# Patient Record
Sex: Male | Born: 1965 | Race: Black or African American | Hispanic: No | Smoking: Never smoker
Health system: Southern US, Community
[De-identification: ages and names within clinical notes are randomized; demographics above are authoritative.]

## PROBLEM LIST (undated history)

## (undated) ENCOUNTER — Emergency Department (HOSPITAL_COMMUNITY): Admission: EM | Payer: Medicaid Other

## (undated) DIAGNOSIS — G8929 Other chronic pain: Secondary | ICD-10-CM

## (undated) DIAGNOSIS — R0600 Dyspnea, unspecified: Secondary | ICD-10-CM

## (undated) DIAGNOSIS — I2510 Atherosclerotic heart disease of native coronary artery without angina pectoris: Secondary | ICD-10-CM

## (undated) DIAGNOSIS — I10 Essential (primary) hypertension: Secondary | ICD-10-CM

## (undated) DIAGNOSIS — F419 Anxiety disorder, unspecified: Secondary | ICD-10-CM

## (undated) DIAGNOSIS — M549 Dorsalgia, unspecified: Secondary | ICD-10-CM

## (undated) DIAGNOSIS — R519 Headache, unspecified: Secondary | ICD-10-CM

## (undated) DIAGNOSIS — E119 Type 2 diabetes mellitus without complications: Secondary | ICD-10-CM

## (undated) DIAGNOSIS — I209 Angina pectoris, unspecified: Secondary | ICD-10-CM

## (undated) DIAGNOSIS — M1990 Unspecified osteoarthritis, unspecified site: Secondary | ICD-10-CM

## (undated) DIAGNOSIS — G709 Myoneural disorder, unspecified: Secondary | ICD-10-CM

## (undated) HISTORY — PX: TONSILLECTOMY: SUR1361

## (undated) HISTORY — PX: SHOULDER SURGERY: SHX246

## (undated) HISTORY — PX: BACK SURGERY: SHX140

## (undated) HISTORY — DX: Essential (primary) hypertension: I10

## (undated) HISTORY — PX: CARDIAC CATHETERIZATION: SHX172

---

## 2003-11-04 ENCOUNTER — Encounter: Admission: RE | Admit: 2003-11-04 | Discharge: 2003-11-04 | Payer: Self-pay | Admitting: Neurosurgery

## 2003-11-25 ENCOUNTER — Encounter: Admission: RE | Admit: 2003-11-25 | Discharge: 2003-11-25 | Payer: Self-pay | Admitting: Neurosurgery

## 2003-12-30 ENCOUNTER — Inpatient Hospital Stay (HOSPITAL_COMMUNITY): Admission: RE | Admit: 2003-12-30 | Discharge: 2003-12-30 | Payer: Self-pay | Admitting: Neurosurgery

## 2004-01-26 ENCOUNTER — Inpatient Hospital Stay (HOSPITAL_COMMUNITY): Admission: RE | Admit: 2004-01-26 | Discharge: 2004-01-29 | Payer: Self-pay | Admitting: Neurosurgery

## 2004-03-07 ENCOUNTER — Encounter: Admission: RE | Admit: 2004-03-07 | Discharge: 2004-03-07 | Payer: Self-pay | Admitting: Neurosurgery

## 2004-04-18 ENCOUNTER — Encounter: Admission: RE | Admit: 2004-04-18 | Discharge: 2004-04-18 | Payer: Self-pay | Admitting: Neurosurgery

## 2004-08-29 ENCOUNTER — Encounter: Admission: RE | Admit: 2004-08-29 | Discharge: 2004-08-29 | Payer: Self-pay | Admitting: Neurosurgery

## 2005-05-07 ENCOUNTER — Emergency Department (HOSPITAL_COMMUNITY): Admission: EM | Admit: 2005-05-07 | Discharge: 2005-05-07 | Payer: Self-pay | Admitting: Family Medicine

## 2018-06-28 ENCOUNTER — Emergency Department (HOSPITAL_COMMUNITY)
Admission: EM | Admit: 2018-06-28 | Discharge: 2018-06-28 | Disposition: A | Discharge status: Home / Self Care | Payer: Self-pay | Attending: Emergency Medicine | Admitting: Emergency Medicine

## 2018-06-28 ENCOUNTER — Encounter (HOSPITAL_COMMUNITY): Payer: Self-pay | Admitting: Emergency Medicine

## 2018-06-28 ENCOUNTER — Other Ambulatory Visit: Payer: Self-pay

## 2018-06-28 ENCOUNTER — Emergency Department (HOSPITAL_COMMUNITY): Payer: Self-pay

## 2018-06-28 DIAGNOSIS — B9789 Other viral agents as the cause of diseases classified elsewhere: Secondary | ICD-10-CM

## 2018-06-28 DIAGNOSIS — J069 Acute upper respiratory infection, unspecified: Secondary | ICD-10-CM | POA: Insufficient documentation

## 2018-06-28 DIAGNOSIS — I2510 Atherosclerotic heart disease of native coronary artery without angina pectoris: Secondary | ICD-10-CM | POA: Insufficient documentation

## 2018-06-28 DIAGNOSIS — E119 Type 2 diabetes mellitus without complications: Secondary | ICD-10-CM | POA: Insufficient documentation

## 2018-06-28 HISTORY — DX: Type 2 diabetes mellitus without complications: E11.9

## 2018-06-28 HISTORY — DX: Atherosclerotic heart disease of native coronary artery without angina pectoris: I25.10

## 2018-06-28 LAB — CBC WITH DIFFERENTIAL/PLATELET
Abs Immature Granulocytes: 0.03 10*3/uL (ref 0.00–0.07)
Basophils Absolute: 0 10*3/uL (ref 0.0–0.1)
Basophils Relative: 0 %
Eosinophils Absolute: 0.1 10*3/uL (ref 0.0–0.5)
Eosinophils Relative: 1 %
HCT: 44.8 % (ref 39.0–52.0)
Hemoglobin: 15.7 g/dL (ref 13.0–17.0)
Immature Granulocytes: 0 %
Lymphocytes Relative: 31 %
Lymphs Abs: 3.3 10*3/uL (ref 0.7–4.0)
MCH: 31.5 pg (ref 26.0–34.0)
MCHC: 35 g/dL (ref 30.0–36.0)
MCV: 90 fL (ref 80.0–100.0)
Monocytes Absolute: 0.7 10*3/uL (ref 0.1–1.0)
Monocytes Relative: 6 %
Neutro Abs: 6.5 10*3/uL (ref 1.7–7.7)
Neutrophils Relative %: 62 %
Platelets: 364 10*3/uL (ref 150–400)
RBC: 4.98 MIL/uL (ref 4.22–5.81)
RDW: 13.2 % (ref 11.5–15.5)
WBC: 10.6 10*3/uL — ABNORMAL HIGH (ref 4.0–10.5)
nRBC: 0 % (ref 0.0–0.2)

## 2018-06-28 LAB — BASIC METABOLIC PANEL
Anion gap: 5 (ref 5–15)
BUN: 7 mg/dL (ref 6–20)
CO2: 26 mmol/L (ref 22–32)
Calcium: 9.4 mg/dL (ref 8.9–10.3)
Chloride: 106 mmol/L (ref 98–111)
Creatinine, Ser: 0.67 mg/dL (ref 0.61–1.24)
GFR calc Af Amer: >60 mL/min (ref >60)
GFR calc non Af Amer: >60 mL/min (ref >60)
Glucose, Bld: 103 mg/dL — ABNORMAL HIGH (ref 70–99)
Potassium: 3.9 mmol/L (ref 3.5–5.1)
Sodium: 137 mmol/L (ref 135–145)

## 2018-06-28 LAB — INFLUENZA PANEL BY PCR (TYPE A & B)
Influenza A By PCR: NEGATIVE
Influenza B By PCR: NEGATIVE

## 2018-06-28 LAB — CBG MONITORING, ED: Glucose-Capillary: 95 mg/dL (ref 70–99)

## 2018-06-28 NOTE — ED Notes (Addendum)
Spoke with [facility] to confirm why flu swab results were delayed. Lab personnel sts "well its the weekend and theres only one person down here." -- lab specimen was tubed to lab at 9:31. Vernona Rieger, PA made aware.

## 2018-06-28 NOTE — ED Provider Notes (Signed)
[facility] EMERGENCY DEPARTMENT Provider Note   CSN: [id number] Arrival date & time: 06/28/18  0909     History   Chief Complaint Chief Complaint  Patient presents with  . Cough    HPI Scott Tucker is a 53 y.o. male.  53 year old male brought in by significant other for cough x1 month, reports intermittent fevers for the past week with temp max 101.  Also reports chills and body aches.  Patient's wife was sick with a cold a month ago otherwise no known sick contacts.  Patient has a history of hypertension, is not compliant with medication, history of diabetes, last blood sugar check was this morning at 130.  Patient states that he feels short of breath after prolonged coughing episode otherwise denies shortness of breath or wheezing, denies nausea, vomiting, changes in bowel or bladder habits.  Patient has been taking OTC cold medication with limited relief.  No other complaints or concerns.  Patient denies tobacco use, does smoke marijuana, does not vape.     Past Medical History:  Diagnosis Date  . Coronary artery disease   . Diabetes mellitus without complication (HCC)     There are no active problems to display for this patient.   Past Surgical History:  Procedure Laterality Date  . BACK SURGERY    . SHOULDER SURGERY          Home Medications    Prior to Admission medications   Not on File    Family History No family history on file.  Social History Social History   Tobacco Use  . Smoking status: Never Smoker  Substance Use Topics  . Alcohol use: Yes    Comment: occasional  . Drug use: Yes    Types: Marijuana     Allergies   Patient has no known allergies.   Review of Systems Review of Systems  Constitutional: Positive for chills and fever.  HENT: Negative for congestion and sore throat.   Respiratory: Positive for cough. Negative for shortness of breath and wheezing.   Cardiovascular: Negative for chest pain.   Gastrointestinal: Negative for abdominal pain.  Musculoskeletal: Positive for arthralgias and myalgias.  Skin: Negative for rash and wound.  Allergic/Immunologic: Positive for immunocompromised state.  Hematological: Negative for adenopathy. Does not bruise/bleed easily.  Psychiatric/Behavioral: Negative for confusion.  All other systems reviewed and are negative.    Physical Exam Updated Vital Signs BP (!) 140/96 (BP Location: Right Arm)   Pulse 76   Temp 98.3 F (36.8 C) (Oral)   Resp 20   SpO2 99%   Physical Exam Vitals signs and nursing note reviewed.  Constitutional:      General: He is not in acute distress.    Appearance: He is well-developed. He is not diaphoretic.  HENT:     Head: Normocephalic and atraumatic.     Right Ear: Tympanic membrane and ear canal normal.     Left Ear: Tympanic membrane and ear canal normal.     Nose: Nose normal.     Mouth/Throat:     Mouth: Mucous membranes are moist.  Eyes:     Conjunctiva/sclera: Conjunctivae normal.  Cardiovascular:     Rate and Rhythm: Normal rate and regular rhythm.     Pulses: Normal pulses.     Heart sounds: Normal heart sounds.  Pulmonary:     Effort: Pulmonary effort is normal.     Breath sounds: Normal breath sounds. No wheezing.  Skin:    General:  Skin is warm and dry.     Findings: No erythema or rash.  Neurological:     Mental Status: He is alert and oriented to person, place, and time.  Psychiatric:        Behavior: Behavior normal.      ED Treatments / Results  Labs (all labs ordered are listed, but only abnormal results are displayed) Labs Reviewed  BASIC METABOLIC PANEL - Abnormal; Notable for the following components:      Result Value   Glucose, Bld 103 (*)    All other components within normal limits  CBC WITH DIFFERENTIAL/PLATELET - Abnormal; Notable for the following components:   WBC 10.6 (*)    All other components within normal limits  INFLUENZA PANEL BY PCR (TYPE A & B)   CBG MONITORING, ED    EKG None  Radiology Dg Chest 2 View  Result Date: 06/28/2018 CLINICAL DATA:  Shortness of breath and cough EXAM: CHEST - 2 VIEW COMPARISON:  December 27, 2003 FINDINGS: Lungs are clear. Heart size and pulmonary vascularity are normal. No adenopathy. There are foci of degenerative change in the thoracic spine. IMPRESSION: No edema or consolidation. Electronically Signed   By: Bretta Bang III M.D.   On: 06/28/2018 11:53    Procedures Procedures (including critical care time)  Medications Ordered in ED Medications - No data to display   Initial Impression / Assessment and Plan / ED Course  I have reviewed the triage vital signs and the nursing notes.  Pertinent labs & imaging results that were available during my care of the patient were reviewed by me and considered in my medical decision making (see chart for details).  Clinical Course as of Jun 29 1519  Sun Jun 28, 2018  5342 53 year old male presents with complaint of cough x1 month with intermittent fevers with max temp of 101.  Also reports chills and body aches developing over the past week.  On exam patient appears to feel unwell, lung sounds are clear.  Chest x-ray is unremarkable, flu swab is negative. Due to complaint of cough x 1 month with intermittent fevers, recommends labs (CBC and BMP). Lab delay on flu swab resulted in prolonged ER visit, patient is agreeable to lab draw but does not want to stay fore results. Patient recently moved to the area, PCP is in [place]. Advised patient to follow up, given local PCP.    [LM]  1520 Contacted patient, identity verified and given his lab results.  Again recommend he follow-up with local PCP.  Patient verbalizes understanding.   [LM]    Clinical Course User Index [LM] Jeannie Fend, PA-C   Final Clinical Impressions(s) / ED Diagnoses   Final diagnoses:  Viral URI with cough    ED Discharge Orders    None       Jeannie Fend,  PA-C 06/28/18 1521    Virgina Norfolk, DO 06/28/18 1523

## 2018-06-28 NOTE — ED Notes (Signed)
Patient transported to X-ray 

## 2018-06-28 NOTE — Discharge Instructions (Addendum)
Home to rest.  Take Coricidin HBP for cough symptoms.  Follow-up with primary care provider, referral given for local primary care if needed.

## 2018-06-28 NOTE — ED Notes (Signed)
Patient verbalizes understanding of discharge instructions. Opportunity for questioning and answers were provided. Armband removed by staff, pt discharged from ED.  

## 2018-06-28 NOTE — ED Triage Notes (Signed)
Pt reports cough that is sometimes productive, reports some post nasal drip as well x2 weeks. Denies any other associated symptoms. Taking some cold/allergy medicine without relief.

## 2018-08-03 ENCOUNTER — Other Ambulatory Visit: Payer: Self-pay

## 2018-08-03 ENCOUNTER — Emergency Department (HOSPITAL_COMMUNITY)
Admission: EM | Admit: 2018-08-03 | Discharge: 2018-08-03 | Disposition: A | Discharge status: Home / Self Care | Payer: Self-pay | Attending: Emergency Medicine | Admitting: Emergency Medicine

## 2018-08-03 ENCOUNTER — Encounter (HOSPITAL_COMMUNITY): Payer: Self-pay | Admitting: Emergency Medicine

## 2018-08-03 ENCOUNTER — Emergency Department (HOSPITAL_COMMUNITY): Payer: Self-pay

## 2018-08-03 DIAGNOSIS — R059 Cough, unspecified: Secondary | ICD-10-CM

## 2018-08-03 DIAGNOSIS — R05 Cough: Secondary | ICD-10-CM | POA: Insufficient documentation

## 2018-08-03 DIAGNOSIS — R079 Chest pain, unspecified: Secondary | ICD-10-CM | POA: Insufficient documentation

## 2018-08-03 DIAGNOSIS — E119 Type 2 diabetes mellitus without complications: Secondary | ICD-10-CM | POA: Insufficient documentation

## 2018-08-03 DIAGNOSIS — R0789 Other chest pain: Secondary | ICD-10-CM

## 2018-08-03 HISTORY — DX: Dorsalgia, unspecified: M54.9

## 2018-08-03 HISTORY — DX: Other chronic pain: G89.29

## 2018-08-03 LAB — CBG MONITORING, ED
Glucose-Capillary: 94 mg/dL (ref 70–99)
Glucose-Capillary: 99 mg/dL (ref 70–99)

## 2018-08-03 MED ORDER — SODIUM CHLORIDE 0.9% FLUSH: 3.0000 mL | Freq: Once | INTRAVENOUS | Status: DC

## 2018-08-03 NOTE — Discharge Instructions (Signed)
Please follow up with your doctor °Return if worsening °

## 2018-08-03 NOTE — ED Provider Notes (Signed)
[facility] EMERGENCY DEPARTMENT Provider Note   CSN: [id number] Arrival date & time: 08/03/18  1447    History   Chief Complaint Chief Complaint  Patient presents with  . Cough  . Chest Pain    HPI Scott Tucker is a 53 y.o. male presents with a cough and chest pain.  Past medical history significant for coronary artery disease, insulin-dependent diabetes, hypertension, hyperlipidemia, chronic back pain.  The patient recently moved here and has been receiving care in [place].  He states that he has had a chronic cough for the past 3 months.  It is mostly dry but productive at times.  He was seen here in the ED last month for the same.  He was told that he likely had a viral illness.  He had normal labs and a chest x-ray and was told to follow-up with his PCP which he has not done.  He states he possibly had a fever couple weeks ago but states it is only 99 degrees.  He has not had any URI symptoms.  He denies constant chest pain or exertional chest pain. The pain does not feel like anginal pain which has had in the past. He denies shortness of breath or leg swelling.  He mostly has pain when he coughs or takes deep breaths which concerned him.  He called his family doctor's office and they told him to come to the ED. He does take Lisinopril/hctz.     HPI  Past Medical History:  Diagnosis Date  . Coronary artery disease   . Diabetes mellitus without complication (HCC)     There are no active problems to display for this patient.   Past Surgical History:  Procedure Laterality Date  . BACK SURGERY    . SHOULDER SURGERY          Home Medications    Prior to Admission medications   Not on File    Family History No family history on file.  Social History Social History   Tobacco Use  . Smoking status: Never Smoker  . Smokeless tobacco: Never Used  Substance Use Topics  . Alcohol use: Yes    Comment: occasional  . Drug use: Yes    Types:  Marijuana     Allergies   Patient has no known allergies.   Review of Systems Review of Systems  Constitutional: Negative for chills and fever.  HENT: Negative for congestion, rhinorrhea and sore throat.   Respiratory: Positive for cough. Negative for shortness of breath.   Cardiovascular: Positive for chest pain (with coughing). Negative for palpitations and leg swelling.  Musculoskeletal: Positive for back pain (chronic) and myalgias.  All other systems reviewed and are negative.    Physical Exam Updated Vital Signs BP (!) 131/91 (BP Location: Right Arm)   Pulse 88   Temp 98.2 F (36.8 C) (Oral)   Resp 16   SpO2 98%   Physical Exam Vitals signs and nursing note reviewed.  Constitutional:      General: He is not in acute distress.    Appearance: He is well-developed. He is not ill-appearing.     Comments: Calm, cooperative.  Well-appearing  HENT:     Head: Normocephalic and atraumatic.     Right Ear: Tympanic membrane normal.     Left Ear: Tympanic membrane normal.     Nose: Nose normal.     Mouth/Throat:     Mouth: Mucous membranes are moist.  Eyes:  General: No scleral icterus.       Right eye: No discharge.        Left eye: No discharge.     Conjunctiva/sclera: Conjunctivae normal.     Pupils: Pupils are equal, round, and reactive to light.  Neck:     Musculoskeletal: Normal range of motion.  Cardiovascular:     Rate and Rhythm: Normal rate and regular rhythm.  Pulmonary:     Effort: Pulmonary effort is normal. No respiratory distress.     Breath sounds: Normal breath sounds.  Abdominal:     General: There is no distension.  Musculoskeletal:     Right lower leg: No edema.     Left lower leg: No edema.  Skin:    General: Skin is warm and dry.  Neurological:     Mental Status: He is alert and oriented to person, place, and time.  Psychiatric:        Behavior: Behavior normal.      ED Treatments / Results  Labs (all labs ordered are listed,  but only abnormal results are displayed) Labs Reviewed  CBG MONITORING, ED  CBG MONITORING, ED    EKG None  Radiology No results found.  Procedures Procedures (including critical care time)  Medications Ordered in ED Medications - No data to display   Initial Impression / Assessment and Plan / ED Course  I have reviewed the triage vital signs and the nursing notes.  Pertinent labs & imaging results that were available during my care of the patient were reviewed by me and considered in my medical decision making (see chart for details).  53 year old male presents with a chronic dry cough, chest pain with coughing and deep breaths.  His vital signs are normal here.  He is overall well-appearing but uncomfortable due to the cough.  EKG is sinus rhythm and has abnormalities but it appears similar to prior EKGs.  Chest x-ray is normal.  CBG here is normal.  I think it is possible that his chronic cough is due to lisinopril.  Advised following up with his doctor about this and possibly changing the medicine.  Final Clinical Impressions(s) / ED Diagnoses   Final diagnoses:  Cough  Chest wall pain    ED Discharge Orders    None       Bethel Born, PA-C 08/03/18 1657    Blane Ohara, MD 08/03/18 [phone]

## 2018-08-03 NOTE — ED Triage Notes (Signed)
Pt c/o cough x 3 months, chest pain developed with cough and lying flat x 2 weeks ago. Denies fever/shortness of breath.

## 2019-02-22 ENCOUNTER — Other Ambulatory Visit: Payer: Self-pay

## 2019-02-22 ENCOUNTER — Observation Stay (HOSPITAL_COMMUNITY)
Admission: EM | Admit: 2019-02-22 | Discharge: 2019-02-23 | Disposition: A | Discharge status: Home / Self Care | Payer: Self-pay | Attending: Interventional Cardiology | Admitting: Interventional Cardiology

## 2019-02-22 ENCOUNTER — Observation Stay (HOSPITAL_COMMUNITY): Payer: Self-pay

## 2019-02-22 ENCOUNTER — Observation Stay (HOSPITAL_BASED_OUTPATIENT_CLINIC_OR_DEPARTMENT_OTHER): Payer: Self-pay

## 2019-02-22 ENCOUNTER — Encounter (HOSPITAL_COMMUNITY): Payer: Self-pay | Admitting: Emergency Medicine

## 2019-02-22 ENCOUNTER — Emergency Department (HOSPITAL_COMMUNITY): Payer: Self-pay

## 2019-02-22 DIAGNOSIS — R079 Chest pain, unspecified: Secondary | ICD-10-CM | POA: Diagnosis present

## 2019-02-22 DIAGNOSIS — R9431 Abnormal electrocardiogram [ECG] [EKG]: Secondary | ICD-10-CM

## 2019-02-22 DIAGNOSIS — E785 Hyperlipidemia, unspecified: Secondary | ICD-10-CM

## 2019-02-22 DIAGNOSIS — E875 Hyperkalemia: Secondary | ICD-10-CM | POA: Insufficient documentation

## 2019-02-22 DIAGNOSIS — E119 Type 2 diabetes mellitus without complications: Secondary | ICD-10-CM | POA: Insufficient documentation

## 2019-02-22 DIAGNOSIS — Z20828 Contact with and (suspected) exposure to other viral communicable diseases: Secondary | ICD-10-CM | POA: Insufficient documentation

## 2019-02-22 DIAGNOSIS — I25110 Atherosclerotic heart disease of native coronary artery with unstable angina pectoris: Secondary | ICD-10-CM

## 2019-02-22 DIAGNOSIS — Z8571 Personal history of Hodgkin lymphoma: Secondary | ICD-10-CM | POA: Insufficient documentation

## 2019-02-22 DIAGNOSIS — R0789 Other chest pain: Principal | ICD-10-CM | POA: Insufficient documentation

## 2019-02-22 DIAGNOSIS — Z794 Long term (current) use of insulin: Secondary | ICD-10-CM | POA: Insufficient documentation

## 2019-02-22 DIAGNOSIS — I2510 Atherosclerotic heart disease of native coronary artery without angina pectoris: Secondary | ICD-10-CM | POA: Insufficient documentation

## 2019-02-22 DIAGNOSIS — I10 Essential (primary) hypertension: Secondary | ICD-10-CM

## 2019-02-22 DIAGNOSIS — R05 Cough: Secondary | ICD-10-CM

## 2019-02-22 DIAGNOSIS — R059 Cough, unspecified: Secondary | ICD-10-CM

## 2019-02-22 DIAGNOSIS — I119 Hypertensive heart disease without heart failure: Secondary | ICD-10-CM | POA: Insufficient documentation

## 2019-02-22 LAB — BASIC METABOLIC PANEL
Anion gap: 10 (ref 5–15)
BUN: 14 mg/dL (ref 6–20)
CO2: 25 mmol/L (ref 22–32)
Calcium: 9.3 mg/dL (ref 8.9–10.3)
Chloride: 105 mmol/L (ref 98–111)
Creatinine, Ser: 0.67 mg/dL (ref 0.61–1.24)
GFR calc Af Amer: >60 mL/min (ref >60)
GFR calc non Af Amer: >60 mL/min (ref >60)
Glucose, Bld: 99 mg/dL (ref 70–99)
Potassium: 5.9 mmol/L — ABNORMAL HIGH (ref 3.5–5.1)
Sodium: 140 mmol/L (ref 135–145)

## 2019-02-22 LAB — SARS CORONAVIRUS 2 BY RT PCR (HOSPITAL ORDER, PERFORMED IN ~~LOC~~ HOSPITAL LAB): SARS Coronavirus 2: NEGATIVE

## 2019-02-22 LAB — CBC
HCT: 44.8 % (ref 39.0–52.0)
Hemoglobin: 15.4 g/dL (ref 13.0–17.0)
MCH: 31.9 pg (ref 26.0–34.0)
MCHC: 34.4 g/dL (ref 30.0–36.0)
MCV: 92.8 fL (ref 80.0–100.0)
Platelets: 359 10*3/uL (ref 150–400)
RBC: 4.83 MIL/uL (ref 4.22–5.81)
RDW: 13.2 % (ref 11.5–15.5)
WBC: 10.1 10*3/uL (ref 4.0–10.5)
nRBC: 0 % (ref 0.0–0.2)

## 2019-02-22 LAB — TROPONIN I (HIGH SENSITIVITY)
Troponin I (High Sensitivity): 3 ng/L (ref <18)
Troponin I (High Sensitivity): 3 ng/L (ref <18)

## 2019-02-22 LAB — ECHOCARDIOGRAM COMPLETE

## 2019-02-22 LAB — CBG MONITORING, ED: Glucose-Capillary: 89 mg/dL (ref 70–99)

## 2019-02-22 LAB — HIV ANTIBODY (ROUTINE TESTING W REFLEX): HIV Screen 4th Generation wRfx: NONREACTIVE

## 2019-02-22 MED ORDER — IOHEXOL 350 MG/ML SOLN
80.0000 mL | Freq: Once | INTRAVENOUS | Status: AC | PRN
  Administered 2019-02-22: 80 mL via INTRAVENOUS

## 2019-02-22 MED ORDER — METOPROLOL TARTRATE 5 MG/5ML IV SOLN
INTRAVENOUS | Status: AC
  Filled 2019-02-22: qty 5

## 2019-02-22 MED ORDER — ATORVASTATIN CALCIUM 40 MG PO TABS
40.0000 mg | ORAL_TABLET | Freq: Every day | ORAL | Status: DC
  Administered 2019-02-22: 40 mg via ORAL
  Filled 2019-02-22: qty 1

## 2019-02-22 MED ORDER — HYDROCHLOROTHIAZIDE 12.5 MG PO CAPS: 12.5000 mg | ORAL_CAPSULE | Freq: Every day | ORAL | Status: DC

## 2019-02-22 MED ORDER — ONDANSETRON HCL 4 MG/2ML IJ SOLN: 4.0000 mg | Freq: Four times a day (QID) | INTRAMUSCULAR | Status: DC | PRN

## 2019-02-22 MED ORDER — ASPIRIN EC 81 MG PO TBEC
81.0000 mg | DELAYED_RELEASE_TABLET | Freq: Every day | ORAL | Status: DC
  Administered 2019-02-23: 81 mg via ORAL
  Filled 2019-02-22: qty 1

## 2019-02-22 MED ORDER — NITROGLYCERIN 0.4 MG SL SUBL: 0.4000 mg | SUBLINGUAL_TABLET | SUBLINGUAL | Status: DC | PRN

## 2019-02-22 MED ORDER — SODIUM CHLORIDE 0.9% FLUSH
3.0000 mL | Freq: Once | INTRAVENOUS | Status: AC
  Administered 2019-02-22: 3 mL via INTRAVENOUS

## 2019-02-22 MED ORDER — ASPIRIN 81 MG PO CHEW
324.0000 mg | CHEWABLE_TABLET | Freq: Once | ORAL | Status: AC
  Administered 2019-02-22: 11:00:00 324 mg via ORAL

## 2019-02-22 MED ORDER — SODIUM CHLORIDE 0.9 % WEIGHT BASED INFUSION: 3.0000 mL/kg/h | INTRAVENOUS | Status: AC

## 2019-02-22 MED ORDER — NON FORMULARY: 5.0000 mg | Freq: Every evening | Status: DC | PRN

## 2019-02-22 MED ORDER — LISINOPRIL 10 MG PO TABS: 10.0000 mg | ORAL_TABLET | Freq: Every day | ORAL | Status: DC

## 2019-02-22 MED ORDER — NITROGLYCERIN 0.4 MG SL SUBL
SUBLINGUAL_TABLET | SUBLINGUAL | Status: AC
  Filled 2019-02-22: qty 2

## 2019-02-22 MED ORDER — MELATONIN 3 MG PO TABS
4.5000 mg | ORAL_TABLET | Freq: Every evening | ORAL | Status: DC | PRN
  Administered 2019-02-22: 4.5 mg via ORAL
  Filled 2019-02-22 (×2): qty 1.5

## 2019-02-22 MED ORDER — SODIUM CHLORIDE 0.9% FLUSH
3.0000 mL | Freq: Two times a day (BID) | INTRAVENOUS | Status: DC
  Administered 2019-02-22 – 2019-02-23 (×2): 3 mL via INTRAVENOUS

## 2019-02-22 MED ORDER — MORPHINE SULFATE 15 MG PO TABS
15.0000 mg | ORAL_TABLET | ORAL | Status: DC | PRN
  Administered 2019-02-22 – 2019-02-23 (×3): 15 mg via ORAL
  Filled 2019-02-22 (×5): qty 1

## 2019-02-22 MED ORDER — SODIUM CHLORIDE 0.9 % WEIGHT BASED INFUSION
1.0000 mL/kg/h | INTRAVENOUS | Status: DC
  Administered 2019-02-23: 1 mL/kg/h via INTRAVENOUS

## 2019-02-22 MED ORDER — LISINOPRIL-HYDROCHLOROTHIAZIDE 10-12.5 MG PO TABS: 1.0000 | ORAL_TABLET | Freq: Every day | ORAL | Status: DC

## 2019-02-22 MED ORDER — SODIUM CHLORIDE 0.9% FLUSH: 3.0000 mL | INTRAVENOUS | Status: DC | PRN

## 2019-02-22 MED ORDER — METOPROLOL TARTRATE 50 MG PO TABS: 50.0000 mg | ORAL_TABLET | Freq: Once | ORAL | Status: DC

## 2019-02-22 MED ORDER — SODIUM CHLORIDE 0.9 % IV SOLN: 250.0000 mL | INTRAVENOUS | Status: DC | PRN

## 2019-02-22 MED ORDER — ACETAMINOPHEN 325 MG PO TABS
650.0000 mg | ORAL_TABLET | ORAL | Status: DC | PRN
  Administered 2019-02-22: 650 mg via ORAL
  Filled 2019-02-22: qty 2

## 2019-02-22 MED ORDER — ASPIRIN 81 MG PO CHEW
81.0000 mg | CHEWABLE_TABLET | ORAL | Status: AC
  Administered 2019-02-23: 81 mg via ORAL
  Filled 2019-02-22: qty 1

## 2019-02-22 NOTE — ED Triage Notes (Signed)
Pt reports pulsating CP for the last 2 days. Denies radiation of pain. Denies hx of MI. Reports wheezing at night. Smoker. Denies sick contacts, fever.

## 2019-02-22 NOTE — Progress Notes (Signed)
  Echocardiogram 2D Echocardiogram has been performed.  Scott Tucker 02/22/2019, 4:37 PM

## 2019-02-22 NOTE — ED Notes (Signed)
ED Provider at bedside. 

## 2019-02-22 NOTE — ED Provider Notes (Signed)
[facility] EMERGENCY DEPARTMENT Provider Note   CSN: [id number] Arrival date & time: 02/22/19  2423     History   Chief Complaint Chief Complaint  Patient presents with  . Chest Pain  . Cough    HPI Scott Tucker is a 53 y.o. male.     Patient with history of coronary artery disease per his report 80% blockage with balloon procedure approximately 10 years ago, supposed to be on Plavix however ran out 3 weeks ago, diabetes presents with intermittent chest pain lasting a few minutes at a time typically at rest.  Patient has not taken nitro recently.  Patient is also had productive cough.  No fevers or chills or sick contacts.  Currently no active chest pain comes and goes.  Patient does smoke marijuana.  No history of stent placement.  No current cardiologist.     Past Medical History:  Diagnosis Date  . Chronic back pain   . Coronary artery disease   . Diabetes mellitus without complication ([place])     There are no active problems to display for this patient.   Past Surgical History:  Procedure Laterality Date  . BACK SURGERY    . SHOULDER SURGERY          Home Medications    Prior to Admission medications   Not on File    Family History History reviewed. No pertinent family history.  Social History Social History   Tobacco Use  . Smoking status: Never Smoker  . Smokeless tobacco: Never Used  Substance Use Topics  . Alcohol use: Yes    Comment: occasional  . Drug use: Yes    Types: Marijuana     Allergies   Patient has no known allergies.   Review of Systems Review of Systems  Constitutional: Negative for chills and fever.  HENT: Negative for congestion.   Eyes: Negative for visual disturbance.  Respiratory: Positive for cough and shortness of breath.   Cardiovascular: Positive for chest pain. Negative for leg swelling.  Gastrointestinal: Negative for abdominal pain and vomiting.  Genitourinary: Negative for dysuria  and flank pain.  Musculoskeletal: Negative for back pain, neck pain and neck stiffness.  Skin: Negative for rash.  Neurological: Negative for light-headedness and headaches.     Physical Exam Updated Vital Signs BP (!) 148/95   Pulse (!) 25   Temp 98.8 F (37.1 C) (Oral)   Resp 14   SpO2 96%   Physical Exam Vitals signs and nursing note reviewed.  Constitutional:      Appearance: He is well-developed.  HENT:     Head: Normocephalic and atraumatic.  Eyes:     General:        Right eye: No discharge.        Left eye: No discharge.     Conjunctiva/sclera: Conjunctivae normal.  Neck:     Musculoskeletal: Normal range of motion and neck supple.     Trachea: No tracheal deviation.  Cardiovascular:     Rate and Rhythm: Normal rate and regular rhythm.  Pulmonary:     Effort: Pulmonary effort is normal.     Breath sounds: Normal breath sounds.  Abdominal:     General: There is no distension.     Palpations: Abdomen is soft.     Tenderness: There is no abdominal tenderness. There is no guarding.  Skin:    General: Skin is warm.     Findings: No rash.  Neurological:  Mental Status: He is alert and oriented to person, place, and time.      ED Treatments / Results  Labs (all labs ordered are listed, but only abnormal results are displayed) Labs Reviewed  BASIC METABOLIC PANEL - Abnormal; Notable for the following components:      Result Value   Potassium 5.9 (*)    All other components within normal limits  SARS CORONAVIRUS 2 BY RT PCR (HOSPITAL ORDER, PERFORMED IN [facility])  CBC  CBG MONITORING, ED  TROPONIN I (HIGH SENSITIVITY)  TROPONIN I (HIGH SENSITIVITY)    EKG EKG Interpretation  Date/Time:  Monday February 22 2019 10:10:38 EDT Ventricular Rate:  78 PR Interval:  148 QRS Duration: 100 QT Interval:  360 QTC Calculation: 410 R Axis:   -48 Text Interpretation:  Normal sinus rhythm Pulmonary disease pattern Incomplete right bundle  branch block Left anterior fascicular block Moderate voltage criteria for LVH, may be normal variant ( R in aVL , Cornell product ) Septal infarct , age undetermined Lateral injury pattern Confirmed by Blane Ohara [phone]) on 02/22/2019 10:17:03 AM   Radiology Dg Chest Portable 1 View  Result Date: 02/22/2019 CLINICAL DATA:  Chest pain for 2 days, cough, EXAM: PORTABLE CHEST 1 VIEW COMPARISON:  08/03/2018 FINDINGS: The heart size and mediastinal contours are within normal limits. Both lungs are clear. The visualized skeletal structures are unremarkable. IMPRESSION: No active disease. Electronically Signed   By: Donzetta Kohut M.D.   On: 02/22/2019 10:55    Procedures Procedures (including critical care time)  Medications Ordered in ED Medications  sodium chloride flush (NS) 0.9 % injection 3 mL (has no administration in time range)  aspirin chewable tablet 324 mg (324 mg Oral Given 02/22/19 1030)     Initial Impression / Assessment and Plan / ED Course  I have reviewed the triage vital signs and the nursing notes.  Pertinent labs & imaging results that were available during my care of the patient were reviewed by me and considered in my medical decision making (see chart for details).       Patient with history of coronary artery disease presents with almost 2 separate concerns.  Patient's had intermittent chest pain mild neck symptoms with it and also he said intermittent cough productive with wheezing.  EKG performed and concerning with signs of lateral and anterior strain with reciprocal pattern.  Old EKG has similar morphology and some of the leads however this is worse.  Paged cardiology to discuss further details.  Aspirin ordered.  No active chest pain right now. Discussed with cardiology Dr. Salena Saner will evaluate the patient and help with further management.  Patient chest pain-free in the ER.  Discussed with cardiology plan for admission.  General cardiac diet ordered as no  emergent cath planned.  Mild elevated potassium 5.9, normal troponin.  Chest x-ray no acute findings.   Final Clinical Impressions(s) / ED Diagnoses   Final diagnoses:  Abnormal EKG  Cough in adult    ED Discharge Orders    None       Blane Ohara, MD 02/22/19 1238

## 2019-02-22 NOTE — Plan of Care (Signed)
  Problem: Clinical Measurements: Goal: Cardiovascular complication will be avoided Outcome: Progressing   Problem: Activity: Goal: Risk for activity intolerance will decrease Outcome: Progressing   Problem: Pain Managment: Goal: General experience of comfort will improve Outcome: Progressing   Problem: Safety: Goal: Ability to remain free from injury will improve Outcome: Progressing   Problem: Skin Integrity: Goal: Risk for impaired skin integrity will decrease Outcome: Progressing   

## 2019-02-22 NOTE — Progress Notes (Signed)
Cardiac CTA was  Abnormal. I place cardiac cath orders, but did not place on board, will have rounding MD see and decide complete plan.  Pt aware.

## 2019-02-22 NOTE — Progress Notes (Signed)
MD on call notified patient requesting sleep medication. Arthor Captain LPN

## 2019-02-22 NOTE — H&P (Addendum)
H&P:   Patient ID: Scott Tucker MRN: [id number]; DOB: 09-20-1965  Admit date: 02/22/2019 Date of Consult: 02/22/2019  Primary Care Provider: System, Pcp Not In Primary Cardiologist: No primary care provider on file.  Primary Electrophysiologist:  None   Patient Profile:   Scott Tucker is a 53 y.o. male with a hx of possible CAD?, HTN, HL, DM who is being seen today for the evaluation of chest pain at the request of Dr. Reather Converse.  History of Present Illness:   Scott Tucker is a 53 yo male with PMH noted above.  He reports having a cardiac catheterization about 10 years ago possibly in [place].  He describes what sounds like an angioplasty, and was placed on aspirin and Plavix post cath.  He takes medications for hypertension, hyperlipidemia, diabetes and has a primary care provider in [place].  States he has not seen them in over a year since moving to [place].  Intermittently monitor his blood pressures at home which he reports being relatively controlled.  He is not currently taking any insulin and states his blood sugars have been relatively controlled as well.  He reports developing shortness of breath about a week ago.  Has also had associated cough that has been productive with dark mucus.  States he has had brief episodes of centralized chest pressure lasting 1 to 2 minutes at a time.  Has had 2 episodes of this over the past week.  Also reports intermittent episodes of fever temp max 101.  Has also had some right-sided neck and shoulder stiffness. Denies any sick contacts or Covid contacts.   In talking with the patient he states he is not very active at home.  Does cook and clean around his home and does not report any anginal symptoms with these activities.  In the ED his labs showed stable electrolytes with the exception of potassium of 5.9, hemoglobin 15.5, high-sensitivity troponin 3.  EKG showed sinus rhythm with LVH, T wave inversions in V5 V6 which was new from  prior EKG in March 2020.   Heart Pathway Score:     Past Medical History:  Diagnosis Date  . Chronic back pain   . Coronary artery disease   . Diabetes mellitus without complication [facility])     Past Surgical History:  Procedure Laterality Date  . BACK SURGERY    . SHOULDER SURGERY       Home Medications:  Prior to Admission medications   Not on File    Inpatient Medications: Scheduled Meds: . sodium chloride flush  3 mL Intravenous Once   Continuous Infusions:  PRN Meds:   Allergies:   No Known Allergies  Social History:   Social History   Socioeconomic History  . Marital status: Married    Spouse name: Not on file  . Number of children: Not on file  . Years of education: Not on file  . Highest education level: Not on file  Occupational History  . Not on file  Social Needs  . Financial resource strain: Not on file  . Food insecurity    Worry: Not on file    Inability: Not on file  . Transportation needs    Medical: Not on file    Non-medical: Not on file  Tobacco Use  . Smoking status: Never Smoker  . Smokeless tobacco: Never Used  Substance and Sexual Activity  . Alcohol use: Yes    Comment: occasional  . Drug use: Yes    Types: Marijuana  .  Sexual activity: Not on file  Lifestyle  . Physical activity    Days per week: Not on file    Minutes per session: Not on file  . Stress: Not on file  Relationships  . Social Musician on phone: Not on file    Gets together: Not on file    Attends religious service: Not on file    Active member of club or organization: Not on file    Attends meetings of clubs or organizations: Not on file    Relationship status: Not on file  . Intimate partner violence    Fear of current or ex partner: Not on file    Emotionally abused: Not on file    Physically abused: Not on file    Forced sexual activity: Not on file  Other Topics Concern  . Not on file  Social History Narrative  . Not on file     Family History:   History reviewed. No pertinent family history.   ROS:  Please see the history of present illness.   All other ROS reviewed and negative.     Physical Exam/Data:   Vitals:   02/22/19 1026 02/22/19 1136 02/22/19 1215 02/22/19 1315  BP: (!) 170/77 133/88 (!) 148/95 (!) 143/95  Pulse: 70 72 (!) 25 65  Resp: 20 (!) 22 14 16   Temp:      TempSrc:      SpO2: 99% 99% 96% 94%   No intake or output data in the 24 hours ending 02/22/19 1342 No flowsheet data found.   There is no height or weight on file to calculate BMI.  General:  Well nourished, well developed, in no acute distress HEENT: normal Neck: no JVD Endocrine:  No thryomegaly Vascular: No carotid bruits Cardiac:  normal S1, S2; RRR; no murmur  Lungs:  clear to auscultation bilaterally, no wheezing, rhonchi or rales  Abd: soft, nontender, no hepatomegaly  Ext: no edema Musculoskeletal:  No deformities, BUE and BLE strength normal and equal Skin: warm and dry  Neuro:  CNs 2-12 intact, no focal abnormalities noted Psych:  Normal affect   EKG:  The EKG was personally reviewed and demonstrates:  SR with LVH, new TWI in v5, v6.  Relevant CV Studies:  N/a   Laboratory Data:  High Sensitivity Troponin:   Recent Labs  Lab 02/22/19 1005 02/22/19 1205  TROPONINIHS 3 3     Chemistry Recent Labs  Lab 02/22/19 1005  NA 140  K 5.9*  CL 105  CO2 25  GLUCOSE 99  BUN 14  CREATININE 0.67  CALCIUM 9.3  GFRNONAA >60  GFRAA >60  ANIONGAP 10    No results for input(s): PROT, ALBUMIN, AST, ALT, ALKPHOS, BILITOT in the last 168 hours. Hematology Recent Labs  Lab 02/22/19 1005  WBC 10.1  RBC 4.83  HGB 15.4  HCT 44.8  MCV 92.8  MCH 31.9  MCHC 34.4  RDW 13.2  PLT 359   BNPNo results for input(s): BNP, PROBNP in the last 168 hours.  DDimer No results for input(s): DDIMER in the last 168 hours.   Radiology/Studies:  Dg Chest Portable 1 View  Result Date: 02/22/2019 CLINICAL DATA:  Chest  pain for 2 days, cough, EXAM: PORTABLE CHEST 1 VIEW COMPARISON:  08/03/2018 FINDINGS: The heart size and mediastinal contours are within normal limits. Both lungs are clear. The visualized skeletal structures are unremarkable. IMPRESSION: No active disease. Electronically Signed   By: 08/05/2018.D.  On: 02/22/2019 10:55    Assessment and Plan:   Ceasar LundBruce W Cudney is a 53 y.o. male with a hx of possible CAD?, HTN, HL, DM who is being seen today for the evaluation of chest pain at the request of Dr. Jodi MourningZavitz.  1. Chest pain: symptoms are atypical in nature, not associated with rest or exertion. Very brief in nature lasting only 1-2 mins at a time. No chest pain while in the ED. HsT negative x2. EKG showed SR with LVH but new TWI in v5-v6 compared to prior in 07/2018. Has also had a productive cough and fevers over the past week. No fever currently.  -- Will check echo for WMA and LVH.  -- would benefit from coronary CTA given possible hx of CAD. Will arrange for today.  2. Possible CAD: reports a cath about 10 years prior, describing angioplasty to vessel but no stenting. Has been on ASA/plavix since that time.   3. HTN: blood pressures are elevated in the ED, reporting he has been out of his home meds for quite sometime. Has not seen PCP in over a year since moving to [place]. List includes lisinopril/HCTZ -- will ask for Pharmacy to complete med rec of meds he has been on in the past.   4. HL: has been on statin in the past. Not currently as he ran out.  -- check lipids -- atorva 40mg  daily  5. DM: states his is insulin dependent but as above has been out. Says CBGs have been relatively controlled without meds. Glucose 99 on labs. -- check Hgb A11c  6. Hyperkalemia: K+ 5.9 in the ED. No agents prior to admission. Follow up BMET in am.  For questions or updates, please contact [facility] Please consult [email or web address] for contact info under   Signed, Laverda PageLindsay Roberts, NP   02/22/2019 1:42 PM   I have examined the patient and reviewed assessment and plan and discussed with patient.  Agree with above as stated.    Needs BP control.  CP with some atypical features.  Will check CTA coronaries.  Check echo to look for LVEF and LVH.  ECG changes are more consistent with LVH.   Will need f/u and compliance with meds to help modify risk factors.   Lance MussJayadeep Mahalia Dykes

## 2019-02-22 NOTE — ED Notes (Signed)
Zoll at bedside. Cards pending to bedside. Xray at bedside. Consent at bedside

## 2019-02-23 DIAGNOSIS — I25118 Atherosclerotic heart disease of native coronary artery with other forms of angina pectoris: Secondary | ICD-10-CM

## 2019-02-23 DIAGNOSIS — E785 Hyperlipidemia, unspecified: Secondary | ICD-10-CM

## 2019-02-23 DIAGNOSIS — E782 Mixed hyperlipidemia: Secondary | ICD-10-CM

## 2019-02-23 DIAGNOSIS — I10 Essential (primary) hypertension: Secondary | ICD-10-CM

## 2019-02-23 LAB — BASIC METABOLIC PANEL
Anion gap: 9 (ref 5–15)
BUN: 8 mg/dL (ref 6–20)
CO2: 27 mmol/L (ref 22–32)
Calcium: 9.1 mg/dL (ref 8.9–10.3)
Chloride: 105 mmol/L (ref 98–111)
Creatinine, Ser: 0.49 mg/dL — ABNORMAL LOW (ref 0.61–1.24)
GFR calc Af Amer: >60 mL/min (ref >60)
GFR calc non Af Amer: >60 mL/min (ref >60)
Glucose, Bld: 98 mg/dL (ref 70–99)
Potassium: 4.2 mmol/L (ref 3.5–5.1)
Sodium: 141 mmol/L (ref 135–145)

## 2019-02-23 LAB — LIPID PANEL
Cholesterol: 170 mg/dL (ref 0–200)
HDL: 46 mg/dL (ref >40)
LDL Cholesterol: 113 mg/dL — ABNORMAL HIGH (ref 0–99)
Total CHOL/HDL Ratio: 3.7 RATIO
Triglycerides: 56 mg/dL (ref <150)
VLDL: 11 mg/dL (ref 0–40)

## 2019-02-23 LAB — CBC
HCT: 41 % (ref 39.0–52.0)
Hemoglobin: 14.7 g/dL (ref 13.0–17.0)
MCH: 33.1 pg (ref 26.0–34.0)
MCHC: 35.9 g/dL (ref 30.0–36.0)
MCV: 92.3 fL (ref 80.0–100.0)
Platelets: 302 10*3/uL (ref 150–400)
RBC: 4.44 MIL/uL (ref 4.22–5.81)
RDW: 13.1 % (ref 11.5–15.5)
WBC: 10 10*3/uL (ref 4.0–10.5)
nRBC: 0 % (ref 0.0–0.2)

## 2019-02-23 LAB — GLUCOSE, CAPILLARY: Glucose-Capillary: 105 mg/dL — ABNORMAL HIGH (ref 70–99)

## 2019-02-23 LAB — HEMOGLOBIN A1C
Hgb A1c MFr Bld: 4.9 % (ref 4.8–5.6)
Mean Plasma Glucose: 93.93 mg/dL

## 2019-02-23 SURGERY — LEFT HEART CATH AND CORONARY ANGIOGRAPHY
Anesthesia: LOCAL

## 2019-02-23 MED ORDER — ATORVASTATIN CALCIUM 40 MG PO TABS: 40.0000 mg | ORAL_TABLET | Freq: Every day | ORAL | 0 refills | Status: DC

## 2019-02-23 MED ORDER — HYDROCHLOROTHIAZIDE 12.5 MG PO CAPS
12.5000 mg | ORAL_CAPSULE | Freq: Every day | ORAL | Status: DC
  Administered 2019-02-23: 12.5 mg via ORAL
  Filled 2019-02-23: qty 1

## 2019-02-23 MED ORDER — LISINOPRIL-HYDROCHLOROTHIAZIDE 20-12.5 MG PO TABS: 1.0000 | ORAL_TABLET | Freq: Every day | ORAL | 1 refills | Status: DC

## 2019-02-23 MED ORDER — LISINOPRIL 20 MG PO TABS
20.0000 mg | ORAL_TABLET | Freq: Every day | ORAL | Status: DC
  Administered 2019-02-23: 20 mg via ORAL
  Filled 2019-02-23: qty 1

## 2019-02-23 MED FILL — LISINOPRIL-HCTZ 20-12.5 MG: 20-12.5 | 30 days supply | Qty: 30 | Fill #0

## 2019-02-23 MED FILL — ATORVASTATIN CALCIUM 40 MG: 40 | 30 days supply | Qty: 30 | Fill #0

## 2019-02-23 NOTE — Discharge Summary (Addendum)
Discharge Summary    Patient ID: Scott Tucker,  MRN: [id number], DOB/AGE: Jun 16, 1965 53 y.o.  Admit date: 02/22/2019 Discharge date: 02/23/2019  Primary Care Provider: System, Pcp Not In Primary Cardiologist: No primary care provider on file.  Discharge Diagnoses    Active Problems:   Chest pain   Hypertension   Hyperlipidemia   Allergies No Known Allergies  Diagnostic Studies/Procedures    TTE: 02/22/19  IMPRESSIONS    1. Left ventricular ejection fraction, by visual estimation, is 55 to 60%. The left ventricle has normal function. Normal left ventricular size. There is mildly increased left ventricular hypertrophy.  2. Global right ventricle has normal systolic function.The right ventricular size is normal.  3. Left atrial size was normal.  4. Right atrial size was normal.  5. The mitral valve is normal in structure. Trace mitral valve regurgitation. No evidence of mitral stenosis.  6. The tricuspid valve is normal in structure. Tricuspid valve regurgitation is trivial.  7. The aortic valve is tricuspid Aortic valve regurgitation was not visualized by color flow Doppler. Structurally normal aortic valve, with no evidence of sclerosis or stenosis.  8. The pulmonic valve was normal in structure. Pulmonic valve regurgitation is not visualized by color flow Doppler.  9. The inferior vena cava is normal in size with greater than 50% respiratory variability, suggesting right atrial pressure of 3 mmHg. 10. Normal LV systolic and diastolic function.   Coronary CTA: 02/22/19  FINDINGS: Coronary calcium score: The patient's coronary artery calcium score is 188, which places the patient in the 96 percentile.  Coronary arteries: Normal coronary origins.  Right dominance.  Right Coronary Artery: Small punctate areas of calcified plaque in proximal portion, 1-24% stenosis.  Left Main Coronary Artery: Long LM, patent, no significant stenosis or  calcifications.  Left Anterior Descending Coronary Artery: Very small amount of calcified plaque at D1 bifurcation without significant stenosis.  Left Circumflex Artery: Two separate areas of highly calcified plaque in OM branch. At least moderate, possible severe calcified plaque, cannot exclude significant stenosis. Medium size vessel of 2.6-2.7 mm.  Aorta: Normal size, 28 mm at the mid ascending aorta (level of the PA bifurcation) measured double oblique. There is motion artifact at this level. No calcifications. No dissection.  Aortic Valve: No calcifications. Trileaflet.  Other findings:  Normal pulmonary vein drainage into the left atrium.  Normal left atrial appendage without a thrombus.  Normal size of the pulmonary artery.  IMPRESSION: 1. At least moderate, and possibly severe, obstructive CAD in OM, CADRADS = 3-4. CT FFR will be performed and reported separately.  2. Coronary calcium score of 188. This was 96th percentile for age and sex matched control.  3. Normal coronary origin with right dominance.  4. Recommend aggressive medical management, if symptoms persist could consider cath/coronary angiography for further evaluation of OM lesion. _____________   History of Present Illness     Scott Tucker is a 53 yo male with PMH noted above.  He reports having a cardiac catheterization about 10 years ago possibly in [place].  He describes what sounds like an angioplasty, and was placed on aspirin and Plavix post cath.  He takes medications for hypertension, hyperlipidemia, diabetes and has a primary care provider in [place].  States he has not seen them in over a year since moving to [place].  Intermittently monitor his blood pressures at home which he reports being relatively controlled.  He is not currently taking any insulin and states his blood  sugars have been relatively controlled as well.  He reports developing shortness of breath about a  week ago.  Has also had associated cough that has been productive with dark mucus.  States he has had brief episodes of centralized chest pressure lasting 1 to 2 minutes at a time.  Has had 2 episodes of this over the past week.  Also reports intermittent episodes of fever temp max 101.  Has also had some right-sided neck and shoulder stiffness. Denies any sick contacts or Covid contacts.   In talking with the patient he states he is not very active at home.  Does cook and clean around his home and does not report any anginal symptoms with these activities.  In the ED his labs showed stable electrolytes with the exception of potassium of 5.9, hemoglobin 15.5, high-sensitivity troponin 3.  EKG showed sinus rhythm with LVH, T wave inversions in V5 V6 which was new from prior EKG in March 2020.   Hospital Course     1. Chest pain: symptoms were very atypical in nature, not associated with rest or exertion. Very brief in nature lasting only 1-2 mins at a time. No chest pain while in the ED. HsT negative x2. EKG showed SR with LVH but new TWI in v5-v6 compared to prior in 07/2018. Given his RFs he underwent coronary CTA that showed obstructive CAD in OM vessel. CT FFR sent out. Given he ruled out and disease noted in small vessel, will plan for aggressive medical management for the time being. If recurrence of symptoms would consider cath at that time. Echo noted EF of 55-60% with no rWMA and mild LVH.   2. Possible CAD: reports a cath about 10 years prior, describing angioplasty to vessel but no stenting. Was suppose to be on ASA and plavix per his report. Given negative troponins, will plan for monotherapy with ASA.   3. HTN: blood pressures are elevated in the ED, reported he had been out of his home meds for quite sometime. Had not seen PCP in over a year since moving to [place]. List included lisinopril/HCTZ. This was resumed with blood pressures improved.   4. HL: had been on statin in the  past. Not currently as he ran out. LDL 113. Started on high dose atorvastatin.    5. DM: states his was insulin dependent but as above has been out. Says CBGs have been relatively controlled without meds. Glucose 99 on labs. Hgb A1c actually 4.9. Will not plan to resume any DM meds at this time since he was not taking PTA.   6. Hyperkalemia: resolved on morning labs.    General: Well developed, well nourished, male appearing in no acute distress. Head: Normocephalic, atraumatic.  Neck: Supple without bruits, JVD. Lungs:  Resp regular and unlabored, CTA. Heart: RRR, S1, S2, no S3, S4, or murmur; no rub. Abdomen: Soft, non-tender, non-distended with normoactive bowel sounds. No hepatomegaly. No rebound/guarding. No obvious abdominal masses. Extremities: No clubbing, cyanosis, edema. Distal pedal pulses are 2+ bilaterally.  Neuro: Alert and oriented X 3. Moves all extremities spontaneously. Psych: Normal affect.  [name] was seen by Dr. Eldridge Dace and determined stable for discharge home. Follow up in the office has been arranged. Medications are listed below.  _____________  Discharge Vitals Blood pressure 135/89, pulse 64, temperature 98 F (36.7 C), temperature source Oral, resp. rate 18, height  (1.676 m), weight 81.6 kg, SpO2 98 %.  Filed Weights   02/22/19 1705  Weight:  81.6 kg    Labs & Radiologic Studies    CBC Recent Labs    02/22/19 1005 02/23/19 0405  WBC 10.1 10.0  HGB 15.4 14.7  HCT 44.8 41.0  MCV 92.8 92.3  PLT 359 302   Basic Metabolic Panel Recent Labs    16/02/9609/12/20 1005 02/23/19 0405  NA 140 141  K 5.9* 4.2  CL 105 105  CO2 25 27  GLUCOSE 99 98  BUN 14 8  CREATININE 0.67 0.49*  CALCIUM 9.3 9.1   Liver Function Tests No results for input(s): AST, ALT, ALKPHOS, BILITOT, PROT, ALBUMIN in the last 72 hours. No results for input(s): LIPASE, AMYLASE in the last 72 hours. Cardiac Enzymes No results for input(s): CKTOTAL, CKMB, CKMBINDEX,  TROPONINI in the last 72 hours. BNP Invalid input(s): POCBNP D-Dimer No results for input(s): DDIMER in the last 72 hours. Hemoglobin A1C Recent Labs    02/23/19 0405  HGBA1C 4.9   Fasting Lipid Panel Recent Labs    02/23/19 0405  CHOL 170  HDL 46  LDLCALC 113*  TRIG 56  CHOLHDL 3.7   Thyroid Function Tests No results for input(s): TSH, T4TOTAL, T3FREE, THYROIDAB in the last 72 hours.  Invalid input(s): FREET3 _____________  Ct Coronary Morph W/cta Cor W/score W/ca W/cm &/or Wo/cm  Addendum Date: 02/22/2019   ADDENDUM REPORT: 02/22/2019 16:44 EXAM: OVER-READ INTERPRETATION  CT CHEST The following report is an over-read performed by radiologist Dr. Schuyler Amoraylor [facility] on 02/22/2019. This over-read does not include interpretation of cardiac or coronary anatomy or pathology. The coronary CTA interpretation by the cardiologist is attached. COMPARISON:  None FINDINGS: Cardiovascular: Normal heart size.  No pericardial effusion. Mediastinum: No mass or adenopathy identified. Imaged portions of the trachea and esophagus are unremarkable. Lungs/pleura: No pleural effusion identified 5 mm subpleural nodule identified within the left lower lobe, image 24/12. Upper abdomen: No acute findings within the imaged portions of the upper abdomen. Musculoskeletal: No acute or suspicious osseous abnormality. IMPRESSION: 1. 5 mm subpleural nodule in the left lower lobe is noted, nonspecific. No follow-up needed if patient is low-risk. Non-contrast chest CT can be considered in 12 months if patient is high-risk. This recommendation follows the consensus statement: Guidelines for Management of Incidental Pulmonary Nodules Detected on CT Images: From the Fleischner Society 2017; Radiology 2017; 284:228-243. Electronically Signed   By: Signa Kellaylor  Stroud M.D.   On: 02/22/2019 16:44   Result Date: 02/22/2019 HISTORY: Chest pain, acute, low/intermediate prob, ACS suspected EXAM:  Cardiac/Coronary CT TECHNIQUE: The patient was scanned on a [employer] Force scanner. PROTOCOL: A 120 kV prospective scan was triggered in the descending thoracic aorta at 111 HU's. Axial non-contrast 3 mm slices were carried out through the heart. The data set was analyzed on a dedicated work station and scored using the Agatson method. Gantry rotation speed was 250 msecs and collimation was 0.6 mm. Beta blockade and 0.8 mg of sl NTG was given. The 3D data set was reconstructed in 5% intervals of 35-75% of the R-R cycle. Diastolic phases were analyzed on a dedicated work station using MPR, MIP and VRT modes. The patient received 80mL OMNIPAQUE IOHEXOL 350 MG/ML SOLN of contrast. FINDINGS: Coronary calcium score: The patient's coronary artery calcium score is 188, which places the patient in the 96 percentile. Coronary arteries: Normal coronary origins.  Right dominance. Right Coronary Artery: Small punctate areas of calcified plaque in proximal portion, 1-24% stenosis. Left Main Coronary Artery: Long LM, patent, no significant stenosis or  calcifications. Left Anterior Descending Coronary Artery: Very small amount of calcified plaque at D1 bifurcation without significant stenosis. Left Circumflex Artery: Two separate areas of highly calcified plaque in OM branch. At least moderate, possible severe calcified plaque, cannot exclude significant stenosis. Medium size vessel of 2.6-2.7 mm. Aorta: Normal size, 28 mm at the mid ascending aorta (level of the PA bifurcation) measured double oblique. There is motion artifact at this level. No calcifications. No dissection. Aortic Valve: No calcifications. Trileaflet. Other findings: Normal pulmonary vein drainage into the left atrium. Normal left atrial appendage without a thrombus. Normal size of the pulmonary artery. IMPRESSION: 1. At least moderate, and possibly severe, obstructive CAD in OM, CADRADS = 3-4. CT FFR will be performed and reported separately. 2. Coronary calcium  score of 188. This was 96th percentile for age and sex matched control. 3. Normal coronary origin with right dominance. 4. Recommend aggressive medical management, if symptoms persist could consider cath/coronary angiography for further evaluation of OM lesion. Electronically Signed: By: Jodelle Red M.D. On: 02/22/2019 15:47   Dg Chest Portable 1 View  Result Date: 02/22/2019 CLINICAL DATA:  Chest pain for 2 days, cough, EXAM: PORTABLE CHEST 1 VIEW COMPARISON:  08/03/2018 FINDINGS: The heart size and mediastinal contours are within normal limits. Both lungs are clear. The visualized skeletal structures are unremarkable. IMPRESSION: No active disease. Electronically Signed   By: Donzetta Kohut M.D.   On: 02/22/2019 10:55   Disposition   Pt is being discharged home today in good condition.  Follow-up Plans & Appointments    Follow-up Information    [facility]. Schedule an appointment as soon as possible for a visit.   Contact information: [address] [facility] [address] [phone]       Dyann Kief, PA-C Follow up on 03/24/2019.   Specialty: Cardiology Why: at 11:30am for your follow up appt.  Contact information: [address] [place] [address] [phone]          Discharge Instructions    Diet - low sodium heart healthy   Complete by: As directed    Discharge instructions   Complete by: As directed    Please keep all follow up appts arranged.   Increase activity slowly   Complete by: As directed       Discharge Medications     Medication List    STOP taking these medications   clopidogrel 75 MG tablet Commonly known as: PLAVIX   Januvia 100 MG tablet Generic drug: sitaGLIPtin   Levemir FlexTouch 100 UNIT/ML Pen Generic drug: Insulin Detemir   lisinopril-hydrochlorothiazide 10-12.5 MG tablet Commonly known as: ZESTORETIC Replaced by:  lisinopril-hydrochlorothiazide 20-12.5 MG tablet   metFORMIN 1000 MG tablet Commonly known as: GLUCOPHAGE     TAKE these medications   aspirin 81 MG EC tablet Take 81 mg by mouth daily.   atorvastatin 40 MG tablet Commonly known as: LIPITOR Take 1 tablet (40 mg total) by mouth daily at 6 PM.   lisinopril-hydrochlorothiazide 20-12.5 MG tablet Commonly known as: Zestoretic Take 1 tablet by mouth daily. Replaces: lisinopril-hydrochlorothiazide 10-12.5 MG tablet   morphine 15 MG tablet Commonly known as: MSIR Take 15 mg by mouth every 4 (four) hours as needed for severe pain.   nitroGLYCERIN 0.4 MG SL tablet Commonly known as: NITROSTAT Place 0.4 mg under the tongue every 5 (five) minutes as needed for chest pain.        No  Did the patient have a percutaneous coronary intervention (stent / angioplasty)?:  No.      Outstanding Labs/Studies   FLP/LFTs in 6 weeks.   Duration of Discharge Encounter   Greater than 30 minutes including physician time.  Signed, Laverda Page NP-C 02/23/2019, 10:47 AM  I have examined the patient and reviewed assessment and plan and discussed with patient.  Agree with above as stated.    BP controlled.  A1C normal.  Feels well.   GEN: Well nourished, well developed, in no acute distress  HEENT: normal  Neck: no JVD, carotid bruits, or masses Cardiac: RRR; no murmurs, rubs, or gallops,no edema  Respiratory:  clear to auscultation bilaterally, normal work of breathing GI: soft, nontender, nondistended,  MS: no deformity or atrophy  Skin: warm and dry, no rash Neuro:  Strength and sensation are intact Psych: euthymic mood, full affect  Had some CAD in a branch vessel on CT scan.  However, med compliance is an issue.  Will see if sx are controlled with BP control.  If he has recurrent chest pain, could reconsider cath +/- PCI.  No DM meds since A1C was low.  Weight loss likely helped.    Needs to  establish with MDs here in [place] since he moved from [place].  Lance Muss

## 2019-02-23 NOTE — Progress Notes (Signed)
[name] to be D/C'd Home per MD order.  Discussed with the patient and all questions fully answered.   VSS, Skin clean, dry and intact without evidence of skin break down, no evidence of skin tears noted. IV catheter discontinued intact. Site without signs and symptoms of complications. Dressing and pressure applied.   An After Visit Summary was printed and given to the patient.    D/C education completed with patient/family including follow up instructions, medication list, d/c activities limitations if indicated, with other d/c instructions as indicated by MD - patient able to verbalize understanding, all questions fully answered.    Patient instructed to return to ED, call 911, or call MD for any changes in condition.    Patient escorted via [place], and D/C home via private car.

## 2019-02-23 NOTE — Discharge Instructions (Signed)
Cough, Adult A cough helps to clear your throat and lungs. A cough may be a sign of an illness or another medical condition. An acute cough may only last 2-3 weeks, while a chronic cough may last 8 or more weeks. Many things can cause a cough. They include:  Germs (viruses or bacteria) that attack the airway.  Breathing in things that bother (irritate) your lungs.  Allergies.  Asthma.  Mucus that runs down the back of your throat (postnasal drip).  Smoking.  Acid backing up from the stomach into the tube that moves food from the mouth to the stomach (gastroesophageal reflux).  Some medicines.  Lung problems.  Other medical conditions, such as heart failure or a blood clot in the lung (pulmonary embolism). Follow these instructions at home: Medicines  Take over-the-counter and prescription medicines only as told by your doctor.  Talk with your doctor before you take medicines that stop a cough (coughsuppressants). Lifestyle   Do not smoke, and try not to be around smoke. Do not use any products that contain nicotine or tobacco, such as cigarettes, e-cigarettes, and chewing tobacco. If you need help quitting, ask your doctor.  Drink enough fluid to keep your pee (urine) pale yellow.  Avoid caffeine.  Do not drink alcohol if your doctor tells you not to drink. General instructions   Watch for any changes in your cough. Tell your doctor about them.  Always cover your mouth when you cough.  Stay away from things that make you cough, such as perfume, candles, campfire smoke, or cleaning products.  If the air is dry, use a cool mist vaporizer or humidifier in your home.  If your cough is worse at night, try using extra pillows to raise your head up higher while you sleep.  Rest as needed.  Keep all follow-up visits as told by your doctor. This is important. Contact a doctor if:  You have new symptoms.  You cough up pus.  Your cough does not get better after 2-3  weeks, or your cough gets worse.  Cough medicine does not help your cough and you are not sleeping well.  You have pain that gets worse or pain that is not helped with medicine.  You have a fever.  You are losing weight and you do not know why.  You have night sweats. Get help right away if:  You cough up blood.  You have trouble breathing.  Your heartbeat is very fast. These symptoms may be an emergency. Do not wait to see if the symptoms will go away. Get medical help right away. Call your local emergency services (911 in the [place] Do not drive yourself to the hospital. Summary  A cough helps to clear your throat and lungs. Many things can cause a cough.  Take over-the-counter and prescription medicines only as told by your doctor.  Always cover your mouth when you cough.  Contact a doctor if you have new symptoms or you have a cough that does not get better or gets worse. This information is not intended to replace advice given to you by your health care provider. Make sure you discuss any questions you have with your health care provider. Document Released: 01/10/2011 Document Revised: 05/18/2018 Document Reviewed: 05/18/2018 Elsevier Patient Education  [place].   Chest Wall Pain Chest wall pain is pain in or around the bones and muscles of your chest. Chest wall pain may be caused by:  An injury.  Coughing a lot.  Using your chest and arm muscles too much. Sometimes, the cause may not be known. This pain may take a few weeks or longer to get better. Follow these instructions at home: Managing pain, stiffness, and swelling If told, put ice on the painful area:  Put ice in a plastic bag.  Place a towel between your skin and the bag.  Leave the ice on for 20 minutes, 2-3 times a day.  Activity  Rest as told by your doctor.  Avoid doing things that cause pain. This includes lifting heavy items.  Ask your doctor what activities are safe for  you. General instructions   Take over-the-counter and prescription medicines only as told by your doctor.  Do not use any products that contain nicotine or tobacco, such as cigarettes, e-cigarettes, and chewing tobacco. If you need help quitting, ask your doctor.  Keep all follow-up visits as told by your doctor. This is important. Contact a doctor if:  You have a fever.  Your chest pain gets worse.  You have new symptoms. Get help right away if:  You feel sick to your stomach (nauseous) or you throw up (vomit).  You feel sweaty or light-headed.  You have a cough with mucus from your lungs (sputum) or you cough up blood.  You are short of breath. These symptoms may be an emergency. Do not wait to see if the symptoms will go away. Get medical help right away. Call your local emergency services (911 in the [place] Do not drive yourself to the hospital. Summary  Chest wall pain is pain in or around the bones and muscles of your chest.  It may be treated with ice, rest, and medicines. Your condition may also get better if you avoid doing things that cause pain.  Contact a doctor if you have a fever, chest pain that gets worse, or new symptoms.  Get help right away if you feel light-headed or you get short of breath. These symptoms may be an emergency. This information is not intended to replace advice given to you by your health care provider. Make sure you discuss any questions you have with your health care provider. Document Released: 10/16/2007 Document Revised: 10/30/2017 Document Reviewed: 10/30/2017 Elsevier Patient Education  2020 [employer].

## 2019-02-23 NOTE — TOC Transition Note (Signed)
Transition of Care [facility]) - CM/SW Discharge Note   Patient Details  Name: Scott Tucker MRN: [id number] Date of Birth: 1965-12-22  [facility]) CM/SW Contact:  Marilu Favre, RN Phone Number: 02/23/2019, 11:37 AM   Clinical Narrative:      [facility] pharmacy filled prescriptions, patient paid $13 co pay.   [facility] information provided  Final next level of care: Home/Self Care Barriers to Discharge: No Barriers Identified   Patient Goals and CMS Choice Patient states their goals for this hospitalization and ongoing recovery are:: to go home CMS Medicare.gov Compare Post Acute Care list provided to:: Patient Choice offered to / list presented to : Patient  Discharge Placement                       Discharge Plan and Services   Discharge Planning Services: CM Consult, [facility], [facility], Medication Assistance            DME Arranged: N/A         HH Arranged: NA          Social Determinants of Health (SDOH) Interventions     Readmission Risk Interventions No flowsheet data found.

## 2019-03-16 ENCOUNTER — Other Ambulatory Visit: Payer: Self-pay

## 2019-03-16 ENCOUNTER — Ambulatory Visit (INDEPENDENT_AMBULATORY_CARE_PROVIDER_SITE_OTHER): Payer: Self-pay | Admitting: Primary Care

## 2019-03-16 ENCOUNTER — Encounter (INDEPENDENT_AMBULATORY_CARE_PROVIDER_SITE_OTHER): Payer: Self-pay | Admitting: Primary Care

## 2019-03-16 VITALS — BP 122/67 | HR 73 | Ht 66.0 in | Wt 176.4 lb

## 2019-03-16 DIAGNOSIS — Z1211 Encounter for screening for malignant neoplasm of colon: Secondary | ICD-10-CM

## 2019-03-16 DIAGNOSIS — Z23 Encounter for immunization: Secondary | ICD-10-CM

## 2019-03-16 DIAGNOSIS — Z09 Encounter for follow-up examination after completed treatment for conditions other than malignant neoplasm: Secondary | ICD-10-CM

## 2019-03-16 DIAGNOSIS — Z7689 Persons encountering health services in other specified circumstances: Secondary | ICD-10-CM

## 2019-03-16 DIAGNOSIS — F5101 Primary insomnia: Secondary | ICD-10-CM

## 2019-03-16 NOTE — Progress Notes (Signed)
New Patient Office Visit  Subjective:  Patient ID: Scott Tucker, male    DOB: November 11, 1965  Age: 53 y.o. MRN: [id number]  CC:  Chief Complaint  Patient presents with  . Hospitalization Follow-up    chest pain/abnormal EKG     HPI DEANDRA GADSON presents for hospital follow up . He presented to the hospital for chest pain evaluated abnormal EKG. Admitted for work-up. He presents today with a elevated blood pressure 153/88. He denies shortness of breath, headaches, chest pain or lower extremity edema. Recheck Bp 126/67. Diabetic screening in hospital 4.6.  Past Medical History:  Diagnosis Date  . Chronic back pain   . Coronary artery disease   . Diabetes mellitus without complication [facility])     Past Surgical History:  Procedure Laterality Date  . BACK SURGERY    . SHOULDER SURGERY      History reviewed. No pertinent family history.  Social History   Socioeconomic History  . Marital status: Single    Spouse name: Not on file  . Number of children: Not on file  . Years of education: Not on file  . Highest education level: Not on file  Occupational History  . Not on file  Social Needs  . Financial resource strain: Not on file  . Food insecurity    Worry: Not on file    Inability: Not on file  . Transportation needs    Medical: Not on file    Non-medical: Not on file  Tobacco Use  . Smoking status: Never Smoker  . Smokeless tobacco: Never Used  Substance and Sexual Activity  . Alcohol use: Yes    Comment: occasional  . Drug use: Yes    Types: Marijuana  . Sexual activity: Not on file  Lifestyle  . Physical activity    Days per week: Not on file    Minutes per session: Not on file  . Stress: Not on file  Relationships  . Social Herbalist on phone: Not on file    Gets together: Not on file    Attends religious service: Not on file    Active member of club or organization: Not on file    Attends meetings of clubs or organizations: Not on file     Relationship status: Not on file  . Intimate partner violence    Fear of current or ex partner: Not on file    Emotionally abused: Not on file    Physically abused: Not on file    Forced sexual activity: Not on file  Other Topics Concern  . Not on file  Social History Narrative  . Not on file    ROS Review of Systems  Respiratory:       Snores  Genitourinary: Positive for frequency.       Diuretic   Psychiatric/Behavioral: Positive for sleep disturbance.  All other systems reviewed and are negative.   Objective:   Today's Vitals: BP 122/67 (BP Location: Right Arm, Patient Position: Sitting, Cuff Size: Normal)   Pulse 73   Ht 5\' 6"  (1.676 m)   Wt 176 lb 6.4 oz (80 kg)   SpO2 94%   BMI 28.47 kg/m   Physical Exam Vitals signs reviewed.  Constitutional:      Appearance: Normal appearance.  HENT:     Head: Normocephalic.     Right Ear: Tympanic membrane normal.     Left Ear: Tympanic membrane normal.  Neck:  Musculoskeletal: Normal range of motion.  Cardiovascular:     Rate and Rhythm: Normal rate and regular rhythm.     Heart sounds: Normal heart sounds.  Pulmonary:     Effort: Pulmonary effort is normal.     Breath sounds: Normal breath sounds.  Abdominal:     General: Abdomen is flat. Bowel sounds are normal.     Palpations: Abdomen is soft.  Musculoskeletal: Normal range of motion.     Comments: ROM decreased on right side shoulder injury able to raise 45 degrees  Neurological:     Mental Status: He is alert and oriented to person, place, and time.  Psychiatric:        Mood and Affect: Mood normal.     Assessment & Plan:   Problem List Items Addressed This Visit    None    Visit Diagnoses    Need for Tdap vaccination    -  Primary   Relevant Orders   Tdap vaccine greater than or equal to 7yo IM (Completed)   Special screening for malignant neoplasms, colon       Relevant Orders   Fecal occult blood, imunochemical   Hospital discharge  follow-up       Primary insomnia       Establishing care with new doctor, encounter for          Outpatient Encounter Medications as of 03/16/2019  Medication Sig  . aspirin 81 MG EC tablet Take 81 mg by mouth daily.  Marland Kitchen atorvastatin (LIPITOR) 40 MG tablet Take 1 tablet (40 mg total) by mouth daily at 6 PM.  . lisinopril-hydrochlorothiazide (ZESTORETIC) 20-12.5 MG tablet Take 1 tablet by mouth daily.  . nitroGLYCERIN (NITROSTAT) 0.4 MG SL tablet Place 0.4 mg under the tongue every 5 (five) minutes as needed for chest pain.  Marland Kitchen morphine (MSIR) 15 MG tablet Take 15 mg by mouth every 4 (four) hours as needed for severe pain.   No facility-administered encounter medications on file as of 03/16/2019.   Adriana was seen today for hospitalization follow-up.  Diagnoses and all orders for this visit:  Need for Tdap vaccination -     Tdap vaccine greater than or equal to 7yo IM  Special screening for malignant neoplasms, colon -     Fecal occult blood, imunochemical; Future Hospital discharge follow-up  Chest pain:  atypical in nature, not associated with rest or exertion. . EKG showed SR with LVH but new TWI in v5-v6 compared to prior in 07/2018. Plan for aggressive medical management for the time being with  mild LVH.  Discharge with ASA.  Primary insomnia We willtry melatonin 5mg -15 mg at night for sleep, can also do benadryl 25-50mg  at night for sleep.  If this does not help we can try prescription medication.  Also here is some information about good sleep hygiene.    Establishing care with new doctor, encounter for , NP-C will be your  (PCP)  She is a mastered prepared able to diagnosis and undiagnosed health concern as well as continuing care of varied medical conditions, not limited by cause, organ system, or diagnosis.    Follow-up: Return in about 3 months (around 06/16/2019) for IN PERSON- HTN .   08/14/2019, NP

## 2019-03-16 NOTE — Patient Instructions (Addendum)
Normal colon cancer screening.  CDC recommends colorectal screening from ages 29-75. Screening can begin at 79 or earlier in some cases. This screening is used for a disease when no symptoms are present . Diagnostic test is used for symptoms examples blood in stool, colorectal polyps or coloector cancer, family history or inflammatory bowel disease - chron's or ulcerative colitis .(USPSTF)  Can try melatonin 5mg -15 mg at night for sleep, can also do benadryl 25-50mg  at night for sleep.  If this does not help we can try prescription medication.  Also here is some information about good sleep hygiene.   Insomnia Insomnia is frequent trouble falling and/or staying asleep. Insomnia can be a long term problem or a short term problem. Both are common. Insomnia can be a short term problem when the wakefulness is related to a certain stress or worry. Long term insomnia is often related to ongoing stress during waking hours and/or poor sleeping habits. Overtime, sleep deprivation itself can make the problem worse. Every little thing feels more severe because you are overtired and your ability to cope is decreased. CAUSES   Stress, anxiety, and depression.  Poor sleeping habits.  Distractions such as TV in the bedroom.  Naps close to bedtime.  Engaging in emotionally charged conversations before bed.  Technical reading before sleep.  Alcohol and other sedatives. They may make the problem worse. They can hurt normal sleep patterns and normal dream activity.  Stimulants such as caffeine for several hours prior to bedtime.  Pain syndromes and shortness of breath can cause insomnia.  Exercise late at night.  Changing time zones may cause sleeping problems (jet lag). It is sometimes helpful to have someone observe your sleeping patterns. They should look for periods of not breathing during the night (sleep apnea). They should also look to see how long those periods last. If you live alone or  observers are uncertain, you can also be observed at a sleep clinic where your sleep patterns will be professionally monitored. Sleep apnea requires a checkup and treatment. Give your caregivers your medical history. Give your caregivers observations your family has made about your sleep.  SYMPTOMS   Not feeling rested in the morning.  Anxiety and restlessness at bedtime.  Difficulty falling and staying asleep. TREATMENT   Your caregiver may prescribe treatment for an underlying medical disorders. Your caregiver can give advice or help if you are using alcohol or other drugs for self-medication. Treatment of underlying problems will usually eliminate insomnia problems.  Medications can be prescribed for short time use. They are generally not recommended for lengthy use.  Over-the-counter sleep medicines are not recommended for lengthy use. They can be habit forming.  You can promote easier sleeping by making lifestyle changes such as:  Using relaxation techniques that help with breathing and reduce muscle tension.  Exercising earlier in the day.  Changing your diet and the time of your last meal. No night time snacks.  Establish a regular time to go to bed.  Counseling can help with stressful problems and worry.  Soothing music and white noise may be helpful if there are background noises you cannot remove.  Stop tedious detailed work at least one hour before bedtime. HOME CARE INSTRUCTIONS   Keep a diary. Inform your caregiver about your progress. This includes any medication side effects. See your caregiver regularly. Take note of:  Times when you are asleep.  Times when you are awake during the night.  The quality of your sleep.  How you feel the next day. This information will help your caregiver care for you.  Get out of bed if you are still awake after 15 minutes. Read or do some quiet activity. Keep the lights down. Wait until you feel sleepy and go back to  bed.  Keep regular sleeping and waking hours. Avoid naps.  Exercise regularly.  Avoid distractions at bedtime. Distractions include watching television or engaging in any intense or detailed activity like attempting to balance the household checkbook.  Develop a bedtime ritual. Keep a familiar routine of bathing, brushing your teeth, climbing into bed at the same time each night, listening to soothing music. Routines increase the success of falling to sleep faster.  Use relaxation techniques. This can be using breathing and muscle tension release routines. It can also include visualizing peaceful scenes. You can also help control troubling or intruding thoughts by keeping your mind occupied with boring or repetitive thoughts like the old concept of counting sheep. You can make it more creative like imagining planting one beautiful flower after another in your backyard garden.  During your day, work to eliminate stress. When this is not possible use some of the previous suggestions to help reduce the anxiety that accompanies stressful situations. MAKE SURE YOU:   Understand these instructions.  Will watch your condition.  Will get help right away if you are not doing well or get worse. Document Released: 04/26/2000 Document Revised: 07/22/2011 Document Reviewed: 05/27/2007 [facility] Patient Information 2015 [place], [place]. This information is not intended to replace advice given to you by your health care provider. Make sure you discuss any questions you have with your health care provider.

## 2019-03-22 DIAGNOSIS — I2510 Atherosclerotic heart disease of native coronary artery without angina pectoris: Secondary | ICD-10-CM | POA: Insufficient documentation

## 2019-03-22 NOTE — Progress Notes (Signed)
Cardiology Office Note    Date:  03/24/2019   ID:  Ram, Haugan 12-02-1965, MRN [id number]  PCP:  Grayce Sessions, NP  Cardiologist: Lance Muss, MD EPS: None  No chief complaint on file.   History of Present Illness:  Scott Tucker is a 53 y.o. male with history of hypertension, HLD, DM, with what  sounds like an angioplasty in [place] 10 years ago.  Since moving to [facility] a year ago he has not been taking his medications or his insulin but blood sugars have been controlled.  Patient was admitted to the hospital 02/22/2019 with chest pain and new EKG changes with LVH and T wave inversion in V5 through V6.  He underwent coronary CTA that showed obstructive CAD in an OM vessel.  FFR was sent out.    FFR 0.72 showed significant stenosis in OM1 which is a small caliber vessel.  Recommend medical management but consider PCI if refractory symptoms.  Echo showed normal LVEF 55 to 60% with mild LVH.  Lisinopril HCTZ resumed.  Patient complains of arthritis all over. No chest pain. Has some dyspnea on exertion-taking trash can.  Walks 1 mile 3 times a week. To chest pain or short of breath. Only if he's pushing or pulling. Had feeling of right face tightening up and back of his ear pain-saw PCP and thought it could be Bell's Palsy.    Past Medical History:  Diagnosis Date  . Chronic back pain   . Coronary artery disease   . Diabetes mellitus without complication [facility])     Past Surgical History:  Procedure Laterality Date  . BACK SURGERY    . SHOULDER SURGERY      Current Medications: Current Meds  Medication Sig  . aspirin 81 MG EC tablet Take 81 mg by mouth daily.  Marland Kitchen atorvastatin (LIPITOR) 40 MG tablet Take 1 tablet (40 mg total) by mouth daily at 6 PM.  . lisinopril-hydrochlorothiazide (ZESTORETIC) 20-12.5 MG tablet Take 1 tablet by mouth daily.  . nitroGLYCERIN (NITROSTAT) 0.4 MG SL tablet Place 1 tablet (0.4 mg total) under the tongue every 5  (five) minutes as needed for chest pain.  . [DISCONTINUED] atorvastatin (LIPITOR) 40 MG tablet Take 1 tablet (40 mg total) by mouth daily at 6 PM.  . [DISCONTINUED] lisinopril-hydrochlorothiazide (ZESTORETIC) 20-12.5 MG tablet Take 1 tablet by mouth daily.  . [DISCONTINUED] nitroGLYCERIN (NITROSTAT) 0.4 MG SL tablet Place 0.4 mg under the tongue every 5 (five) minutes as needed for chest pain.     Allergies:   Patient has no known allergies.   Social History   Socioeconomic History  . Marital status: Single    Spouse name: Not on file  . Number of children: Not on file  . Years of education: Not on file  . Highest education level: Not on file  Occupational History  . Not on file  Social Needs  . Financial resource strain: Not on file  . Food insecurity    Worry: Not on file    Inability: Not on file  . Transportation needs    Medical: Not on file    Non-medical: Not on file  Tobacco Use  . Smoking status: Never Smoker  . Smokeless tobacco: Never Used  Substance and Sexual Activity  . Alcohol use: Yes    Comment: occasional  . Drug use: Yes    Types: Marijuana  . Sexual activity: Not on file  Lifestyle  . Physical activity  Days per week: Not on file    Minutes per session: Not on file  . Stress: Not on file  Relationships  . Social Herbalist on phone: Not on file    Gets together: Not on file    Attends religious service: Not on file    Active member of club or organization: Not on file    Attends meetings of clubs or organizations: Not on file    Relationship status: Not on file  Other Topics Concern  . Not on file  Social History Narrative  . Not on file     Family History:  The patient's   family history includes Heart disease in his maternal uncle.   ROS:   Please see the history of present illness.    Review of Systems  Cardiovascular: Positive for dyspnea on exertion.  Musculoskeletal: Positive for arthritis, back pain and joint pain.   Psychiatric/Behavioral: Positive for depression and memory loss.   All other systems reviewed and are negative.   PHYSICAL EXAM:   VS:  BP 132/84   Pulse 100   Ht 5\' 6"  (1.676 m)   Wt 173 lb 6.4 oz (78.7 kg)   SpO2 97%   BMI 27.99 kg/m   Physical Exam  GEN: Well nourished, well developed, in no acute distress  Neck: no JVD, carotid bruits, or masses Cardiac:RRR; no murmurs, rubs, or gallops  Respiratory:  clear to auscultation bilaterally, normal work of breathing GI: soft, nontender, nondistended, + BS Ext: Trace of edema without cyanosis, clubbing, or edema, Good distal pulses bilaterally Neuro:  Alert and Oriented x 3 Psych: euthymic mood, full affect  Wt Readings from Last 3 Encounters:  03/24/19 173 lb 6.4 oz (78.7 kg)  03/16/19 176 lb 6.4 oz (80 kg)  02/22/19 180 lb (81.6 kg)      Studies/Labs Reviewed:   EKG:  EKG is not ordered today.    Recent Labs: 02/23/2019: BUN 8; Creatinine, Ser 0.49; Hemoglobin 14.7; Platelets 302; Potassium 4.2; Sodium 141   Lipid Panel    Component Value Date/Time   CHOL 170 02/23/2019 0405   TRIG 56 02/23/2019 0405   HDL 46 02/23/2019 0405   CHOLHDL 3.7 02/23/2019 0405   VLDL 11 02/23/2019 0405   LDLCALC 113 (H) 02/23/2019 0405    Additional studies/ records that were reviewed today include:  TTE: 02/22/19   IMPRESSIONS      1. Left ventricular ejection fraction, by visual estimation, is 55 to 60%. The left ventricle has normal function. Normal left ventricular size. There is mildly increased left ventricular hypertrophy.  2. Global right ventricle has normal systolic function.The right ventricular size is normal.  3. Left atrial size was normal.  4. Right atrial size was normal.  5. The mitral valve is normal in structure. Trace mitral valve regurgitation. No evidence of mitral stenosis.  6. The tricuspid valve is normal in structure. Tricuspid valve regurgitation is trivial.  7. The aortic valve is tricuspid Aortic valve  regurgitation was not visualized by color flow Doppler. Structurally normal aortic valve, with no evidence of sclerosis or stenosis.  8. The pulmonic valve was normal in structure. Pulmonic valve regurgitation is not visualized by color flow Doppler.  9. The inferior vena cava is normal in size with greater than 50% respiratory variability, suggesting right atrial pressure of 3 mmHg. 10. Normal LV systolic and diastolic function.     Coronary CTA: 02/22/19   FINDINGS: Coronary calcium score: The patient's  coronary artery calcium score is 188, which places the patient in the 96 percentile.   Coronary arteries: Normal coronary origins.  Right dominance.   Right Coronary Artery: Small punctate areas of calcified plaque in proximal portion, 1-24% stenosis.   Left Main Coronary Artery: Long LM, patent, no significant stenosis or calcifications.   Left Anterior Descending Coronary Artery: Very small amount of calcified plaque at D1 bifurcation without significant stenosis.   Left Circumflex Artery: Two separate areas of highly calcified plaque in OM branch. At least moderate, possible severe calcified plaque, cannot exclude significant stenosis. Medium size vessel of 2.6-2.7 mm.   Aorta: Normal size, 28 mm at the mid ascending aorta (level of the PA bifurcation) measured double oblique. There is motion artifact at this level. No calcifications. No dissection.   Aortic Valve: No calcifications. Trileaflet.   Other findings:   Normal pulmonary vein drainage into the left atrium.   Normal left atrial appendage without a thrombus.   Normal size of the pulmonary artery.   IMPRESSION: 1. At least moderate, and possibly severe, obstructive CAD in OM, CADRADS = 3-4. CT FFR will be performed and reported separately.   2. Coronary calcium score of 188. This was 96th percentile for age and sex matched control.   3. Normal coronary origin with right dominance.   4. Recommend  aggressive medical management, if symptoms persist could consider cath/coronary angiography for further evaluation of OM lesion.  FFRFINDINGS: FFRct analysis was performed on the original cardiac CT angiogram dataset. Diagrammatic representation of the FFRct analysis is provided in a separate PDF document in PACS. This dictation was created using the PDF document and an interactive 3D model of the results. 3D model is not available in the EMR/PACS. Normal FFR range is >0.80.   1. Left Main:  No significant stenosis. FFR = 1.00   2. LAD: No significant stenosis. Proximal FFR = 0.96, Mid FFR =0.95, Distal FFR = 0.87. 3. LCX: No significant stenosis. Proximal FFR = 0.94, Distal FFR = 0.83. There is a stenosis in OM1 with FFR 0.72. 4. RCA: No significant stenosis. Proximal FFR = 0.99, Mid FFR =0.97, Distal FFR = 0.95.   IMPRESSION: 1. CT FFR analysis shows significant stenosis in OM1 vessel with FFR 0.72. This is a small caliber vessel, recommend medical management but consider PCI if there are refractory symptoms.     Electronically Signed   By: Jodelle RedBridgette  Christopher M.D.   On: 03/03/2019 15:17   Addended by Jodelle Redhristopher, Bridgette, MD on 03/03/2019  3:20 PM      ASSESSMENT:    1. Essential hypertension   2. Coronary artery disease involving native coronary artery of native heart without angina pectoris   3. Hyperlipidemia, unspecified hyperlipidemia type   4. Diabetes mellitus type 2, diet-controlled (HCC)      PLAN:  In order of problems listed above:  CAD with history of what sounds like angioplasty in [place] 10 years ago.  Recent admission with chest pain troponins negative.  Coronary CTA calcium score 188 which puts him in the 96th percentile for age and sex match control, moderate to severe calcified plaque in circumflex OM branch sent for FFR which showed significant stenosis 0.72 but because of small caliber vessel initially recommended medical therapy and  cardiac cath only if refractory symptoms.  CT scan reviewed in detail with patient and his significant other.  Patient complains of dyspnea when he is pushing or pulling things are bringing his garbage can up but not  with his 1 mile walk.  No chest pain.  Encouraged him to increase his exercise to 150 minutes weekly and let us know if he has any chest pain.  He is limited because of his arthritis.  Essential hypertension controlled  Hyperlipidemia LDL 113 started on high-dose atorvastatin-lipid panel in 3 months  Diabetes mellitus hemoglobin A1c was 4.9 therefore no meds were resumed    Medication Adjustments/Labs and Tests Ordered: Current medicines are reviewed at length with the patient today.  Concerns regarding medicines are outlined above.  Medication changes, Labs and Tests ordered today are listed in the Patient Instructions below. Patient Instructions  Medication Instructions:  Your physician recommends that you continue on your current medications as directed. Please refer to the Current Medication list given to you today.  If you need a refill on your cardiac medications before your next appointment, please call your pharmacy.   Lab work: Your physician recommends that you return for a FASTING lipid profile on 06/24/19   If you have labs (blood work) drawn today and your tests are completely normal, you will receive your results only by: Marland Kitchen MyChart Message (if you have MyChart) OR . A paper copy in the mail If you have any lab test that is abnormal or we need to change your treatment, we will call you to review the results.  Testing/Procedures: None ordered  Follow-Up: . Follow up with Dr. Eldridge Dace on 07/19/19 at 11:20 AM  Any Other Special Instructions Will Be Listed Below (If Applicable).       Elson Clan, PA-C  03/24/2019 11:56 AM    [facility] [address] [place], [place], [place]  [address] Phone: [phone]; Fax: [phone]

## 2019-03-24 ENCOUNTER — Encounter: Payer: Self-pay | Admitting: Physician Assistant

## 2019-03-24 ENCOUNTER — Other Ambulatory Visit: Payer: Self-pay

## 2019-03-24 ENCOUNTER — Ambulatory Visit (INDEPENDENT_AMBULATORY_CARE_PROVIDER_SITE_OTHER): Payer: Self-pay | Admitting: Physician Assistant

## 2019-03-24 VITALS — BP 132/84 | HR 100 | Ht 66.0 in | Wt 173.4 lb

## 2019-03-24 DIAGNOSIS — E785 Hyperlipidemia, unspecified: Secondary | ICD-10-CM

## 2019-03-24 DIAGNOSIS — I2510 Atherosclerotic heart disease of native coronary artery without angina pectoris: Secondary | ICD-10-CM

## 2019-03-24 DIAGNOSIS — I10 Essential (primary) hypertension: Secondary | ICD-10-CM

## 2019-03-24 DIAGNOSIS — E119 Type 2 diabetes mellitus without complications: Secondary | ICD-10-CM

## 2019-03-24 MED ORDER — ATORVASTATIN CALCIUM 40 MG PO TABS: 40.0000 mg | ORAL_TABLET | Freq: Every day | ORAL | 3 refills | Status: DC

## 2019-03-24 MED ORDER — LISINOPRIL-HYDROCHLOROTHIAZIDE 20-12.5 MG PO TABS: 1.0000 | ORAL_TABLET | Freq: Every day | ORAL | 3 refills | Status: DC

## 2019-03-24 MED ORDER — NITROGLYCERIN 0.4 MG SL SUBL: 0.4000 mg | SUBLINGUAL_TABLET | SUBLINGUAL | 1 refills | Status: DC | PRN

## 2019-03-24 NOTE — Patient Instructions (Addendum)
Medication Instructions:  Your physician recommends that you continue on your current medications as directed. Please refer to the Current Medication list given to you today.  If you need a refill on your cardiac medications before your next appointment, please call your pharmacy.   Lab work: Your physician recommends that you return for a FASTING lipid profile on 06/24/19   If you have labs (blood work) drawn today and your tests are completely normal, you will receive your results only by: Marland Kitchen MyChart Message (if you have MyChart) OR . A paper copy in the mail If you have any lab test that is abnormal or we need to change your treatment, we will call you to review the results.  Testing/Procedures: None ordered  Follow-Up: . Follow up with Dr. Irish Lack on 07/19/19 at 11:20 AM  Any Other Special Instructions Will Be Listed Below (If Applicable).

## 2019-03-25 ENCOUNTER — Telehealth (INDEPENDENT_AMBULATORY_CARE_PROVIDER_SITE_OTHER): Payer: Self-pay

## 2019-03-25 NOTE — Telephone Encounter (Signed)
Patient called to inform PCP that yesterday while he went to drink some water he jaw was hurting. Patient states his jaw was drawing almost feeling like a stroke. Patient states he ran to the bathroom to see if the right side of his  face was dropping but was not.   Please advice [phone]  Thank you Whitney Post

## 2019-03-25 NOTE — Telephone Encounter (Signed)
FWD to PCP. Scott Tucker S Sakiyah Shur, CMA  

## 2019-03-25 NOTE — Telephone Encounter (Signed)
Called Scott Tucker all symptoms have resolved. He denies any facial difference - eye drooping, asymmetrical smile, sticking out tongue no deviation or eye brow raising. Discussed Bell's palsy and if not treated damage may be permanent. If any of these problems exist go to emergency room

## 2019-04-20 ENCOUNTER — Telehealth (INDEPENDENT_AMBULATORY_CARE_PROVIDER_SITE_OTHER): Payer: Medicaid Other | Admitting: Primary Care

## 2019-04-20 ENCOUNTER — Ambulatory Visit (INDEPENDENT_AMBULATORY_CARE_PROVIDER_SITE_OTHER): Payer: Self-pay | Admitting: Licensed Clinical Social Worker

## 2019-04-20 ENCOUNTER — Other Ambulatory Visit: Payer: Self-pay

## 2019-04-20 DIAGNOSIS — F329 Major depressive disorder, single episode, unspecified: Secondary | ICD-10-CM

## 2019-04-20 DIAGNOSIS — F4323 Adjustment disorder with mixed anxiety and depressed mood: Secondary | ICD-10-CM

## 2019-04-20 DIAGNOSIS — F5101 Primary insomnia: Secondary | ICD-10-CM

## 2019-04-20 DIAGNOSIS — F32A Depression, unspecified: Secondary | ICD-10-CM

## 2019-04-20 MED ORDER — FLUOXETINE HCL 20 MG PO TABS: 20.0000 mg | ORAL_TABLET | Freq: Every day | ORAL | 5 refills | Status: DC

## 2019-04-20 MED ORDER — MELATONIN 5 MG PO TABS: 5.0000 mg | ORAL_TABLET | Freq: Every day | ORAL | 5 refills | Status: DC

## 2019-04-20 MED ORDER — IBUPROFEN 800 MG PO TABS: 600.0000 mg | ORAL_TABLET | Freq: Three times a day (TID) | ORAL | 1 refills | Status: DC | PRN

## 2019-04-21 ENCOUNTER — Telehealth: Payer: Self-pay | Admitting: Licensed Clinical Social Worker

## 2019-04-21 NOTE — Telephone Encounter (Signed)
Return call placed to patient. Patient shared that medications that were prescribed yesterday by PCP will cost approximately $50. LCSW informed pt that medications could be $0-4 for each prescription at [facility]. Pt requests medication be transferred to [facility].   LCSW followed up with pt regarding required paperwork to complete CAFA application. Pt was provided [facility]'s fax number to have Food Stamp Eligibility Letter sent over to add to his application for review. He was informed that [facility]'s notary will be available Friday, April 23, 2019. No additional concerns noted.   Pt was encouraged to contact LCSW and/or PCP with any additional questions or concerns.

## 2019-04-22 MED FILL — IBUPROFEN 800 MG TABLET: 800 | 30 days supply | Qty: 90 | Fill #0

## 2019-04-22 MED FILL — FLUoxetine HCL 20 MG TABS: 20 | 30 days supply | Qty: 30 | Fill #0

## 2019-04-23 NOTE — BH Specialist Note (Signed)
Integrated Behavioral Health Visit via Telemedicine (Telephone)  04/20/2019 Scott Tucker [id number]   Session Start time: 11:50 AM  Session End time: 12:20 PM Total time: 30  Referring Provider: NP Oletta Lamas Type of Visit: Telephonic Patient location: Home [facility] Provider location: Office All persons participating in visit: Pt and LCSW  Confirmed patient's address: No  Confirmed patient's phone number: Yes  Any changes to demographics: No   Confirmed patient's insurance: Yes  Any changes to patient's insurance: No   Discussed confidentiality: Yes    The following statements were read to the patient and/or legal guardian that are established with the [facility] Provider.  "The purpose of this phone visit is to provide behavioral health care while limiting exposure to the coronavirus (COVID19).  There is a possibility of technology failure and discussed alternative modes of communication if that failure occurs."  "By engaging in this telephone visit, you consent to the provision of healthcare.  Additionally, you authorize for your insurance to be billed for the services provided during this telephone visit."   Patient and/or legal guardian consented to telephone visit: Yes   PRESENTING CONCERNS: Patient and/or family reports the following symptoms/concerns: Pt reports difficutly obtaining sleep (approx 2-3 hrs daily) and an increase in irritability triggered by chronic pain.  Duration of problem: Ongoing; Severity of problem: severe  STRENGTHS (Protective Factors/Coping Skills): Pt receives support from girlfriend Pt is open to medication management  GOALS ADDRESSED: Patient will: 1.  Reduce symptoms of: agitation, anxiety, depression and insomnia  2.  Increase knowledge and/or ability of: coping skills  3.  Demonstrate ability to: Increase healthy adjustment to current life circumstances and Increase adequate support systems for  patient/family  INTERVENTIONS: Interventions utilized:  Solution-Focused Strategies, Supportive Counseling and Psychoeducation and/or Health Education Standardized Assessments completed: GAD-7 and PHQ 2&9  ASSESSMENT: Patient currently experiencing difficutly obtaining sleep (approx 2-3 hrs daily) and an increase in irritability triggered by chronic pain. Additional psychosocial stressors include inability to work and current uninsured status.   Patient may benefit from medication management through PCP. Strategies to assist in management and/or decrease of symptoms discussed. Pt has completed the CAFA application for medical billing assistance through [facility]. LCSW discussed how to obtain supportive documentation requested.   PLAN: 1. Follow up with behavioral health clinician on : Contact LCSW and PCP with any behavioral health and/or resource needs 2. Behavioral recommendations: Utilize healthy coping skills, comply with medication management, and provide supportive documentation for the CAFA application 3. Referral(s): [place] (In Clinic)  Rebekah Chesterfield, [place] 04/26/2019 10:24 AM

## 2019-04-26 ENCOUNTER — Telehealth (INDEPENDENT_AMBULATORY_CARE_PROVIDER_SITE_OTHER): Payer: Self-pay | Admitting: Licensed Clinical Social Worker

## 2019-04-26 ENCOUNTER — Other Ambulatory Visit (INDEPENDENT_AMBULATORY_CARE_PROVIDER_SITE_OTHER): Payer: Self-pay | Admitting: Primary Care

## 2019-04-26 MED ORDER — NITROGLYCERIN 0.4 MG SL SUBL: 0.4000 mg | SUBLINGUAL_TABLET | SUBLINGUAL | 1 refills | Status: DC | PRN

## 2019-04-26 MED ORDER — LISINOPRIL-HYDROCHLOROTHIAZIDE 20-12.5 MG PO TABS: 1.0000 | ORAL_TABLET | Freq: Every day | ORAL | 3 refills | Status: DC

## 2019-04-26 MED ORDER — FLUOXETINE HCL 20 MG PO TABS: 20.0000 mg | ORAL_TABLET | Freq: Every day | ORAL | 5 refills | Status: DC

## 2019-04-26 MED ORDER — MELATONIN 5 MG PO TABS: 5.0000 mg | ORAL_TABLET | Freq: Every day | ORAL | 5 refills | Status: DC

## 2019-04-26 MED ORDER — ATORVASTATIN CALCIUM 40 MG PO TABS: 40.0000 mg | ORAL_TABLET | Freq: Every day | ORAL | 3 refills | Status: DC

## 2019-04-26 MED ORDER — IBUPROFEN 800 MG PO TABS: 600.0000 mg | ORAL_TABLET | Freq: Three times a day (TID) | ORAL | 1 refills | Status: DC | PRN

## 2019-04-26 MED FILL — LISINOPRIL-HCTZ 20-12.5 MG: 20-12.5 | 30 days supply | Qty: 30 | Fill #0

## 2019-04-26 MED FILL — NITROGLYCERIN 0.4 MG TAB SL: 0.4 | 10 days supply | Qty: 25 | Fill #0

## 2019-04-26 MED FILL — ?ATORVASTATIN 40MG TABLET: 40 | 30 days supply | Qty: 30 | Fill #0

## 2019-04-26 NOTE — Telephone Encounter (Signed)
Follow up call placed to patient. LCSW informed pt that his medications have been transferred to [facility]. Pt confirmed receipt of medications and shared that he has mailed supportive documentation for the CAFA application this weekend, prior to the deadline of 04/27/2019.   Pt agreed to schedule a follow up appointment with LCSW for 05/11/2019. He would like to schedule an appointment with PCP regarding concerns with shoulder. LCSW sent message to [facility] front staff scheduler to contact pt. No additional concerns noted.

## 2019-04-29 ENCOUNTER — Other Ambulatory Visit: Payer: Self-pay

## 2019-04-29 ENCOUNTER — Encounter (INDEPENDENT_AMBULATORY_CARE_PROVIDER_SITE_OTHER): Payer: Self-pay | Admitting: Primary Care

## 2019-04-29 ENCOUNTER — Ambulatory Visit (INDEPENDENT_AMBULATORY_CARE_PROVIDER_SITE_OTHER): Payer: Self-pay | Admitting: Primary Care

## 2019-04-29 VITALS — BP 149/74 | HR 66 | Temp 97.3°F | Ht 66.0 in | Wt 173.6 lb

## 2019-04-29 DIAGNOSIS — M545 Low back pain, unspecified: Secondary | ICD-10-CM

## 2019-04-29 DIAGNOSIS — M25511 Pain in right shoulder: Secondary | ICD-10-CM

## 2019-04-29 DIAGNOSIS — G8929 Other chronic pain: Secondary | ICD-10-CM

## 2019-04-29 NOTE — Progress Notes (Signed)
Acute Office Visit  Subjective:  Patient is in today for acute visit chronic right shoulder pain. Previously had surgery on on shoulder can not tell me what type of surgery done ie rotator cuff. He is unable to lift arm greater than 30 degrees without assistance. Right handed dominant which is now creating issues with ADL's. Pain 10/10 consistently and feels numb from neck down to the shoulder.   Past Medical History:  Diagnosis Date  . Chronic back pain   . Coronary artery disease   . Diabetes mellitus without complication [facility](HCC)     Past Surgical History:  Procedure Laterality Date  . BACK SURGERY    . SHOULDER SURGERY      Family History  Problem Relation Age of Onset  . Heart disease Maternal Uncle     Social History   Socioeconomic History  . Marital status: Single    Spouse name: Not on file  . Number of children: Not on file  . Years of education: Not on file  . Highest education level: Not on file  Occupational History  . Not on file  Tobacco Use  . Smoking status: Never Smoker  . Smokeless tobacco: Never Used  Substance and Sexual Activity  . Alcohol use: Yes    Comment: occasional  . Drug use: Yes    Types: Marijuana  . Sexual activity: Not on file  Other Topics Concern  . Not on file  Social History Narrative  . Not on file   Social Determinants of Health   Financial Resource Strain:   . Difficulty of Paying Living Expenses: Not on file  Food Insecurity:   . Worried About Programme researcher, broadcasting/film/videounning Out of Food in the Last Year: Not on file  . Ran Out of Food in the Last Year: Not on file  Transportation Needs:   . Lack of Transportation (Medical): Not on file  . Lack of Transportation (Non-Medical): Not on file  Physical Activity:   . Days of Exercise per Week: Not on file  . Minutes of Exercise per Session: Not on file  Stress:   . Feeling of Stress : Not on file  Social Connections:   . Frequency of Communication with Friends and Family: Not on file  .  Frequency of Social Gatherings with Friends and Family: Not on file  . Attends Religious Services: Not on file  . Active Member of Clubs or Organizations: Not on file  . Attends BankerClub or Organization Meetings: Not on file  . Marital Status: Not on file  Intimate Partner Violence:   . Fear of Current or Ex-Partner: Not on file  . Emotionally Abused: Not on file  . Physically Abused: Not on file  . Sexually Abused: Not on file    Outpatient Medications Prior to Visit  Medication Sig Dispense Refill  . aspirin 81 MG EC tablet Take 81 mg by mouth daily.    Marland Kitchen. atorvastatin (LIPITOR) 40 MG tablet Take 1 tablet (40 mg total) by mouth daily at 6 PM. 90 tablet 3  . FLUoxetine (PROZAC) 20 MG tablet Take 1 tablet (20 mg total) by mouth daily. 30 tablet 5  . ibuprofen (ADVIL) 800 MG tablet Take 1 tablet (800 mg total) by mouth every 8 (eight) hours as needed for moderate pain. 90 tablet 1  . lisinopril-hydrochlorothiazide (ZESTORETIC) 20-12.5 MG tablet Take 1 tablet by mouth daily. 90 tablet 3  . Melatonin 5 MG TABS Take 1 tablet (5 mg total) by mouth at bedtime. 30  tablet 5  . nitroGLYCERIN (NITROSTAT) 0.4 MG SL tablet Place 1 tablet (0.4 mg total) under the tongue every 5 (five) minutes as needed for chest pain. 75 tablet 1   No facility-administered medications prior to visit.    No Known Allergies  Review of Systems  Constitutional: Positive for activity change.  Musculoskeletal: Positive for back pain, neck pain and neck stiffness.       Shoulder pain  Neurological: Positive for weakness.  Psychiatric/Behavioral: Positive for sleep disturbance.  All other systems reviewed and are negative.      Objective:    Physical Exam Vitals reviewed.  Constitutional:      Appearance: Normal appearance.  Cardiovascular:     Rate and Rhythm: Normal rate and regular rhythm.  Pulmonary:     Effort: Pulmonary effort is normal.     Breath sounds: Normal breath sounds.  Abdominal:     General:  Abdomen is flat.     Palpations: Abdomen is soft.  Musculoskeletal:        General: Normal range of motion.     Cervical back: Normal range of motion and neck supple.  Skin:    General: Skin is warm and dry.  Neurological:     Mental Status: He is alert and oriented to person, place, and time.  Psychiatric:        Mood and Affect: Mood normal.        Thought Content: Thought content normal.        Judgment: Judgment normal.     BP (!) 149/74 (BP Location: Left Arm, Patient Position: Sitting, Cuff Size: Normal)   Pulse 66   Temp (!) 97.3 F (36.3 C) (Temporal)   Ht 5\' 6"  (1.676 m)   Wt 173 lb 9.6 oz (78.7 kg)   SpO2 94%   BMI 28.02 kg/m  Wt Readings from Last 3 Encounters:  04/29/19 173 lb 9.6 oz (78.7 kg)  03/24/19 173 lb 6.4 oz (78.7 kg)  03/16/19 176 lb 6.4 oz (80 kg)    Health Maintenance Due  Topic Date Due  . COLONOSCOPY  09/26/2015    There are no preventive care reminders to display for this patient.   No results found for: TSH Lab Results  Component Value Date   WBC 10.0 02/23/2019   HGB 14.7 02/23/2019   HCT 41.0 02/23/2019   MCV 92.3 02/23/2019   PLT 302 02/23/2019   Lab Results  Component Value Date   NA 141 02/23/2019   K 4.2 02/23/2019   CO2 27 02/23/2019   GLUCOSE 98 02/23/2019   BUN 8 02/23/2019   CREATININE 0.49 (L) 02/23/2019   CALCIUM 9.1 02/23/2019   ANIONGAP 9 02/23/2019   Lab Results  Component Value Date   CHOL 170 02/23/2019   Lab Results  Component Value Date   HDL 46 02/23/2019   Lab Results  Component Value Date   LDLCALC 113 (H) 02/23/2019   Lab Results  Component Value Date   TRIG 56 02/23/2019   Lab Results  Component Value Date   CHOLHDL 3.7 02/23/2019   Lab Results  Component Value Date   HGBA1C 4.9 02/23/2019       Assessment & Plan:  Scott Tucker was seen today for shoulder pain.  Diagnoses and all orders for this visit:  Chronic right shoulder pain Decrease range of motion affecting living decrease  ability to perform ADL's. Neck to shoulder pain with numbness and tingling .  -     AMB  referral to orthopedics  Midline low back pain without sciatica, unspecified chronicity Chronic back pain previously had lumbar surgery in [place], [place]. BACK PAIN  Location: middle  Quality:10/10 Onset: consent Worse with: bending , sitting , standing     Better with: none Radiation: pulsates to thighs  Trauma: 2 previous surgery Best sitting/standing/leaning forward: no  Red Flags Fecal/urinary incontinence: no Numbness/Weakness: no Fever/chills/sweats: no Night pain: yes Unexplained weight loss: no No relief with bedrest: no  No orders of the defined types were placed in this encounter.    Kerin Perna, NP

## 2019-04-29 NOTE — Progress Notes (Signed)
Limited ROM in right shoulder  Cant lay on right side  Pain radiates from right lower neck to shoulder

## 2019-04-29 NOTE — Patient Instructions (Signed)
Shoulder Pain Many things can cause shoulder pain, including:  An injury.  Moving the shoulder in the same way again and again (overuse).  Joint pain (arthritis). Pain can come from:  Swelling and irritation (inflammation) of any part of the shoulder.  An injury to the shoulder joint.  An injury to: ? Tissues that connect muscle to bone (tendons). ? Tissues that connect bones to each other (ligaments). ? Bones. Follow these instructions at home: Watch for changes in your symptoms. Let your doctor know about them. Follow these instructions to help with your pain. If you have a sling:  Wear the sling as told by your doctor. Remove it only as told by your doctor.  Loosen the sling if your fingers: ? Tingle. ? Become numb. ? Turn cold and blue.  Keep the sling clean.  If the sling is not waterproof: ? Do not let it get wet. ? Take the sling off when you shower or bathe. Managing pain, stiffness, and swelling   If told, put ice on the painful area: ? Put ice in a plastic bag. ? Place a towel between your skin and the bag. ? Leave the ice on for 20 minutes, 2-3 times a day. Stop putting ice on if it does not help with the pain.  Squeeze a soft ball or a foam pad as much as possible. This prevents swelling in the shoulder. It also helps to strengthen the arm. General instructions  Take over-the-counter and prescription medicines only as told by your doctor.  Keep all follow-up visits as told by your doctor. This is important. Contact a doctor if:  Your pain gets worse.  Medicine does not help your pain.  You have new pain in your arm, hand, or fingers. Get help right away if:  Your arm, hand, or fingers: ? Tingle. ? Are numb. ? Are swollen. ? Are painful. ? Turn white or blue. Summary  Shoulder pain can be caused by many things. These include injury, moving the shoulder in the same away again and again, and joint pain.  Watch for changes in your symptoms.  Let your doctor know about them.  This condition may be treated with a sling, ice, and pain medicine.  Contact your doctor if the pain gets worse or you have new pain. Get help right away if your arm, hand, or fingers tingle or get numb, swollen, or painful.  Keep all follow-up visits as told by your doctor. This is important. This information is not intended to replace advice given to you by your health care provider. Make sure you discuss any questions you have with your health care provider. Document Released: 10/16/2007 Document Revised: 11/11/2017 Document Reviewed: 11/11/2017 Elsevier Patient Education  2020 Elsevier Inc.  

## 2019-05-02 NOTE — Progress Notes (Signed)
Virtual Visit via Telephone Note  I connected with Scott Tucker on 05/02/19 at 10:50 AM EST by telephone and verified that I am speaking with the correct person using two identifiers.   I discussed the limitations, risks, security and privacy concerns of performing an evaluation and management service by telephone and the availability of in person appointments. I also discussed with the patient that there may be a patient responsible charge related to this service. The patient expressed understanding and agreed to proceed.   History of Present Illness: [name] is having a web visit follow on medication for depression. He voiced no complaints, problems or concerns or adverse effects with the medication.   Past Medical History:  Diagnosis Date  . Chronic back pain   . Coronary artery disease   . Diabetes mellitus without complication [facility])    Current Outpatient Medications on File Prior to Visit  Medication Sig Dispense Refill  . aspirin 81 MG EC tablet Take 81 mg by mouth daily.     No current facility-administered medications on file prior to visit.   Observations/Objective: Review of Systems  All other systems reviewed and are negative.   Assessment and Plan: Diagnoses and all orders for this visit:  Primary insomnia -Dicussed quite environment, not TV and decrease distractions before bed time, and currently taking melatonin 5mg -15 mg at night for sleep, can also do benadryl  25-50mg  at night for sleep.  -If this does not help we can try prescription medication  Depression, unspecified depression type - Currently managed on Prozac, monitoring for adverse effects. Encouraged to not stop medication abruptly, if an increase in energy is noted please notify office.   Other orders : FLUoxetine (PROZAC) 20 MG tablet; Take 1 tablet (20 mg total) by mouth daily. -      Melatonin 5 MG TABS; Take 1 tablet (5 mg total) by mouth at bedtime. -     : ibuprofen (ADVIL) 800 MG  tablet; Take 1 tablet (800 mg total) by mouth every 8 (eight) hours as needed for moderate pain.    Follow Up Instructions:    I discussed the assessment and treatment plan with the patient. The patient was provided an opportunity to ask questions and all were answered. The patient agreed with the plan and demonstrated an understanding of the instructions.   The patient was advised to call back or seek an in-person evaluation if the symptoms worsen or if the condition fails to improve as anticipated.  I provided 12 minutes of non-face-to-face time during this encounter.   Kerin Perna, NP

## 2019-05-11 ENCOUNTER — Ambulatory Visit (INDEPENDENT_AMBULATORY_CARE_PROVIDER_SITE_OTHER): Payer: Self-pay | Admitting: Licensed Clinical Social Worker

## 2019-05-11 ENCOUNTER — Other Ambulatory Visit: Payer: Self-pay

## 2019-05-11 DIAGNOSIS — F4323 Adjustment disorder with mixed anxiety and depressed mood: Secondary | ICD-10-CM

## 2019-05-12 ENCOUNTER — Other Ambulatory Visit: Payer: Self-pay

## 2019-05-12 ENCOUNTER — Ambulatory Visit (INDEPENDENT_AMBULATORY_CARE_PROVIDER_SITE_OTHER): Payer: Self-pay

## 2019-05-12 ENCOUNTER — Ambulatory Visit (INDEPENDENT_AMBULATORY_CARE_PROVIDER_SITE_OTHER): Payer: Self-pay | Admitting: Orthopaedic Surgery

## 2019-05-12 DIAGNOSIS — G8929 Other chronic pain: Secondary | ICD-10-CM

## 2019-05-12 DIAGNOSIS — M25511 Pain in right shoulder: Secondary | ICD-10-CM

## 2019-05-12 MED ORDER — TIZANIDINE HCL 4 MG PO TABS: 4.0000 mg | ORAL_TABLET | Freq: Three times a day (TID) | ORAL | 1 refills | Status: DC | PRN

## 2019-05-12 MED ORDER — METHYLPREDNISOLONE ACETATE 40 MG/ML IJ SUSP
40.0000 mg | INTRAMUSCULAR | Status: AC | PRN
  Administered 2019-05-12: 40 mg via INTRA_ARTICULAR

## 2019-05-12 MED ORDER — TRAMADOL HCL 50 MG PO TABS: 50.0000 mg | ORAL_TABLET | Freq: Four times a day (QID) | ORAL | 0 refills | Status: DC | PRN

## 2019-05-12 MED ORDER — LIDOCAINE HCL 1 % IJ SOLN
3.0000 mL | INTRAMUSCULAR | Status: AC | PRN
  Administered 2019-05-12: 3 mL

## 2019-05-12 NOTE — Progress Notes (Signed)
Office Visit Note   Patient: Scott Tucker           Date of Birth: 1965/06/21           MRN: [id number] Visit Date: 05/12/2019              Requested by: Scott Sessions, NP [address] [place],  [place] [address] PCP: Scott Sessions, NP   Assessment & Plan: Visit Diagnoses:  1. Chronic right shoulder pain     Plan: Based on the pain he is having in his right shoulder I do feel it is worthwhile trying a steroid injection in the right shoulder and he agreed with this.  He tolerated it well.  I did explain the risk and benefits of steroid injections.  I am also can try him on a short course of tramadol and Zanaflex to see if this will help with his pain.  He would definitely benefit from outpatient physical therapy on both shoulders to hopefully improve the function range of motion of both shoulders and decreased pain.  We will see if that can be set up for him since he is part of the [facility] discount program.  From my standpoint, I will see him back in 4 weeks to see how he is doing overall.  All question concerns were answered and addressed.  Follow-Up Instructions: Return in about 4 weeks (around 06/09/2019).   Orders:  Orders Placed This Encounter  Procedures  . Large Joint Inj  . XR Shoulder Right   Meds ordered this encounter  Medications  . tiZANidine (ZANAFLEX) 4 MG tablet    Sig: Take 1 tablet (4 mg total) by mouth every 8 (eight) hours as needed for muscle spasms.    Dispense:  40 tablet    Refill:  1  . traMADol (ULTRAM) 50 MG tablet    Sig: Take 1-2 tablets (50-100 mg total) by mouth every 6 (six) hours as needed.    Dispense:  40 tablet    Refill:  0      Procedures: Large Joint Inj: R subacromial bursa on 05/12/2019 10:37 AM Indications: pain and diagnostic evaluation Details: 22 G 1.5 in needle  Arthrogram: No  Medications: 3 mL lidocaine 1 %; 40 mg methylPREDNISolone acetate 40 MG/ML Outcome: tolerated well, no immediate  complications Procedure, treatment alternatives, risks and benefits explained, specific risks discussed. Consent was given by the patient. Immediately prior to procedure a time out was called to verify the correct patient, procedure, equipment, support staff and site/side marked as required. Patient was prepped and draped in the usual sterile fashion.       Clinical Data: No additional findings.   Subjective: Chief Complaint  Patient presents with  . Right Shoulder - Pain  The patient is a very pleasant 53 year old sent [facility] the [facility] here in [place] to evaluate and treat bilateral shoulder pain.  The patient reports that his right hurts worse than left.  In 2018 he did have a shoulder surgery which was arthroscopic surgery in [facility].  This was on the right shoulder.  He said he had done very well after surgery and did not have any issues until a few months ago when he started developing more significant pain in his right shoulder.  He is also had some left shoulder pain.  He used to be a diabetic but is not anymore.  They took him off all diabetic medications since his hemoglobin A1c went  below 5.  He does have a history of cardiac disease and he cannot take anti-inflammatories.  He reports pain that does wake him up at night.  He has pain on the right shoulder that radiates up into the neck.  He denies any numbness and tingling in his hands.  HPI  Review of Systems He currently denies any headache, chest pain, shortness of breath, fever, chills, nausea, vomiting  Objective: Vital Signs: There were no vitals taken for this visit.  Physical Exam He is alert and orient x3 and in no acute distress Ortho Exam Examination of his left shoulder shows full and fluid range of motion with pain mainly at the [facility] joint and positive Neer and Hawkins signs with good strength and motion that is full.  Examination of his right shoulder shows  well-healed arthroscopy portal sites around the shoulder on the right side.  He has significant pain past any attempts at 90 degrees of abduction.  His external rotation as well as internal rotation with adduction is limited a lot of this is secondary to his pain.  The shoulder is well located bilaterally.  There is no evidence of muscle atrophy. Specialty Comments:  No specialty comments available.  Imaging: XR Shoulder Right  Result Date: 05/12/2019 3 views of the right shoulder show no acute findings.  There is evidence of a previous distal clavicle resection.    PMFS History: Patient Active Problem List   Diagnosis Date Noted  . CAD (coronary artery disease) 03/22/2019  . Hypertension 02/23/2019  . Hyperlipidemia 02/23/2019  . Chest pain 02/22/2019   Past Medical History:  Diagnosis Date  . Chronic back pain   . Coronary artery disease   . Diabetes mellitus without complication ([place])     Family History  Problem Relation Age of Onset  . Heart disease Maternal Uncle     Past Surgical History:  Procedure Laterality Date  . BACK SURGERY    . SHOULDER SURGERY     Social History   Occupational History  . Not on file  Tobacco Use  . Smoking status: Never Smoker  . Smokeless tobacco: Never Used  Substance and Sexual Activity  . Alcohol use: Yes    Comment: occasional  . Drug use: Yes    Types: Marijuana  . Sexual activity: Not on file

## 2019-05-17 ENCOUNTER — Other Ambulatory Visit: Payer: Self-pay

## 2019-05-17 ENCOUNTER — Ambulatory Visit: Payer: Self-pay | Attending: Family Medicine

## 2019-05-20 NOTE — BH Specialist Note (Signed)
Integrated Behavioral Health Visit via Telemedicine (Telephone)  05/11/2019 Scott Tucker [id number]   Session Start time: 10:20 AM  Session End time: 10:35 AM Total time: 15  Referring Provider: NP Randa Evens Type of Visit: Telephonic Patient location: [facility] Provider location: Office All persons participating in visit: LCSW and Patient  Confirmed patient's address: Yes  Confirmed patient's phone number: Yes  Any changes to demographics: No   Confirmed patient's insurance: Yes  Any changes to patient's insurance: No   Discussed confidentiality: Yes    The following statements were read to the patient and/or legal guardian that are established with the [facility] Provider.  "The purpose of this phone visit is to provide behavioral health care while limiting exposure to the coronavirus (COVID19).  There is a possibility of technology failure and discussed alternative modes of communication if that failure occurs."  "By engaging in this telephone visit, you consent to the provision of healthcare.  Additionally, you authorize for your insurance to be billed for the services provided during this telephone visit."   Patient and/or legal guardian consented to telephone visit: Yes   PRESENTING CONCERNS: Patient and/or family reports the following symptoms/concerns: Pt reports difficulty managing mental and physical health conditions. He is compliant with medications prescribed by PCP, states no change in pain level. Pt has been taking Prozac for approximately 2 weeks Duration of problem: Ongoing; Severity of problem: severe  STRENGTHS (Protective Factors/Coping Skills): Pt receives support from girlfriend Pt is compliant with medication management Pt has scheduled appointment with Financial Counseling to assist with medical costs  GOALS ADDRESSED: Patient will: 1.  Reduce symptoms of: agitation, anxiety and depression  2.  Increase knowledge and/or ability of:  self-management skills  3.  Demonstrate ability to: Increase healthy adjustment to current life circumstances, Increase adequate support systems for patient/family and Increase motivation to adhere to plan of care  INTERVENTIONS: Interventions utilized:  Solution-Focused Strategies and Supportive Counseling Standardized Assessments completed: Not Needed  ASSESSMENT: Patient currently experiencing difficulty managing mental health triggered by chronic pain and ongoing medical conditions.   Patient may benefit from medication management and brief therapy. LCSW commended patient on compliance to medication management and willingness to follow up with Financial Counseling to assist with medical costs.  Psychoeducation regarding medication was discussed, pt did not report any adverse side effects.   PLAN: 1. Follow up with behavioral health clinician on : Follow up with LCSW regarding mental health and/or resource needs 2. Behavioral recommendations: Continue medication compliance, follow up with [facility] and Financial Counseling appointments 3. Referral(s): Integrated [employer] (In Clinic)  Bridgett Larsson, [place] 05/20/2019 4:21 PM

## 2019-05-31 ENCOUNTER — Ambulatory Visit (INDEPENDENT_AMBULATORY_CARE_PROVIDER_SITE_OTHER): Payer: Self-pay | Admitting: Physical Therapy

## 2019-05-31 ENCOUNTER — Other Ambulatory Visit: Payer: Self-pay

## 2019-05-31 ENCOUNTER — Encounter: Payer: Self-pay | Admitting: Physical Therapy

## 2019-05-31 DIAGNOSIS — M25511 Pain in right shoulder: Secondary | ICD-10-CM

## 2019-05-31 DIAGNOSIS — G8929 Other chronic pain: Secondary | ICD-10-CM

## 2019-05-31 DIAGNOSIS — M6281 Muscle weakness (generalized): Secondary | ICD-10-CM

## 2019-05-31 DIAGNOSIS — M25512 Pain in left shoulder: Secondary | ICD-10-CM

## 2019-05-31 MED FILL — ?ATORVASTATIN 40MG TABLET: 40 | 30 days supply | Qty: 30 | Fill #0

## 2019-05-31 MED FILL — LISINOPRIL-HCTZ 20-12.5 MG: 20-12.5 | 30 days supply | Qty: 30 | Fill #0

## 2019-05-31 NOTE — Therapy (Signed)
[facility] Physical Therapy [address] [place], [place], [address] Phone: [phone]   Fax:  [phone]  Physical Therapy Evaluation  Patient Details  Name: Scott Tucker MRN: [id number] Date of Birth: [birth year] Referring Provider (PT): Mcarthur Rossetti, MD   Encounter Date: 05/31/2019  PT End of Session - 05/31/19 1326    Visit Number  1    Number of Visits  12    Date for PT Re-Evaluation  07/12/19    PT Start Time  1105    PT Stop Time  1145    PT Time Calculation (min)  40 min    Activity Tolerance  Patient tolerated treatment well    Behavior During Therapy  [facility] for tasks assessed/performed       Past Medical History:  Diagnosis Date  . Chronic back pain   . Coronary artery disease   . Diabetes mellitus without complication [facility])     Past Surgical History:  Procedure Laterality Date  . BACK SURGERY    . SHOULDER SURGERY      There were no vitals filed for this visit.   Subjective Assessment - 05/31/19 1023    Subjective  chronic bilat shoulder pain, had recent injection which helped a little bit. He first injured his shoulder in 2018 when he was lifting heavy objects over his shoulder. He had Rt shoulder scope in 2018. His Left shoulder has been. bothering him since 2018. He relays loss of ROM making it painful for grooming hair, reaching up or behing his back or head. He is not sure if anything helps with the pain.    Pertinent History  PMH: Rt shoulder scope with DCR 2018, CAD, HTN,CAD,DM    Limitations  Lifting;House hold activities    Diagnostic tests  XR 05/02/19: "3 views of the right shoulder show no acute findings.  There is evidence of a previous distal clavicle resection."    Patient Stated Goals  get the pain down, be able to reach up better    Currently in Pain?  Yes    Pain Score  7     Pain Location  Shoulder    Pain Orientation  Right;Left    Pain Descriptors / Indicators  Aching;Tightness    Pain Type  Chronic  pain    Pain Radiating Towards  to his neck, denies N/T down his arm    Pain Onset  More than a month ago    Pain Frequency  Constant    Aggravating Factors   reaching,    Pain Relieving Factors  nothing relieves for long, short relief with meds, biofreeze         [facility] PT Assessment - 05/31/19 0001      Assessment   Medical Diagnosis  Chronic bilat shoulder pain    Referring Provider (PT)  Mcarthur Rossetti, MD    Onset Date/Surgical Date  --   2018 Rt shoulder scope, 2019 onset of Lt shoulder pain   Hand Dominance  Right    Next MD Visit  next week    Prior Therapy  no recent PT      Precautions   Precautions  None      Restrictions   Weight Bearing Restrictions  No      Balance Screen   Has the patient fallen in the past 6 months  No      [place] residence    Additional Comments  lives alone,  mostly independnet but gets help from girlfriend if his shoulder hurts too much      Prior Function   Level of Independence  Independent    Vocation  Unemployed      Cognition   Overall Cognitive Status  Within Functional Limits for tasks assessed      Sensation   Light Touch  Appears Intact      Coordination   Gross Motor Movements are Fluid and Coordinated  Yes      ROM / Strength   AROM / PROM / Strength  AROM;Strength;PROM      AROM   AROM Assessment Site  Shoulder    Right/Left Shoulder  Right;Left    Right Shoulder Flexion  70 Degrees   shrug sign   Right Shoulder ABduction  85 Degrees   shrug sign   Right Shoulder Internal Rotation  --   L3   Right Shoulder External Rotation  --   C3   Left Shoulder Flexion  155 Degrees    Left Shoulder ABduction  130 Degrees    Left Shoulder Internal Rotation  --   WNL   Left Shoulder External Rotation  --   WNL     PROM   Overall PROM Comments  PROM WFL but limited about 20%, uanable to assess end feel as pt has significant muscle guarding      Strength   Strength  Assessment Site  Shoulder    Right/Left Shoulder  Right;Left    Right Shoulder Flexion  3-/5    Right Shoulder ABduction  3-/5    Right Shoulder Internal Rotation  4/5    Right Shoulder External Rotation  3/5    Left Shoulder Flexion  4/5    Left Shoulder ABduction  3+/5    Left Shoulder Internal Rotation  4+/5    Left Shoulder External Rotation  4+/5      Palpation   Palpation comment  TTP in Rt upper trap supraspinatus, and bilat deltoids      Special Tests   Other special tests  neg drop arm test for large RTC tear but concern for smaller RTC tear due to signifcant weakness and shrug sign      Transfers   Transfers  Independent with all Transfers                Objective measurements completed on examination: See above findings.              PT Education - 05/31/19 1326    Education Details  HEP, POC    Person(s) Educated  Patient    Methods  Explanation;Demonstration;Verbal cues;Handout    Comprehension  Verbalized understanding;Need further instruction          PT Long Term Goals - 05/31/19 1335      PT LONG TERM GOAL #1   Title  Pt will be I and compliant with HEP. (target for all LTG 6 weeks 07/12/19)    Status  New      PT LONG TERM GOAL #2   Title  Pt will improve shoulder AROM to [facility] (at least 145 deg flexion and abuction)    Status  New      PT LONG TERM GOAL #3   Title  Pt will decrease pain to overall less than 5/10 with ususal activity    Status  New      PT LONG TERM GOAL #4   Title  Pt will improve bilat shoulder strength to  4+/5 to improve function.    Status  New             Plan - 05/31/19 1327    Clinical Impression Statement  Pt presents with chronic bilat shoulder pain, Rt is worse than Lt. He has history of Rt shoulder scope with DCR in 2018. He has significant pain and RTC weakness in his Rt shoulder with concern for RTC tear. He lacks overall shoulder ROM, strength, inability to reach up, behind his back, or  behind his head, he cannot lay on his sides to sleep and he does not like to lay on his back due to back pain.  He may benefit from further diagnostic imaging/U.S. if he does not improve with PT and HEP.    Personal Factors and Comorbidities  Comorbidity 3+;Past/Current Experience;Time since onset of injury/illness/exacerbation    Comorbidities  PMH: Rt shoulder scope with DCR 2018, CAD, HTN,CAD,DM    Examination-Activity Limitations  Carry;Lift;Dressing;Reach Overhead    Examination-Participation Restrictions  Cleaning;Community Activity;Driving;Laundry;Yard Work    Conservation officer, historic buildings  Evolving/Moderate complexity    Clinical Decision Making  Moderate    Rehab Potential  Fair    PT Frequency  2x / week   1-2   PT Duration  12 weeks    PT Treatment/Interventions  ADLs/Self Care Home Management;Cryotherapy;Electrical Stimulation;Iontophoresis 4mg /ml Dexamethasone;Moist Heat;Ultrasound;Neuromuscular re-education;Therapeutic exercise;Therapeutic activities;Manual techniques;Dry needling;Passive range of motion;Taping;Joint Manipulations    PT Next Visit Plan  needs bilat shoulder ROM and gentle strength, there is concern for RTC tear?    PT Home Exercise Plan  Access Code: [email or web address]    Consulted and Agree with Plan of Care  Patient       Patient will benefit from skilled therapeutic intervention in order to improve the following deficits and impairments:  Decreased activity tolerance, Decreased mobility, Decreased range of motion, Decreased strength, Increased muscle spasms, Pain  Visit Diagnosis: Chronic right shoulder pain  Chronic left shoulder pain  Muscle weakness (generalized)     Problem List Patient Active Problem List   Diagnosis Date Noted  . CAD (coronary artery disease) 03/22/2019  . Hypertension 02/23/2019  . Hyperlipidemia 02/23/2019  . Chest pain 02/22/2019    04/24/2019 05/31/2019, 1:37 PM  [facility] Physical Therapy [address] [place], [place], [place] Phone: [phone]   Fax:  [phone]  Name: Scott Tucker MRN: [name] Date of Birth: September 09, 1965

## 2019-05-31 NOTE — Patient Instructions (Signed)
Access Code: [email or web address]  URL: [email or web address]  Date: 05/31/2019  Prepared by: Ivery Quale   Exercises  Circular Shoulder Pendulum with Table Support - 10 reps - 2 sets - 3x daily - 7x weekly  Shoulder Scaption AAROM with Dowel - 10 reps - 1 sets - 3x daily - 7x weekly  Seated Shoulder Abduction Towel Slide at Table Top - 10 reps - 1-2 sets - 5 hold - 2x daily - 6x weekly  Seated Shoulder External Rotation PROM on Table - 10 reps - 1-2 sets - 5 hold - 2-3x daily - 6x weekly  Shoulder Internal Rotation with Resistance - 10 reps - 2-3 sets - 2x daily - 6x weekly  Standing Shoulder External Rotation with Resistance - 10 reps - 1-3 sets - 2x daily - 6x weekly  Standing Row with Resistance - 10 reps - 2-3 sets - 2x daily - 6x weekly

## 2019-05-31 NOTE — Addendum Note (Signed)
Addended by: April Manson on: 05/31/2019 01:39 PM   Modules accepted: Orders

## 2019-06-01 ENCOUNTER — Telehealth: Payer: Self-pay | Admitting: Primary Care

## 2019-06-01 NOTE — Telephone Encounter (Signed)
Pt only apply for OC, Pt was sent a letter from financial dept. Inform them, that the application they submitted was incomplete, since they were missing some documentation at the time of the appointment, Pt need to reschedule and resubmit all new papers and application for CAFA and OC, P.S. old documents has been sent back by mail to the Pt and Pt. need to make a new appt

## 2019-06-09 ENCOUNTER — Ambulatory Visit (INDEPENDENT_AMBULATORY_CARE_PROVIDER_SITE_OTHER): Payer: Self-pay | Admitting: Orthopaedic Surgery

## 2019-06-09 ENCOUNTER — Other Ambulatory Visit: Payer: Self-pay

## 2019-06-09 ENCOUNTER — Encounter: Payer: Self-pay | Admitting: Orthopaedic Surgery

## 2019-06-09 DIAGNOSIS — G8929 Other chronic pain: Secondary | ICD-10-CM

## 2019-06-09 DIAGNOSIS — M25511 Pain in right shoulder: Secondary | ICD-10-CM

## 2019-06-09 DIAGNOSIS — M25512 Pain in left shoulder: Secondary | ICD-10-CM

## 2019-06-09 DIAGNOSIS — Z96611 Presence of right artificial shoulder joint: Secondary | ICD-10-CM

## 2019-06-09 NOTE — Progress Notes (Signed)
The patient comes in with continued severe right shoulder pain and some left shoulder pain.  He is someone referred [facility].  He has remote history of right shoulder surgery done in [facility].  From his previous x-rays we can see that there was at least a distal clavicle resection and subacromial decompression.  He reports constant pain in his shoulder and is not getting better at all.  When I saw him at his last visit I placed a steroid injection to the subacromial space and he says that this did not help him at all.  He also went to physical therapy and the therapist felt that they were not making progress with him.  The therapist also feel that he needs an additional study to rule out a rotator cuff tear.  I agree with this now given the chronic nature of his pain.  On exam, his right shoulder difficult exam because he is a lot of guarding.  It is clinically well located and I can put him through internal and external rotation but due to the chronic nature of his pain is a quite difficult exam.  At this point I do feel a MRI is warranted of the right shoulder to assess the cartilage in the shoulder as well as the rotator cuff.  I am concerned given the fact that he has chronic pain in the shoulder and an injection did not even help him even a little bit.  I am at a loss of what else we can do for him until we know what may be going on with his shoulder.  Thus, a MRI is a useful clinical and diagnostic tool at this point.  Once he has the MRI he will call us for follow-up appointment.

## 2019-06-10 ENCOUNTER — Encounter: Payer: Medicaid Other | Admitting: Physical Therapy

## 2019-06-16 ENCOUNTER — Ambulatory Visit (INDEPENDENT_AMBULATORY_CARE_PROVIDER_SITE_OTHER): Payer: Self-pay | Admitting: Primary Care

## 2019-06-16 ENCOUNTER — Encounter (INDEPENDENT_AMBULATORY_CARE_PROVIDER_SITE_OTHER): Payer: Self-pay | Admitting: Primary Care

## 2019-06-16 ENCOUNTER — Other Ambulatory Visit: Payer: Self-pay

## 2019-06-16 VITALS — BP 151/85 | HR 66 | Temp 97.3°F | Ht 66.0 in | Wt 179.0 lb

## 2019-06-16 DIAGNOSIS — E785 Hyperlipidemia, unspecified: Secondary | ICD-10-CM

## 2019-06-16 DIAGNOSIS — F32A Depression, unspecified: Secondary | ICD-10-CM

## 2019-06-16 DIAGNOSIS — F329 Major depressive disorder, single episode, unspecified: Secondary | ICD-10-CM

## 2019-06-16 DIAGNOSIS — M545 Low back pain, unspecified: Secondary | ICD-10-CM

## 2019-06-16 DIAGNOSIS — I10 Essential (primary) hypertension: Secondary | ICD-10-CM

## 2019-06-16 MED ORDER — AMLODIPINE BESYLATE 10 MG PO TABS: 10.0000 mg | ORAL_TABLET | Freq: Every day | ORAL | 1 refills | Status: DC

## 2019-06-16 MED ORDER — ATORVASTATIN CALCIUM 40 MG PO TABS: 40.0000 mg | ORAL_TABLET | Freq: Every day | ORAL | 1 refills | Status: DC

## 2019-06-16 MED ORDER — FLUOXETINE HCL 20 MG PO TABS: 20.0000 mg | ORAL_TABLET | Freq: Every day | ORAL | 1 refills | Status: DC

## 2019-06-16 MED FILL — FLUoxetine HCL 20 MG TABS: 20 | 30 days supply | Qty: 30 | Fill #0

## 2019-06-16 MED FILL — AMLODIPINE BESYLATE 10 MG T: 10 | 30 days supply | Qty: 30 | Fill #0

## 2019-06-16 NOTE — Patient Instructions (Signed)
   Managing Your Hypertension Hypertension is commonly called high blood pressure. This is when the force of your blood pressing against the walls of your arteries is too strong. Arteries are blood vessels that carry blood from your heart throughout your body. Hypertension forces the heart to work harder to pump blood, and may cause the arteries to become narrow or stiff. Having untreated or uncontrolled hypertension can cause heart attack, stroke, kidney disease, and other problems. What are blood pressure readings? A blood pressure reading consists of a higher number over a lower number. Ideally, your blood pressure should be below 120/80. The first ("top") number is called the systolic pressure. It is a measure of the pressure in your arteries as your heart beats. The second ("bottom") number is called the diastolic pressure. It is a measure of the pressure in your arteries as the heart relaxes. What does my blood pressure reading mean? Blood pressure is classified into four stages. Based on your blood pressure reading, your health care provider may use the following stages to determine what type of treatment you need, if any. Systolic pressure and diastolic pressure are measured in a unit called mm Hg. Normal  Systolic pressure: below 120.  Diastolic pressure: below 80. Elevated  Systolic pressure: 120-129.  Diastolic pressure: below 80. Hypertension stage 1  Systolic pressure: 130-139.  Diastolic pressure: 80-89. Hypertension stage 2  Systolic pressure: 140 or above.  Diastolic pressure: 90 or above. What health risks are associated with hypertension? Managing your hypertension is an important responsibility. Uncontrolled hypertension can lead to:  A heart attack.  A stroke.  A weakened blood vessel (aneurysm).  Heart failure.  Kidney damage.  Eye damage.  Metabolic syndrome.  Memory and concentration problems. What changes can I make to manage my  hypertension? Hypertension can be managed by making lifestyle changes and possibly by taking medicines. Your health care provider will help you make a plan to bring your blood pressure within a normal range. Eating and drinking   Eat a diet that is high in fiber and potassium, and low in salt (sodium), added sugar, and fat. An example eating plan is called the DASH (Dietary Approaches to Stop Hypertension) diet. To eat this way: ? Eat plenty of fresh fruits and vegetables. Try to fill half of your plate at each meal with fruits and vegetables. ? Eat whole grains, such as whole wheat pasta, brown rice, or whole grain bread. Fill about one quarter of your plate with whole grains. ? Eat low-fat diary products. ? Avoid fatty cuts of meat, processed or cured meats, and poultry with skin. Fill about one quarter of your plate with lean proteins such as fish, chicken without skin, beans, eggs, and tofu. ? Avoid premade and processed foods. These tend to be higher in sodium, added sugar, and fat.  Reduce your daily sodium intake. Most people with hypertension should eat less than 1,500 mg of sodium a day.  Limit alcohol intake to no more than 1 drink a day for nonpregnant women and 2 drinks a day for men. One drink equals 12 oz of beer, 5 oz of wine, or 1 oz of hard liquor. Lifestyle  Work with your health care provider to maintain a healthy body weight, or to lose weight. Ask what an ideal weight is for you.  Get at least 30 minutes of exercise that causes your heart to beat faster (aerobic exercise) most days of the week. Activities may include walking, swimming, or biking.    Include exercise to strengthen your muscles (resistance exercise), such as weight lifting, as part of your weekly exercise routine. Try to do these types of exercises for 30 minutes at least 3 days a week.  Do not use any products that contain nicotine or tobacco, such as cigarettes and e-cigarettes. If you need help quitting,  ask your health care provider.  Control any long-term (chronic) conditions you have, such as high cholesterol or diabetes. Monitoring  Monitor your blood pressure at home as told by your health care provider. Your personal target blood pressure may vary depending on your medical conditions, your age, and other factors.  Have your blood pressure checked regularly, as often as told by your health care provider. Working with your health care provider  Review all the medicines you take with your health care provider because there may be side effects or interactions.  Talk with your health care provider about your diet, exercise habits, and other lifestyle factors that may be contributing to hypertension.  Visit your health care provider regularly. Your health care provider can help you create and adjust your plan for managing hypertension. Will I need medicine to control my blood pressure? Your health care provider may prescribe medicine if lifestyle changes are not enough to get your blood pressure under control, and if:  Your systolic blood pressure is 130 or higher.  Your diastolic blood pressure is 80 or higher. Take medicines only as told by your health care provider. Follow the directions carefully. Blood pressure medicines must be taken as prescribed. The medicine does not work as well when you skip doses. Skipping doses also puts you at risk for problems. Contact a health care provider if:  You think you are having a reaction to medicines you have taken.  You have repeated (recurrent) headaches.  You feel dizzy.  You have swelling in your ankles.  You have trouble with your vision. Get help right away if:  You develop a severe headache or confusion.  You have unusual weakness or numbness, or you feel faint.  You have severe pain in your chest or abdomen.  You vomit repeatedly.  You have trouble breathing. Summary  Hypertension is when the force of blood pumping  through your arteries is too strong. If this condition is not controlled, it may put you at risk for serious complications.  Your personal target blood pressure may vary depending on your medical conditions, your age, and other factors. For most people, a normal blood pressure is less than 120/80.  Hypertension is managed by lifestyle changes, medicines, or both. Lifestyle changes include weight loss, eating a healthy, low-sodium diet, exercising more, and limiting alcohol. This information is not intended to replace advice given to you by your health care provider. Make sure you discuss any questions you have with your health care provider. Document Revised: 08/21/2018 Document Reviewed: 03/27/2016 Elsevier Patient Education  2020 [employer]  

## 2019-06-16 NOTE — Progress Notes (Signed)
Established Patient Office Visit  Subjective:  Patient ID: Scott Tucker, male    DOB: 08-Dec-1965  Age: 54 y.o. MRN: [id number]  CC:  Chief Complaint  Patient presents with  . Follow-up    HTN  . Shoulder Pain  . Back Pain    HPI Scott Tucker presents for management of blood pressure stopped taking lisinopril due to a cough which is a common side affect. Denies shortness of breath, , chest pain or lower extremity edema. Recently been having headaches pain felt behind right and pulsating. This was a problem 2 years ago and thought to be cluster headaches.  Complains of right shoulder pain followed by ortho Dr. Kathrynn Speed.  Past Medical History:  Diagnosis Date  . Chronic back pain   . Coronary artery disease   . Diabetes mellitus without complication [facility])     Past Surgical History:  Procedure Laterality Date  . BACK SURGERY    . SHOULDER SURGERY      Family History  Problem Relation Age of Onset  . Heart disease Maternal Uncle     Social History   Socioeconomic History  . Marital status: Single    Spouse name: Not on file  . Number of children: Not on file  . Years of education: Not on file  . Highest education level: Not on file  Occupational History  . Not on file  Tobacco Use  . Smoking status: Never Smoker  . Smokeless tobacco: Never Used  Substance and Sexual Activity  . Alcohol use: Yes    Comment: occasional  . Drug use: Yes    Types: Marijuana  . Sexual activity: Not on file  Other Topics Concern  . Not on file  Social History Narrative  . Not on file   Social Determinants of Health   Financial Resource Strain:   . Difficulty of Paying Living Expenses: Not on file  Food Insecurity:   . Worried About Charity fundraiser in the Last Year: Not on file  . Ran Out of Food in the Last Year: Not on file  Transportation Needs:   . Lack of Transportation (Medical): Not on file  . Lack of Transportation (Non-Medical): Not on file  Physical  Activity:   . Days of Exercise per Week: Not on file  . Minutes of Exercise per Session: Not on file  Stress:   . Feeling of Stress : Not on file  Social Connections:   . Frequency of Communication with Friends and Family: Not on file  . Frequency of Social Gatherings with Friends and Family: Not on file  . Attends Religious Services: Not on file  . Active Member of Clubs or Organizations: Not on file  . Attends Archivist Meetings: Not on file  . Marital Status: Not on file  Intimate Partner Violence:   . Fear of Current or Ex-Partner: Not on file  . Emotionally Abused: Not on file  . Physically Abused: Not on file  . Sexually Abused: Not on file    Outpatient Medications Prior to Visit  Medication Sig Dispense Refill  . aspirin 81 MG EC tablet Take 81 mg by mouth daily.    Marland Kitchen tiZANidine (ZANAFLEX) 4 MG tablet Take 1 tablet (4 mg total) by mouth every 8 (eight) hours as needed for muscle spasms. 40 tablet 1  . atorvastatin (LIPITOR) 40 MG tablet Take 1 tablet (40 mg total) by mouth daily at 6 PM. 90 tablet 3  .  FLUoxetine (PROZAC) 20 MG tablet Take 1 tablet (20 mg total) by mouth daily. 30 tablet 5  . Melatonin 5 MG TABS Take 1 tablet (5 mg total) by mouth at bedtime. 30 tablet 5  . nitroGLYCERIN (NITROSTAT) 0.4 MG SL tablet Place 1 tablet (0.4 mg total) under the tongue every 5 (five) minutes as needed for chest pain. 75 tablet 1  . ibuprofen (ADVIL) 800 MG tablet Take 1 tablet (800 mg total) by mouth every 8 (eight) hours as needed for moderate pain. (Patient not taking: Reported on 06/16/2019) 90 tablet 1  . lisinopril-hydrochlorothiazide (ZESTORETIC) 20-12.5 MG tablet Take 1 tablet by mouth daily. (Patient not taking: Reported on 06/16/2019) 90 tablet 3  . traMADol (ULTRAM) 50 MG tablet Take 1-2 tablets (50-100 mg total) by mouth every 6 (six) hours as needed. (Patient not taking: Reported on 06/16/2019) 40 tablet 0   No facility-administered medications prior to visit.     No Known Allergies  ROS Review of Systems  Musculoskeletal: Positive for back pain.       Right shoulder and low back pain (prior back surgery)  Neurological: Positive for headaches.  All other systems reviewed and are negative.     Objective:    Physical Exam  Constitutional: He is oriented to person, place, and time. He appears well-developed and well-nourished.  HENT:  Head: Normocephalic.  Cardiovascular: Normal rate and regular rhythm.  Pulmonary/Chest: Effort normal and breath sounds normal.  Abdominal: Soft. Bowel sounds are normal.  Musculoskeletal:        General: Normal range of motion.     Cervical back: Normal range of motion and neck supple.  Neurological: He is oriented to person, place, and time.  Skin: Skin is warm and dry.  Psychiatric: He has a normal mood and affect. His behavior is normal. Judgment and thought content normal.    BP (!) 151/85 (BP Location: Left Arm, Patient Position: Sitting, Cuff Size: Normal)   Pulse 66   Temp (!) 97.3 F (36.3 C) (Temporal)   Ht 5' 6"  (1.676 m)   Wt 179 lb (81.2 kg)   SpO2 94%   BMI 28.89 kg/m  Wt Readings from Last 3 Encounters:  06/16/19 179 lb (81.2 kg)  04/29/19 173 lb 9.6 oz (78.7 kg)  03/24/19 173 lb 6.4 oz (78.7 kg)     Health Maintenance Due  Topic Date Due  . URINE MICROALBUMIN  09/26/1975  . COLONOSCOPY  09/26/2015    There are no preventive care reminders to display for this patient.  No results found for: TSH Lab Results  Component Value Date   WBC 10.0 02/23/2019   HGB 14.7 02/23/2019   HCT 41.0 02/23/2019   MCV 92.3 02/23/2019   PLT 302 02/23/2019   Lab Results  Component Value Date   NA 141 02/23/2019   K 4.2 02/23/2019   CO2 27 02/23/2019   GLUCOSE 98 02/23/2019   BUN 8 02/23/2019   CREATININE 0.49 (L) 02/23/2019   CALCIUM 9.1 02/23/2019   ANIONGAP 9 02/23/2019   Lab Results  Component Value Date   CHOL 170 02/23/2019   Lab Results  Component Value Date   HDL 46  02/23/2019   Lab Results  Component Value Date   LDLCALC 113 (H) 02/23/2019   Lab Results  Component Value Date   TRIG 56 02/23/2019   Lab Results  Component Value Date   CHOLHDL 3.7 02/23/2019   Lab Results  Component Value Date   HGBA1C 4.9  02/23/2019      Assessment & Plan:  Murvin was seen today for follow-up, shoulder pain and back pain.  Diagnoses and all orders for this visit:  Depression, unspecified depression type Feeling down and fatigue can be signs of depression and unable to sleep. Discussed medication to decrease agitation and depression.  Midline low back pain without sciatica, unspecified chronicity Lumbar stenosis with neurogenic claudication  Is the cause of his chronic pain  Essential hypertension Counseled on blood pressure goal of less than 130/80, low-sodium, DASH diet, medication compliance, 150 minutes of moderate intensity exercise per week. Discussed medication compliance, adverse effects. Prescribed amlodipine 50m daily   Hyperlipidemia, unspecified hyperlipidemia type LDL elevated this can lead to heart attack and stroke. Decrease your fatty foods, red meat, cheese, milk and increase fiber like whole grains and veggies.  02/23/2019 LDL 113 .  -     CMP14+EGFR  Other orders -     FLUoxetine (PROZAC) 20 MG tablet; Take 1 tablet (20 mg total) by mouth daily. -     atorvastatin (LIPITOR) 40 MG tablet; Take 1 tablet (40 mg total) by mouth daily at 6 PM. -     amLODipine (NORVASC) 10 MG tablet; Take 1 tablet (10 mg total) by mouth daily.    Follow-up: Return in about 4 weeks (around 07/14/2019) for Bp tele visit follow up ( schedule appt with CSW depression).    MKerin Perna NP

## 2019-06-16 NOTE — Progress Notes (Signed)
Pt is able to check BP at home Not taking lisinopril-hctz because it causes him to have a cough

## 2019-06-17 LAB — CMP14+EGFR
ALT: 14 IU/L (ref 0–44)
AST: 15 IU/L (ref 0–40)
Albumin/Globulin Ratio: 1.9 (ref 1.2–2.2)
Albumin: 4.5 g/dL (ref 3.8–4.9)
Alkaline Phosphatase: 75 IU/L (ref 39–117)
BUN/Creatinine Ratio: 15 (ref 9–20)
BUN: 11 mg/dL (ref 6–24)
Bilirubin Total: 0.7 mg/dL (ref 0.0–1.2)
CO2: 22 mmol/L (ref 20–29)
Calcium: 9.4 mg/dL (ref 8.7–10.2)
Chloride: 104 mmol/L (ref 96–106)
Creatinine, Ser: 0.72 mg/dL — ABNORMAL LOW (ref 0.76–1.27)
GFR calc Af Amer: 123 mL/min/1.73
GFR calc non Af Amer: 107 mL/min/1.73
Globulin, Total: 2.4 g/dL (ref 1.5–4.5)
Glucose: 107 mg/dL — ABNORMAL HIGH (ref 65–99)
Potassium: 4 mmol/L (ref 3.5–5.2)
Sodium: 140 mmol/L (ref 134–144)
Total Protein: 6.9 g/dL (ref 6.0–8.5)

## 2019-06-18 ENCOUNTER — Other Ambulatory Visit: Payer: Self-pay

## 2019-06-18 ENCOUNTER — Encounter: Payer: Self-pay | Admitting: Physical Therapy

## 2019-06-18 ENCOUNTER — Ambulatory Visit (INDEPENDENT_AMBULATORY_CARE_PROVIDER_SITE_OTHER): Payer: Self-pay | Admitting: Physical Therapy

## 2019-06-18 ENCOUNTER — Encounter: Payer: Medicaid Other | Admitting: Physical Therapy

## 2019-06-18 DIAGNOSIS — M25512 Pain in left shoulder: Secondary | ICD-10-CM

## 2019-06-18 DIAGNOSIS — M25511 Pain in right shoulder: Secondary | ICD-10-CM

## 2019-06-18 DIAGNOSIS — G8929 Other chronic pain: Secondary | ICD-10-CM

## 2019-06-18 DIAGNOSIS — M6281 Muscle weakness (generalized): Secondary | ICD-10-CM

## 2019-06-18 NOTE — Therapy (Addendum)
[facility] [address] [place], [place], [address] Phone: [phone]   Fax:  [phone]  Physical Therapy Treatment/Discharge addendum PHYSICAL THERAPY DISCHARGE SUMMARY  Visits from Start of Care: 2  Current functional level related to goals / functional outcomes: See below   Remaining deficits: See below  Education / Equipment: HEP Plan: Patient agrees to discharge.  Patient goals were not met. Patient is being discharged due to not returning since the last visit.  ?????  Elsie Ra, PT, DPT 10/14/19 2:24 PM      Patient Details  Name: Scott Tucker MRN: [id number] Date of Birth: 1965-08-29 Referring Provider (PT): Mcarthur Rossetti, MD   Encounter Date: 06/18/2019  PT End of Session - 06/18/19 1342    Visit Number  2    Number of Visits  12    Date for PT Re-Evaluation  07/12/19    PT Start Time  1100    PT Stop Time  1145    PT Time Calculation (min)  45 min    Activity Tolerance  Patient tolerated treatment well    Behavior During Therapy  [facility] for tasks assessed/performed       Past Medical History:  Diagnosis Date  . Chronic back pain   . Coronary artery disease   . Diabetes mellitus without complication [facility])     Past Surgical History:  Procedure Laterality Date  . BACK SURGERY    . SHOULDER SURGERY      There were no vitals filed for this visit.  Subjective Assessment - 06/18/19 1322    Subjective  Rt shoulder is a 10/10 pain he is going to have MRI on it this tuesday. he is also in severe back pain today    Pertinent History  PMH: Rt shoulder scope with DCR 2018, CAD, HTN,CAD,DM    Limitations  Lifting;House hold activities    Diagnostic tests  XR 05/02/19: "3 views of the right shoulder show no acute findings.  There is evidence of a previous distal clavicle resection."    Patient Stated Goals  get the pain down, be able to reach up better    Pain Onset  More than a month ago          [facility] Adult PT Treatment/Exercise - 06/18/19 0001      Exercises   Exercises  Shoulder      Shoulder Exercises: Supine   External Rotation  AAROM;20 reps    Flexion Limitations  attempted AAROM, too painful      Shoulder Exercises: Seated   Retraction  10 reps      Shoulder Exercises: Pulleys   Flexion  2 minutes    Scaption  2 minutes      Shoulder Exercises: ROM/Strengthening   UBE (Upper Arm Bike)  2 min fwd, 2 min retro      Modalities   Modalities  Electrical Stimulation;Vasopneumatic      Electrical Stimulation   Electrical Stimulation Location  Rt shoulder/neck    Electrical Stimulation Action  IFC    Electrical Stimulation Parameters  tolerance in sitting    Electrical Stimulation Goals  Pain      Vasopneumatic   Number Minutes Vasopneumatic   15 minutes    Vasopnuematic Location   Shoulder    Vasopneumatic Pressure  Low    Vasopneumatic Temperature   34      Manual Therapy   Manual therapy comments  gentle Rt shoulder PROM but poor tolerance with this  PT Long Term Goals - 05/31/19 1335      PT LONG TERM GOAL #1   Title  Pt will be I and compliant with HEP. (target for all LTG 6 weeks 07/12/19)    Status  New      PT LONG TERM GOAL #2   Title  Pt will improve shoulder AROM to [facility] (at least 145 deg flexion and abuction)    Status  New      PT LONG TERM GOAL #3   Title  Pt will decrease pain to overall less than 5/10 with ususal activity    Status  New      PT LONG TERM GOAL #4   Title  Pt will improve bilat shoulder strength to 4+/5 to improve function.    Status  New            Plan - 06/18/19 1343    Clinical Impression Statement  He was in a lot of pain and unable to tolerate much exercise or stretching today. Attemped to modify exercises by laying supine to eliminate gravity but this bothered his back too much and still bothered his shoulder. Due to high irritability of symptoms he was then treated with TENS and  vasopuematic to reduce overall pain. He will have MRI on Tuesday.    Personal Factors and Comorbidities  Comorbidity 3+;Past/Current Experience;Time since onset of injury/illness/exacerbation    Comorbidities  PMH: Rt shoulder scope with DCR 2018, CAD, HTN,CAD,DM    Examination-Activity Limitations  Carry;Lift;Dressing;Reach Overhead    Examination-Participation Restrictions  Cleaning;Community Activity;Driving;Laundry;Yard Work    [employer]chant [employer] officer  Evolving/Moderate complexity    Rehab Potential  Fair    PT Frequency  2x / week   1-2   PT Duration  12 weeks    PT Treatment/Interventions  ADLs/Self Care Home Management;Cryotherapy;Electrical Stimulation;Iontophoresis 24m/ml Dexamethasone;Moist Heat;Ultrasound;Neuromuscular re-education;Therapeutic exercise;Therapeutic activities;Manual techniques;Dry needling;Passive range of motion;Taping;Joint Manipulations    PT Next Visit Plan  needs bilat shoulder ROM and gentle strength, there is concern for RTC tear?    PT Home Exercise Plan  Access Code: [email or web address]    Consulted and Agree with Plan of Care  Patient       Patient will benefit from skilled therapeutic intervention in order to improve the following deficits and impairments:  Decreased activity tolerance, Decreased mobility, Decreased range of motion, Decreased strength, Increased muscle spasms, Pain  Visit Diagnosis: Chronic right shoulder pain  Chronic left shoulder pain  Muscle weakness (generalized)     Problem List Patient Active Problem List   Diagnosis Date Noted  . CAD (coronary artery disease) 03/22/2019  . Hypertension 02/23/2019  . Hyperlipidemia 02/23/2019  . Chest pain 02/22/2019    BSilvestre Mesi2/09/2019, 1:51 PM  [facility] [address] [address]Phone: [phone]  Fax:  [phone] Name: Scott JURGENSMRN: [id number] of Birth: [birth year]

## 2019-06-21 ENCOUNTER — Encounter: Payer: Medicaid Other | Admitting: Physical Therapy

## 2019-06-22 ENCOUNTER — Other Ambulatory Visit (INDEPENDENT_AMBULATORY_CARE_PROVIDER_SITE_OTHER): Payer: Self-pay | Admitting: Primary Care

## 2019-06-22 ENCOUNTER — Ambulatory Visit (INDEPENDENT_AMBULATORY_CARE_PROVIDER_SITE_OTHER): Payer: Self-pay | Admitting: Licensed Clinical Social Worker

## 2019-06-22 ENCOUNTER — Ambulatory Visit
Admission: RE | Admit: 2019-06-22 | Discharge: 2019-06-22 | Disposition: A | Discharge status: Home / Self Care | Payer: Self-pay | Source: Ambulatory Visit | Attending: Orthopaedic Surgery | Admitting: Orthopaedic Surgery

## 2019-06-22 ENCOUNTER — Other Ambulatory Visit: Payer: Self-pay

## 2019-06-22 DIAGNOSIS — F411 Generalized anxiety disorder: Secondary | ICD-10-CM

## 2019-06-22 DIAGNOSIS — F331 Major depressive disorder, recurrent, moderate: Secondary | ICD-10-CM

## 2019-06-22 DIAGNOSIS — Z96611 Presence of right artificial shoulder joint: Secondary | ICD-10-CM

## 2019-06-22 MED ORDER — HYDROXYZINE HCL 25 MG PO TABS: 25.0000 mg | ORAL_TABLET | Freq: Every evening | ORAL | 1 refills | Status: DC | PRN

## 2019-06-22 MED FILL — hydrOXYzine HCL 25 MG TABS: 25 | 30 days supply | Qty: 30 | Fill #0

## 2019-06-22 NOTE — BH Specialist Note (Signed)
Integrated Behavioral Health Initial Visit  MRN: [id number] Name: Scott Tucker  Number of Integrated Behavioral Health Clinician visits:: 1/6 Session Start time: 9:15 AM  Session End time: 9:45 AM Total time: 30  Type of Service: Integrated Behavioral Health- Individual/Family Interpretor:No. Interpretor Name and Language: NA   Warm Hand Off Completed.       SUBJECTIVE: Scott Tucker is a 54 y.o. male accompanied by self Patient was referred by NP Randa Evens for depression and anxiety. Patient reports the following symptoms/concerns: Pt reports difficulty managing mental health symptoms triggered by chronic pain and limited mobility. Per patient, there has been no change in back neck pain. Symptoms include difficulty sleeping (2 hrs) and high anxiety  Have a lawyer to disability appeal only denied once Melatonin does not work  Duration of problem: Ongoing; Severity of problem: moderate  OBJECTIVE: Mood: Anxious and Affect: Appropriate Risk of harm to self or others: No plan to harm self or others  LIFE CONTEXT: Family and Social: Receives support from girlfriend School/Work: Pt has Regulatory affairs officer to assist with SSI appeal Self-Care: Pt is compliant with medication management to assist in decreasing/managing of symptoms Life Changes: Pt reports depression and anxiety symptoms triggered by chronic pain  STRENGTHS (Protective Factors/Coping Skills): Pt reports decrease in irritability Pt receives support from girlfriend Pt is open to medication management (states melatonin was not effective) GOALS ADDRESSED: Patient will: 1. Reduce symptoms of: anxiety and depression 2. Increase knowledge and/or ability of: self-management skills  3. Demonstrate ability to: Increase healthy adjustment to current life circumstances and Increase adequate support systems for patient/family  INTERVENTIONS: Interventions utilized: Solution-Focused Strategies, Supportive Counseling and  Psychoeducation and/or Health Education  Standardized Assessments completed: Not Needed  ASSESSMENT: Patient currently experiencing depression and anxiety triggered by chronic pain. Pt reports very limited sleep and high anxiety.   Patient may benefit from continued brief therapy and medication management. Therapeutic strategies discussed to assist in management of symptoms  PLAN: 1. Follow up with behavioral health clinician on : 07/06/19 2. Behavioral recommendations: Utilize strategies discussed and comply with medication management 3. Referral(s): Integrated Behavioral Health Services (In Clinic) 4. "From scale of 1-10, how likely are you to follow plan?":   Bridgett Larsson, LCSW 07/02/19 11:01 AM

## 2019-06-24 ENCOUNTER — Emergency Department (HOSPITAL_COMMUNITY): Payer: Self-pay

## 2019-06-24 ENCOUNTER — Other Ambulatory Visit: Payer: Self-pay

## 2019-06-24 ENCOUNTER — Encounter (HOSPITAL_COMMUNITY): Payer: Self-pay | Admitting: Emergency Medicine

## 2019-06-24 ENCOUNTER — Ambulatory Visit (INDEPENDENT_AMBULATORY_CARE_PROVIDER_SITE_OTHER): Payer: Self-pay

## 2019-06-24 ENCOUNTER — Encounter: Payer: Self-pay | Admitting: Physician Assistant

## 2019-06-24 ENCOUNTER — Emergency Department (HOSPITAL_COMMUNITY)
Admission: EM | Admit: 2019-06-24 | Discharge: 2019-06-24 | Disposition: A | Discharge status: Home / Self Care | Payer: Self-pay | Attending: Emergency Medicine | Admitting: Emergency Medicine

## 2019-06-24 ENCOUNTER — Other Ambulatory Visit: Payer: Self-pay | Admitting: *Deleted

## 2019-06-24 ENCOUNTER — Ambulatory Visit (INDEPENDENT_AMBULATORY_CARE_PROVIDER_SITE_OTHER): Payer: Self-pay | Admitting: Physician Assistant

## 2019-06-24 DIAGNOSIS — M542 Cervicalgia: Secondary | ICD-10-CM

## 2019-06-24 DIAGNOSIS — G8929 Other chronic pain: Secondary | ICD-10-CM

## 2019-06-24 DIAGNOSIS — Z79899 Other long term (current) drug therapy: Secondary | ICD-10-CM | POA: Insufficient documentation

## 2019-06-24 DIAGNOSIS — I259 Chronic ischemic heart disease, unspecified: Secondary | ICD-10-CM | POA: Insufficient documentation

## 2019-06-24 DIAGNOSIS — Z7982 Long term (current) use of aspirin: Secondary | ICD-10-CM | POA: Insufficient documentation

## 2019-06-24 DIAGNOSIS — M25511 Pain in right shoulder: Secondary | ICD-10-CM | POA: Insufficient documentation

## 2019-06-24 DIAGNOSIS — E119 Type 2 diabetes mellitus without complications: Secondary | ICD-10-CM | POA: Insufficient documentation

## 2019-06-24 DIAGNOSIS — I10 Essential (primary) hypertension: Secondary | ICD-10-CM | POA: Insufficient documentation

## 2019-06-24 DIAGNOSIS — Z20822 Contact with and (suspected) exposure to covid-19: Secondary | ICD-10-CM | POA: Insufficient documentation

## 2019-06-24 DIAGNOSIS — J069 Acute upper respiratory infection, unspecified: Secondary | ICD-10-CM | POA: Insufficient documentation

## 2019-06-24 DIAGNOSIS — R0981 Nasal congestion: Secondary | ICD-10-CM | POA: Insufficient documentation

## 2019-06-24 DIAGNOSIS — E785 Hyperlipidemia, unspecified: Secondary | ICD-10-CM

## 2019-06-24 DIAGNOSIS — R519 Headache, unspecified: Secondary | ICD-10-CM | POA: Insufficient documentation

## 2019-06-24 LAB — CBC WITH DIFFERENTIAL/PLATELET
Abs Immature Granulocytes: 0.03 10*3/uL (ref 0.00–0.07)
Basophils Absolute: 0.1 10*3/uL (ref 0.0–0.1)
Basophils Relative: 1 %
Eosinophils Absolute: 0.2 10*3/uL (ref 0.0–0.5)
Eosinophils Relative: 2 %
HCT: 42.7 % (ref 39.0–52.0)
Hemoglobin: 15.1 g/dL (ref 13.0–17.0)
Immature Granulocytes: 0 %
Lymphocytes Relative: 21 %
Lymphs Abs: 2 10*3/uL (ref 0.7–4.0)
MCH: 32.8 pg (ref 26.0–34.0)
MCHC: 35.4 g/dL (ref 30.0–36.0)
MCV: 92.6 fL (ref 80.0–100.0)
Monocytes Absolute: 0.7 10*3/uL (ref 0.1–1.0)
Monocytes Relative: 7 %
Neutro Abs: 6.8 10*3/uL (ref 1.7–7.7)
Neutrophils Relative %: 69 %
Platelets: 337 10*3/uL (ref 150–400)
RBC: 4.61 MIL/uL (ref 4.22–5.81)
RDW: 12.8 % (ref 11.5–15.5)
WBC: 9.8 10*3/uL (ref 4.0–10.5)
nRBC: 0 % (ref 0.0–0.2)

## 2019-06-24 LAB — BASIC METABOLIC PANEL
Anion gap: 9 (ref 5–15)
BUN: 7 mg/dL (ref 6–20)
CO2: 26 mmol/L (ref 22–32)
Calcium: 9.4 mg/dL (ref 8.9–10.3)
Chloride: 106 mmol/L (ref 98–111)
Creatinine, Ser: 0.66 mg/dL (ref 0.61–1.24)
GFR calc Af Amer: >60 mL/min (ref >60)
GFR calc non Af Amer: >60 mL/min (ref >60)
Glucose, Bld: 109 mg/dL — ABNORMAL HIGH (ref 70–99)
Potassium: 3.8 mmol/L (ref 3.5–5.1)
Sodium: 141 mmol/L (ref 135–145)

## 2019-06-24 LAB — LIPID PANEL
Chol/HDL Ratio: 2.6 ratio (ref 0.0–5.0)
Cholesterol, Total: 131 mg/dL (ref 100–199)
HDL: 51 mg/dL (ref >39)
LDL Chol Calc (NIH): 65 mg/dL (ref 0–99)
Triglycerides: 73 mg/dL (ref 0–149)
VLDL Cholesterol Cal: 15 mg/dL (ref 5–40)

## 2019-06-24 LAB — POC SARS CORONAVIRUS 2 AG -  ED: SARS Coronavirus 2 Ag: NEGATIVE

## 2019-06-24 LAB — CBG MONITORING, ED: Glucose-Capillary: 102 mg/dL — ABNORMAL HIGH (ref 70–99)

## 2019-06-24 LAB — SARS CORONAVIRUS 2 (TAT 6-24 HRS): SARS Coronavirus 2: NEGATIVE

## 2019-06-24 MED ORDER — AMOXICILLIN 500 MG PO CAPS: 500.0000 mg | ORAL_CAPSULE | Freq: Three times a day (TID) | ORAL | 0 refills | Status: DC

## 2019-06-24 MED ORDER — CYCLOBENZAPRINE HCL 10 MG PO TABS: 10.0000 mg | ORAL_TABLET | Freq: Three times a day (TID) | ORAL | 0 refills | Status: DC | PRN

## 2019-06-24 MED ORDER — BENZONATATE 100 MG PO CAPS: 100.0000 mg | ORAL_CAPSULE | Freq: Three times a day (TID) | ORAL | 0 refills | Status: DC | PRN

## 2019-06-24 MED FILL — BENZONATATE 100 MG CAPS: 100 | 7 days supply | Qty: 21 | Fill #0

## 2019-06-24 MED FILL — ?FLUOXETINE HCL 20 MG CAPS: 20 | 30 days supply | Qty: 30 | Fill #0

## 2019-06-24 MED FILL — ?AMOXICILLIN 500 MG CAPS: 500 | 7 days supply | Qty: 21 | Fill #0

## 2019-06-24 MED FILL — CYCLOBENZAPRINE 10 MG TAB: 10 | 10 days supply | Qty: 30 | Fill #0

## 2019-06-24 NOTE — ED Triage Notes (Signed)
Patient c/io chest congestion, headache, and body aches x 4 days. Patient also states he has had continued right shoulder pain from torn rotator cuff.

## 2019-06-24 NOTE — Addendum Note (Signed)
Addended by: Mardene Celeste B on: 06/24/2019 04:45 PM   Modules accepted: Orders

## 2019-06-24 NOTE — ED Notes (Signed)
Pt dc'd home w/all belongings, a/o x4, ambulatory on dc  

## 2019-06-24 NOTE — Discharge Instructions (Signed)
Please read the attachments on URIs as well as how to perform a sinus rinse.  I strongly encourage you to perform sinus rinses regularly for your symptoms.  I also encourage you to use a combination of Flonase and Afrin over-the-counter for symptomatic relief of your nasal congestion and sinus discomfort.    I have prescribed you Tessalon Perles which I would like you to take as prescribed for your cough symptoms.  I have also prescribed you amoxicillin which I would like you to hold off on unless symptoms fail to improve with conservative therapy.  I suspect viral etiology.  Please return to the ED or seek immediate medical attention for any new or worsening symptoms.

## 2019-06-24 NOTE — ED Provider Notes (Signed)
[facility] EMERGENCY DEPARTMENT Provider Note   CSN: [id number] Arrival date & time: 06/24/19  0818     History Chief Complaint  Patient presents with  . Generalized Body Aches  . Headache  . Shoulder Pain    Scott Tucker is a 54 y.o. male with PMH of HTN, HLD, and chronic pain who presents to the ED with a 4-day history of chest and sinus congestion, headache, right ear discomfort, generalized body aches, fatigue, and chills.  Patient also endorses continued right shoulder discomfort in addition to his chronic back pain as well as mildly diminished appetite.  He sees orthopedics outpatient for his chronic pain.  He has been taking Zarbee's for his cold and flu symptoms.  He reports he has a history of pneumonia and is concerned about his worsening, nonproductive cough.  He denies any tobacco use, but endorses regular marijuana use.  He denies any fevers, dizziness, chest pain, abdominal pain, nausea or vomiting, urinary symptoms, or changes in bowel habits.  HPI     Past Medical History:  Diagnosis Date  . Chronic back pain   . Coronary artery disease   . Diabetes mellitus without complication [facility])     Patient Active Problem List   Diagnosis Date Noted  . CAD (coronary artery disease) 03/22/2019  . Hypertension 02/23/2019  . Hyperlipidemia 02/23/2019  . Chest pain 02/22/2019    Past Surgical History:  Procedure Laterality Date  . BACK SURGERY    . SHOULDER SURGERY         Family History  Problem Relation Age of Onset  . Heart disease Maternal Uncle     Social History   Tobacco Use  . Smoking status: Never Smoker  . Smokeless tobacco: Never Used  Substance Use Topics  . Alcohol use: Yes    Comment: occasional  . Drug use: Yes    Types: Marijuana    Home Medications Prior to Admission medications   Medication Sig Start Date End Date Taking? Authorizing Provider  amLODipine (NORVASC) 10 MG tablet Take 1 tablet (10 mg total) by  mouth daily. 06/16/19   Grayce Sessions, NP  amoxicillin (AMOXIL) 500 MG capsule Take 1 capsule (500 mg total) by mouth 3 (three) times daily. 06/24/19   Lorelee New, PA-C  aspirin 81 MG EC tablet Take 81 mg by mouth daily.    [provider]  atorvastatin (LIPITOR) 40 MG tablet Take 1 tablet (40 mg total) by mouth daily at 6 PM. 06/16/19   Grayce Sessions, NP  benzonatate (TESSALON) 100 MG capsule Take 1 capsule (100 mg total) by mouth 3 (three) times daily as needed for cough. 06/24/19   Lorelee New, PA-C  FLUoxetine (PROZAC) 20 MG tablet Take 1 tablet (20 mg total) by mouth daily. 06/16/19   Grayce Sessions, NP  hydrOXYzine (ATARAX/VISTARIL) 25 MG tablet Take 1 tablet (25 mg total) by mouth at bedtime as needed for anxiety. 06/22/19   Grayce Sessions, NP  nitroGLYCERIN (NITROSTAT) 0.4 MG SL tablet Place 1 tablet (0.4 mg total) under the tongue every 5 (five) minutes as needed for chest pain. 04/26/19   Grayce Sessions, NP  tiZANidine (ZANAFLEX) 4 MG tablet Take 1 tablet (4 mg total) by mouth every 8 (eight) hours as needed for muscle spasms. 05/12/19   Kathryne Hitch, MD    Allergies    Patient has no known allergies.  Review of Systems   Review of Systems  All other systems reviewed and are negative.   Physical Exam Updated Vital Signs BP (!) 149/91   Pulse 73   Temp 99 F (37.2 C) (Oral)   Resp 18   Ht 5\' 6"  (1.676 m)   Wt 80.7 kg   SpO2 99%   BMI 28.73 kg/m   Physical Exam Vitals and nursing note reviewed. Exam conducted with a chaperone present.  Constitutional:      Appearance: Normal appearance.  HENT:     Head: Normocephalic and atraumatic.     Comments: Headache not exacerbated by lying forward.  No significant sinus TTP.    Right Ear: Tympanic membrane, ear canal and external ear normal. There is no impacted cerumen.     Ears:     Comments: Left ear: Mild external ear TTP, particularly distal and along ET.  Canal is mildly  erythematous.  TM also appears mildly erythematous, however neither bulging nor displaced cone of light.  Mastoid nonerythematous and non-TTP.    Nose: Congestion present.  Eyes:     General: No scleral icterus.    Conjunctiva/sclera: Conjunctivae normal.  Cardiovascular:     Rate and Rhythm: Normal rate and regular rhythm.     Pulses: Normal pulses.     Heart sounds: Normal heart sounds.  Pulmonary:     Effort: Pulmonary effort is normal.  Abdominal:     General: Abdomen is flat. There is no distension.     Palpations: Abdomen is soft.     Tenderness: There is no abdominal tenderness. There is no guarding.  Musculoskeletal:     Cervical back: Normal range of motion and neck supple. No rigidity.  Skin:    General: Skin is dry.     Capillary Refill: Capillary refill takes less than 2 seconds.  Neurological:     Mental Status: He is alert and oriented to person, place, and time.     GCS: GCS eye subscore is 4. GCS verbal subscore is 5. GCS motor subscore is 6.  Psychiatric:        Mood and Affect: Mood normal.        Behavior: Behavior normal.        Thought Content: Thought content normal.     ED Results / Procedures / Treatments   Labs (all labs ordered are listed, but only abnormal results are displayed) Labs Reviewed  BASIC METABOLIC PANEL - Abnormal; Notable for the following components:      Result Value   Glucose, Bld 109 (*)    All other components within normal limits  CBG MONITORING, ED - Abnormal; Notable for the following components:   Glucose-Capillary 102 (*)    All other components within normal limits  CBC WITH DIFFERENTIAL/PLATELET  POC SARS CORONAVIRUS 2 AG -  ED    EKG None  Radiology MR SHOULDER RIGHT WO CONTRAST  Result Date: 06/23/2019 CLINICAL DATA:  Severe right shoulder pain for 6 months. Previous surgery in 2018. EXAM: MRI OF THE RIGHT SHOULDER WITHOUT CONTRAST TECHNIQUE: Multiplanar, multisequence MR imaging of the shoulder was performed. No  intravenous contrast was administered. COMPARISON:  None. FINDINGS: Rotator cuff:  The rotator cuff is intact and normal. Muscles:  No atrophy or edema of the muscles of the rotator cuff. Biceps long head:  Properly located and intact. Acromioclavicular Joint: Previous resection of the distal clavicle. Type 1 acromion. No bursitis. Glenohumeral Joint: Normal. Labrum:  Intact. Bones:  Normal. Other: None IMPRESSION: No significant abnormality of the right shoulder. Previous  resection of the distal clavicle. Electronically Signed   By: Francene Boyers M.D.   On: 06/23/2019 09:57   DG Chest Portable 1 View  Result Date: 06/24/2019 CLINICAL DATA:  Cough and congestion.  Chest pressure EXAM: PORTABLE CHEST 1 VIEW COMPARISON:  February 22, 2019 FINDINGS: The lungs are clear. Heart size and pulmonary vascularity are normal. No adenopathy. No pneumothorax. There is mild degenerative change in the thoracic spine. IMPRESSION: Lungs clear.  Cardiac silhouette within normal limits. Electronically Signed   By: Bretta Bang III M.D.   On: 06/24/2019 09:30    Procedures Procedures (including critical care time)  Medications Ordered in ED Medications - No data to display  ED Course  I have reviewed the triage vital signs and the nursing notes.  Pertinent labs & imaging results that were available during my care of the patient were reviewed by me and considered in my medical decision making (see chart for details).    MDM Rules/Calculators/A&P                      We will obtain rapid COVID-19 antigen testing here in the ED.  His erythematous right ear canal is likely attributable to his Q-tip use.  No significant inflammation concerning for otitis externa.  No ear deformity or significant mastoid TTP concerning for mastoiditis.  Instead, his ET TTP suggests sinus congestion and ET dysfunction.  Will encourage combination of Flonase and Afrin in addition to sinus rinses.   DG chest was negative for acute  pathology.  His lab work was very reassuring.  He is hemodynamically stable and his vital signs are within normal limits.  Will encourage patient to discontinue Zarbee's and instead use Tessalon Perles, as prescribed.  Tylenol or ibuprofen as needed for his generalized body aches and discomfort.  Will obtain send out COVID-19 testing given negative point-of-care test.  Suspect upper respiratory infection, likely viral etiology.  We will also prescribe amoxicillin to take as delayed treatment should his symptoms fail to improve.  Patient plans to follow-up with his orthopedist regarding his chronic right shoulder and back discomfort.  He has regular outpatient support.  Discussed strict ED return precautions with the patient.  All of the evaluation and work-up results were discussed with the patient and any family at bedside. They were provided opportunity to ask any additional questions and have none at this time. They have expressed understanding of verbal discharge instructions as well as return precautions and are agreeable to the plan.   [name] was evaluated in Emergency Department on 06/24/2019 for the symptoms described in the history of present illness. He was evaluated in the context of the global COVID-19 pandemic, which necessitated consideration that the patient might be at risk for infection with the SARS-CoV-2 virus that causes COVID-19. Institutional protocols and algorithms that pertain to the evaluation of patients at risk for COVID-19 are in a state of rapid change based on information released by regulatory bodies including the CDC and federal and state organizations. These policies and algorithms were followed during the patient's care in the ED.   Final Clinical Impression(s) / ED Diagnoses Final diagnoses:  Upper respiratory tract infection, unspecified type    Rx / DC Orders ED Discharge Orders         Ordered    amoxicillin (AMOXIL) 500 MG capsule  3 times daily,    Status:  Discontinued     06/24/19 1109    benzonatate (TESSALON) 100  MG capsule  3 times daily PRN,   Status:  Discontinued     06/24/19 1109    amoxicillin (AMOXIL) 500 MG capsule  3 times daily     06/24/19 1112    benzonatate (TESSALON) 100 MG capsule  3 times daily PRN     06/24/19 1112           Elvera Maria 06/24/19 1112    Tilden Fossa, MD 06/26/19 1021

## 2019-06-24 NOTE — Progress Notes (Signed)
Office Visit Note   Patient: Scott Tucker           Date of Birth: 1966-04-21           MRN: [id number] Visit Date: 06/24/2019              Requested by: Kerin Perna, NP [id number] [place],  [place] [address] PCP: Kerin Perna, NP   Assessment & Plan: Visit Diagnoses:  1. Neck pain   2. Chronic right shoulder pain     Plan: Due to his radicular symptoms down the right arm and clinical findings recommend MRI of the cervical spine to rule out HNP as source of his radicular symptoms down the right arm.  Having follow-up after the MRI to go over results discuss further treatment.  Follow-Up Instructions: Return After MRI.   Orders:  Orders Placed This Encounter  Procedures  . XR Cervical Spine 2 or 3 views   Meds ordered this encounter  Medications  . cyclobenzaprine (FLEXERIL) 10 MG tablet    Sig: Take 1 tablet (10 mg total) by mouth 3 (three) times daily as needed for muscle spasms.    Dispense:  30 tablet    Refill:  0      Procedures: No procedures performed   Clinical Data: No additional findings.   Subjective: Chief Complaint  Patient presents with  . Right Shoulder - Follow-up    HPI Scott Tucker returns today to go over the MRI of his right shoulder.  States since the MRI is now having radicular symptoms down the right arm.  Pain starts up lateral aspect of the cervical, radiates down into the hand.  He notes that the palm of the hand and fingers think right hand goes numb.  He has had no new injury.  He notes decreased range of motion of the cervical spine. Review of Systems Negative for fevers chills shortness of breath chest pain  Objective: Vital Signs: There were no vitals taken for this visit.  Physical Exam Constitutional:      Appearance: He is not ill-appearing, toxic-appearing or diaphoretic.  Pulmonary:     Effort: Pulmonary effort is normal.  Neurological:     Mental Status: He is alert and oriented to person,  place, and time.  Psychiatric:        Mood and Affect: Mood normal.     Ortho Exam Cervical spine decreased range of motion with flexion and rotation left and right.  Positive Spurling's.  He has tenderness over the right medial scapular border.  Weakness with external and internal rotation against resistance right shoulder.  Otherwise 5 out of 5 strength bilateral upper extremities.  Subjective decreased sensation throughout the fingertips of the right hand full sensation left hand to light touch.He does does have a a lot of guarding particularly around the right shoulder girdle with examination is difficult to get a true exam on him due to discomfort with any motion of the shoulder.  Specialty Comments:  No specialty comments available.  Imaging: DG Chest Portable 1 View  Result Date: 06/24/2019 CLINICAL DATA:  Cough and congestion.  Chest pressure EXAM: PORTABLE CHEST 1 VIEW COMPARISON:  February 22, 2019 FINDINGS: The lungs are clear. Heart size and pulmonary vascularity are normal. No adenopathy. No pneumothorax. There is mild degenerative change in the thoracic spine. IMPRESSION: Lungs clear.  Cardiac silhouette within normal limits. Electronically Signed   By: Lowella Grip III M.D.   On: 06/24/2019 09:30  XR Cervical Spine 2 or 3 views  Result Date: 06/24/2019 2 views cervical spine: No acute fractures.  The space overall well-maintained.  No spondylolisthesis.  Loss of lordotic curvature.  Lower cervical spine with anterior endplate spurring off multiple cervical vertebral bodies.    PMFS History: Patient Active Problem List   Diagnosis Date Noted  . CAD (coronary artery disease) 03/22/2019  . Hypertension 02/23/2019  . Hyperlipidemia 02/23/2019  . Chest pain 02/22/2019   Past Medical History:  Diagnosis Date  . Chronic back pain   . Coronary artery disease   . Diabetes mellitus without complication (HCC)     Family History  Problem Relation Age of Onset  . Heart  disease Maternal Uncle     Past Surgical History:  Procedure Laterality Date  . BACK SURGERY    . SHOULDER SURGERY     Social History   Occupational History  . Not on file  Tobacco Use  . Smoking status: Never Smoker  . Smokeless tobacco: Never Used  Substance and Sexual Activity  . Alcohol use: Yes    Comment: occasional  . Drug use: Yes    Types: Marijuana  . Sexual activity: Not on file

## 2019-06-25 ENCOUNTER — Encounter: Payer: Medicaid Other | Admitting: Physical Therapy

## 2019-06-28 ENCOUNTER — Encounter: Payer: Medicaid Other | Admitting: Physical Therapy

## 2019-07-02 ENCOUNTER — Encounter: Payer: Medicaid Other | Admitting: Physical Therapy

## 2019-07-04 ENCOUNTER — Ambulatory Visit (HOSPITAL_COMMUNITY)
Admission: RE | Admit: 2019-07-04 | Discharge: 2019-07-04 | Disposition: A | Discharge status: Home / Self Care | Payer: Medicaid Other | Source: Ambulatory Visit | Attending: Physician Assistant | Admitting: Physician Assistant

## 2019-07-04 ENCOUNTER — Other Ambulatory Visit: Payer: Self-pay

## 2019-07-04 DIAGNOSIS — G8929 Other chronic pain: Secondary | ICD-10-CM | POA: Insufficient documentation

## 2019-07-04 DIAGNOSIS — M542 Cervicalgia: Secondary | ICD-10-CM

## 2019-07-04 DIAGNOSIS — M25511 Pain in right shoulder: Secondary | ICD-10-CM | POA: Insufficient documentation

## 2019-07-05 ENCOUNTER — Encounter: Payer: Medicaid Other | Admitting: Physical Therapy

## 2019-07-06 ENCOUNTER — Other Ambulatory Visit: Payer: Self-pay

## 2019-07-06 ENCOUNTER — Telehealth: Payer: Self-pay

## 2019-07-06 ENCOUNTER — Ambulatory Visit (INDEPENDENT_AMBULATORY_CARE_PROVIDER_SITE_OTHER): Payer: Self-pay | Admitting: Licensed Clinical Social Worker

## 2019-07-06 DIAGNOSIS — F331 Major depressive disorder, recurrent, moderate: Secondary | ICD-10-CM

## 2019-07-06 DIAGNOSIS — F419 Anxiety disorder, unspecified: Secondary | ICD-10-CM

## 2019-07-06 DIAGNOSIS — G8929 Other chronic pain: Secondary | ICD-10-CM

## 2019-07-06 DIAGNOSIS — M542 Cervicalgia: Secondary | ICD-10-CM

## 2019-07-06 DIAGNOSIS — F411 Generalized anxiety disorder: Secondary | ICD-10-CM

## 2019-07-06 DIAGNOSIS — R454 Irritability and anger: Secondary | ICD-10-CM

## 2019-07-06 DIAGNOSIS — F329 Major depressive disorder, single episode, unspecified: Secondary | ICD-10-CM

## 2019-07-06 NOTE — Telephone Encounter (Signed)
Noted referral sent to [facility] to Dr. Wynetta Emery thru [facility]

## 2019-07-06 NOTE — Telephone Encounter (Signed)
I sent an urgent order for patient to be referred to Dr. Wynetta Emery ASAP please

## 2019-07-07 ENCOUNTER — Ambulatory Visit: Payer: Medicaid Other | Admitting: Physician Assistant

## 2019-07-09 ENCOUNTER — Encounter: Payer: Medicaid Other | Admitting: Physical Therapy

## 2019-07-14 ENCOUNTER — Ambulatory Visit (INDEPENDENT_AMBULATORY_CARE_PROVIDER_SITE_OTHER): Payer: Self-pay | Admitting: Primary Care

## 2019-07-14 ENCOUNTER — Other Ambulatory Visit: Payer: Self-pay

## 2019-07-14 DIAGNOSIS — M549 Dorsalgia, unspecified: Secondary | ICD-10-CM

## 2019-07-14 DIAGNOSIS — G894 Chronic pain syndrome: Secondary | ICD-10-CM

## 2019-07-14 DIAGNOSIS — I10 Essential (primary) hypertension: Secondary | ICD-10-CM

## 2019-07-14 DIAGNOSIS — M542 Cervicalgia: Secondary | ICD-10-CM

## 2019-07-14 DIAGNOSIS — G8929 Other chronic pain: Secondary | ICD-10-CM

## 2019-07-14 NOTE — Progress Notes (Signed)
Pt has not taken medication or checked Bp this morning Pt complains of back pain  Pt complains of tingling down arms on both sides- more so on the right. Has discussed this with a provider. Has been suggested to do surgery because something is pushing against nerves.

## 2019-07-14 NOTE — Progress Notes (Signed)
Virtual Visit via Telephone Note  I connected with [name] on 07/14/19 at  9:10 AM EST by telephone and verified that I am speaking with the correct person using two identifiers.   I discussed the limitations, risks, security and privacy concerns of performing an evaluation and management service by telephone and the availability of in person appointments. I also discussed with the patient that there may be a patient responsible charge related to this service. The patient expressed understanding and agreed to proceed.   History of Present Illness: Scott Tucker is having a tele visit for blood pressure follow up. Patient just woke up has not taken medications or taken his blood pressure. Admits to not checking his blood pressure he complains of hurting so bad some days he is not able to get out of the bed to even eat. Trying to find a comfortable place.  He does have neck and back pain may be requiring surgery followed by orthopedics.  Past Medical History:  Diagnosis Date  . Chronic back pain   . Coronary artery disease   . Diabetes mellitus without complication [facility])    Current Outpatient Medications on File Prior to Visit  Medication Sig Dispense Refill  . amLODipine (NORVASC) 10 MG tablet Take 1 tablet (10 mg total) by mouth daily. 90 tablet 1  . aspirin 81 MG EC tablet Take 81 mg by mouth daily.    Marland Kitchen atorvastatin (LIPITOR) 40 MG tablet Take 1 tablet (40 mg total) by mouth daily at 6 PM. 90 tablet 1  . cyclobenzaprine (FLEXERIL) 10 MG tablet Take 1 tablet (10 mg total) by mouth 3 (three) times daily as needed for muscle spasms. 30 tablet 0  . FLUoxetine (PROZAC) 20 MG tablet Take 1 tablet (20 mg total) by mouth daily. 90 tablet 1  . hydrOXYzine (ATARAX/VISTARIL) 25 MG tablet Take 1 tablet (25 mg total) by mouth at bedtime as needed for anxiety. 30 tablet 1  . tiZANidine (ZANAFLEX) 4 MG tablet Take 1 tablet (4 mg total) by mouth every 8 (eight) hours as needed for muscle spasms.  40 tablet 1  . amoxicillin (AMOXIL) 500 MG capsule Take 1 capsule (500 mg total) by mouth 3 (three) times daily. (Patient not taking: Reported on 07/14/2019) 21 capsule 0  . nitroGLYCERIN (NITROSTAT) 0.4 MG SL tablet Place 1 tablet (0.4 mg total) under the tongue every 5 (five) minutes as needed for chest pain. 75 tablet 1   No current facility-administered medications on file prior to visit.   Observations/Objective: Review of Systems  Musculoskeletal: Positive for back pain.  Neurological: Positive for headaches.       From neck pain  All other systems reviewed and are negative.   Assessment and Plan: Sencere was seen today for blood pressure check.  Diagnoses and all orders for this visit:  Essential hypertension Denies shortness of breath, chest pain or lower extremity edema. No problems with taking blood pressure medication not monitoring on a regular bases unable to determine if Bp is at goal or managed. Recommended to take medication as prescribed , eat low sodium healthy diet.   Chronic pain syndrome Neck and back pain followed by ortho discussed results from MRI suggesting may need to have surgery.   Chronic neck and back pain MRI performed by ortho-Reversal of cervical lordosis Discogenic endplate edema at E9-3. No fracture, discitis, or aggressive lesion., C3-4: Disc bulging with right paracentral inferiorly migrating extrusion flattening the cord. Right uncovertebral ridging and foraminal impingement.  C4-5: Disc narrowing and bulging with endplate ridging and a right paracentral protrusion compressing the right cord. Disc narrowing and uncovertebral spurring causes biforaminal impingement.  C5-6: Disc narrowing and bulging with ridging of the endplates and uncovertebral joints on the left more than right. Left more than right foraminal impingement that is high-grade  C6-7: Disc narrowing with uncovertebral ridging and high-grade left foraminal  impingement.  C7-T1:Unremarkable Requesting referral to pain clinic this can be done but explained he will have to pay out of pocket for visits.   Follow Up Instructions:    I discussed the assessment and treatment plan with the patient. The patient was provided an opportunity to ask questions and all were answered. The patient agreed with the plan and demonstrated an understanding of the instructions.   The patient was advised to call back or seek an in-person evaluation if the symptoms worsen or if the condition fails to improve as anticipated.  I provided 12 minutes of non-face-to-face time during this encounter.   Grayce Sessions, NP

## 2019-07-18 NOTE — Progress Notes (Signed)
Cardiology Office Note   Date:  07/19/2019   ID:  Tucker, Scott 29-Sep-1965, MRN [id number]  PCP:  Kerin Perna, NP    No chief complaint on file.  CAD  Wt Readings from Last 3 Encounters:  07/19/19 179 lb 12.8 oz (81.6 kg)  06/24/19 178 lb (80.7 kg)  06/16/19 179 lb (81.2 kg)       History of Present Illness: Scott Tucker is a 54 y.o. male with CAD.  He has risk factors for CAD including hypertension, hyperlipidemia and diabetes.  In [place], it appears he had an angioplasty in approximately 2010.  He was admitted to [facility] in October 2020.  He had some ECG changes including T wave inversions in V5 and V6.  He had a coronary CTA showing obstructive CAD and a small OM vessel.  Positive FFR in that vessel.  Medical management done given the size of the vessel.  Troponins negative at that time.  Since the last visit, he has felt well.  No chest pains with exertion.  Denies : Chest pain. Dizziness. Leg edema. Nitroglycerin use. Orthopnea. Palpitations. Paroxysmal nocturnal dyspnea. Shortness of breath. Syncope.   Occasional DOE- rare.   Intolerant of ACE-I in the past due to cough.  He walks 3x/week.  Low back pain limits walking.  Uses cane.  Had prior back surgery.  He smokes MJ, no cigarettes.  He uses for back pain.  Uses CBD oil also.   BP at home is in the 150/80 range at home.     Past Medical History:  Diagnosis Date  . Chronic back pain   . Coronary artery disease   . Diabetes mellitus without complication [facility])     Past Surgical History:  Procedure Laterality Date  . BACK SURGERY    . SHOULDER SURGERY       Current Outpatient Medications  Medication Sig Dispense Refill  . amLODipine (NORVASC) 10 MG tablet Take 1 tablet (10 mg total) by mouth daily. 90 tablet 1  . aspirin 81 MG EC tablet Take 81 mg by mouth daily.    Marland Kitchen atorvastatin (LIPITOR) 40 MG tablet Take 1 tablet (40 mg total) by mouth daily at 6 PM. 90 tablet 1  .  cyclobenzaprine (FLEXERIL) 10 MG tablet Take 1 tablet (10 mg total) by mouth 3 (three) times daily as needed for muscle spasms. 30 tablet 0  . FLUoxetine (PROZAC) 20 MG tablet Take 1 tablet (20 mg total) by mouth daily. 90 tablet 1  . hydrOXYzine (ATARAX/VISTARIL) 25 MG tablet Take 1 tablet (25 mg total) by mouth at bedtime as needed for anxiety. 30 tablet 1  . nitroGLYCERIN (NITROSTAT) 0.4 MG SL tablet Place 1 tablet (0.4 mg total) under the tongue every 5 (five) minutes as needed for chest pain. 75 tablet 1  . tiZANidine (ZANAFLEX) 4 MG tablet Take 1 tablet (4 mg total) by mouth every 8 (eight) hours as needed for muscle spasms. 40 tablet 1   No current facility-administered medications for this visit.    Allergies:   Patient has no known allergies.    Social History:  The patient  reports that he has never smoked. He has never used smokeless tobacco. He reports current alcohol use. He reports current drug use. Drug: Marijuana.   Family History:  The patient's family history includes Heart disease in his maternal uncle.    ROS:  Please see the history of present illness.   Otherwise, review of systems are  positive for back pain.   All other systems are reviewed and negative.    PHYSICAL EXAM: VS:  BP (!) 144/80   Pulse (!) 102   Ht 5\' 6"  (1.676 m)   Wt 179 lb 12.8 oz (81.6 kg)   SpO2 97%   BMI 29.02 kg/m  , BMI Body mass index is 29.02 kg/m. GEN: Well nourished, well developed, in no acute distress  HEENT: normal  Neck: no JVD, carotid bruits, or masses Cardiac: RRR; no murmurs, rubs, or gallops,no edema  Respiratory:  clear to auscultation bilaterally, normal work of breathing GI: soft, nontender, nondistended, + BS MS: no deformity or atrophy  Skin: warm and dry, no rash Neuro:  Strength and sensation are intact Psych: euthymic mood, full affect   EKG:   The ekg ordered 02/2019 demonstrates NSR, inferior and lateral ST depression, T wave inversion   Recent  Labs: 06/16/2019: ALT 14 06/24/2019: BUN 7; Creatinine, Ser 0.66; Hemoglobin 15.1; Platelets 337; Potassium 3.8; Sodium 141   Lipid Panel    Component Value Date/Time   CHOL 131 06/24/2019 0807   TRIG 73 06/24/2019 0807   HDL 51 06/24/2019 0807   CHOLHDL 2.6 06/24/2019 0807   CHOLHDL 3.7 02/23/2019 0405   VLDL 11 02/23/2019 0405   LDLCALC 65 06/24/2019 0807     Other studies Reviewed: Additional studies/ records that were reviewed today with results demonstrating: labs reviewed from PMD.   ASSESSMENT AND PLAN:  1. CAD: Continue aggressive medical therapy.  If symptoms persist despite maximal medical therapy, would plan for cardiac catheterization. 2. Hypertension: Add losartan 25 mg daily. Given increased BP and DM, ARB is a goof choice.  Intolerant of ACE-I due to cough. 3. Hyperlipidemia: TC 131, HDL 51 in 2/21. Continue atorvastatin.  4. Type 2 diabetes: 4.9 A1C 5. Preoperative cardiovascular eval.: Back surgery planned in the next few weeks. Blood work to be checked preop, can see what Cr and K are at that time. No further cardiac testing needed before surgery.   Current medicines are reviewed at length with the patient today.  The patient concerns regarding his medicines were addressed.  The following changes have been made:  No change  Labs/ tests ordered today include:  No orders of the defined types were placed in this encounter.   Recommend 150 minutes/week of aerobic exercise Low fat, low carb, high fiber diet recommended  Disposition:   FU in 6 months- hopefully back pain will be better at that time   Signed, 3/21, MD  07/19/2019 11:37 AM    [facility] [address] [place], [place], [place]  [place] Phone: [phone]; Fax: [phone]

## 2019-07-19 ENCOUNTER — Other Ambulatory Visit: Payer: Self-pay

## 2019-07-19 ENCOUNTER — Encounter: Payer: Self-pay | Admitting: Interventional Cardiology

## 2019-07-19 ENCOUNTER — Ambulatory Visit (INDEPENDENT_AMBULATORY_CARE_PROVIDER_SITE_OTHER): Payer: Self-pay | Admitting: Interventional Cardiology

## 2019-07-19 ENCOUNTER — Other Ambulatory Visit: Payer: Self-pay | Admitting: Interventional Cardiology

## 2019-07-19 VITALS — BP 144/80 | HR 102 | Ht 66.0 in | Wt 179.8 lb

## 2019-07-19 DIAGNOSIS — I2510 Atherosclerotic heart disease of native coronary artery without angina pectoris: Secondary | ICD-10-CM

## 2019-07-19 DIAGNOSIS — I10 Essential (primary) hypertension: Secondary | ICD-10-CM

## 2019-07-19 DIAGNOSIS — E119 Type 2 diabetes mellitus without complications: Secondary | ICD-10-CM

## 2019-07-19 DIAGNOSIS — E785 Hyperlipidemia, unspecified: Secondary | ICD-10-CM

## 2019-07-19 MED ORDER — LOSARTAN POTASSIUM 25 MG PO TABS: 25.0000 mg | ORAL_TABLET | Freq: Every day | ORAL | 3 refills | Status: DC

## 2019-07-19 MED FILL — ?LOSARTAN POTASSIUM 25MG: 25 | 30 days supply | Qty: 30 | Fill #0

## 2019-07-19 NOTE — Patient Instructions (Signed)
Medication Instructions:  Your physician has recommended you make the following change in your medication:   START: losartan 25 mg tablet: Take 1 tablet by mouth once a day  *If you need a refill on your cardiac medications before your next appointment, please call your pharmacy*   Lab Work: None ordered  If you have labs (blood work) drawn today and your tests are completely normal, you will receive your results only by: Marland Kitchen MyChart Message (if you have MyChart) OR . A paper copy in the mail If you have any lab test that is abnormal or we need to change your treatment, we will call you to review the results.   Testing/Procedures: None ordered   Follow-Up: At [facility], you and your health needs are our priority.  As part of our continuing mission to provide you with exceptional heart care, we have created designated Provider Care Teams.  These Care Teams include your primary Cardiologist (physician) and Advanced Practice Providers (APPs -  Physician Assistants and Nurse Practitioners) who all work together to provide you with the care you need, when you need it.  We recommend signing up for the patient portal called "MyChart".  Sign up information is provided on this After Visit Summary.  MyChart is used to connect with patients for Virtual Visits (Telemedicine).  Patients are able to view lab/test results, encounter notes, upcoming appointments, etc.  Non-urgent messages can be sent to your provider as well.   To learn more about what you can do with MyChart, go to [email or web address].    Your next appointment:   6 month(s)  The format for your next appointment:   Either In Person or Virtual  Provider:   You may see Lance Muss, MD or one of the following Advanced Practice Providers on your designated Care Team:    Ronie Spies, PA-C  Jacolyn Reedy, PA-C    Other Instructions

## 2019-07-20 MED FILL — ?ATORVASTATIN 40MG TABL: 40 | 30 days supply | Qty: 30 | Fill #1

## 2019-07-26 NOTE — BH Specialist Note (Signed)
Integrated Behavioral Health Visit via Telemedicine (Telephone)  07/06/2019 Scott Tucker [id number]   Session Start time: 9:25 AM  Session End time: 9:45 AM Total time: 20  Referring Provider: NP Randa Evens Type of Visit: Telephonic Patient location: [facility] Provider location: Office All persons participating in visit: LCSW and patient  Confirmed patient's address: Yes  Confirmed patient's phone number: Yes  Any changes to demographics: No   Confirmed patient's insurance: Yes  Any changes to patient's insurance: No   Discussed confidentiality: Yes    The following statements were read to the patient and/or legal guardian that are established with the [facility] Provider.  "The purpose of this phone visit is to provide behavioral health care while limiting exposure to the coronavirus (COVID19).  There is a possibility of technology failure and discussed alternative modes of communication if that failure occurs."  "By engaging in this telephone visit, you consent to the provision of healthcare.  Additionally, you authorize for your insurance to be billed for the services provided during this telephone visit."   Patient and/or legal guardian consented to telephone visit: Yes   PRESENTING CONCERNS: Patient and/or family reports the following symptoms/concerns: Pt reports difficulty managing mental health symptoms triggered by chronic pain and limited mobility. Pt shared continued difficulty obtaining sleep due to ineffectiveness of muscle relaxers Duration of problem: Ongoing; Severity of problem: moderate  STRENGTHS (Protective Factors/Coping Skills): Pt reports decrease in irritability Pt receives support from girlfriend Pt is open to medication management (states melatonin was not effective)  GOALS ADDRESSED: Patient will: 1.  Reduce symptoms of: agitation, anxiety and depression  2.  Increase knowledge and/or ability of: self-management skills  3.  Demonstrate  ability to: Increase healthy adjustment to current life circumstances, Increase adequate support systems for patient/family and Increase motivation to adhere to plan of care  INTERVENTIONS: Interventions utilized:  Solution-Focused Strategies and Supportive Counseling Standardized Assessments completed: Not Needed  ASSESSMENT: Patient currently experiencing depression and anxiety triggered by chronic pain. Pt reports very limited sleep and high anxiety. States muscle relaxer medication is ineffective in assisting with pain.   Patient may benefit from continued brief therapy and medication management. Therapeutic strategies discussed to assist in management of irritability. Pt has submitted SCAT application to [facility] for completion. Pt agreed to follow up on application status on 07/07/19.  PLAN: 1. Follow up with behavioral health clinician on : Schedule follow up appointment with LCSW 2. Behavioral recommendations: Comply with medication management and utilize strategies discussed in session 3. Referral(s): Integrated [employer] (In Clinic)  Bridgett Larsson, [place] 07/26/19 10:19 AM

## 2019-07-27 ENCOUNTER — Ambulatory Visit (INDEPENDENT_AMBULATORY_CARE_PROVIDER_SITE_OTHER): Payer: Medicaid Other | Admitting: Licensed Clinical Social Worker

## 2019-08-03 ENCOUNTER — Other Ambulatory Visit: Payer: Self-pay | Admitting: Physician Assistant

## 2019-08-03 MED FILL — hydrOXYzine HCL 25 MG TABS: 25 | 30 days supply | Qty: 30 | Fill #1

## 2019-08-03 MED FILL — AMLODIPINE BESYLATE 10 MG T: 10 | 30 days supply | Qty: 30 | Fill #1

## 2019-08-03 MED FILL — ?ATORVASTATIN 40MG TABLET: 40 | 30 days supply | Qty: 30 | Fill #1

## 2019-08-03 MED FILL — ?FLUOXETINE HCL 20 MG CAPS: 20 | 30 days supply | Qty: 30 | Fill #1

## 2019-08-03 MED FILL — CYCLOBENZAPRINE 10 MG TAB: 10 | 10 days supply | Qty: 30 | Fill #0

## 2019-08-03 NOTE — Telephone Encounter (Signed)
S/w patient notified Rx has been sent.

## 2019-10-01 ENCOUNTER — Other Ambulatory Visit (INDEPENDENT_AMBULATORY_CARE_PROVIDER_SITE_OTHER): Payer: Self-pay | Admitting: Primary Care

## 2019-10-01 MED FILL — ?ATORVASTATIN 40MG TABLET: 40 | 30 days supply | Qty: 30 | Fill #2

## 2019-10-01 MED FILL — FLUoxetine HCL 20 MG CAPS: 20 | 30 days supply | Qty: 30 | Fill #2

## 2019-10-01 MED FILL — ?AMLODIPINE BESYL 10MG TABL: 10 | 30 days supply | Qty: 30 | Fill #2

## 2019-10-01 NOTE — Telephone Encounter (Signed)
Sent to PCP ?

## 2019-10-06 MED FILL — hydrOXYzine HCL 25 MG TABS: 25 | 30 days supply | Qty: 30 | Fill #0

## 2019-11-05 ENCOUNTER — Other Ambulatory Visit: Payer: Self-pay | Admitting: Physician Assistant

## 2019-11-05 MED FILL — ?ATORVASTATIN 40MG TABLET: 40 | 30 days supply | Qty: 30 | Fill #3

## 2019-11-05 MED FILL — CYCLOBENZAPRINE 10 MG TAB: 10 | 10 days supply | Qty: 30 | Fill #0

## 2019-11-05 MED FILL — ?AMLODIPINE BESYL 10MG TABL: 10 | 30 days supply | Qty: 30 | Fill #3

## 2019-11-05 MED FILL — FLUoxetine HCL 20 MG CAPS: 20 | 30 days supply | Qty: 30 | Fill #3

## 2019-11-05 MED FILL — LOSARTAN POTASSIUM 25 MG TA: 25 | 30 days supply | Qty: 30 | Fill #0

## 2019-11-05 MED FILL — hydrOXYzine HCL 25 MG TABS: 25 | 30 days supply | Qty: 30 | Fill #1

## 2019-11-22 ENCOUNTER — Other Ambulatory Visit: Payer: Self-pay

## 2019-11-22 ENCOUNTER — Ambulatory Visit: Payer: Medicaid Other

## 2019-12-21 ENCOUNTER — Ambulatory Visit (INDEPENDENT_AMBULATORY_CARE_PROVIDER_SITE_OTHER): Payer: Medicaid Other | Admitting: Primary Care

## 2019-12-22 ENCOUNTER — Other Ambulatory Visit: Payer: Self-pay

## 2019-12-22 ENCOUNTER — Ambulatory Visit (INDEPENDENT_AMBULATORY_CARE_PROVIDER_SITE_OTHER): Payer: Self-pay | Admitting: Primary Care

## 2019-12-22 ENCOUNTER — Other Ambulatory Visit (INDEPENDENT_AMBULATORY_CARE_PROVIDER_SITE_OTHER): Payer: Self-pay | Admitting: Primary Care

## 2019-12-22 ENCOUNTER — Encounter (INDEPENDENT_AMBULATORY_CARE_PROVIDER_SITE_OTHER): Payer: Self-pay | Admitting: Primary Care

## 2019-12-22 VITALS — BP 131/82 | HR 76 | Temp 98.1°F | Resp 16 | Ht 66.0 in | Wt 192.0 lb

## 2019-12-22 DIAGNOSIS — Z1159 Encounter for screening for other viral diseases: Secondary | ICD-10-CM

## 2019-12-22 DIAGNOSIS — Z125 Encounter for screening for malignant neoplasm of prostate: Secondary | ICD-10-CM | POA: Diagnosis not present

## 2019-12-22 DIAGNOSIS — I10 Essential (primary) hypertension: Secondary | ICD-10-CM

## 2019-12-22 DIAGNOSIS — I2510 Atherosclerotic heart disease of native coronary artery without angina pectoris: Secondary | ICD-10-CM

## 2019-12-22 DIAGNOSIS — E785 Hyperlipidemia, unspecified: Secondary | ICD-10-CM

## 2019-12-22 DIAGNOSIS — Z1211 Encounter for screening for malignant neoplasm of colon: Secondary | ICD-10-CM

## 2019-12-22 MED ORDER — AMLODIPINE BESYLATE 10 MG PO TABS: 10.0000 mg | ORAL_TABLET | Freq: Every day | ORAL | 1 refills | Status: DC

## 2019-12-22 MED FILL — AMLODIPINE BESYLATE 10 MG T: 10 | 30 days supply | Qty: 30 | Fill #0

## 2019-12-22 NOTE — Progress Notes (Signed)
Established Patient Office Visit  Subjective:  Patient ID: Scott Tucker, male    DOB: Mar 26, 1966  Age: 54 y.o. MRN: [id number]  CC: No chief complaint on file.   HPI [name] is a 54 year old obese male who presents for blood pressure follow-up, management type 2 diabetes.  Reviewed chart followed by cardiologist Jettie Booze, MD managing coronary artery disease and hypertension.  Previous visit discontinued lisinopril and replace with losartan 25 mg daily.  Past Medical History:  Diagnosis Date  . Chronic back pain   . Coronary artery disease   . Diabetes mellitus without complication [facility])     Past Surgical History:  Procedure Laterality Date  . BACK SURGERY    . SHOULDER SURGERY      Family History  Problem Relation Age of Onset  . Heart disease Maternal Uncle     Social History   Socioeconomic History  . Marital status: Single    Spouse name: Not on file  . Number of children: Not on file  . Years of education: Not on file  . Highest education level: Not on file  Occupational History  . Not on file  Tobacco Use  . Smoking status: Never Smoker  . Smokeless tobacco: Never Used  Substance and Sexual Activity  . Alcohol use: Yes    Comment: occasional  . Drug use: Yes    Types: Marijuana  . Sexual activity: Not on file  Other Topics Concern  . Not on file  Social History Narrative  . Not on file   Social Determinants of Health   Financial Resource Strain:   . Difficulty of Paying Living Expenses:   Food Insecurity:   . Worried About Charity fundraiser in the Last Year:   . Arboriculturist in the Last Year:   Transportation Needs:   . Film/video editor (Medical):   Marland Kitchen Lack of Transportation (Non-Medical):   Physical Activity:   . Days of Exercise per Week:   . Minutes of Exercise per Session:   Stress:   . Feeling of Stress :   Social Connections:   . Frequency of Communication with Friends and Family:   . Frequency of  Social Gatherings with Friends and Family:   . Attends Religious Services:   . Active Member of Clubs or Organizations:   . Attends Archivist Meetings:   Marland Kitchen Marital Status:   Intimate Partner Violence:   . Fear of Current or Ex-Partner:   . Emotionally Abused:   Marland Kitchen Physically Abused:   . Sexually Abused:     Outpatient Medications Prior to Visit  Medication Sig Dispense Refill  . aspirin 81 MG EC tablet Take 81 mg by mouth daily.    Marland Kitchen atorvastatin (LIPITOR) 40 MG tablet Take 1 tablet (40 mg total) by mouth daily at 6 PM. 90 tablet 1  . cyclobenzaprine (FLEXERIL) 10 MG tablet TAKE 1 TABLET (10 MG TOTAL) BY MOUTH 3 (THREE) TIMES DAILY AS NEEDED FOR MUSCLE SPASMS. 30 tablet 0  . FLUoxetine (PROZAC) 20 MG tablet Take 1 tablet (20 mg total) by mouth daily. 90 tablet 1  . hydrOXYzine (ATARAX/VISTARIL) 25 MG tablet TAKE 1 TABLET (25 MG TOTAL) BY MOUTH AT BEDTIME AS NEEDED FOR ANXIETY. 30 tablet 1  . losartan (COZAAR) 25 MG tablet Take 1 tablet (25 mg total) by mouth daily. 90 tablet 3  . nitroGLYCERIN (NITROSTAT) 0.4 MG SL tablet Place 1 tablet (0.4 mg total) under  the tongue every 5 (five) minutes as needed for chest pain. 75 tablet 1  . tiZANidine (ZANAFLEX) 4 MG tablet Take 1 tablet (4 mg total) by mouth every 8 (eight) hours as needed for muscle spasms. 40 tablet 1  . amLODipine (NORVASC) 10 MG tablet Take 1 tablet (10 mg total) by mouth daily. 90 tablet 1   No facility-administered medications prior to visit.    No Known Allergies  ROS Review of Systems  Musculoskeletal: Positive for arthralgias.       All over - was told needs surgery pain located in neck shoulders upper, lower back and legs  All other systems reviewed and are negative.     Objective:    Physical Exam Vitals reviewed.  Constitutional:      Appearance: He is obese.  HENT:     Head: Normocephalic.     Right Ear: Tympanic membrane normal.     Left Ear: Tympanic membrane normal.     Nose: Nose  normal.  Eyes:     Extraocular Movements: Extraocular movements intact.  Cardiovascular:     Rate and Rhythm: Normal rate and regular rhythm.     Pulses: Normal pulses.     Heart sounds: Normal heart sounds.  Pulmonary:     Effort: Pulmonary effort is normal.     Breath sounds: Normal breath sounds.  Abdominal:     General: Bowel sounds are normal.  Musculoskeletal:        General: Normal range of motion.     Cervical back: Rigidity present.     Comments: Decrease ROM right side able to left arm 30 degrees  Skin:    General: Skin is warm and dry.  Neurological:     Mental Status: He is oriented to person, place, and time.  Psychiatric:        Mood and Affect: Mood normal.        Behavior: Behavior normal.        Thought Content: Thought content normal.        Judgment: Judgment normal.     BP 131/82   Pulse 76   Temp 98.1 F (36.7 C)   Resp 16   Ht _0  (1.676 m)   Wt 192 lb (87.1 kg)   SpO2 96%   BMI 30.99 kg/m  Wt Readings from Last 3 Encounters:  12/22/19 192 lb (87.1 kg)  07/19/19 179 lb 12.8 oz (81.6 kg)  06/24/19 178 lb (80.7 kg)     Health Maintenance Due  Topic Date Due  . Hepatitis C Screening  Never done  . COVID-19 Vaccine (1) Never done  . COLONOSCOPY  Never done  . INFLUENZA VACCINE  12/12/2019    There are no preventive care reminders to display for this patient.  No results found for: TSH Lab Results  Component Value Date   WBC 9.8 06/24/2019   HGB 15.1 06/24/2019   HCT 42.7 06/24/2019   MCV 92.6 06/24/2019   PLT 337 06/24/2019   Lab Results  Component Value Date   NA 141 06/24/2019   K 3.8 06/24/2019   CO2 26 06/24/2019   GLUCOSE 109 (H) 06/24/2019   BUN 7 06/24/2019   CREATININE 0.66 06/24/2019   BILITOT 0.7 06/16/2019   ALKPHOS 75 06/16/2019   AST 15 06/16/2019   ALT 14 06/16/2019   PROT 6.9 06/16/2019   ALBUMIN 4.5 06/16/2019   CALCIUM 9.4 06/24/2019   ANIONGAP 9 06/24/2019   Lab Results  Component Value  Date    CHOL 131 06/24/2019   Lab Results  Component Value Date   HDL 51 06/24/2019   Lab Results  Component Value Date   LDLCALC 65 06/24/2019   Lab Results  Component Value Date   TRIG 73 06/24/2019   Lab Results  Component Value Date   CHOLHDL 2.6 06/24/2019   Lab Results  Component Value Date   HGBA1C 4.9 02/23/2019      Assessment & Plan:  Diagnoses and all orders for this visit:  Hyperlipidemia, unspecified hyperlipidemia type Decrease your fatty foods, red meat, cheese, milk and increase fiber like whole grains and veggies. You can also add a fiber supplement like Metamucil or Benefiber.   Coronary artery disease involving native coronary artery of native heart without angina pectoris Diagnoses and all orders for this visit:  Essential hypertension Blood pressure close to goal  130/80, low-sodium, DASH diet, medication compliance, 150 minutes of moderate intensity exercise per week. -     amLODipine (NORVASC) 10 MG tablet; Take 1 tablet (10 mg total) by mouth daily. -     CMP14+EGFR  Hyperlipidemia, unspecified hyperlipidemia type  decrease your fatty foods, red meat, cheese, milk and increase fiber like whole grains and veggies. You can also add a fiber supplement like Metamucil or Benefiber.  -     Lipid Panel  Coronary artery disease involving native coronary artery of native heart without angina pectoris  followed by cardiologist Jettie Booze, MD   Screening PSA (prostate specific antigen) -     PSA  Colon cancer screening Lost kit new one given order still in place  Previous given awaiting resullts    Follow-up: Return in about 6 months (around 06/23/2020) for Labs /BP.    Kerin Perna, NP

## 2019-12-22 NOTE — Progress Notes (Signed)
HTN f/u  

## 2019-12-23 LAB — CMP14+EGFR
ALT: 17 IU/L (ref 0–44)
AST: 11 IU/L (ref 0–40)
Albumin/Globulin Ratio: 1.7 (ref 1.2–2.2)
Albumin: 4.5 g/dL (ref 3.8–4.9)
Alkaline Phosphatase: 114 IU/L (ref 48–121)
BUN/Creatinine Ratio: 14 (ref 9–20)
BUN: 9 mg/dL (ref 6–24)
Bilirubin Total: 0.4 mg/dL (ref 0.0–1.2)
CO2: 25 mmol/L (ref 20–29)
Calcium: 9.4 mg/dL (ref 8.7–10.2)
Chloride: 104 mmol/L (ref 96–106)
Creatinine, Ser: 0.65 mg/dL — ABNORMAL LOW (ref 0.76–1.27)
GFR calc Af Amer: 128 mL/min/{1.73_m2} (ref >59)
GFR calc non Af Amer: 110 mL/min/{1.73_m2} (ref >59)
Globulin, Total: 2.7 g/dL (ref 1.5–4.5)
Glucose: 129 mg/dL — ABNORMAL HIGH (ref 65–99)
Potassium: 4.5 mmol/L (ref 3.5–5.2)
Sodium: 142 mmol/L (ref 134–144)
Total Protein: 7.2 g/dL (ref 6.0–8.5)

## 2019-12-23 LAB — LIPID PANEL
Chol/HDL Ratio: 3 ratio (ref 0.0–5.0)
Cholesterol, Total: 155 mg/dL (ref 100–199)
HDL: 51 mg/dL (ref >39)
LDL Chol Calc (NIH): 94 mg/dL (ref 0–99)
Triglycerides: 46 mg/dL (ref 0–149)
VLDL Cholesterol Cal: 10 mg/dL (ref 5–40)

## 2019-12-23 LAB — HEPATITIS C ANTIBODY: Hep C Virus Ab: <0.1 s/co ratio (ref 0.0–0.9)

## 2019-12-23 LAB — PSA: Prostate Specific Ag, Serum: 1.1 ng/mL (ref 0.0–4.0)

## 2019-12-24 ENCOUNTER — Other Ambulatory Visit (INDEPENDENT_AMBULATORY_CARE_PROVIDER_SITE_OTHER): Payer: Self-pay | Admitting: Primary Care

## 2019-12-24 DIAGNOSIS — E785 Hyperlipidemia, unspecified: Secondary | ICD-10-CM

## 2019-12-24 MED ORDER — ATORVASTATIN CALCIUM 40 MG PO TABS: 40.0000 mg | ORAL_TABLET | Freq: Every day | ORAL | 1 refills | Status: DC

## 2019-12-24 MED FILL — ?ATORVASTATIN 40MG TABLET: 40 | 30 days supply | Qty: 30 | Fill #0

## 2020-01-04 ENCOUNTER — Other Ambulatory Visit: Payer: Self-pay

## 2020-01-04 ENCOUNTER — Ambulatory Visit (INDEPENDENT_AMBULATORY_CARE_PROVIDER_SITE_OTHER): Payer: Self-pay | Admitting: Licensed Clinical Social Worker

## 2020-01-04 DIAGNOSIS — G894 Chronic pain syndrome: Secondary | ICD-10-CM

## 2020-01-04 NOTE — BH Specialist Note (Signed)
LCSW placed follow up call to patient regarding recent provider visit.   Pt shared ongoing difficulty managing chronic pain. Pt is currently uninsured and reports difficulty completing Financial Counseling application. LCSW provided validation and encouragement. Information to obtain supportive documention was discussed, in addition, to other options for insurance, such as, [employer]CA and open enrollment.   Pt verbalized consent for LCSW to mail out resources discussed. Pt reports interest in insurance, in order to have referral completed to [facility], through PCP. Pt was strongly encouraged to continue with medication management and utilizing healthy coping skills to manage symptoms. No additional concerns noted.

## 2020-01-26 MED FILL — hydrOXYzine HCL 25 MG TABS: 25 | 30 days supply | Qty: 30 | Fill #1

## 2020-01-26 MED FILL — CYCLOBENZAPRINE 10 MG TAB: 10 | 10 days supply | Qty: 30 | Fill #0

## 2020-01-26 MED FILL — LOSARTAN POTASSIUM 25 MG TA: 25 | 30 days supply | Qty: 30 | Fill #0

## 2020-01-26 MED FILL — AMLODIPINE BESYLATE 10 MG T: 10 | 30 days supply | Qty: 30 | Fill #0

## 2020-01-26 MED FILL — ?ATORVASTATIN 40MG TABLET: 40 | 30 days supply | Qty: 30 | Fill #0

## 2020-03-11 ENCOUNTER — Other Ambulatory Visit: Payer: Self-pay | Admitting: Orthopaedic Surgery

## 2020-03-13 ENCOUNTER — Encounter (HOSPITAL_COMMUNITY): Payer: Self-pay

## 2020-03-13 ENCOUNTER — Other Ambulatory Visit: Payer: Self-pay | Admitting: Physician Assistant

## 2020-03-13 ENCOUNTER — Other Ambulatory Visit: Payer: Self-pay

## 2020-03-13 ENCOUNTER — Telehealth: Payer: Self-pay

## 2020-03-13 ENCOUNTER — Encounter (INDEPENDENT_AMBULATORY_CARE_PROVIDER_SITE_OTHER): Payer: Self-pay | Admitting: Primary Care

## 2020-03-13 ENCOUNTER — Other Ambulatory Visit (INDEPENDENT_AMBULATORY_CARE_PROVIDER_SITE_OTHER): Payer: Self-pay | Admitting: Primary Care

## 2020-03-13 ENCOUNTER — Emergency Department (HOSPITAL_COMMUNITY): Payer: HRSA Program

## 2020-03-13 ENCOUNTER — Encounter: Payer: Self-pay | Admitting: Orthopaedic Surgery

## 2020-03-13 ENCOUNTER — Emergency Department (HOSPITAL_COMMUNITY)
Admission: EM | Admit: 2020-03-13 | Discharge: 2020-03-14 | Disposition: A | Discharge status: Home / Self Care | Payer: HRSA Program | Attending: Emergency Medicine | Admitting: Emergency Medicine

## 2020-03-13 DIAGNOSIS — I10 Essential (primary) hypertension: Secondary | ICD-10-CM | POA: Insufficient documentation

## 2020-03-13 DIAGNOSIS — E119 Type 2 diabetes mellitus without complications: Secondary | ICD-10-CM | POA: Diagnosis not present

## 2020-03-13 DIAGNOSIS — M791 Myalgia, unspecified site: Secondary | ICD-10-CM | POA: Diagnosis present

## 2020-03-13 DIAGNOSIS — Z7982 Long term (current) use of aspirin: Secondary | ICD-10-CM | POA: Diagnosis not present

## 2020-03-13 DIAGNOSIS — I2510 Atherosclerotic heart disease of native coronary artery without angina pectoris: Secondary | ICD-10-CM | POA: Insufficient documentation

## 2020-03-13 DIAGNOSIS — G894 Chronic pain syndrome: Secondary | ICD-10-CM

## 2020-03-13 DIAGNOSIS — Z79899 Other long term (current) drug therapy: Secondary | ICD-10-CM | POA: Diagnosis not present

## 2020-03-13 DIAGNOSIS — U071 COVID-19: Secondary | ICD-10-CM | POA: Insufficient documentation

## 2020-03-13 LAB — CBC
HCT: 46.4 % (ref 39.0–52.0)
Hemoglobin: 15.9 g/dL (ref 13.0–17.0)
MCH: 31 pg (ref 26.0–34.0)
MCHC: 34.3 g/dL (ref 30.0–36.0)
MCV: 90.4 fL (ref 80.0–100.0)
Platelets: 297 10*3/uL (ref 150–400)
RBC: 5.13 MIL/uL (ref 4.22–5.81)
RDW: 12.4 % (ref 11.5–15.5)
WBC: 6.2 10*3/uL (ref 4.0–10.5)
nRBC: 0 % (ref 0.0–0.2)

## 2020-03-13 LAB — BASIC METABOLIC PANEL
Anion gap: 13 (ref 5–15)
BUN: 8 mg/dL (ref 6–20)
CO2: 23 mmol/L (ref 22–32)
Calcium: 9 mg/dL (ref 8.9–10.3)
Chloride: 99 mmol/L (ref 98–111)
Creatinine, Ser: 0.68 mg/dL (ref 0.61–1.24)
GFR, Estimated: >60 mL/min (ref >60)
Glucose, Bld: 133 mg/dL — ABNORMAL HIGH (ref 70–99)
Potassium: 3.3 mmol/L — ABNORMAL LOW (ref 3.5–5.1)
Sodium: 135 mmol/L (ref 135–145)

## 2020-03-13 LAB — TROPONIN I (HIGH SENSITIVITY)
Troponin I (High Sensitivity): 6 ng/L (ref <18)
Troponin I (High Sensitivity): 5 ng/L (ref <18)

## 2020-03-13 MED ORDER — OXYCODONE-ACETAMINOPHEN 5-325 MG PO TABS
1.0000 | ORAL_TABLET | Freq: Once | ORAL | Status: AC
  Administered 2020-03-13: 1 via ORAL
  Filled 2020-03-13: qty 1

## 2020-03-13 MED FILL — AMLODIPINE BESYLATE 10 MG T: 10 | 30 days supply | Qty: 30 | Fill #1

## 2020-03-13 MED FILL — ?ATORVASTATIN 40MG TABLET: 40 | 30 days supply | Qty: 30 | Fill #1

## 2020-03-13 MED FILL — CYCLOBENZAPRINE 10 MG TAB: 10 | 10 days supply | Qty: 30 | Fill #0

## 2020-03-13 MED FILL — LOSARTAN POTASSIUM 25 MG TA: 25 | 30 days supply | Qty: 30 | Fill #1

## 2020-03-13 NOTE — Telephone Encounter (Signed)
Sent to PCP ?

## 2020-03-13 NOTE — Telephone Encounter (Signed)
Copied from CRM [id number]. Topic: Quick Communication - See Telephone Encounter >> Mar 13, 2020  9:38 AM Aretta Nip wrote: CRM for notification. See Telephone encounter for: 03/13/20. Pt wants return call from [facility] due to being told needs to have back surgury and with no ins wants to know what options may have? FU at [phone] >> Mar 13, 2020  9:58 AM Marylen Ponto wrote: Pt spouse stated pt was told that he would get a list of neurologist that he can see through [facility] but they have not received it. Pt spouse stated they would like to know which neurologist pcp recommends.    Please advice

## 2020-03-13 NOTE — Telephone Encounter (Signed)
Refill

## 2020-03-13 NOTE — Telephone Encounter (Signed)
Please advise 

## 2020-03-13 NOTE — ED Triage Notes (Signed)
Pt presents with all over body aches, fever and chills since being out in the rain Friday. Pt reports pressure across his chest and no appetite since Friday

## 2020-03-14 ENCOUNTER — Other Ambulatory Visit (HOSPITAL_COMMUNITY): Payer: Self-pay | Admitting: Emergency Medicine

## 2020-03-14 LAB — RESPIRATORY PANEL BY RT PCR (FLU A&B, COVID)
Influenza A by PCR: NEGATIVE
Influenza B by PCR: NEGATIVE
SARS Coronavirus 2 by RT PCR: POSITIVE — AB

## 2020-03-14 MED ORDER — BENZONATATE 100 MG PO CAPS: 100.0000 mg | ORAL_CAPSULE | Freq: Three times a day (TID) | ORAL | 0 refills | Status: DC

## 2020-03-14 MED ORDER — ALBUTEROL SULFATE HFA 108 (90 BASE) MCG/ACT IN AERS
2.0000 | INHALATION_SPRAY | RESPIRATORY_TRACT | Status: DC | PRN
  Filled 2020-03-14: qty 6.7

## 2020-03-14 MED ORDER — ONDANSETRON 4 MG PO TBDP: 4.0000 mg | ORAL_TABLET | Freq: Three times a day (TID) | ORAL | 0 refills | Status: DC | PRN

## 2020-03-14 NOTE — ED Provider Notes (Signed)
[facility] Provider Note   CSN: [id number] Arrival date & time: 03/13/20  1830     History Chief Complaint  Patient presents with  . Chest Pain  . Generalized Body Aches    [facility] is a 54 y.o. male.  Patient presents to the emergency department with chief complaint of generalized body aches.  He reports having symptoms for the past 4 days.  He denies any fevers or chills.  Denies any cough.  He states that he has had some chest tightness.  He is not vaccinated against Covid-19.  He denies any known exposures to Covid-19.  He denies any nausea, vomiting, or diarrhea.  He denies any treatments prior to arrival.  He states that his symptoms started after he was out in the rain.  The history is provided by the patient. No language interpreter was used.       Past Medical History:  Diagnosis Date  . Chronic back pain   . Coronary artery disease   . Diabetes mellitus without complication [facility])     Patient Active Problem List   Diagnosis Date Noted  . CAD (coronary artery disease) 03/22/2019  . Hypertension 02/23/2019  . Hyperlipidemia 02/23/2019  . Chest pain 02/22/2019    Past Surgical History:  Procedure Laterality Date  . BACK SURGERY    . SHOULDER SURGERY         Family History  Problem Relation Age of Onset  . Heart disease Maternal Uncle     Social History   Tobacco Use  . Smoking status: Never Smoker  . Smokeless tobacco: Never Used  Substance Use Topics  . Alcohol use: Yes    Comment: occasional  . Drug use: Yes    Types: Marijuana    Home Medications Prior to Admission medications   Medication Sig Start Date End Date Taking? Authorizing Provider  amLODipine (NORVASC) 10 MG tablet Take 1 tablet (10 mg total) by mouth daily. 12/22/19   Grayce Sessions, NP  aspirin 81 MG EC tablet Take 81 mg by mouth daily.    [provider]  atorvastatin (LIPITOR) 40 MG tablet Take 1 tablet (40 mg  total) by mouth daily at 6 PM. 12/24/19   Grayce Sessions, NP  cyclobenzaprine (FLEXERIL) 10 MG tablet TAKE 1 TABLET (10 MG TOTAL) BY MOUTH 3 (THREE) TIMES DAILY AS NEEDED FOR MUSCLE SPASMS. 03/13/20   Kathryne Hitch, MD  FLUoxetine (PROZAC) 20 MG tablet Take 1 tablet (20 mg total) by mouth daily. 06/16/19   Grayce Sessions, NP  hydrOXYzine (ATARAX/VISTARIL) 25 MG tablet TAKE 1 TABLET (25 MG TOTAL) BY MOUTH AT BEDTIME AS NEEDED FOR ANXIETY. 10/05/19   Grayce Sessions, NP  losartan (COZAAR) 25 MG tablet Take 1 tablet (25 mg total) by mouth daily. 07/19/19   Corky Crafts, MD  nitroGLYCERIN (NITROSTAT) 0.4 MG SL tablet Place 1 tablet (0.4 mg total) under the tongue every 5 (five) minutes as needed for chest pain. 04/26/19   Grayce Sessions, NP  tiZANidine (ZANAFLEX) 4 MG tablet TAKE 1 TABLET BY MOUTH EVERY 8 HOURS AS NEEDED FOR MUSCLE SPASMS 03/13/20   Kathryne Hitch, MD    Allergies    Patient has no known allergies.  Review of Systems   Review of Systems  All other systems reviewed and are negative.   Physical Exam Updated Vital Signs BP (!) 150/86   Pulse 94   Temp 98.4 F (36.9 C) (  Oral)   Resp 17   SpO2 97%   Physical Exam Vitals and nursing note reviewed.  Constitutional:      Appearance: He is well-developed.  HENT:     Head: Normocephalic and atraumatic.  Eyes:     Conjunctiva/sclera: Conjunctivae normal.  Cardiovascular:     Rate and Rhythm: Normal rate and regular rhythm.     Heart sounds: No murmur heard.   Pulmonary:     Effort: Pulmonary effort is normal. No respiratory distress.     Breath sounds: Normal breath sounds.  Abdominal:     Palpations: Abdomen is soft.     Tenderness: There is no abdominal tenderness.  Musculoskeletal:        General: Normal range of motion.     Cervical back: Neck supple.  Skin:    General: Skin is warm and dry.  Neurological:     Mental Status: He is alert and oriented to person, place, and  time.  Psychiatric:        Mood and Affect: Mood normal.        Behavior: Behavior normal.     ED Results / Procedures / Treatments   Labs (all labs ordered are listed, but only abnormal results are displayed) Labs Reviewed  BASIC METABOLIC PANEL - Abnormal; Notable for the following components:      Result Value   Potassium 3.3 (*)    Glucose, Bld 133 (*)    All other components within normal limits  RESPIRATORY PANEL BY RT PCR (FLU A&B, COVID)  CBC  TROPONIN I (HIGH SENSITIVITY)  TROPONIN I (HIGH SENSITIVITY)    EKG None  Radiology DG Chest 2 View  Result Date: 03/13/2020 CLINICAL DATA:  Fever and chills, diffuse body pain, chest pressure EXAM: CHEST - 2 VIEW COMPARISON:  06/24/2019 FINDINGS: Frontal and lateral views of the chest demonstrate an unremarkable cardiac silhouette. Chronic elevation of the right hemidiaphragm. No airspace disease, effusion, or pneumothorax. No acute bony abnormalities. IMPRESSION: 1. No acute intrathoracic process. Electronically Signed   By: Sharlet Salina M.D.   On: 03/13/2020 19:09    Procedures Procedures (including critical care time)  Medications Ordered in ED Medications  oxyCODONE-acetaminophen (PERCOCET/ROXICET) 5-325 MG per tablet 1 tablet (1 tablet Oral Given 03/13/20 1844)    ED Course  I have reviewed the triage vital signs and the nursing notes.  Pertinent labs & imaging results that were available during my care of the patient were reviewed by me and considered in my medical decision making (see chart for details).    MDM Rules/Calculators/A&P                          [name] was evaluated in Emergency Department on 03/14/2020 for the symptoms described in the history of present illness. He was evaluated in the context of the global COVID-19 pandemic, which necessitated consideration that the patient might be at risk for infection with the SARS-CoV-2 virus that causes COVID-19. Institutional protocols and algorithms  that pertain to the evaluation of patients at risk for COVID-19 are in a state of rapid change based on information released by regulatory bodies including the CDC and federal and state organizations. These policies and algorithms were followed during the patient's care in the ED.  Patient here with generalized body aches.  He has been having symptoms for the past 4 to 5 days.  He denies any cough or fever.  Denies any vomiting.  Denies any Covid exposures.  Covid test is positive, otherwise laboratory work-up is reassuring.  I have encouraged patient to quarantine at home.  Secure chat to [facility] infusion team for consideration of outpatient [facility] if patient qualifies.  Discharge home with Tessalon and Zofran.  Return precautions discussed.   Final Clinical Impression(s) / ED Diagnoses Final diagnoses:  COVID-19    Rx / DC Orders ED Discharge Orders    None       Roxy Horseman, PA-C 03/14/20 0612    Sabino Donovan, MD 03/14/20 1406

## 2020-03-14 NOTE — ED Notes (Signed)
Date and time results received: 03/14/20 0543 (use smartphrase ".now" to insert current time)  Test: COVID Critical Value: Positive  Name of Provider Notified: Myrtis Ser  Orders Received? Or Actions Taken?: Isolation Precautions Initiated.

## 2020-03-14 NOTE — Discharge Instructions (Addendum)
You must quarantine at home for 10 days.  If your symptoms change or worsen, return to the emergency department.

## 2020-03-14 NOTE — Telephone Encounter (Signed)
LCSW placed return call to patient. There was no answer or option to leave a voice message.   LCSW informed PCP of message received from [employer]. PCP will respond to patient through MyChart.

## 2020-03-15 ENCOUNTER — Ambulatory Visit (HOSPITAL_COMMUNITY)
Admission: RE | Admit: 2020-03-15 | Discharge: 2020-03-15 | Disposition: A | Discharge status: Home / Self Care | Payer: Medicaid Other | Source: Ambulatory Visit | Attending: Pulmonary Disease | Admitting: Pulmonary Disease

## 2020-03-15 ENCOUNTER — Encounter: Payer: Self-pay | Admitting: Physician Assistant

## 2020-03-15 ENCOUNTER — Other Ambulatory Visit: Payer: Self-pay | Admitting: Physician Assistant

## 2020-03-15 DIAGNOSIS — I2510 Atherosclerotic heart disease of native coronary artery without angina pectoris: Secondary | ICD-10-CM

## 2020-03-15 DIAGNOSIS — E119 Type 2 diabetes mellitus without complications: Secondary | ICD-10-CM | POA: Insufficient documentation

## 2020-03-15 DIAGNOSIS — U071 COVID-19: Secondary | ICD-10-CM | POA: Diagnosis not present

## 2020-03-15 DIAGNOSIS — M549 Dorsalgia, unspecified: Secondary | ICD-10-CM | POA: Insufficient documentation

## 2020-03-15 DIAGNOSIS — I10 Essential (primary) hypertension: Secondary | ICD-10-CM | POA: Insufficient documentation

## 2020-03-15 DIAGNOSIS — G8929 Other chronic pain: Secondary | ICD-10-CM | POA: Insufficient documentation

## 2020-03-15 MED ORDER — METHYLPREDNISOLONE SODIUM SUCC 125 MG IJ SOLR: 125.0000 mg | Freq: Once | INTRAMUSCULAR | Status: DC | PRN

## 2020-03-15 MED ORDER — FAMOTIDINE IN NACL 20-0.9 MG/50ML-% IV SOLN: 20.0000 mg | Freq: Once | INTRAVENOUS | Status: DC | PRN

## 2020-03-15 MED ORDER — SODIUM CHLORIDE 0.9 % IV SOLN: INTRAVENOUS | Status: DC | PRN

## 2020-03-15 MED ORDER — ALBUTEROL SULFATE HFA 108 (90 BASE) MCG/ACT IN AERS: 2.0000 | INHALATION_SPRAY | Freq: Once | RESPIRATORY_TRACT | Status: DC | PRN

## 2020-03-15 MED ORDER — EPINEPHRINE 0.3 MG/0.3ML IJ SOAJ: 0.3000 mg | Freq: Once | INTRAMUSCULAR | Status: DC | PRN

## 2020-03-15 MED ORDER — DIPHENHYDRAMINE HCL 50 MG/ML IJ SOLN: 50.0000 mg | Freq: Once | INTRAMUSCULAR | Status: DC | PRN

## 2020-03-15 MED ORDER — SOTROVIMAB 500 MG/8ML IV SOLN
500.0000 mg | Freq: Once | INTRAVENOUS | Status: AC
  Administered 2020-03-15: 500 mg via INTRAVENOUS

## 2020-03-15 MED FILL — BENZONATATE 100 MG CAPS: 100 | 7 days supply | Qty: 21 | Fill #0

## 2020-03-15 MED FILL — ONDANSETRON ODT 4 MG TABLET: 4 | 3 days supply | Qty: 10 | Fill #0

## 2020-03-15 MED FILL — hydrOXYzine HCL 25 MG TABS: 25 | 30 days supply | Qty: 30 | Fill #0

## 2020-03-15 NOTE — Progress Notes (Signed)
Diagnosis: COVID-19  Physician: Dr. Wright  Procedure: Sotrovimab  Complications: No immediate complications noted.  Discharge: Discharged home   Bijan Ridgley J Yizel Canby 03/15/2020 

## 2020-03-15 NOTE — Discharge Instructions (Signed)

## 2020-03-15 NOTE — Telephone Encounter (Signed)
Needs appointment for refills.

## 2020-03-15 NOTE — Progress Notes (Addendum)
I connected by phone with [name] on 03/15/2020 at 8:08 AM to discuss the potential use of a new treatment for mild to moderate COVID-19 viral infection in non-hospitalized patients.  This patient is a 54 y.o. male that meets the FDA criteria for Emergency Use Authorization of COVID monoclonal antibody sotrovimab, casirivimab/imdevimab or bamlamivimab/estevimab.  Has a (+) direct SARS-CoV-2 viral test result  Has mild or moderate COVID-19   Is NOT hospitalized due to COVID-19  Is within 10 days of symptom onset  Has at least one of the high risk factor(s) for progression to severe COVID-19 and/or hospitalization as defined in EUA.  Specific high risk criteria : BMI > 25, Diabetes and Cardiovascular disease or hypertension, high SVI   I have spoken and communicated the following to the patient or parent/caregiver regarding COVID monoclonal antibody treatment:  1. FDA has authorized the emergency use for the treatment of mild to moderate COVID-19 in adults and pediatric patients with positive results of direct SARS-CoV-2 viral testing who are 97 years of age and older weighing at least 40 kg, and who are at high risk for progressing to severe COVID-19 and/or hospitalization.  2. The significant known and potential risks and benefits of COVID monoclonal antibody, and the extent to which such potential risks and benefits are unknown.  3. Information on available alternative treatments and the risks and benefits of those alternatives, including clinical trials.  4. Patients treated with COVID monoclonal antibody should continue to self-isolate and use infection control measures (e.g., wear mask, isolate, social distance, avoid sharing personal items, clean and disinfect "high touch" surfaces, and frequent handwashing) according to CDC guidelines.   5. The patient or parent/caregiver has the option to accept or refuse COVID monoclonal antibody treatment.  After reviewing this  information with the patient, the patient has agreed to receive one of the available covid 19 monoclonal antibodies and will be provided an appropriate fact sheet prior to infusion.  Sx onset 10/29. Set up for infusion on 11/3 @ 10:30am. Directions given to [facility]. Pt is aware that insurance will be charged an infusion fee. Pt is unvaccinated.   Cline Crock 03/15/2020 8:08 AM

## 2020-03-24 ENCOUNTER — Ambulatory Visit (INDEPENDENT_AMBULATORY_CARE_PROVIDER_SITE_OTHER): Payer: Self-pay | Admitting: *Deleted

## 2020-03-24 NOTE — Telephone Encounter (Signed)
Patient is calling to ask when should he be retested after testing positive for COVID? Patient tested positive on 03/13/20- Patient also went to the infusion clinic. Please advise  Patient wants to know about retesting after + COVID and treatment: Advised per CDC recommendations- no need to retest after + COVID for 90 days- patient understands and has follow up appointment with PCP.  Reason for Disposition . COVID-19 Testing, questions about  Protocols used: CORONAVIRUS (COVID-19) DIAGNOSED OR SUSPECTED-A-AH

## 2020-04-03 ENCOUNTER — Telehealth (INDEPENDENT_AMBULATORY_CARE_PROVIDER_SITE_OTHER): Payer: Medicaid Other | Admitting: Primary Care

## 2020-04-10 ENCOUNTER — Other Ambulatory Visit: Payer: Self-pay | Admitting: Orthopaedic Surgery

## 2020-04-10 NOTE — Telephone Encounter (Signed)
Ok to refill 

## 2020-04-17 MED FILL — AMLODIPINE BESYLATE 10 MG T: 10 | 30 days supply | Qty: 30 | Fill #2

## 2020-04-17 MED FILL — ?ATORVASTATIN 40MG TABLET: 40 | 30 days supply | Qty: 30 | Fill #2

## 2020-04-17 MED FILL — CYCLOBENZAPRINE 10 MG TAB: 10 | 10 days supply | Qty: 30 | Fill #0

## 2020-04-17 MED FILL — ?LOSARTAN POTASSI 25MG TAB: 25 | 30 days supply | Qty: 30 | Fill #2

## 2020-05-01 ENCOUNTER — Telehealth: Payer: Self-pay | Admitting: Primary Care

## 2020-05-01 ENCOUNTER — Ambulatory Visit
Payer: Self-pay | Attending: Student in an Organized Health Care Education/Training Program | Admitting: Student in an Organized Health Care Education/Training Program

## 2020-05-01 ENCOUNTER — Other Ambulatory Visit: Payer: Self-pay | Admitting: Student in an Organized Health Care Education/Training Program

## 2020-05-01 ENCOUNTER — Encounter: Payer: Self-pay | Admitting: Student in an Organized Health Care Education/Training Program

## 2020-05-01 ENCOUNTER — Other Ambulatory Visit: Payer: Self-pay

## 2020-05-01 DIAGNOSIS — G8929 Other chronic pain: Secondary | ICD-10-CM

## 2020-05-01 DIAGNOSIS — Z981 Arthrodesis status: Secondary | ICD-10-CM

## 2020-05-01 DIAGNOSIS — G959 Disease of spinal cord, unspecified: Secondary | ICD-10-CM | POA: Insufficient documentation

## 2020-05-01 DIAGNOSIS — M5412 Radiculopathy, cervical region: Secondary | ICD-10-CM

## 2020-05-01 DIAGNOSIS — M5416 Radiculopathy, lumbar region: Secondary | ICD-10-CM | POA: Insufficient documentation

## 2020-05-01 DIAGNOSIS — G894 Chronic pain syndrome: Secondary | ICD-10-CM

## 2020-05-01 DIAGNOSIS — M4802 Spinal stenosis, cervical region: Secondary | ICD-10-CM

## 2020-05-01 MED ORDER — PREGABALIN 50 MG PO CAPS: ORAL_CAPSULE | ORAL | 0 refills | Status: DC

## 2020-05-01 MED ORDER — METHYLPREDNISOLONE 4 MG PO TBPK: ORAL_TABLET | ORAL | 0 refills | Status: DC

## 2020-05-01 MED FILL — METHYLPREDNISOLONE 4 MG TAB: 4 | 6 days supply | Qty: 21 | Fill #0

## 2020-05-01 NOTE — Progress Notes (Signed)
Patient: Scott Tucker  Service Category: E/M  Provider: Gillis Santa, MD  DOB: 1965/06/07  DOS: 05/01/2020  Referring Provider: Kerin Perna, NP  MRN: [id number]  Setting: Ambulatory outpatient  PCP: Kerin Perna, NP  Type: New Patient  Specialty: Interventional Pain Management    Location: Office  Delivery: Face-to-face     Primary Reason(s) for Visit: Encounter for initial evaluation of one or more chronic problems (new to examiner) potentially causing chronic pain, and posing a threat to normal musculoskeletal function. (Level of risk: High) CC: Back Pain (Lumbar left is worse ), Neck Pain (More on the left ), Leg Pain (Bilateral, left is worse ), and Arm Pain (Limited ROM on the right )  HPI  Scott Tucker is a 54 y.o. year old, male patient, who comes for the first time to our practice referred by Kerin Perna, NP for our initial evaluation of his chronic pain. He has Chest pain; Hypertension; Hyperlipidemia; CAD (coronary artery disease); HTN (hypertension); Diabetes mellitus without complication ([place]); Chronic back pain; Coronary artery disease; Chronic radicular lumbar pain; Foraminal stenosis of cervical region (right); Chronic pain syndrome; Cervical myelopathy with cervical radiculopathy (HCC); and History of lumbar fusion on their problem list. Today he comes in for evaluation of his Back Pain (Lumbar left is worse ), Neck Pain (More on the left ), Leg Pain (Bilateral, left is worse ), and Arm Pain (Limited ROM on the right )  Pain Assessment: Location: Lower,Left,Right Back (neck) Radiating: back pain down both legs  to the back of knees more on the left and neck pain into arms more on the left. Onset: More than a month ago Duration: Chronic pain Quality: Discomfort,Constant,Numbness,Aching,Burning,Pressure,Nagging,Sharp,Throbbing,Shooting,Tiring Severity: 10-Worst pain ever/10 (subjective, self-reported pain score)  Effect on ADL: affects his gait, feels as if his  foot is dragging.  sleep disruption. Timing: Constant Modifying factors: nothing currently BP:    HR:    Onset and Duration: Date of onset: 1992 Cause of pain: Worker's Compensation Injury Severity: Getting worse, NAS-11 at its worse: 10/10, NAS-11 at its best: 8/10, NAS-11 now: 10/10 and NAS-11 on the average: 10/10 Timing: Not influenced by the time of the day Aggravating Factors: Bending, Climbing, Intercourse (sex), Kneeling, Lifiting, Motion, Prolonged sitting, Prolonged standing, Squatting, Stooping , Surgery made it worse, Twisting, Walking, Walking uphill, Walking downhill and Working Alleviating Factors: na Associated Problems: Depression, Dizziness, Fatigue, Inability to concentrate, Numbness, Personality changes, Sadness, Spasms, Sweating, Tingling, Weakness, Pain that wakes patient up and Pain that does not allow patient to sleep Quality of Pain: Aching, Burning, Constant, Disabling, Nagging, Pressure-like, Pulsating, Sharp, Shooting, Throbbing, Tingling, Tiring, Toothache-like and Uncomfortable Previous Examinations or Tests: CT scan, MRI scan and X-rays Previous Treatments: Physical Therapy, Stretching exercises and TENS  [name] is a pleasant 54 year old male who presents with a chief complaint of neck pain that radiates down bilateral arms, right greater than left in a dermatomal fashion.  This is been going on for many months and has gotten worse.  Patient states that he is also dropping items in his right hand.  He was previously being seen at a pain management clinic in [place].  Of note he has tried gabapentin in the past for his low back and leg pain (described below) which he states was not effective.  He has done physical therapy and stretching exercises in the past.  He endorses weakness of his right upper extremity.  He is limited in performing ADLs given the pain and weakness that  he is experiencing in his right arm.  Patient also has a history of lumbar spine surgery  (L5-S1 fusion) in the past and also complains of low back pain with radiation into bilateral lower extremities, left more than right no dermatomal fashion.  He has had an epidural steroid injection for this in the past prior to his lumbar spine surgery which he states provides limited benefit.  His primary pain complaint today is his neck pain that radiates into his right arm.   Meds   Current Outpatient Medications:  .  amLODipine (NORVASC) 10 MG tablet, Take 1 tablet (10 mg total) by mouth daily., Disp: 90 tablet, Rfl: 1 .  aspirin 81 MG EC tablet, Take 81 mg by mouth daily., Disp: , Rfl:  .  atorvastatin (LIPITOR) 40 MG tablet, Take 1 tablet (40 mg total) by mouth daily at 6 PM., Disp: 90 tablet, Rfl: 1 .  benzonatate (TESSALON) 100 MG capsule, Take 1 capsule (100 mg total) by mouth every 8 (eight) hours., Disp: 21 capsule, Rfl: 0 .  cyclobenzaprine (FLEXERIL) 10 MG tablet, TAKE 1 TABLET (10 MG TOTAL) BY MOUTH 3 (THREE) TIMES DAILY AS NEEDED FOR MUSCLE SPASMS., Disp: 30 tablet, Rfl: 0 .  FLUoxetine (PROZAC) 20 MG tablet, Take 1 tablet (20 mg total) by mouth daily., Disp: 90 tablet, Rfl: 1 .  hydrOXYzine (ATARAX/VISTARIL) 25 MG tablet, TAKE 1 TABLET (25 MG TOTAL) BY MOUTH AT BEDTIME AS NEEDED FOR ANXIETY., Disp: 30 tablet, Rfl: 0 .  losartan (COZAAR) 25 MG tablet, Take 1 tablet (25 mg total) by mouth daily., Disp: 90 tablet, Rfl: 3 .  nitroGLYCERIN (NITROSTAT) 0.4 MG SL tablet, Place 1 tablet (0.4 mg total) under the tongue every 5 (five) minutes as needed for chest pain., Disp: 75 tablet, Rfl: 1 .  methylPREDNISolone (MEDROL) 4 MG TBPK tablet, Follow package instructions., Disp: 21 tablet, Rfl: 0 .  pregabalin (LYRICA) 50 MG capsule, Take 1 capsule (50 mg total) by mouth at bedtime for 20 days, THEN 1 capsule (50 mg total) 2 (two) times daily., Disp: 100 capsule, Rfl: 0  Imaging Review  Cervical Imaging: Cervical MR wo contrast: Results for orders placed during the hospital encounter of  07/04/19  MR Cervical Spine w/o contrast  Narrative CLINICAL DATA:  Cervical spine stenosis with chronic right shoulder pain  EXAM: MRI CERVICAL SPINE WITHOUT CONTRAST  TECHNIQUE: Multiplanar, multisequence MR imaging of the cervical spine was performed. No intravenous contrast was administered.  COMPARISON:  None.  FINDINGS: Alignment: Reversal of cervical lordosis.  Vertebrae: Discogenic endplate edema at R6-0. No fracture, discitis, or aggressive lesion.  Cord: Degenerative compression noted above below. No cord signal abnormality.  Posterior Fossa, vertebral arteries, paraspinal tissues: Bilateral maxillary sinusitis with fluid level.  Disc levels:  C2-3: Unremarkable.  C3-4: Disc bulging with right paracentral inferiorly migrating extrusion flattening the cord. Right uncovertebral ridging and foraminal impingement.  C4-5: Disc narrowing and bulging with endplate ridging and a right paracentral protrusion compressing the right cord. Disc narrowing and uncovertebral spurring causes biforaminal impingement.  C5-6: Disc narrowing and bulging with ridging of the endplates and uncovertebral joints on the left more than right. Left more than right foraminal impingement that is high-grade  C6-7: Disc narrowing with uncovertebral ridging and high-grade left foraminal impingement.  C7-T1:Unremarkable.  IMPRESSION: 1. C3-4 and C4-5 cord flattening due to disc herniations. 2. High-grade foraminal impingement on the right at C3-4, bilaterally at C4-5 and C5-6, and on the left at C6-7. 3. Bilateral maxillary sinusitis  with fluid levels.   Electronically Signed By: Monte Fantasia M.D. On: 07/04/2019 12:01  MR SHOULDER RIGHT WO CONTRAST  Narrative CLINICAL DATA:  Severe right shoulder pain for 6 months. Previous surgery in 2018.  EXAM: MRI OF THE RIGHT SHOULDER WITHOUT CONTRAST  TECHNIQUE: Multiplanar, multisequence MR imaging of the shoulder was  performed. No intravenous contrast was administered.  COMPARISON:  None.  FINDINGS: Rotator cuff:  The rotator cuff is intact and normal.  Muscles:  No atrophy or edema of the muscles of the rotator cuff.  Biceps long head:  Properly located and intact.  Acromioclavicular Joint: Previous resection of the distal clavicle. Type 1 acromion. No bursitis.  Glenohumeral Joint: Normal.  Labrum:  Intact.  Bones:  Normal.  Other: None  IMPRESSION: No significant abnormality of the right shoulder. Previous resection of the distal clavicle.   Electronically Signed By: Lorriane Shire M.D. On: 06/23/2019 09:57  Lumbosacral Imaging: Lumbar MR wo contrast: No results found for this or any previous visit.  Lumbar MR wo contrast: No valid procedures specified. Lumbar MR w/wo contrast: Results for orders placed during the hospital encounter of 08/29/04  MR Lumbar Spine W Wo Contrast  Narrative Clinical Data: Low back and bilateral leg pain. Previous fusion at L5-S1. MRI OF THE LUMBAR SPINE WITHOUT AND WITH CONTRAST - 08/29/04: Scans were performed following the intravenous injection of 18 cc of Omniscan. The scan extends from T11-12 through the second sacral segment. Conus medullaris terminates at L1 and appears normal. The disks from T11-12 through L4-5 are normal. There is a fusion at L5-S1 with an interbody fusion device in the disk space. There is some minimal posterior spurring of the end plates of O8-N8 with no indentation upon the thecal sac or nerve roots of significance. There is some scarring around the thecal sac, particularly around the right S1 nerve root and around the right posterolateral aspect of the sac at the operative level. This scar tissue enhances intensely after gadolinium administration. Interpedicular screws are seen at L5-S1 on the right and there is a screw through the facet joint at L5-S1 on the left. The neural foramina are widely patent bilaterally throughout  the lumbar spine. There is no spinal stenosis or appreciable facet joint disease. The visualized paraspinal musculature appears normal.  Impression Post surgical changes at L5-S1. Some scarring around the thecal sac, primarily to the right side at L5-S1, which is expected. No new or recurrent disk herniations. No appreciable degenerative facet joint disease.  Provider: Lavonia Dana  Narrative Clinical Data: Lumbar spine fusion, September of 2005, follow up. Still has back pain. LUMBAR SPINE: Three views of the lumbar spine are compared to films of 03/07/04. Transpedicular fusion on the right at L5-S1 and transfacetal fusion at L5-S1 on the left appear stable. The interbody fusion plug is unchanged, and normal alignment is maintained.  Impression Stable fusion at L5-S1.  Provider: Nolene Bernheim DG Epidurography  Narrative Clinical Data: Back pain. The patient did not get significant improvement from the caudal injection. I elected to go with an S1 injection today. RIGHT S1 SELECTIVE NERVE ROOT BLOCK AND TRANSFORAMINAL EPIDURAL Following informed consent, including the risk of bleeding, infection, neurologic deficit, sterile preparation of the back, and adequate local anesthesia using 1% Lidocaine, a 22 gauge spinal needle was placed through the S1 foramen on the right. Contrast injection showed good central epidural spread upwards to the L5-S1 disk space.  I injected 120 mg Depo-Medrol along with 2 cc of 1% Lidocaine.  Post procedure, the patient was comfortable. IMPRESSION Technically successful right S1 selective nerve root block and transforaminal epidural #2.     Provider: Florian Buff   Complexity Note: Imaging results reviewed. Results shared with Mr. Clearman, using Layman's terms.                         ROS  Cardiovascular: Heart trouble, Daily Aspirin intake, High blood pressure and Chest pain Pulmonary or Respiratory: Shortness of breath and Snoring  Neurological:  No reported neurological signs or symptoms such as seizures, abnormal skin sensations, urinary and/or fecal incontinence, being born with an abnormal open spine and/or a tethered spinal cord Psychological-Psychiatric: Anxiousness, Depressed and Difficulty sleeping and or falling asleep Gastrointestinal: No reported gastrointestinal signs or symptoms such as vomiting or evacuating blood, reflux, heartburn, alternating episodes of diarrhea and constipation, inflamed or scarred liver, or pancreas or irrregular and/or infrequent bowel movements Genitourinary: No reported renal or genitourinary signs or symptoms such as difficulty voiding or producing urine, peeing blood, non-functioning kidney, kidney stones, difficulty emptying the bladder, difficulty controlling the flow of urine, or chronic kidney disease Hematological: No reported hematological signs or symptoms such as prolonged bleeding, low or poor functioning platelets, bruising or bleeding easily, hereditary bleeding problems, low energy levels due to low hemoglobin or being anemic Endocrine: No reported endocrine signs or symptoms such as high or low blood sugar, rapid heart rate due to high thyroid levels, obesity or weight gain due to slow thyroid or thyroid disease Rheumatologic: No reported rheumatological signs and symptoms such as fatigue, joint pain, tenderness, swelling, redness, heat, stiffness, decreased range of motion, with or without associated rash Musculoskeletal: Negative for myasthenia gravis, muscular dystrophy, multiple sclerosis or malignant hyperthermia Work History: Out of work due to pain  Allergies  Mr. Lefevers has No Known Allergies.  Laboratory Chemistry Profile   Renal Lab Results  Component Value Date   BUN 8 03/13/2020   CREATININE 0.68 03/13/2020   BCR 14 12/22/2019   GFRAA 128 12/22/2019   GFRNONAA >60 03/13/2020     Electrolytes Lab Results  Component Value Date   NA 135 03/13/2020   K 3.3 (L)  03/13/2020   CL 99 03/13/2020   CALCIUM 9.0 03/13/2020     Hepatic Lab Results  Component Value Date   AST 11 12/22/2019   ALT 17 12/22/2019   ALBUMIN 4.5 12/22/2019   ALKPHOS 114 12/22/2019     ID Lab Results  Component Value Date   HIV NON REACTIVE 02/22/2019   SARSCOV2NAA POSITIVE (A) 03/14/2020     Bone No results found for: VD25OH, SU110RP5XYV, OP9292KM6, KM6381RR1, 25OHVITD1, 25OHVITD2, 25OHVITD3, TESTOFREE, TESTOSTERONE   Endocrine Lab Results  Component Value Date   GLUCOSE 133 (H) 03/13/2020   HGBA1C 4.9 02/23/2019     Neuropathy Lab Results  Component Value Date   HGBA1C 4.9 02/23/2019   HIV NON REACTIVE 02/22/2019     CNS No results found for: COLORCSF, APPEARCSF, RBCCOUNTCSF, WBCCSF, POLYSCSF, LYMPHSCSF, EOSCSF, PROTEINCSF, GLUCCSF, JCVIRUS, CSFOLI, IGGCSF, LABACHR, ACETBL, LABACHR, ACETBL   Inflammation (CRP: Acute  ESR: Chronic) No results found for: CRP, ESRSEDRATE, LATICACIDVEN   Rheumatology No results found for: RF, ANA, LABURIC, URICUR, LYMEIGGIGMAB, LYMEABIGMQN, HLAB27   Coagulation Lab Results  Component Value Date   PLT 297 03/13/2020     Cardiovascular Lab Results  Component Value Date   HGB 15.9 03/13/2020   HCT 46.4 03/13/2020     Screening Lab Results  Component Value Date   SARSCOV2NAA POSITIVE (A) 03/14/2020   HIV NON REACTIVE 02/22/2019     Cancer No results found for: CEA, CA125, LABCA2   Allergens No results found for: ALMOND, APPLE, ASPARAGUS, AVOCADO, BANANA, BARLEY, BASIL, BAYLEAF, GREENBEAN, LIMABEAN, WHITEBEAN, BEEFIGE, REDBEET, BLUEBERRY, BROCCOLI, CABBAGE, MELON, CARROT, CASEIN, CASHEWNUT, CAULIFLOWER, CELERY     Note: Lab results reviewed.  [place]  Drug: Mr. Arizpe  reports current drug use. Drug: Marijuana. Alcohol:  reports current alcohol use. Tobacco:  reports that he has never smoked. He has never used smokeless tobacco. Medical:  has a past medical history of Chronic back pain, Coronary artery  disease, Diabetes mellitus without complication ([place]), and HTN (hypertension). Family: family history includes Heart disease in his maternal uncle.  Past Surgical History:  Procedure Laterality Date  . BACK SURGERY    . SHOULDER SURGERY     Active Ambulatory Problems    Diagnosis Date Noted  . Chest pain 02/22/2019  . Hypertension 02/23/2019  . Hyperlipidemia 02/23/2019  . CAD (coronary artery disease) 03/22/2019  . HTN (hypertension)   . Diabetes mellitus without complication ([place])   . Chronic back pain   . Coronary artery disease   . Chronic radicular lumbar pain 05/01/2020  . Foraminal stenosis of cervical region (right) 05/01/2020  . Chronic pain syndrome 05/01/2020  . Cervical myelopathy with cervical radiculopathy ([place]) 05/01/2020  . History of lumbar fusion 05/01/2020   Resolved Ambulatory Problems    Diagnosis Date Noted  . No Resolved Ambulatory Problems   No Additional Past Medical History   Constitutional Exam  General appearance: Well nourished, well developed, and well hydrated. In no apparent acute distress There were no vitals filed for this visit. BMI Assessment: Estimated body mass index is 30.99 kg/m as calculated from the following:   Height as of 12/22/19: 5' 6"  (1.676 m).   Weight as of 12/22/19: 192 lb (87.1 kg).  BMI interpretation table: BMI level Category Range association with higher incidence of chronic pain  <18 kg/m2 Underweight   18.5-24.9 kg/m2 Ideal body weight   25-29.9 kg/m2 Overweight Increased incidence by 20%  30-34.9 kg/m2 Obese (Class I) Increased incidence by 68%  35-39.9 kg/m2 Severe obesity (Class II) Increased incidence by 136%  >40 kg/m2 Extreme obesity (Class III) Increased incidence by 254%   Patient's current BMI Ideal Body weight  There is no height or weight on file to calculate BMI. Patient weight not recorded   BMI Readings from Last 4 Encounters:  12/22/19 30.99 kg/m  07/19/19 29.02 kg/m  06/24/19 28.73 kg/m   06/16/19 28.89 kg/m   Wt Readings from Last 4 Encounters:  12/22/19 192 lb (87.1 kg)  07/19/19 179 lb 12.8 oz (81.6 kg)  06/24/19 178 lb (80.7 kg)  06/16/19 179 lb (81.2 kg)    Psych/Mental status: Alert, oriented x 3 (person, place, & time)       Eyes: PERLA Respiratory: No evidence of acute respiratory distress  Cervical Spine Exam  Skin & Axial Inspection: No masses, redness, edema, swelling, or associated skin lesions Alignment: Symmetrical Functional ROM: Pain restricted ROM, bilaterally Stability: No instability detected Muscle Tone/Strength: Functionally intact. No obvious neuro-muscular anomalies detected. Sensory (Neurological): Dermatomal pain pattern right>left Palpation: No palpable anomalies             Positive Spurling's on the right Upper Extremity (UE) Exam    Side: Right upper extremity  Side: Left upper extremity  Skin & Extremity Inspection: Skin color, temperature, and hair growth  are WNL. No peripheral edema or cyanosis. No masses, redness, swelling, asymmetry, or associated skin lesions. No contractures.  Skin & Extremity Inspection: Skin color, temperature, and hair growth are WNL. No peripheral edema or cyanosis. No masses, redness, swelling, asymmetry, or associated skin lesions. No contractures.  Functional ROM: Pain restricted ROM for all joints of upper extremity  Functional ROM: Unrestricted ROM          Muscle Tone/Strength: Functionally intact. No obvious neuro-muscular anomalies detected.   Muscle Tone/Strength: Functionally intact. No obvious neuro-muscular anomalies detected.  Sensory (Neurological): Dermatomal pain pattern          Sensory (Neurological): Unimpaired          Palpation: No palpable anomalies              Palpation: No palpable anomalies              Provocative Test(s):  Phalen's test: deferred Tinel's test: deferred Apley's scratch test (touch opposite shoulder):  Action 1 (Across chest): Decreased ROM Action 2 (Overhead):  Decreased ROM Action 3 (LB reach): Decreased ROM   Provocative Test(s):  Phalen's test: deferred Tinel's test: deferred Apley's scratch test (touch opposite shoulder):  Action 1 (Across chest): deferred Action 2 (Overhead): deferred Action 3 (LB reach): deferred   4 out of 5 strength RIGHT upper extremity: Shoulder abduction, elbow flexion, elbow extension, thumb extension. 5 out of 5 strength LEFT upper extremity: Shoulder abduction, elbow flexion, elbow extension, thumb extension.   Lumbar Exam  Skin & Axial Inspection: Well healed scar from previous spine surgery detected Alignment: Symmetrical Functional ROM: Pain restricted ROM       Stability: No instability detected Muscle Tone/Strength: Functionally intact. No obvious neuro-muscular anomalies detected. Sensory (Neurological): Dermatomal pain pattern   Gait & Posture Assessment  Ambulation: Unassisted Gait: Relatively normal for age and body habitus Posture: WNL   Lower Extremity Exam    Side: Right lower extremity  Side: Left lower extremity  Stability: No instability observed          Stability: No instability observed          Skin & Extremity Inspection: Skin color, temperature, and hair growth are WNL. No peripheral edema or cyanosis. No masses, redness, swelling, asymmetry, or associated skin lesions. No contractures.  Skin & Extremity Inspection: Skin color, temperature, and hair growth are WNL. No peripheral edema or cyanosis. No masses, redness, swelling, asymmetry, or associated skin lesions. No contractures.  Functional ROM: Unrestricted ROM                  Functional ROM: Unrestricted ROM                  Muscle Tone/Strength: Functionally intact. No obvious neuro-muscular anomalies detected.  Muscle Tone/Strength: Functionally intact. No obvious neuro-muscular anomalies detected.  Sensory (Neurological): Unimpaired        Sensory (Neurological): Unimpaired        DTR: Patellar: deferred today Achilles:  deferred today Plantar: deferred today  DTR: Patellar: deferred today Achilles: deferred today Plantar: deferred today  Palpation: No palpable anomalies  Palpation: No palpable anomalies   Assessment  Primary Diagnosis & Pertinent Problem List: The primary encounter diagnosis was Chronic radicular cervical pain (right). Diagnoses of Cervical myelopathy with cervical radiculopathy (HCC), Foraminal stenosis of cervical region (right), Cervical radicular pain, History of lumbar fusion, Chronic radicular lumbar pain, and Chronic pain syndrome were also pertinent to this visit.  Visit Diagnosis (New problems to examiner): 1.  Chronic radicular cervical pain (right)   2. Cervical myelopathy with cervical radiculopathy (HCC)   3. Foraminal stenosis of cervical region (right)   4. Cervical radicular pain   5. History of lumbar fusion   6. Chronic radicular lumbar pain   7. Chronic pain syndrome    I had extensive discussion with Digby as well as his wife regarding his MRI.  Patient's clinical history, physical exam and cervical MRI findings consistent with cervical myelopathy and cervical radiculopathy as patient has C3-C4 and C4-C5 cord flattening due to disc herniations with associated high-grade foraminal impingement on the right at C3-C4 and bilaterally at C4-C5 and C5-C6.  Recommend cervical epidural steroid injection with p.o. Valium, referral to neurosurgery: Dr. Cari Caraway.  I also recommend patient start Lyrica as below for neuropathic pain and follow instructions for steroid taper.  Plan of Care (Initial workup plan)   Referral Orders     Ambulatory referral to Neurosurgery  Procedure Orders     Cervical Epidural Injection  Requested Prescriptions   Signed Prescriptions Disp Refills  . pregabalin (LYRICA) 50 MG capsule 100 capsule 0    Sig: Take 1 capsule (50 mg total) by mouth at bedtime for 20 days, THEN 1 capsule (50 mg total) 2 (two) times daily.  . methylPREDNISolone (MEDROL)  4 MG TBPK tablet 21 tablet 0    Sig: Follow package instructions.    Orders Placed This Encounter  Procedures  . Cervical Epidural Injection    Level(s): C7-T1 Laterality: TBD Purpose: Diagnostic/Therapeutic Indication(s): Radiculitis and cervicalgia associater with cervical degenerative disc disease.    Standing Status:   Future    Standing Expiration Date:   06/01/2020    Scheduling Instructions:     Procedure: Cervical Epidural Steroid Injection/Block (RIGHT)     Sedation: Patient's choice.     Timeframe: As soon as schedule allows    Order Specific Question:   Where will this procedure be performed?    Answer:   [facility]    Comments:   Sherard Sutch  . Ambulatory referral to Neurosurgery    Referral Priority:   Routine    Referral Type:   Surgical    Referral Reason:   Specialty Services Required    Referred to Provider:   Meade Maw, MD    Requested Specialty:   Neurosurgery    Number of Visits Requested:   1      Interventional management options: Mr. Demetriou was informed that there is no guarantee that he would be a candidate for interventional therapies. The decision will be based on the results of diagnostic studies, as well as Mr. Mcnutt risk profile.  Procedure(s) under consideration:  C-ESI   Provider-requested follow-up: Return in about 2 weeks (around 05/15/2020) for right C7/T1 ESI with PO Valium .  Future Appointments  Date Time Provider [place]  05/17/2020 10:00 AM Gillis Santa, MD [facility] None    Note by: Gillis Santa, MD Date: 05/01/2020; Time: 4:08 PM

## 2020-05-01 NOTE — Telephone Encounter (Signed)
I return Pt call, LVM to call us back next week on Tuesday since I have nor more appt available at this time

## 2020-05-01 NOTE — Patient Instructions (Signed)
____________________________________________________________________________________________  General Risks and Possible Complications  Patient Responsibilities: It is important that you read this as it is part of your informed consent. It is our duty to inform you of the risks and possible complications associated with treatments offered to you. It is your responsibility as a patient to read this and to ask questions about anything that is not clear or that you believe was not covered in this document.  Patient's Rights: You have the right to refuse treatment. You also have the right to change your mind, even after initially having agreed to have the treatment done. However, under this last option, if you wait until the last second to change your mind, you may be charged for the materials used up to that point.  Introduction: Medicine is not an exact science. Everything in Medicine, including the lack of treatment(s), carries the potential for danger, harm, or loss (which is by definition: Risk). In Medicine, a complication is a secondary problem, condition, or disease that can aggravate an already existing one. All treatments carry the risk of possible complications. The fact that a side effects or complications occurs, does not imply that the treatment was conducted incorrectly. It must be clearly understood that these can happen even when everything is done following the highest safety standards.  No treatment: You can choose not to proceed with the proposed treatment alternative. The "PRO(s)" would include: avoiding the risk of complications associated with the therapy. The "CON(s)" would include: not getting any of the treatment benefits. These benefits fall under one of three categories: diagnostic; therapeutic; and/or palliative. Diagnostic benefits include: getting information which can ultimately lead to improvement of the disease or symptom(s). Therapeutic benefits are those associated with the  successful treatment of the disease. Finally, palliative benefits are those related to the decrease of the primary symptoms, without necessarily curing the condition (example: decreasing the pain from a flare-up of a chronic condition, such as incurable terminal cancer).  General Risks and Complications: These are associated to most interventional treatments. They can occur alone, or in combination. They fall under one of the following six (6) categories: no benefit or worsening of symptoms; bleeding; infection; nerve damage; allergic reactions; and/or death. 1. No benefits or worsening of symptoms: In Medicine there are no guarantees, only probabilities. No healthcare provider can ever guarantee that a medical treatment will work, they can only state the probability that it may. Furthermore, there is always the possibility that the condition may worsen, either directly, or indirectly, as a consequence of the treatment. 2. Bleeding: This is more common if the patient is taking a blood thinner, either prescription or over the counter (example: Goody Powders, Fish oil, Aspirin, Garlic, etc.), or if suffering a condition associated with impaired coagulation (example: Hemophilia, cirrhosis of the liver, low platelet counts, etc.). However, even if you do not have one on these, it can still happen. If you have any of these conditions, or take one of these drugs, make sure to notify your treating physician. 3. Infection: This is more common in patients with a compromised immune system, either due to disease (example: diabetes, cancer, human immunodeficiency virus [HIV], etc.), or due to medications or treatments (example: therapies used to treat cancer and rheumatological diseases). However, even if you do not have one on these, it can still happen. If you have any of these conditions, or take one of these drugs, make sure to notify your treating physician. 4. Nerve Damage: This is more common when the   treatment is  an invasive one, but it can also happen with the use of medications, such as those used in the treatment of cancer. The damage can occur to small secondary nerves, or to large primary ones, such as those in the spinal cord and brain. This damage may be temporary or permanent and it may lead to impairments that can range from temporary numbness to permanent paralysis and/or brain death. 5. Allergic Reactions: Any time a substance or material comes in contact with our body, there is the possibility of an allergic reaction. These can range from a mild skin rash (contact dermatitis) to a severe systemic reaction (anaphylactic reaction), which can result in death. 6. Death: In general, any medical intervention can result in death, most of the time due to an unforeseen complication. ____________________________________________________________________________________________  Epidural Steroid Injection Patient Information  Description: The epidural space surrounds the nerves as they exit the spinal cord.  In some patients, the nerves can be compressed and inflamed by a bulging disc or a tight spinal canal (spinal stenosis).  By injecting steroids into the epidural space, we can bring irritated nerves into direct contact with a potentially helpful medication.  These steroids act directly on the irritated nerves and can reduce swelling and inflammation which often leads to decreased pain.  Epidural steroids may be injected anywhere along the spine and from the neck to the low back depending upon the location of your pain.   After numbing the skin with local anesthetic (like Novocaine), a small needle is passed into the epidural space slowly.  You may experience a sensation of pressure while this is being done.  The entire block usually last less than 10 minutes.  Conditions which may be treated by epidural steroids:   Low back and leg pain  Neck and arm pain  Spinal stenosis  Post-laminectomy  syndrome  Herpes zoster (shingles) pain  Pain from compression fractures  Preparation for the injection:  1. Do not eat any solid food or dairy products within 8 hours of your appointment.  2. You may drink clear liquids up to 3 hours before appointment.  Clear liquids include water, black coffee, juice or soda.  No milk or cream please. 3. You may take your regular medication, including pain medications, with a sip of water before your appointment  Diabetics should hold regular insulin (if taken separately) and take 1/2 normal NPH dos the morning of the procedure.  Carry some sugar containing items with you to your appointment. 4. A driver must accompany you and be prepared to drive you home after your procedure.  5. Bring all your current medications with your. 6. An IV may be inserted and sedation may be given at the discretion of the physician.   7. A blood pressure cuff, EKG and other monitors will often be applied during the procedure.  Some patients may need to have extra oxygen administered for a short period. 8. You will be asked to provide medical information, including your allergies, prior to the procedure.  We must know immediately if you are taking blood thinners (like Coumadin/Warfarin)  Or if you are allergic to IV iodine contrast (dye). We must know if you could possible be pregnant.  Possible side-effects:  Bleeding from needle site  Infection (rare, may require surgery)  Nerve injury (rare)  Numbness & tingling (temporary)  Difficulty urinating (rare, temporary)  Spinal headache ( a headache worse with upright posture)  Light -headedness (temporary)  Pain at injection site (several   days)  Decreased blood pressure (temporary)  Weakness in arm/leg (temporary)  Pressure sensation in back/neck (temporary)  Call if you experience:  Fever/chills associated with headache or increased back/neck pain.  Headache worsened by an upright position.  New onset  weakness or numbness of an extremity below the injection site  Hives or difficulty breathing (go to the emergency room)  Inflammation or drainage at the infection site  Severe back/neck pain  Any new symptoms which are concerning to you  Please note:  Although the local anesthetic injected can often make your back or neck feel good for several hours after the injection, the pain will likely return.  It takes 3-7 days for steroids to work in the epidural space.  You may not notice any pain relief for at least that one week.  If effective, we will often do a series of three injections spaced 3-6 weeks apart to maximally decrease your pain.  After the initial series, we generally will wait several months before considering a repeat injection of the same type.  If you have any questions, please call [phone] [facility] 

## 2020-05-01 NOTE — Telephone Encounter (Signed)
Copied from CRM [id number]. Topic: General - Other >> May 01, 2020  3:14 PM Laural Benes, [place] C wrote: Reason for CRM: pt is calling in to request a call back from [place]. Pt says that he would like to have financial assistance.    CB: [phone]

## 2020-05-01 NOTE — Progress Notes (Signed)
Safety precautions to be maintained throughout the outpatient stay will include: orient to surroundings, keep bed in low position, maintain call bell within reach at all times, provide assistance with transfer out of bed and ambulation.  

## 2020-05-02 MED FILL — PREGABALIN 50 MG CAPS: 50 | 30 days supply | Qty: 40 | Fill #0

## 2020-05-17 ENCOUNTER — Encounter: Payer: Self-pay | Admitting: Student in an Organized Health Care Education/Training Program

## 2020-05-17 ENCOUNTER — Ambulatory Visit
Admission: RE | Admit: 2020-05-17 | Discharge: 2020-05-17 | Disposition: A | Discharge status: Home / Self Care | Payer: Medicaid Other | Source: Ambulatory Visit | Attending: Student in an Organized Health Care Education/Training Program | Admitting: Student in an Organized Health Care Education/Training Program

## 2020-05-17 ENCOUNTER — Ambulatory Visit (HOSPITAL_BASED_OUTPATIENT_CLINIC_OR_DEPARTMENT_OTHER): Payer: Self-pay | Admitting: Student in an Organized Health Care Education/Training Program

## 2020-05-17 ENCOUNTER — Other Ambulatory Visit: Payer: Self-pay

## 2020-05-17 VITALS — BP 130/96 | HR 105 | Temp 97.2°F | Resp 16 | Ht 66.0 in | Wt 185.0 lb

## 2020-05-17 DIAGNOSIS — G894 Chronic pain syndrome: Secondary | ICD-10-CM | POA: Insufficient documentation

## 2020-05-17 DIAGNOSIS — M4802 Spinal stenosis, cervical region: Secondary | ICD-10-CM | POA: Insufficient documentation

## 2020-05-17 DIAGNOSIS — G959 Disease of spinal cord, unspecified: Secondary | ICD-10-CM | POA: Insufficient documentation

## 2020-05-17 DIAGNOSIS — M5412 Radiculopathy, cervical region: Secondary | ICD-10-CM | POA: Insufficient documentation

## 2020-05-17 MED ORDER — DIAZEPAM 5 MG PO TABS
10.0000 mg | ORAL_TABLET | Freq: Once | ORAL | Status: AC
  Administered 2020-05-17: 10 mg via ORAL

## 2020-05-17 MED ORDER — SODIUM CHLORIDE 0.9% FLUSH
1.0000 mL | Freq: Once | INTRAVENOUS | Status: AC
  Administered 2020-05-17: 1 mL

## 2020-05-17 MED ORDER — SODIUM CHLORIDE (PF) 0.9 % IJ SOLN
INTRAMUSCULAR | Status: AC
  Filled 2020-05-17: qty 10

## 2020-05-17 MED ORDER — DIAZEPAM 5 MG PO TABS
ORAL_TABLET | ORAL | Status: AC
  Filled 2020-05-17: qty 2

## 2020-05-17 MED ORDER — LIDOCAINE HCL 2 % IJ SOLN
20.0000 mL | Freq: Once | INTRAMUSCULAR | Status: AC
  Administered 2020-05-17: 200 mg

## 2020-05-17 MED ORDER — DEXAMETHASONE SODIUM PHOSPHATE 10 MG/ML IJ SOLN
10.0000 mg | Freq: Once | INTRAMUSCULAR | Status: AC
  Administered 2020-05-17: 10 mg

## 2020-05-17 MED ORDER — DEXAMETHASONE SODIUM PHOSPHATE 10 MG/ML IJ SOLN
INTRAMUSCULAR | Status: AC
  Filled 2020-05-17: qty 1

## 2020-05-17 MED ORDER — LIDOCAINE HCL URETHRAL/MUCOSAL 2 % EX GEL
CUTANEOUS | Status: AC
  Filled 2020-05-17: qty 5

## 2020-05-17 MED ORDER — ROPIVACAINE HCL 2 MG/ML IJ SOLN
INTRAMUSCULAR | Status: AC
  Filled 2020-05-17: qty 10

## 2020-05-17 MED ORDER — ROPIVACAINE HCL 2 MG/ML IJ SOLN
1.0000 mL | Freq: Once | INTRAMUSCULAR | Status: AC
  Administered 2020-05-17: 1 mL via EPIDURAL

## 2020-05-17 MED ORDER — IOHEXOL 180 MG/ML  SOLN
10.0000 mL | Freq: Once | INTRAMUSCULAR | Status: AC
  Administered 2020-05-17: 10 mL via EPIDURAL

## 2020-05-17 NOTE — Progress Notes (Signed)
Safety precautions to be maintained throughout the outpatient stay will include: orient to surroundings, keep bed in low position, maintain call bell within reach at all times, provide assistance with transfer out of bed and ambulation.  

## 2020-05-17 NOTE — Progress Notes (Signed)
PROVIDER NOTE: Information contained herein reflects review and annotations entered in association with encounter. Interpretation of such information and data should be left to medically-trained personnel. Information provided to patient can be located elsewhere in the medical record under "Patient Instructions". Document created using STT-dictation technology, any transcriptional errors that may result from process are unintentional.    Patient: Scott Tucker  Service Category: Procedure  Provider: Edward Jolly, MD  DOB: Aug 24, 1965  DOS: 05/17/2020  Location: [facility]  MRN: [id number]  Setting: Ambulatory - outpatient  Referring Provider: Grayce Sessions, NP  Type: Established Patient  Specialty: Interventional Pain Management  PCP: Grayce Sessions, NP   Primary Reason for Visit: Interventional Pain Management Treatment. CC: neck and right arm pain  Procedure:          Anesthesia, Analgesia, Anxiolysis:  Type: Diagnostic, Inter-Laminar, Cervical Epidural Steroid Injection  #1  Region: Posterior Cervico-thoracic Region Level: C7-T1 Laterality: Right-Sided Paramedial  Type: Local Anesthesia with p.o. Valium Indication(s): Analgesia and Anxiety  Local Anesthetic: Lidocaine 1-2%  Position: Prone with head of the table was raised to facilitate breathing.   Indications: 1. Cervical radicular pain   2. Foraminal stenosis of cervical region (right)   3. Cervical myelopathy with cervical radiculopathy (HCC)   4. Chronic pain syndrome    Pain Score: Pre-procedure: 8 /10 Post-procedure: 8 /10    4 out of 5 strength right upper extremity: Shoulder abduction, elbow flexion, elbow extension, thumb extension. 5 out of 5 strength left upper extremity: Shoulder abduction, elbow flexion, elbow extension, thumb extension.  Pre-op H&P Assessment:  Scott Tucker is a 55 y.o. (year old), male patient, seen today for interventional treatment. He  has a past surgical history  that includes Back surgery and Shoulder surgery. Scott Tucker has a current medication list which includes the following prescription(s): amlodipine, aspirin, atorvastatin, fluoxetine, hydroxyzine, losartan, nitroglycerin, pregabalin, benzonatate, and cyclobenzaprine. His primarily concern today is the No chief complaint on file.  Initial Vital Signs:  Pulse/HCG Rate: (!) 105ECG Heart Rate: 91 Temp: (!) 97.2 F (36.2 C) Resp: 18 BP: (!) 136/96 SpO2: 96 %  BMI: Estimated body mass index is 29.86 kg/m as calculated from the following:   Height as of this encounter: 5\' 6"  (1.676 m).   Weight as of this encounter: 185 lb (83.9 kg).  Risk Assessment: Allergies: Reviewed. He is allergic to latex.  Allergy Precautions: None required Coagulopathies: Reviewed. None identified.  Blood-thinner therapy: None at this time Active Infection(s): Reviewed. None identified. Scott Tucker is afebrile  Site Confirmation: Scott Tucker was asked to confirm the procedure and laterality before marking the site Procedure checklist: Completed Consent: Before the procedure and under the influence of no sedative(s), amnesic(s), or anxiolytics, the patient was informed of the treatment options, risks and possible complications. To fulfill our ethical and legal obligations, as recommended by the American Medical Association's Code of Ethics, I have informed the patient of my clinical impression; the nature and purpose of the treatment or procedure; the risks, benefits, and possible complications of the intervention; the alternatives, including doing nothing; the risk(s) and benefit(s) of the alternative treatment(s) or procedure(s); and the risk(s) and benefit(s) of doing nothing. The patient was provided information about the general risks and possible complications associated with the procedure. These may include, but are not limited to: failure to achieve desired goals, infection, bleeding, organ or nerve damage, allergic  reactions, paralysis, and death. In addition, the patient was informed of those risks and  complications associated to Spine-related procedures, such as failure to decrease pain; infection (i.e.: Meningitis, epidural or intraspinal abscess); bleeding (i.e.: epidural hematoma, subarachnoid hemorrhage, or any other type of intraspinal or peri-dural bleeding); organ or nerve damage (i.e.: Any type of peripheral nerve, nerve root, or spinal cord injury) with subsequent damage to sensory, motor, and/or autonomic systems, resulting in permanent pain, numbness, and/or weakness of one or several areas of the body; allergic reactions; (i.e.: anaphylactic reaction); and/or death. Furthermore, the patient was informed of those risks and complications associated with the medications. These include, but are not limited to: allergic reactions (i.e.: anaphylactic or anaphylactoid reaction(s)); adrenal axis suppression; blood sugar elevation that in diabetics may result in ketoacidosis or comma; water retention that in patients with history of congestive heart failure may result in shortness of breath, pulmonary edema, and decompensation with resultant heart failure; weight gain; swelling or edema; medication-induced neural toxicity; particulate matter embolism and blood vessel occlusion with resultant organ, and/or nervous system infarction; and/or aseptic necrosis of one or more joints. Finally, the patient was informed that Medicine is not an exact science; therefore, there is also the possibility of unforeseen or unpredictable risks and/or possible complications that may result in a catastrophic outcome. The patient indicated having understood very clearly. We have given the patient no guarantees and we have made no promises. Enough time was given to the patient to ask questions, all of which were answered to the patient's satisfaction. Scott Tucker has indicated that he wanted to continue with the procedure. Attestation: I,  the ordering provider, attest that I have discussed with the patient the benefits, risks, side-effects, alternatives, likelihood of achieving goals, and potential problems during recovery for the procedure that I have provided informed consent. Date  Time: 05/17/2020  9:26 AM  Pre-Procedure Preparation:  Monitoring: As per clinic protocol. Respiration, ETCO2, SpO2, BP, heart rate and rhythm monitor placed and checked for adequate function Safety Precautions: Patient was assessed for positional comfort and pressure points before starting the procedure. Time-out: I initiated and conducted the "Time-out" before starting the procedure, as per protocol. The patient was asked to participate by confirming the accuracy of the "Time Out" information. Verification of the correct person, site, and procedure were performed and confirmed by me, the nursing staff, and the patient. "Time-out" conducted as per Joint Commission's Universal Protocol (UP.01.01.01). Time: 1011  Description of Procedure:          Target Area: For Epidural Steroid injections the target is the interlaminar space, initially targeting the lower border of the superior vertebral body lamina. Approach: Paramedial approach. Area Prepped: Entire PosteriorCervical Region DuraPrep (Iodine Povacrylex [0.7% available iodine] and Isopropyl Alcohol, 74% w/w) Safety Precautions: Aspiration looking for blood return was conducted prior to all injections. At no point did we inject any substances, as a needle was being advanced. No attempts were made at seeking any paresthesias. Safe injection practices and needle disposal techniques used. Medications properly checked for expiration dates. SDV (single dose vial) medications used. Description of the Procedure: Protocol guidelines were followed. The procedure needle was introduced through the skin, ipsilateral to the reported pain, and advanced to the target area. Bone was contacted and the needle walked  caudad, until the lamina was cleared. The epidural space was identified using "loss-of-resistance technique" with 2-3 ml of PF-NaCl (0.9% NSS), in a 5cc LOR glass syringe. Vitals:   05/17/20 0937 05/17/20 1010 05/17/20 1015  BP: (!) 136/96 (!) 136/95 (!) 122/94  Pulse: (!) 105  Resp: 18 16 18   Temp: (!) 97.2 F (36.2 C)    TempSrc: Temporal    SpO2: 96% 98% 96%  Weight: 185 lb (83.9 kg)    Height: 5\' 6"  (1.676 m)      Start Time: 1011 hrs. End Time: 1018 hrs. Materials:  Needle(s) Type: Epidural needle Gauge: 22G Length: 3.5-in Medication(s): Please see orders for medications and dosing details. 4 cc solution made o f2 cc of preservative-free saline, 1 cc of 0.2% ropivacaine, 1 cc of Decadron 10 mg/cc.  Imaging Guidance (Spinal):          Type of Imaging Technique: Fluoroscopy Guidance (Spinal) Indication(s): Assistance in needle guidance and placement for procedures requiring needle placement in or near specific anatomical locations not easily accessible without such assistance. Exposure Time: Please see nurses notes. Contrast: Before injecting any contrast, we confirmed that the patient did not have an allergy to iodine, shellfish, or radiological contrast. Once satisfactory needle placement was completed at the desired level, radiological contrast was injected. Contrast injected under live fluoroscopy. No contrast complications. See chart for type and volume of contrast used. Fluoroscopic Guidance: I was personally present during the use of fluoroscopy. "Tunnel Vision Technique" used to obtain the best possible view of the target area. Parallax error corrected before commencing the procedure. "Direction-depth-direction" technique used to introduce the needle under continuous pulsed fluoroscopy. Once target was reached, antero-posterior, oblique, and lateral fluoroscopic projection used confirm needle placement in all planes. Images permanently stored in EMR. Interpretation: I  personally interpreted the imaging intraoperatively. Adequate needle placement confirmed in multiple planes. Appropriate spread of contrast into desired area was observed. No evidence of afferent or efferent intravascular uptake. No intrathecal or subarachnoid spread observed. Permanent images saved into the patient's record.  Antibiotic Prophylaxis:   Anti-infectives (From admission, onward)   None     Indication(s): None identified  Post-operative Assessment:  Post-procedure Vital Signs:  Pulse/HCG Rate: (!) 10590 Temp: (!) 97.2 F (36.2 C) Resp: 18 BP: (!) 122/94 SpO2: 96 %  EBL: None  Complications: No immediate post-treatment complications observed by team, or reported by patient.  Note: The patient tolerated the entire procedure well. A repeat set of vitals were taken after the procedure and the patient was kept under observation following institutional policy, for this type of procedure. Post-procedural neurological assessment was performed, showing return to baseline, prior to discharge. The patient was provided with post-procedure discharge instructions, including a section on how to identify potential problems. Should any problems arise concerning this procedure, the patient was given instructions to immediately contact us, at any time, without hesitation. In any case, we plan to contact the patient by telephone for a follow-up status report regarding this interventional procedure.  Comments:  No additional relevant information.  4 out of 5 strength right upper extremity: Shoulder abduction, elbow flexion, elbow extension, thumb extension. 5 out of 5 strength left upper extremity: Shoulder abduction, elbow flexion, elbow extension, thumb extension.  Plan of Care  Orders:  Orders Placed This Encounter  Procedures  . DG PAIN CLINIC C-ARM 1-60 MIN NO REPORT    Intraoperative interpretation by procedural physician at [place].    Standing Status:   Standing     Number of Occurrences:   1    Order Specific Question:   Reason for exam:    Answer:   Assistance in needle guidance and placement for procedures requiring needle placement in or near specific anatomical locations not easily accessible without such assistance.  Medications ordered for procedure: Meds ordered this encounter  Medications  . iohexol (OMNIPAQUE) 180 MG/ML injection 10 mL    Must be Myelogram-compatible. If not available, you may substitute with a water-soluble, non-ionic, hypoallergenic, myelogram-compatible radiological contrast medium.  Marland Kitchen lidocaine (XYLOCAINE) 2 % (with pres) injection 400 mg  . ropivacaine (PF) 2 mg/mL (0.2%) (NAROPIN) injection 1 mL  . sodium chloride flush (NS) 0.9 % injection 1 mL  . dexamethasone (DECADRON) injection 10 mg  . diazepam (VALIUM) tablet 10 mg   Medications administered: We administered iohexol, lidocaine, ropivacaine (PF) 2 mg/mL (0.2%), sodium chloride flush, dexamethasone, and diazepam.  See the medical record for exact dosing, route, and time of administration.  Follow-up plan:   Return in about 4 weeks (around 06/14/2020) for Post Procedure Evaluation, virtual.      Right C7-T1 ESI 05/17/2020   Recent Visits Date Type Provider Dept  05/01/20 Office Visit Edward Jolly, MD [facility]  Showing recent visits within past 90 days and meeting all other requirements Today's Visits Date Type Provider Dept  05/17/20 Procedure visit Edward Jolly, MD [facility]  Showing today's visits and meeting all other requirements Future Appointments Date Type Provider Dept  06/13/20 Appointment Edward Jolly, MD [facility]  Showing future appointments within next 90 days and meeting all other requirements  Disposition: Discharge home  Discharge (Date  Time): 05/17/2020; 1030 hrs.   Primary Care Physician: Grayce Sessions, NP Location: [facility] Note by: Edward Jolly, MD Date:  05/17/2020; Time: 10:32 AM  Disclaimer:  Medicine is not an exact science. The only guarantee in medicine is that nothing is guaranteed. It is important to note that the decision to proceed with this intervention was based on the information collected from the patient. The Data and conclusions were drawn from the patient's questionnaire, the interview, and the physical examination. Because the information was provided in large part by the patient, it cannot be guaranteed that it has not been purposely or unconsciously manipulated. Every effort has been made to obtain as much relevant data as possible for this evaluation. It is important to note that the conclusions that lead to this procedure are derived in large part from the available data. Always take into account that the treatment will also be dependent on availability of resources and existing treatment guidelines, considered by other Pain Management Practitioners as being common knowledge and practice, at the time of the intervention. For Medico-Legal purposes, it is also important to point out that variation in procedural techniques and pharmacological choices are the acceptable norm. The indications, contraindications, technique, and results of the above procedure should only be interpreted and judged by a Board-Certified Interventional Pain Specialist with extensive familiarity and expertise in the same exact procedure and technique.

## 2020-05-17 NOTE — Patient Instructions (Signed)
Pain Management Discharge Instructions  General Discharge Instructions :  If you need to reach your doctor call: Monday-Friday 8:00 am - 4:00 pm at [phone] or toll free [phone].  After clinic hours [phone] to have operator reach doctor.  Bring all of your medication bottles to all your appointments in the pain clinic.  To cancel or reschedule your appointment with Pain Management please remember to call 24 hours in advance to avoid a fee.  Refer to the educational materials which you have been given on: General Risks, I had my Procedure. Discharge Instructions, Post Sedation.  Post Procedure Instructions:  The drugs you were given will stay in your system until tomorrow, so for the next 24 hours you should not drive, make any legal decisions or drink any alcoholic beverages.  You may eat anything you prefer, but it is better to start with liquids then soups and crackers, and gradually work up to solid foods.  Please notify your doctor immediately if you have any unusual bleeding, trouble breathing or pain that is not related to your normal pain.  Depending on the type of procedure that was done, some parts of your body may feel week and/or numb.  This usually clears up by tonight or the next day.  Walk with the use of an assistive device or accompanied by an adult for the 24 hours.  You may use ice on the affected area for the first 24 hours.  Put ice in a Ziploc bag and cover with a towel and place against area 15 minutes on 15 minutes off.  You may switch to heat after 24 hours.Epidural Steroid Injection Patient Information  Description: The epidural space surrounds the nerves as they exit the spinal cord.  In some patients, the nerves can be compressed and inflamed by a bulging disc or a tight spinal canal (spinal stenosis).  By injecting steroids into the epidural space, we can bring irritated nerves into direct contact with a potentially helpful medication.  These  steroids act directly on the irritated nerves and can reduce swelling and inflammation which often leads to decreased pain.  Epidural steroids may be injected anywhere along the spine and from the neck to the low back depending upon the location of your pain.   After numbing the skin with local anesthetic (like Novocaine), a small needle is passed into the epidural space slowly.  You may experience a sensation of pressure while this is being done.  The entire block usually last less than 10 minutes.  Conditions which may be treated by epidural steroids:   Low back and leg pain  Neck and arm pain  Spinal stenosis  Post-laminectomy syndrome  Herpes zoster (shingles) pain  Pain from compression fractures  Preparation for the injection:  1. Do not eat any solid food or dairy products within 8 hours of your appointment.  2. You may drink clear liquids up to 3 hours before appointment.  Clear liquids include water, black coffee, juice or soda.  No milk or cream please. 3. You may take your regular medication, including pain medications, with a sip of water before your appointment  Diabetics should hold regular insulin (if taken separately) and take 1/2 normal NPH dos the morning of the procedure.  Carry some sugar containing items with you to your appointment. 4. A driver must accompany you and be prepared to drive you home after your procedure.  5. Bring all your current medications with your. 6. An IV may be inserted and   sedation may be given at the discretion of the physician.   7. A blood pressure cuff, EKG and other monitors will often be applied during the procedure.  Some patients may need to have extra oxygen administered for a short period. 8. You will be asked to provide medical information, including your allergies, prior to the procedure.  We must know immediately if you are taking blood thinners (like Coumadin/Warfarin)  Or if you are allergic to IV iodine contrast (dye). We must  know if you could possible be pregnant.  Possible side-effects:  Bleeding from needle site  Infection (rare, may require surgery)  Nerve injury (rare)  Numbness & tingling (temporary)  Difficulty urinating (rare, temporary)  Spinal headache ( a headache worse with upright posture)  Light -headedness (temporary)  Pain at injection site (several days)  Decreased blood pressure (temporary)  Weakness in arm/leg (temporary)  Pressure sensation in back/neck (temporary)  Call if you experience:  Fever/chills associated with headache or increased back/neck pain.  Headache worsened by an upright position.  New onset weakness or numbness of an extremity below the injection site  Hives or difficulty breathing (go to the emergency room)  Inflammation or drainage at the infection site  Severe back/neck pain  Any new symptoms which are concerning to you  Please note:  Although the local anesthetic injected can often make your back or neck feel good for several hours after the injection, the pain will likely return.  It takes 3-7 days for steroids to work in the epidural space.  You may not notice any pain relief for at least that one week.  If effective, we will often do a series of three injections spaced 3-6 weeks apart to maximally decrease your pain.  After the initial series, we generally will wait several months before considering a repeat injection of the same type.  If you have any questions, please call [phone] [facility] 

## 2020-05-18 ENCOUNTER — Telehealth: Payer: Self-pay | Admitting: *Deleted

## 2020-05-18 NOTE — Telephone Encounter (Signed)
Called for post procedure check. Denies any issues. 

## 2020-05-24 ENCOUNTER — Ambulatory Visit: Payer: Medicaid Other

## 2020-05-29 ENCOUNTER — Ambulatory Visit: Payer: Medicaid Other

## 2020-05-31 ENCOUNTER — Other Ambulatory Visit: Payer: Self-pay | Admitting: Orthopaedic Surgery

## 2020-05-31 ENCOUNTER — Other Ambulatory Visit (INDEPENDENT_AMBULATORY_CARE_PROVIDER_SITE_OTHER): Payer: Self-pay | Admitting: Primary Care

## 2020-05-31 MED FILL — ?ATORVASTATIN 40MG TABLET: 40 | 30 days supply | Qty: 30 | Fill #3

## 2020-05-31 MED FILL — LOSARTAN POTASSIUM 25 MG TA: 25 | 30 days supply | Qty: 30 | Fill #3

## 2020-05-31 MED FILL — hydrOXYzine HCL 25 MG TABS: 25 | 30 days supply | Qty: 30 | Fill #0

## 2020-05-31 MED FILL — AMLODIPINE BESYLATE 10 MG T: 10 | 30 days supply | Qty: 30 | Fill #3

## 2020-05-31 NOTE — Telephone Encounter (Signed)
Please advise 

## 2020-06-02 ENCOUNTER — Encounter: Payer: Self-pay | Admitting: Student in an Organized Health Care Education/Training Program

## 2020-06-05 ENCOUNTER — Ambulatory Visit: Payer: Self-pay

## 2020-06-06 NOTE — Telephone Encounter (Signed)
Alona Bene please check this

## 2020-06-12 ENCOUNTER — Other Ambulatory Visit: Payer: Self-pay | Admitting: Primary Care

## 2020-06-13 ENCOUNTER — Ambulatory Visit
Payer: Medicaid Other | Attending: Student in an Organized Health Care Education/Training Program | Admitting: Student in an Organized Health Care Education/Training Program

## 2020-06-13 ENCOUNTER — Other Ambulatory Visit: Payer: Self-pay

## 2020-06-13 ENCOUNTER — Encounter: Payer: Self-pay | Admitting: Student in an Organized Health Care Education/Training Program

## 2020-06-13 ENCOUNTER — Other Ambulatory Visit: Payer: Self-pay | Admitting: Student in an Organized Health Care Education/Training Program

## 2020-06-13 VITALS — BP 140/98 | HR 99 | Temp 97.1°F | Resp 18 | Ht 66.0 in | Wt 185.0 lb

## 2020-06-13 DIAGNOSIS — G8929 Other chronic pain: Secondary | ICD-10-CM | POA: Insufficient documentation

## 2020-06-13 DIAGNOSIS — M4802 Spinal stenosis, cervical region: Secondary | ICD-10-CM | POA: Insufficient documentation

## 2020-06-13 DIAGNOSIS — Z981 Arthrodesis status: Secondary | ICD-10-CM | POA: Insufficient documentation

## 2020-06-13 DIAGNOSIS — M5412 Radiculopathy, cervical region: Secondary | ICD-10-CM | POA: Insufficient documentation

## 2020-06-13 DIAGNOSIS — G894 Chronic pain syndrome: Secondary | ICD-10-CM | POA: Insufficient documentation

## 2020-06-13 DIAGNOSIS — M5136 Other intervertebral disc degeneration, lumbar region: Secondary | ICD-10-CM | POA: Insufficient documentation

## 2020-06-13 DIAGNOSIS — G959 Disease of spinal cord, unspecified: Secondary | ICD-10-CM | POA: Insufficient documentation

## 2020-06-13 MED ORDER — PREGABALIN 75 MG PO CAPS: 75.0000 mg | ORAL_CAPSULE | Freq: Two times a day (BID) | ORAL | 1 refills | Status: DC

## 2020-06-13 MED ORDER — METHOCARBAMOL 750 MG PO TABS: 750.0000 mg | ORAL_TABLET | Freq: Two times a day (BID) | ORAL | 1 refills | Status: DC | PRN

## 2020-06-13 MED FILL — PREGABALIN 75 MG CAPS: 75 | 30 days supply | Qty: 60 | Fill #0

## 2020-06-13 MED FILL — METHOCARBAMOL 750 MG TABS: 750 | 30 days supply | Qty: 60 | Fill #0

## 2020-06-13 NOTE — Patient Instructions (Addendum)
Robaxin and Lyrica have been escribed to your pharmacy.  You have been referred to neurosurgery.  You will have MRI completed prior to next appt. With pain clinic.   Moderate Conscious Sedation, Adult Sedation is the use of medicines to promote relaxation and to relieve discomfort and anxiety. Moderate conscious sedation is a type of sedation. Under moderate conscious sedation, you are less alert than normal, but you are still able to respond to instructions, touch, or both. Moderate conscious sedation is used during short medical and dental procedures. It is milder than deep sedation, which is a type of sedation under which you cannot be easily woken up. It is also milder than general anesthesia, which is the use of medicines to make you unconscious. Moderate conscious sedation allows you to return to your regular activities sooner. Tell a health care provider about:  Any allergies you have.  All medicines you are taking, including vitamins, herbs, eye drops, creams, and over-the-counter medicines.  Any use of steroids. This includes steroids taken by mouth or as a cream.  Any problems you or family members have had with sedatives and anesthetic medicines.  Any blood disorders you have.  Any surgeries you have had.  Any medical conditions you have, such as sleep apnea.  Whether you are pregnant or may be pregnant.  Any use of cigarettes, alcohol, marijuana, or drugs. What are the risks? Generally, this is a safe procedure. However, problems may occur, including:  Getting too much medicine (oversedation).  Nausea.  Allergic reaction to medicines.  Trouble breathing. If this happens, a breathing tube may be used. It will be removed when you are awake and breathing on your own.  Heart trouble.  Lung trouble.  Confusion that gets better with time (emergence delirium). What happens before the procedure? Staying hydrated Follow instructions from your health care provider  about hydration, which may include:  Up to 2 hours before the procedure - you may continue to drink clear liquids, such as water, clear fruit juice, black coffee, and plain tea. Eating and drinking restrictions Follow instructions from your health care provider about eating and drinking, which may include:  8 hours before the procedure - stop eating heavy meals or foods, such as meat, fried foods, or fatty foods.  6 hours before the procedure - stop eating light meals or foods, such as toast or cereal.  6 hours before the procedure - stop drinking milk or drinks that contain milk.  2 hours before the procedure - stop drinking clear liquids. Medicines Ask your health care provider about:  Changing or stopping your regular medicines. This is especially important if you are taking diabetes medicines or blood thinners.  Taking medicines such as aspirin and ibuprofen. These medicines can thin your blood. Do not take these medicines unless your health care provider tells you to take them.  Taking over-the-counter medicines, vitamins, herbs, and supplements. Tests and exams  You will have a physical exam.  You may have blood tests done to show how well: ? Your kidneys and liver work. ? Your blood clots. General instructions  Plan to have a responsible adult take you home from the hospital or clinic.  If you will be going home right after the procedure, plan to have a responsible adult care for you for the time you are told. This is important. What happens during the procedure?  You will be given the sedative. The sedative may be given: ? As a pill that you will swallow. It  can also be inserted into the rectum. ? As a spray through the nose. ? As an injection into the muscle. ? As an injection into the vein through an IV.  You may be given oxygen as needed.  Your breathing, heart rate, and blood pressure will be monitored during the procedure.  The medical or dental procedure  will be done. The procedure may vary among health care providers and hospitals.   What happens after the procedure?  Your blood pressure, heart rate, breathing rate, and blood oxygen level will be monitored until you leave the hospital or clinic.  You will get fluids through your IV if needed.  Do not drive or operate machinery until your health care provider says that it is safe. Summary  Sedation is the use of medicines to promote relaxation and to relieve discomfort and anxiety. Moderate conscious sedation is a type of sedation that is used during short medical and dental procedures.  Tell the health care provider about any medical conditions that you have and about all the medicines that you are taking.  You will be given the sedative as a pill, a spray through the nose, an injection into the muscle, or an injection into the vein through an IV. Vital signs are monitored during the sedation.  Moderate conscious sedation allows you to return to your regular activities sooner. This information is not intended to replace advice given to you by your health care provider. Make sure you discuss any questions you have with your health care provider. Document Revised: 08/27/2019 Document Reviewed: 03/25/2019 [employer] Patient Education  2021 [employer] GENERAL RISKS AND COMPLICATIONS  What are the risk, side effects and possible complications? Generally speaking, most procedures are safe.  However, with any procedure there are risks, side effects, and the possibility of complications.  The risks and complications are dependent upon the sites that are lesioned, or the type of nerve block to be performed.  The closer the procedure is to the spine, the more serious the risks are.  Great care is taken when placing the radio frequency needles, block needles or lesioning probes, but sometimes complications can occur. 1. Infection: Any time there is an injection through the skin, there is a risk of  infection.  This is why sterile conditions are used for these blocks.  There are four possible types of infection. 1. Localized skin infection. 2. Central Nervous System Infection-This can be in the form of Meningitis, which can be deadly. 3. Epidural Infections-This can be in the form of an epidural abscess, which can cause pressure inside of the spine, causing compression of the spinal cord with subsequent paralysis. This would require an emergency surgery to decompress, and there are no guarantees that the patient would recover from the paralysis. 4. Discitis-This is an infection of the intervertebral discs.  It occurs in about 1% of discography procedures.  It is difficult to treat and it may lead to surgery.        2. Pain: the needles have to go through skin and soft tissues, will cause soreness.       3. Damage to internal structures:  The nerves to be lesioned may be near blood vessels or    other nerves which can be potentially damaged.       4. Bleeding: Bleeding is more common if the patient is taking blood thinners such as  aspirin, Coumadin, Ticiid, Plavix, etc., or if he/she have some genetic predisposition  such as hemophilia. Bleeding  into the spinal canal can cause compression of the spinal  cord with subsequent paralysis.  This would require an emergency surgery to  decompress and there are no guarantees that the patient would recover from the  paralysis.       5. Pneumothorax:  Puncturing of a lung is a possibility, every time a needle is introduced in  the area of the chest or upper back.  Pneumothorax refers to free air around the  collapsed lung(s), inside of the thoracic cavity (chest cavity).  Another two possible  complications related to a similar event would include: Hemothorax and Chylothorax.   These are variations of the Pneumothorax, where instead of air around the collapsed  lung(s), you may have blood or chyle, respectively.       6. Spinal headaches: They may occur with  any procedures in the area of the spine.       7. Persistent CSF (Cerebro-Spinal Fluid) leakage: This is a rare problem, but may occur  with prolonged intrathecal or epidural catheters either due to the formation of a fistulous  track or a dural tear.       8. Nerve damage: By working so close to the spinal cord, there is always a possibility of  nerve damage, which could be as serious as a permanent spinal cord injury with  paralysis.       9. Death:  Although rare, severe deadly allergic reactions known as "Anaphylactic  reaction" can occur to any of the medications used.      10. Worsening of the symptoms:  We can always make thing worse.  What are the chances of something like this happening? Chances of any of this occuring are extremely low.  By statistics, you have more of a chance of getting killed in a motor vehicle accident: while driving to the hospital than any of the above occurring .  Nevertheless, you should be aware that they are possibilities.  In general, it is similar to taking a shower.  Everybody knows that you can slip, hit your head and get killed.  Does that mean that you should not shower again?  Nevertheless always keep in mind that statistics do not mean anything if you happen to be on the wrong side of them.  Even if a procedure has a 1 (one) in a 1,000,000 (million) chance of going wrong, it you happen to be that one..Also, keep in mind that by statistics, you have more of a chance of having something go wrong when taking medications.  Who should not have this procedure? If you are on a blood thinning medication (e.g. Coumadin, Plavix, see list of "Blood Thinners"), or if you have an active infection going on, you should not have the procedure.  If you are taking any blood thinners, please inform your physician.  How should I prepare for this procedure?  Do not eat or drink anything at least six hours prior to the procedure.  Bring a driver with you .  It cannot be a  taxi.  Come accompanied by an adult that can drive you back, and that is strong enough to help you if your legs get weak or numb from the local anesthetic.  Take all of your medicines the morning of the procedure with just enough water to swallow them.  If you have diabetes, make sure that you are scheduled to have your procedure done first thing in the morning, whenever possible.  If you have diabetes, take only  half of your insulin dose and notify our nurse that you have done so as soon as you arrive at the clinic.  If you are diabetic, but only take blood sugar pills (oral hypoglycemic), then do not take them on the morning of your procedure.  You may take them after you have had the procedure.  Do not take aspirin or any aspirin-containing medications, at least eleven (11) days prior to the procedure.  They may prolong bleeding.  Wear loose fitting clothing that may be easy to take off and that you would not mind if it got stained with Betadine or blood.  Do not wear any jewelry or perfume  Remove any nail coloring.  It will interfere with some of our monitoring equipment.  NOTE: Remember that this is not meant to be interpreted as a complete list of all possible complications.  Unforeseen problems may occur.  BLOOD THINNERS The following drugs contain aspirin or other products, which can cause increased bleeding during surgery and should not be taken for 2 weeks prior to and 1 week after surgery.  If you should need take something for relief of minor pain, you may take acetaminophen which is found in Tylenol,m Datril, Anacin-3 and Panadol. It is not blood thinner. The products listed below are.  Do not take any of the products listed below in addition to any listed on your instruction sheet.  A.P.C or A.P.C with Codeine Codeine Phosphate Capsules #3 Ibuprofen Ridaura  ABC compound Congesprin Imuran rimadil  Advil Cope Indocin Robaxisal  Alka-Seltzer Effervescent Pain Reliever and  Antacid Coricidin or Coricidin-D  Indomethacin Rufen  Alka-Seltzer plus Cold Medicine Cosprin Ketoprofen S-A-C Tablets  Anacin Analgesic Tablets or Capsules Coumadin Korlgesic Salflex  Anacin Extra Strength Analgesic tablets or capsules CP-2 Tablets Lanoril Salicylate  Anaprox Cuprimine Capsules Levenox Salocol  Anexsia-D Dalteparin Magan Salsalate  Anodynos Darvon compound Magnesium Salicylate Sine-off  Ansaid Dasin Capsules Magsal Sodium Salicylate  Anturane Depen Capsules Marnal Soma  APF Arthritis pain formula Dewitt's Pills Measurin Stanback  Argesic Dia-Gesic Meclofenamic Sulfinpyrazone  Arthritis Bayer Timed Release Aspirin Diclofenac Meclomen Sulindac  Arthritis pain formula Anacin Dicumarol Medipren Supac  Analgesic (Safety coated) Arthralgen Diffunasal Mefanamic Suprofen  Arthritis Strength Bufferin Dihydrocodeine Mepro Compound Suprol  Arthropan liquid Dopirydamole Methcarbomol with Aspirin Synalgos  ASA tablets/Enseals Disalcid Micrainin Tagament  Ascriptin Doan's Midol Talwin  Ascriptin A/D Dolene Mobidin Tanderil  Ascriptin Extra Strength Dolobid Moblgesic Ticlid  Ascriptin with Codeine Doloprin or Doloprin with Codeine Momentum Tolectin  Asperbuf Duoprin Mono-gesic Trendar  Aspergum Duradyne Motrin or Motrin IB Triminicin  Aspirin plain, buffered or enteric coated Durasal Myochrisine Trigesic  Aspirin Suppositories Easprin Nalfon Trillsate  Aspirin with Codeine Ecotrin Regular or Extra Strength Naprosyn Uracel  Atromid-S Efficin Naproxen Ursinus  Auranofin Capsules Elmiron Neocylate Vanquish  Axotal Emagrin Norgesic Verin  Azathioprine Empirin or Empirin with Codeine Normiflo Vitamin E  Azolid Emprazil Nuprin Voltaren  Bayer Aspirin plain, buffered or children's or timed BC Tablets or powders Encaprin Orgaran Warfarin Sodium  Buff-a-Comp Enoxaparin Orudis Zorpin  Buff-a-Comp with Codeine Equegesic Os-Cal-Gesic   Buffaprin Excedrin plain, buffered or Extra Strength  Oxalid   Bufferin Arthritis Strength Feldene Oxphenbutazone   Bufferin plain or Extra Strength Feldene Capsules Oxycodone with Aspirin   Bufferin with Codeine Fenoprofen Fenoprofen Pabalate or Pabalate-SF   Buffets II Flogesic Panagesic   Buffinol plain or Extra Strength Florinal or Florinal with Codeine Panwarfarin   Buf-Tabs Flurbiprofen Penicillamine   Butalbital Compound Four-way cold tablets Penicillin  Butazolidin Fragmin Pepto-Bismol   Carbenicillin Geminisyn Percodan   Carna Arthritis Reliever Geopen Persantine   Carprofen Gold's salt Persistin   Chloramphenicol Goody's Phenylbutazone   Chloromycetin Haltrain Piroxlcam   Clmetidine heparin Plaquenil   Cllnoril Hyco-pap Ponstel   Clofibrate Hydroxy chloroquine Propoxyphen         Before stopping any of these medications, be sure to consult the physician who ordered them.  Some, such as Coumadin (Warfarin) are ordered to prevent or treat serious conditions such as "deep thrombosis", "pumonary embolisms", and other heart problems.  The amount of time that you may need off of the medication may also vary with the medication and the reason for which you were taking it.  If you are taking any of these medications, please make sure you notify your pain physician before you undergo any procedures.         Epidural Steroid Injection Patient Information  Description: The epidural space surrounds the nerves as they exit the spinal cord.  In some patients, the nerves can be compressed and inflamed by a bulging disc or a tight spinal canal (spinal stenosis).  By injecting steroids into the epidural space, we can bring irritated nerves into direct contact with a potentially helpful medication.  These steroids act directly on the irritated nerves and can reduce swelling and inflammation which often leads to decreased pain.  Epidural steroids may be injected anywhere along the spine and from the neck to the low back depending upon the  location of your pain.   After numbing the skin with local anesthetic (like Novocaine), a small needle is passed into the epidural space slowly.  You may experience a sensation of pressure while this is being done.  The entire block usually last less than 10 minutes.  Conditions which may be treated by epidural steroids:   Low back and leg pain  Neck and arm pain  Spinal stenosis  Post-laminectomy syndrome  Herpes zoster (shingles) pain  Pain from compression fractures  Preparation for the injection:  1. Do not eat any solid food or dairy products within 8 hours of your appointment.  2. You may drink clear liquids up to 3 hours before appointment.  Clear liquids include water, black coffee, juice or soda.  No milk or cream please. 3. You may take your regular medication, including pain medications, with a sip of water before your appointment  Diabetics should hold regular insulin (if taken separately) and take 1/2 normal NPH dos the morning of the procedure.  Carry some sugar containing items with you to your appointment. 4. A driver must accompany you and be prepared to drive you home after your procedure.  5. Bring all your current medications with your. 6. An IV may be inserted and sedation may be given at the discretion of the physician.   7. A blood pressure cuff, EKG and other monitors will often be applied during the procedure.  Some patients may need to have extra oxygen administered for a short period. 8. You will be asked to provide medical information, including your allergies, prior to the procedure.  We must know immediately if you are taking blood thinners (like Coumadin/Warfarin)  Or if you are allergic to IV iodine contrast (dye). We must know if you could possible be pregnant.  Possible side-effects:  Bleeding from needle site  Infection (rare, may require surgery)  Nerve injury (rare)  Numbness & tingling (temporary)  Difficulty urinating (rare,  temporary)  Spinal headache ( a headache  worse with upright posture)  Light -headedness (temporary)  Pain at injection site (several days)  Decreased blood pressure (temporary)  Weakness in arm/leg (temporary)  Pressure sensation in back/neck (temporary)  Call if you experience:  Fever/chills associated with headache or increased back/neck pain.  Headache worsened by an upright position.  New onset weakness or numbness of an extremity below the injection site  Hives or difficulty breathing (go to the emergency room)  Inflammation or drainage at the infection site  Severe back/neck pain  Any new symptoms which are concerning to you  Please note:  Although the local anesthetic injected can often make your back or neck feel good for several hours after the injection, the pain will likely return.  It takes 3-7 days for steroids to work in the epidural space.  You may not notice any pain relief for at least that one week.  If effective, we will often do a series of three injections spaced 3-6 weeks apart to maximally decrease your pain.  After the initial series, we generally will wait several months before considering a repeat injection of the same type.  If you have any questions, please call [phone] [facility]

## 2020-06-13 NOTE — Progress Notes (Signed)
PROVIDER NOTE: Information contained herein reflects review and annotations entered in association with encounter. Interpretation of such information and data should be left to medically-trained personnel. Information provided to patient can be located elsewhere in the medical record under "Patient Instructions". Document created using STT-dictation technology, any transcriptional errors that may result from process are unintentional.    Patient: Scott Tucker  Service Category: E/M  Provider: Gillis Santa, MD  DOB: 1965-07-23  DOS: 06/13/2020  Specialty: Interventional Pain Management  MRN: [id number]  Setting: Ambulatory outpatient  PCP: Kerin Perna, NP  Type: Established Patient    Referring Provider: Kerin Perna, NP  Location: Office  Delivery: Face-to-face     HPI  Scott Tucker, a 55 y.o. year old male, is here today because of his Cervical radicular pain [M54.12]. Scott Tucker primary complain today is Neck Pain and Back Pain (lower) Last encounter: My last encounter with him was on 05/17/2020. Pertinent problems: Scott Tucker has Chronic back pain; Coronary artery disease; Chronic radicular cervical pain; Foraminal stenosis of cervical region (right); Chronic pain syndrome; Cervical myelopathy with cervical radiculopathy (HCC); and History of lumbar fusion on their pertinent problem list. Pain Assessment: Severity of Chronic pain is reported as a 10-Worst pain ever/10. Location: Neck  /denies. Onset: More than a month ago. Quality: Aching. Timing: Constant. Modifying factor(s):  Marland Kitchen Vitals:  height is 5' 6"  (1.676 m) and weight is 185 lb (83.9 kg). His temporal temperature is 97.1 F (36.2 C) (abnormal). His blood pressure is 140/98 (abnormal) and his pulse is 99. His respiration is 18 and oxygen saturation is 98%.   Reason for encounter: post-procedure assessment.     Post-Procedure Evaluation  Procedure (05/17/2020):   Type: Diagnostic, Inter-Laminar, Cervical Epidural  Steroid Injection  #1  Region: Posterior Cervico-thoracic Region Level: C7-T1 Laterality: Right-Sided Paramedial  Sedation: Please see nurses note.  Effectiveness during initial hour after procedure(Ultra-Short Term Relief): 0 %   Local anesthetic used: Long-acting (4-6 hours) Effectiveness: Defined as any analgesic benefit obtained secondary to the administration of local anesthetics. This carries significant diagnostic value as to the etiological location, or anatomical origin, of the pain. Duration of benefit is expected to coincide with the duration of the local anesthetic used.  Effectiveness during initial 4-6 hours after procedure(Short-Term Relief): 30 %.  Long-term benefit: Defined as any relief past the pharmacologic duration of the local anesthetics.  Effectiveness past the initial 6 hours after procedure(Long-Term Relief): 30 % (X 2 days)   Current benefits: Defined as benefit that persist at this time.   Analgesia:  Back to baseline Function: Back to baseline ROM: Back to baseline   ROS  Constitutional: Denies any fever or chills Gastrointestinal: No reported hemesis, hematochezia, vomiting, or acute GI distress Musculoskeletal: Neck pain with radiation into right upper extremity, low back pain with radiation into bilateral lower extremity in a dermatomal fashion Neurological: No reported episodes of acute onset apraxia, aphasia, dysarthria, agnosia, amnesia, paralysis, loss of coordination, or loss of consciousness  Medication Review  FLUoxetine, amLODipine, aspirin, atorvastatin, hydrOXYzine, losartan, methocarbamol, nitroGLYCERIN, and pregabalin  History Review  Allergy: Scott Tucker is allergic to latex. Drug: Scott Tucker  reports current drug use. Drug: Marijuana. Alcohol:  reports current alcohol use. Tobacco:  reports that he has never smoked. He has never used smokeless tobacco. Social: Scott Tucker  reports that he has never smoked. He has never used smokeless  tobacco. He reports current alcohol use. He reports current drug use. Drug: Marijuana.  Medical:  has a past medical history of Chronic back pain, Coronary artery disease, Diabetes mellitus without complication ([place]), and HTN (hypertension). Surgical: Scott Tucker  has a past surgical history that includes Back surgery and Shoulder surgery. Family: family history includes Heart disease in his maternal uncle.  Laboratory Chemistry Profile   Renal Lab Results  Component Value Date   BUN 8 03/13/2020   CREATININE 0.68 03/13/2020   BCR 14 12/22/2019   GFRAA 128 12/22/2019   GFRNONAA >60 03/13/2020     Hepatic Lab Results  Component Value Date   AST 11 12/22/2019   ALT 17 12/22/2019   ALBUMIN 4.5 12/22/2019   ALKPHOS 114 12/22/2019     Electrolytes Lab Results  Component Value Date   NA 135 03/13/2020   K 3.3 (L) 03/13/2020   CL 99 03/13/2020   CALCIUM 9.0 03/13/2020     Bone No results found for: VD25OH, VD125OH2TOT, WN0272ZD6, UY4034VQ2, 25OHVITD1, 25OHVITD2, 25OHVITD3, TESTOFREE, TESTOSTERONE   Inflammation (CRP: Acute Phase) (ESR: Chronic Phase) No results found for: CRP, ESRSEDRATE, LATICACIDVEN     Note: Above Lab results reviewed.  Physical Exam  General appearance: Well nourished, well developed, and well hydrated. In no apparent acute distress Mental status: Alert, oriented x 3 (person, place, & time)       Respiratory: No evidence of acute respiratory distress Eyes: PERLA Vitals: BP (!) 140/98   Pulse 99   Temp (!) 97.1 F (36.2 C) (Temporal)   Resp 18   Ht 5' 6"  (1.676 m)   Wt 185 lb (83.9 kg)   SpO2 98%   BMI 29.86 kg/m  BMI: Estimated body mass index is 29.86 kg/m as calculated from the following:   Height as of this encounter: 5' 6"  (1.676 m).   Weight as of this encounter: 185 lb (83.9 kg). Ideal: Ideal body weight: 63.8 kg (140 lb 10.5 oz) Adjusted ideal body weight: 71.8 kg (158 lb 6.3 oz)  Cervical Spine Exam  Skin & Axial Inspection: No  masses, redness, edema, swelling, or associated skin lesions Alignment: Symmetrical Functional ROM: Pain restricted ROM, bilaterally Stability: No instability detected Muscle Tone/Strength: Functionally intact. No obvious neuro-muscular anomalies detected. Sensory (Neurological): Dermatomal pain pattern right>left Palpation: No palpable anomalies             Positive Spurling's on the right       Upper Extremity (UE) Exam    Side: Right upper extremity  Side: Left upper extremity   Skin & Extremity Inspection: Skin color, temperature, and hair growth are WNL. No peripheral edema or cyanosis. No masses, redness, swelling, asymmetry, or associated skin lesions. No contractures.  Skin & Extremity Inspection: Skin color, temperature, and hair growth are WNL. No peripheral edema or cyanosis. No masses, redness, swelling, asymmetry, or associated skin lesions. No contractures.   Functional ROM: Pain restricted ROM for all joints of upper extremity  Functional ROM: Unrestricted ROM           Muscle Tone/Strength: Functionally intact. No obvious neuro-muscular anomalies detected.   Muscle Tone/Strength: Functionally intact. No obvious neuro-muscular anomalies detected.   Sensory (Neurological): Dermatomal pain pattern          Sensory (Neurological): Unimpaired           Palpation: No palpable anomalies              Palpation: No palpable anomalies               Provocative Test(s):  Phalen's test:  deferred Tinel's test: deferred Apley's scratch test (touch opposite shoulder):  Action 1 (Across chest): Decreased ROM Action 2 (Overhead): Decreased ROM Action 3 (LB reach): Decreased ROM   Provocative Test(s):  Phalen's test: deferred Tinel's test: deferred Apley's scratch test (touch opposite shoulder):  Action 1 (Across chest): deferred Action 2 (Overhead): deferred Action 3 (LB reach): deferred    4 out of 5 strength RIGHT upper extremity: Shoulder abduction, elbow flexion, elbow  extension, thumb extension. 5 out of 5 strength LEFT upper extremity: Shoulder abduction, elbow flexion, elbow extension, thumb extension.   Lumbar Exam  Skin & Axial Inspection: Well healed scar from previous spine surgery detected Alignment: Symmetrical Functional ROM: Pain restricted ROM       Stability: No instability detected Muscle Tone/Strength: Functionally intact. No obvious neuro-muscular anomalies detected. Sensory (Neurological): Dermatomal pain pattern   Gait & Posture Assessment  Ambulation: Unassisted Gait: Relatively normal for age and body habitus Posture: WNL   Lower Extremity Exam    Side: Right lower extremity  Side: Left lower extremity  Stability: No instability observed          Stability: No instability observed          Skin & Extremity Inspection: Skin color, temperature, and hair growth are WNL. No peripheral edema or cyanosis. No masses, redness, swelling, asymmetry, or associated skin lesions. No contractures.  Skin & Extremity Inspection: Skin color, temperature, and hair growth are WNL. No peripheral edema or cyanosis. No masses, redness, swelling, asymmetry, or associated skin lesions. No contractures.  Functional ROM: Unrestricted ROM                  Functional ROM: Unrestricted ROM                  Muscle Tone/Strength: Functionally intact. No obvious neuro-muscular anomalies detected.  Muscle Tone/Strength: Functionally intact. No obvious neuro-muscular anomalies detected.  Sensory (Neurological): Unimpaired        Sensory (Neurological): Unimpaired        DTR: Patellar: deferred today Achilles: deferred today Plantar: deferred today  DTR: Patellar: deferred today Achilles: deferred today Plantar: deferred today  Palpation: No palpable anomalies  Palpation: No palpable anomalies     Assessment   Status Diagnosis  Having a Flare-up Having a Flare-up Having a Flare-up 1. Cervical radicular pain   2. Foraminal stenosis of  cervical region (right)   3. Cervical myelopathy with cervical radiculopathy (HCC)   4. History of lumbar fusion   5. Chronic radicular cervical pain (right)   6. Chronic pain syndrome   7. Other intervertebral disc degeneration, lumbar region      Updated Problems: Problem  Chronic Radicular Cervical Pain  Foraminal stenosis of cervical region (right)  Chronic Pain Syndrome  Cervical Myelopathy With Cervical Radiculopathy (Hcc)  History of Lumbar Fusion  Chronic Back Pain  Coronary Artery Disease    Plan of Care  Mr. ADVITH MARTINE has a current medication list which includes the following long-term medication(s): amlodipine, atorvastatin, fluoxetine, losartan, nitroglycerin, and pregabalin.  1.  Limited analgesic benefit with for cervical epidural steroid injection.  Patient's most recent cervical MRI shows cervical myelopathy and cervical radiculopathy as patient has C3-C4 and C4-C5 cord flattening due to disc herniations with associated high-grade foraminal impingement on the right at C3-C4 and bilaterally at C4-C5 and C5-C6.  Consider repeating cervical ESI in the future.  As needed order placed.  Referral to neurosurgery for surgical evaluation.  2.  Chronic lumbar  radicular pain, right greater than left.  History of lumbar spinal fusion.  Having increased pain radiating down his leg.  Has participated in physical therapy in the past with limited benefit.  Has tried NSAIDs, acetaminophen, gabapentin in the past without much benefit.  Utilizes marijuana to help with his pain.  Lumbar MRI with and without contrast given history of prior lumbar spinal fusion with worsening lumbar radicular pain.  3.  Prescription for Lyrica and Robaxin as below.  Pharmacotherapy (Medications Ordered): Meds ordered this encounter  Medications  . pregabalin (LYRICA) 75 MG capsule    Sig: Take 1 capsule (75 mg total) by mouth 2 (two) times daily.    Dispense:  60 capsule    Refill:  1    Fill one  day early if pharmacy is closed on scheduled refill date. May substitute for generic if available.  . methocarbamol (ROBAXIN) 750 MG tablet    Sig: Take 1 tablet (750 mg total) by mouth 2 (two) times daily as needed for muscle spasms.    Dispense:  60 tablet    Refill:  1    Do not place this medication, or any other prescription from our practice, on "Automatic Refill". Patient may have prescription filled one day early if pharmacy is closed on scheduled refill date.   Orders:  Orders Placed This Encounter  Procedures  . Cervical Epidural Injection    Procedure: Cervical Epidural Steroid Injection/Block Purpose: Diagnostic Indication(s): Radiculitis and/or cervicalgia associater with cervical degenerative disc disease.    Standing Status:   Standing    Number of Occurrences:   6    Standing Expiration Date:   06/13/2021    Scheduling Instructions:     Level(s): C7-T1     Laterality: TBD     Sedation: Patient's choice.     Timeframe: PRN    Order Specific Question:   Where will this procedure be performed?    Answer:   [facility]    Comments:   Blaize Nipper  . MR LUMBAR SPINE W WO CONTRAST    Patient presents with axial pain with possible radicular component.  In addition to any acute findings, please report on:  1. Facet (Zygapophyseal) joint DJD (Hypertrophy, space narrowing, subchondral sclerosis, and/or osteophyte formation) 2. DDD and/or IVDD (Loss of disc height, desiccation or "Black disc disease") 3. Pars defects 4. Spondylolisthesis, spondylosis, and/or spondyloarthropathies (include Degree/Grade of displacement in mm) 5. Vertebral body Fractures, including age (old, new/acute) 73. Modic Type Changes 7. Demineralization 8. Bone pathology 9. Central, Lateral Recess, and/or Foraminal Stenosis (include AP diameter of stenosis in mm) 10. Surgical changes (hardware type, status, and presence of fibrosis) NOTE: Please specify level(s) and laterality.    Standing Status:    Future    Standing Expiration Date:   09/10/2020    Order Specific Question:   If indicated for the ordered procedure, I authorize the administration of contrast media per Radiology protocol    Answer:   Yes    Order Specific Question:   What is the patient's sedation requirement?    Answer:   No Sedation    Order Specific Question:   Does the patient have a pacemaker or implanted devices?    Answer:   No    Order Specific Question:   Call Results- Best Contact Number?    Answer:   [phone] ([facility])    Order Specific Question:   Radiology Contrast Protocol - do NOT remove file path  Answer:   [email or web address]    Order Specific Question:   Preferred imaging location?    Answer:   [facility] (table limit - 500 lbs)  . Ambulatory referral to Neurosurgery    Referral Priority:   Routine    Referral Type:   Surgical    Referral Reason:   Specialty Services Required    Referred to Provider:   Consuella Lose, MD    Requested Specialty:   Neurosurgery    Number of Visits Requested:   1   Follow-up plan:   Return for will call with MRI results .     Right C7-T1 ESI 05/17/2020    Recent Visits Date Type Provider Dept  05/17/20 Procedure visit Gillis Santa, MD [facility]  05/01/20 Office Visit Gillis Santa, MD [facility]  Showing recent visits within past 90 days and meeting all other requirements Today's Visits Date Type Provider Dept  06/13/20 Office Visit Gillis Santa, MD [facility]  Showing today's visits and meeting all other requirements Future Appointments No visits were found meeting these conditions. Showing future appointments within next 90 days and meeting all other requirements  I discussed the assessment and treatment plan with the patient. The patient was provided an opportunity to ask questions and all were answered. The patient agreed with the plan and demonstrated an understanding  of the instructions.  Patient advised to call back or seek an in-person evaluation if the symptoms or condition worsens.  Duration of encounter:39mnutes.  Note by: BGillis Santa MD Date: 06/13/2020; Time: 1:19 PM

## 2020-06-13 NOTE — Progress Notes (Signed)
Safety precautions to be maintained throughout the outpatient stay will include: orient to surroundings, keep bed in low position, maintain call bell within reach at all times, provide assistance with transfer out of bed and ambulation.  

## 2020-06-20 ENCOUNTER — Ambulatory Visit: Payer: Medicaid Other

## 2020-06-27 ENCOUNTER — Ambulatory Visit: Payer: Medicaid Other

## 2020-06-28 ENCOUNTER — Other Ambulatory Visit: Payer: Self-pay

## 2020-06-28 ENCOUNTER — Ambulatory Visit (HOSPITAL_COMMUNITY)
Admission: RE | Admit: 2020-06-28 | Discharge: 2020-06-28 | Disposition: A | Discharge status: Home / Self Care | Payer: Self-pay | Source: Ambulatory Visit | Attending: Student in an Organized Health Care Education/Training Program | Admitting: Student in an Organized Health Care Education/Training Program

## 2020-06-28 DIAGNOSIS — M5136 Other intervertebral disc degeneration, lumbar region: Secondary | ICD-10-CM | POA: Insufficient documentation

## 2020-06-28 MED ORDER — GADOBUTROL 1 MMOL/ML IV SOLN
8.0000 mL | Freq: Once | INTRAVENOUS | Status: AC | PRN
  Administered 2020-06-28: 8 mL via INTRAVENOUS

## 2020-07-03 ENCOUNTER — Ambulatory Visit: Payer: Medicaid Other

## 2020-07-06 ENCOUNTER — Telehealth: Payer: Self-pay | Admitting: Primary Care

## 2020-07-06 NOTE — Telephone Encounter (Signed)
I return Pt call, Pt is schedule for 07/11/20

## 2020-07-06 NOTE — Telephone Encounter (Signed)
Copied from CRM [id number]. Topic: General - Other >> Jul 05, 2020  8:35 AM Gaetana Michaelis A wrote: Reason for CRM: Patient had an appointment for financial counseling on 07/03/20 Patient was unable to make the appointment and would like to be rescheduled Patient has paperwork that is due by 07/10/20 and inquired if it could be dropped off at the office Please contact to advise   Please advise if patient can drop off paperwork.

## 2020-07-11 ENCOUNTER — Other Ambulatory Visit (INDEPENDENT_AMBULATORY_CARE_PROVIDER_SITE_OTHER): Payer: Self-pay | Admitting: Primary Care

## 2020-07-11 ENCOUNTER — Ambulatory Visit: Payer: Self-pay | Attending: Primary Care

## 2020-07-11 ENCOUNTER — Other Ambulatory Visit: Payer: Self-pay

## 2020-07-11 MED FILL — AMLODIPINE BESYLATE 10 MG T: 10 | 30 days supply | Qty: 30 | Fill #4

## 2020-07-11 MED FILL — hydrOXYzine HCL 25 MG TABS: 25 | 30 days supply | Qty: 30 | Fill #0

## 2020-07-11 MED FILL — LOSARTAN POTASSIUM 25 MG TA: 25 | 30 days supply | Qty: 30 | Fill #4

## 2020-07-11 MED FILL — ?ATORVASTATIN 40MG TABLET: 40 | 30 days supply | Qty: 30 | Fill #4

## 2020-07-13 MED FILL — METHOCARBAMOL 750 MG TABS: 750 | 30 days supply | Qty: 60 | Fill #1

## 2020-07-17 ENCOUNTER — Other Ambulatory Visit: Payer: Self-pay | Admitting: Student in an Organized Health Care Education/Training Program

## 2020-07-19 ENCOUNTER — Telehealth: Payer: Self-pay | Admitting: Student in an Organized Health Care Education/Training Program

## 2020-07-19 DIAGNOSIS — M5412 Radiculopathy, cervical region: Secondary | ICD-10-CM

## 2020-07-19 DIAGNOSIS — M4802 Spinal stenosis, cervical region: Secondary | ICD-10-CM

## 2020-07-19 NOTE — Telephone Encounter (Signed)
Ms. Adela Glimpse called to ask about the referral for Mr. Cowger to DR. Barbaraann Barthel in [place]. This is the only Dr. He can see due to his coverage, no insurance.  Please ask Dr. Cherylann Ratel to put order in for referral to this phys please. Thank you

## 2020-07-24 NOTE — Telephone Encounter (Signed)
JOyce sent out.

## 2020-07-25 ENCOUNTER — Ambulatory Visit (INDEPENDENT_AMBULATORY_CARE_PROVIDER_SITE_OTHER): Payer: Medicaid Other | Admitting: Primary Care

## 2020-07-27 ENCOUNTER — Other Ambulatory Visit (INDEPENDENT_AMBULATORY_CARE_PROVIDER_SITE_OTHER): Payer: Self-pay | Admitting: Primary Care

## 2020-07-27 ENCOUNTER — Encounter (INDEPENDENT_AMBULATORY_CARE_PROVIDER_SITE_OTHER): Payer: Self-pay | Admitting: Primary Care

## 2020-07-27 ENCOUNTER — Other Ambulatory Visit: Payer: Self-pay

## 2020-07-27 ENCOUNTER — Ambulatory Visit (INDEPENDENT_AMBULATORY_CARE_PROVIDER_SITE_OTHER): Payer: Self-pay | Admitting: Primary Care

## 2020-07-27 VITALS — BP 122/84 | HR 83 | Temp 97.3°F | Ht 66.0 in | Wt 188.8 lb

## 2020-07-27 DIAGNOSIS — E119 Type 2 diabetes mellitus without complications: Secondary | ICD-10-CM

## 2020-07-27 DIAGNOSIS — M2550 Pain in unspecified joint: Secondary | ICD-10-CM

## 2020-07-27 DIAGNOSIS — G894 Chronic pain syndrome: Secondary | ICD-10-CM

## 2020-07-27 DIAGNOSIS — R42 Dizziness and giddiness: Secondary | ICD-10-CM

## 2020-07-27 DIAGNOSIS — Z131 Encounter for screening for diabetes mellitus: Secondary | ICD-10-CM

## 2020-07-27 LAB — POCT GLYCOSYLATED HEMOGLOBIN (HGB A1C): Hemoglobin A1C: 8.6 % — AB (ref 4.0–5.6)

## 2020-07-27 MED ORDER — GLIPIZIDE 10 MG PO TABS: 10.0000 mg | ORAL_TABLET | Freq: Two times a day (BID) | ORAL | 3 refills | Status: DC

## 2020-07-27 MED ORDER — MECLIZINE HCL 12.5 MG PO TABS: 12.5000 mg | ORAL_TABLET | Freq: Three times a day (TID) | ORAL | 0 refills | Status: DC | PRN

## 2020-07-27 MED ORDER — METFORMIN HCL 1000 MG PO TABS: 1000.0000 mg | ORAL_TABLET | Freq: Two times a day (BID) | ORAL | 3 refills | Status: DC

## 2020-07-27 MED FILL — ?METFORMIN HCL 1000 MG TAB: 1000 | 30 days supply | Qty: 60 | Fill #0

## 2020-07-27 MED FILL — ?glipiZIDE 10MG TABLETS: 10 | 30 days supply | Qty: 60 | Fill #0

## 2020-07-27 MED FILL — ?MECLIZINE 12.5 MG TABL: 12.5 | 10 days supply | Qty: 30 | Fill #0

## 2020-07-27 NOTE — Progress Notes (Signed)
Established Patient Office Visit  Subjective:  Patient ID: Scott Tucker, male    DOB: 05-Feb-1966  Age: 55 y.o. MRN: [id number]  CC:  Chief Complaint  Patient presents with  . Follow-up    vertigo    HPI Mr. JADIEL SCHMIEDER is a 55 year old male who has presents for vertigo twice last week and room was spinning . He was unable to get up when he tried he fell so laid in the bed until dizziness was gone. New dx T2D -DIABETES Hypoglycemic episodes:yes - headaches, dizzy denies tremors or sweating , Polydipsia/polyuria: yes, Visual disturbance: yes, Chest pain: no, Paresthesias: yes  Past Medical History:  Diagnosis Date  . Chronic back pain   . Coronary artery disease   . Diabetes mellitus without complication ([place])   . HTN (hypertension)     Past Surgical History:  Procedure Laterality Date  . BACK SURGERY    . SHOULDER SURGERY      Family History  Problem Relation Age of Onset  . Heart disease Maternal Uncle     Social History   Socioeconomic History  . Marital status: Single    Spouse name: Not on file  . Number of children: Not on file  . Years of education: Not on file  . Highest education level: Not on file  Occupational History  . Not on file  Tobacco Use  . Smoking status: Never Smoker  . Smokeless tobacco: Never Used  Substance and Sexual Activity  . Alcohol use: Yes    Comment: occasional  . Drug use: Yes    Types: Marijuana  . Sexual activity: Not on file  Other Topics Concern  . Not on file  Social History Narrative  . Not on file   Social Determinants of Health   Financial Resource Strain: Not on file  Food Insecurity: Not on file  Transportation Needs: Not on file  Physical Activity: Not on file  Stress: Not on file  Social Connections: Not on file  Intimate Partner Violence: Not on file    Outpatient Medications Prior to Visit  Medication Sig Dispense Refill  . amLODipine (NORVASC) 10 MG tablet Take 1 tablet (10 mg total) by  mouth daily. 90 tablet 1  . aspirin 81 MG EC tablet Take 81 mg by mouth daily.    Marland Kitchen atorvastatin (LIPITOR) 40 MG tablet Take 1 tablet (40 mg total) by mouth daily at 6 PM. 90 tablet 1  . FLUoxetine (PROZAC) 20 MG tablet Take 1 tablet (20 mg total) by mouth daily. 90 tablet 1  . hydrOXYzine (ATARAX/VISTARIL) 25 MG tablet TAKE 1 TABLET (25 MG TOTAL) BY MOUTH AT BEDTIME AS NEEDED FOR ANXIETY. 90 tablet 0  . losartan (COZAAR) 25 MG tablet Take 1 tablet (25 mg total) by mouth daily. 90 tablet 3  . methocarbamol (ROBAXIN) 750 MG tablet Take 1 tablet (750 mg total) by mouth 2 (two) times daily as needed for muscle spasms. 60 tablet 1  . nitroGLYCERIN (NITROSTAT) 0.4 MG SL tablet Place 1 tablet (0.4 mg total) under the tongue every 5 (five) minutes as needed for chest pain. 75 tablet 1  . pregabalin (LYRICA) 75 MG capsule Take 1 capsule (75 mg total) by mouth 2 (two) times daily. 60 capsule 1   No facility-administered medications prior to visit.    Allergies  Allergen Reactions  . Latex Rash    ROS Review of Systems  Endocrine: Positive for polyuria.  Musculoskeletal: Positive for arthralgias, back  pain, neck pain and neck stiffness.  Neurological: Positive for dizziness, numbness and headaches.  All other systems reviewed and are negative.     Objective:    Physical Exam Vitals reviewed.  Constitutional:      Appearance: He is obese.  HENT:     Head: Normocephalic.     Right Ear: Tympanic membrane and external ear normal.     Left Ear: Tympanic membrane and external ear normal.     Nose: Nose normal.  Eyes:     Extraocular Movements: Extraocular movements intact.     Pupils: Pupils are equal, round, and reactive to light.  Cardiovascular:     Rate and Rhythm: Normal rate and regular rhythm.  Pulmonary:     Effort: Pulmonary effort is normal.     Breath sounds: Normal breath sounds.  Abdominal:     General: Abdomen is flat. Bowel sounds are normal.     Palpations: Abdomen is  soft.  Musculoskeletal:        General: Normal range of motion.     Cervical back: Normal range of motion.  Skin:    General: Skin is warm and dry.  Neurological:     Mental Status: He is alert and oriented to person, place, and time.  Psychiatric:        Mood and Affect: Mood normal.        Behavior: Behavior normal.        Thought Content: Thought content normal.        Judgment: Judgment normal.     BP 122/84 (BP Location: Right Arm, Patient Position: Sitting, Cuff Size: Large)   Pulse 83   Temp (!) 97.3 F (36.3 C) (Temporal)   Ht 5' 6"  (1.676 m)   Wt 188 lb 12.8 oz (85.6 kg)   SpO2 96%   BMI 30.47 kg/m  Wt Readings from Last 3 Encounters:  07/27/20 188 lb 12.8 oz (85.6 kg)  06/13/20 185 lb (83.9 kg)  05/17/20 185 lb (83.9 kg)     Health Maintenance Due  Topic Date Due  . PNEUMOCOCCAL POLYSACCHARIDE VACCINE AGE 79-64 HIGH RISK  Never done  . FOOT EXAM  Never done  . OPHTHALMOLOGY EXAM  Never done  . COLONOSCOPY (Pts 45-34yr Insurance coverage will need to be confirmed)  Never done    There are no preventive care reminders to display for this patient.  No results found for: TSH Lab Results  Component Value Date   WBC 8.7 07/27/2020   HGB 15.0 07/27/2020   HCT 44.8 07/27/2020   MCV 94 07/27/2020   PLT 343 07/27/2020   Lab Results  Component Value Date   NA 142 07/27/2020   K 4.4 07/27/2020   CO2 21 07/27/2020   GLUCOSE 173 (H) 07/27/2020   BUN 8 07/27/2020   CREATININE 0.65 (L) 07/27/2020   BILITOT 1.0 07/27/2020   ALKPHOS 114 07/27/2020   AST 9 07/27/2020   ALT 15 07/27/2020   PROT 7.3 07/27/2020   ALBUMIN 5.1 (H) 07/27/2020   CALCIUM 9.7 07/27/2020   ANIONGAP 13 03/13/2020   Lab Results  Component Value Date   CHOL 138 07/27/2020   Lab Results  Component Value Date   HDL 46 07/27/2020   Lab Results  Component Value Date   LDLCALC 78 07/27/2020   Lab Results  Component Value Date   TRIG 69 07/27/2020   Lab Results  Component  Value Date   CHOLHDL 3.0 07/27/2020   Lab Results  Component Value Date   HGBA1C 8.6 (A) 07/27/2020      Assessment & Plan:  Jostin was seen today for follow-up.  Diagnoses and all orders for this visit: Lavarr was seen today for follow-up.  Diagnoses and all orders for this visit:  Screening for diabetes mellitus -     HgB A1c 8.6  Type 2 diabetes mellitus without complication, without long-term current use of insulin (HCC) ADA recommends Goal of therapy: Less than 6.5 hemoglobin A1c. Monitor foods that are high in carbohydrates are the following rice, potatoes, breads, sugars, and pastas.  Reduction in the intake (eating) will assist in lowering your blood sugars. -     Lipid Panel -     CMP14+EGFR -     CBC with Differential -     metFORMIN (GLUCOPHAGE) 1000 MG tablet; Take 1 tablet (1,000 mg total) by mouth 2 (two) times daily with a meal. -     glipiZIDE (GLUCOTROL) 10 MG tablet; Take 1 tablet (10 mg total) by mouth 2 (two) times daily before a meal.  Arthralgia, unspecified joint -     Ambulatory referral to Orthopedic Surgery  Chronic pain syndrome Followed by Pain managment   Dizzy spells DIZZINESS  Feeling dizzy for 2-3 days. Dizziness is off balance  Feels like room spins: yes Lightheadedness when stands: sometimes  Palpitations or heart racing: no Prior dizziness: no  Medications tried: no Taking blood thinners: no  Symptoms Hearing Loss: no Ear Pain or fullness: no Nausea or vomiting: no Vision difficulty or double vision: no Falls: no Head trauma: cervical pain Weakness in arm or leg: no Speaking problems: no Headache: at times   -     meclizine (ANTIVERT) 12.5 MG tablet; Take 1 tablet (12.5 mg total) by mouth 3 (three) times daily as needed for dizziness.      Follow-up: Return in about 3 months (around 10/27/2020) for DM.    Kerin Perna, NP

## 2020-07-28 LAB — CBC WITH DIFFERENTIAL/PLATELET
Basophils Absolute: 0.1 10*3/uL (ref 0.0–0.2)
Basos: 1 %
EOS (ABSOLUTE): 0.1 10*3/uL (ref 0.0–0.4)
Eos: 2 %
Hematocrit: 44.8 % (ref 37.5–51.0)
Hemoglobin: 15 g/dL (ref 13.0–17.7)
Immature Grans (Abs): 0 10*3/uL (ref 0.0–0.1)
Immature Granulocytes: 0 %
Lymphocytes Absolute: 2.5 10*3/uL (ref 0.7–3.1)
Lymphs: 29 %
MCH: 31.4 pg (ref 26.6–33.0)
MCHC: 33.5 g/dL (ref 31.5–35.7)
MCV: 94 fL (ref 79–97)
Monocytes Absolute: 0.6 10*3/uL (ref 0.1–0.9)
Monocytes: 7 %
Neutrophils Absolute: 5.4 10*3/uL (ref 1.4–7.0)
Neutrophils: 61 %
Platelets: 343 10*3/uL (ref 150–450)
RBC: 4.77 x10E6/uL (ref 4.14–5.80)
RDW: 12.5 % (ref 11.6–15.4)
WBC: 8.7 10*3/uL (ref 3.4–10.8)

## 2020-07-28 LAB — CMP14+EGFR
ALT: 15 IU/L (ref 0–44)
AST: 9 IU/L (ref 0–40)
Albumin/Globulin Ratio: 2.3 — ABNORMAL HIGH (ref 1.2–2.2)
Albumin: 5.1 g/dL — ABNORMAL HIGH (ref 3.8–4.9)
Alkaline Phosphatase: 114 IU/L (ref 44–121)
BUN/Creatinine Ratio: 12 (ref 9–20)
BUN: 8 mg/dL (ref 6–24)
Bilirubin Total: 1 mg/dL (ref 0.0–1.2)
CO2: 21 mmol/L (ref 20–29)
Calcium: 9.7 mg/dL (ref 8.7–10.2)
Chloride: 103 mmol/L (ref 96–106)
Creatinine, Ser: 0.65 mg/dL — ABNORMAL LOW (ref 0.76–1.27)
Globulin, Total: 2.2 g/dL (ref 1.5–4.5)
Glucose: 173 mg/dL — ABNORMAL HIGH (ref 65–99)
Potassium: 4.4 mmol/L (ref 3.5–5.2)
Sodium: 142 mmol/L (ref 134–144)
Total Protein: 7.3 g/dL (ref 6.0–8.5)
eGFR: 112 mL/min/{1.73_m2} (ref >59)

## 2020-07-28 LAB — LIPID PANEL
Chol/HDL Ratio: 3 ratio (ref 0.0–5.0)
Cholesterol, Total: 138 mg/dL (ref 100–199)
HDL: 46 mg/dL (ref >39)
LDL Chol Calc (NIH): 78 mg/dL (ref 0–99)
Triglycerides: 69 mg/dL (ref 0–149)
VLDL Cholesterol Cal: 14 mg/dL (ref 5–40)

## 2020-08-02 ENCOUNTER — Other Ambulatory Visit (INDEPENDENT_AMBULATORY_CARE_PROVIDER_SITE_OTHER): Payer: Self-pay | Admitting: Primary Care

## 2020-08-02 DIAGNOSIS — E785 Hyperlipidemia, unspecified: Secondary | ICD-10-CM

## 2020-08-02 MED ORDER — ATORVASTATIN CALCIUM 10 MG PO TABS: 10.0000 mg | ORAL_TABLET | Freq: Every day | ORAL | 1 refills | Status: DC

## 2020-08-02 MED FILL — ATORVASTATIN 10 MG TABLET: 10 | 30 days supply | Qty: 30 | Fill #0

## 2020-08-07 ENCOUNTER — Encounter: Payer: Self-pay | Admitting: Physician Assistant

## 2020-08-07 ENCOUNTER — Ambulatory Visit (INDEPENDENT_AMBULATORY_CARE_PROVIDER_SITE_OTHER): Payer: Self-pay | Admitting: Physician Assistant

## 2020-08-07 DIAGNOSIS — M545 Low back pain, unspecified: Secondary | ICD-10-CM

## 2020-08-08 ENCOUNTER — Encounter: Payer: Self-pay | Admitting: Orthopaedic Surgery

## 2020-08-08 ENCOUNTER — Other Ambulatory Visit: Payer: Self-pay

## 2020-08-08 ENCOUNTER — Ambulatory Visit (INDEPENDENT_AMBULATORY_CARE_PROVIDER_SITE_OTHER): Payer: Self-pay | Admitting: Orthopaedic Surgery

## 2020-08-08 VITALS — BP 120/79 | HR 65 | Ht 66.0 in | Wt 188.0 lb

## 2020-08-08 DIAGNOSIS — M5412 Radiculopathy, cervical region: Secondary | ICD-10-CM

## 2020-08-08 DIAGNOSIS — M542 Cervicalgia: Secondary | ICD-10-CM

## 2020-08-08 DIAGNOSIS — G959 Disease of spinal cord, unspecified: Secondary | ICD-10-CM

## 2020-08-08 DIAGNOSIS — G8929 Other chronic pain: Secondary | ICD-10-CM

## 2020-08-08 DIAGNOSIS — M4802 Spinal stenosis, cervical region: Secondary | ICD-10-CM

## 2020-08-08 NOTE — Progress Notes (Signed)
Office Visit Note   Patient: Scott Tucker           Date of Birth: Feb 15, 1966           MRN: [id number] Visit Date: 08/08/2020              Requested by: Grayce Sessions, NP [address] [place],  [place] [address] PCP: Grayce Sessions, NP   Assessment & Plan: Visit Diagnoses:  1. Neck pain   2. Foraminal stenosis of cervical region (right)   3. Chronic radicular cervical pain   4. Cervical myelopathy with cervical radiculopathy (HCC)     Plan: Patient has cord flattening compression with the stenosis.  Last MRI was a year ago he needs a new updated cervical MRI scan.  Office follow-up after scan for review.  Follow-Up Instructions: Office follow-up after cervical MRI.  He will return for review.  Orders:  Orders Placed This Encounter  Procedures  . MR Cervical Spine w/o contrast   No orders of the defined types were placed in this encounter.     Procedures: No procedures performed   Clinical Data: No additional findings.   Subjective: Chief Complaint  Patient presents with  . Neck - Pain    HPI 55 year old male seen with cervical disc degeneration with cervical stenosis with bilateral arm pain worse on the right than left.  He states he had cervical epidural injection done at the [facility] pain management clinic.  He was told by the doctor there that he needs his neck surgery likely before his back surgery.  He has had previous decompression or microdiscectomy Dr. Simonne Come many years ago and then later had single level fusion by Dr. Gerlene Fee with pedicle screws on one side and a facet screw the opposite.  Patient has some stenosis at the L4-5 level above his solid L5-S1 fusion.  Patient is living with his girlfriend he has not worked since about 2019.  He states he is having more numbness and tingling in his radial 3 fingers on his left hand.  He states he also has neck pain difficulty turning his neck.  At times he has trouble  walking not sure if it is related to his back or his neck.  Review of Systems positive for hypertension hyperlipidemia cervical stenosis with foraminal stenosis coronary artery disease.  Positive THC, negative cigarettes.  Previous lumbar fusion.  Stenosis at the L4-5 level above with back and leg pain. Positive for DM with recent A1C 8.6  and previously 4.9.   Negative for MI or stroke.   Objective: Vital Signs: BP 120/79   Pulse 65   Ht 5\' 6"  (1.676 m)   Wt 188 lb (85.3 kg)   BMI 30.34 kg/m   Physical Exam Constitutional:      Appearance: He is well-developed.  HENT:     Head: Normocephalic and atraumatic.  Eyes:     Pupils: Pupils are equal, round, and reactive to light.  Neck:     Thyroid: No thyromegaly.     Trachea: No tracheal deviation.  Cardiovascular:     Rate and Rhythm: Normal rate.  Pulmonary:     Effort: Pulmonary effort is normal.     Breath sounds: No wheezing.  Abdominal:     General: Bowel sounds are normal.     Palpations: Abdomen is soft.  Skin:    General: Skin is warm and dry.     Capillary Refill: Capillary refill takes less than  2 seconds.  Neurological:     Mental Status: He is alert and oriented to person, place, and time.  Psychiatric:        Behavior: Behavior normal.        Thought Content: Thought content normal.        Judgment: Judgment normal.     Ortho Exam well-healed lumbar incision.  Anterior tib gastrocsoleus is active.  Negative myelopathic gait no clonus.  He has decreased cervical range of motion 50% rotation and tilting.  Specialty Comments:  No specialty comments available.  Imaging: CLINICAL DATA:  Cervical spine stenosis with chronic right shoulder pain  EXAM: MRI CERVICAL SPINE WITHOUT CONTRAST  TECHNIQUE: Multiplanar, multisequence MR imaging of the cervical spine was performed. No intravenous contrast was administered.  COMPARISON:  None.  FINDINGS: Alignment: Reversal of cervical  lordosis.  Vertebrae: Discogenic endplate edema at C5-8. No fracture, discitis, or aggressive lesion.  Cord: Degenerative compression noted above below. No cord signal abnormality.  Posterior Fossa, vertebral arteries, paraspinal tissues: Bilateral maxillary sinusitis with fluid level.  Disc levels:  C2-3: Unremarkable.  C3-4: Disc bulging with right paracentral inferiorly migrating extrusion flattening the cord. Right uncovertebral ridging and foraminal impingement.  C4-5: Disc narrowing and bulging with endplate ridging and a right paracentral protrusion compressing the right cord. Disc narrowing and uncovertebral spurring causes biforaminal impingement.  C5-6: Disc narrowing and bulging with ridging of the endplates and uncovertebral joints on the left more than right. Left more than right foraminal impingement that is high-grade  C6-7: Disc narrowing with uncovertebral ridging and high-grade left foraminal impingement.  C7-T1:Unremarkable.  IMPRESSION: 1. C3-4 and C4-5 cord flattening due to disc herniations. 2. High-grade foraminal impingement on the right at C3-4, bilaterally at C4-5 and C5-6, and on the left at C6-7. 3. Bilateral maxillary sinusitis with fluid levels.   Electronically Signed   By: Marnee Spring M.D.   On: 07/04/2019 12:01   PMFS History: Patient Active Problem List   Diagnosis Date Noted  . Chronic radicular cervical pain 05/01/2020  . Foraminal stenosis of cervical region (right) 05/01/2020  . Chronic pain syndrome 05/01/2020  . Cervical myelopathy with cervical radiculopathy (HCC) 05/01/2020  . History of lumbar fusion 05/01/2020  . HTN (hypertension)   . Diabetes mellitus without complication (HCC)   . Chronic back pain   . Coronary artery disease   . CAD (coronary artery disease) 03/22/2019  . Hypertension 02/23/2019  . Hyperlipidemia 02/23/2019  . Chest pain 02/22/2019   Past Medical History:  Diagnosis Date  .  Chronic back pain   . Coronary artery disease   . Diabetes mellitus without complication (HCC)   . HTN (hypertension)     Family History  Problem Relation Age of Onset  . Heart disease Maternal Uncle     Past Surgical History:  Procedure Laterality Date  . BACK SURGERY    . SHOULDER SURGERY     Social History   Occupational History  . Not on file  Tobacco Use  . Smoking status: Never Smoker  . Smokeless tobacco: Never Used  Substance and Sexual Activity  . Alcohol use: Yes    Comment: occasional  . Drug use: Yes    Types: Marijuana  . Sexual activity: Not on file

## 2020-08-19 ENCOUNTER — Ambulatory Visit (HOSPITAL_COMMUNITY)
Admission: RE | Admit: 2020-08-19 | Discharge: 2020-08-19 | Disposition: A | Discharge status: Home / Self Care | Payer: Self-pay | Source: Ambulatory Visit | Attending: Orthopaedic Surgery | Admitting: Orthopaedic Surgery

## 2020-08-19 ENCOUNTER — Other Ambulatory Visit: Payer: Self-pay

## 2020-08-19 DIAGNOSIS — M542 Cervicalgia: Secondary | ICD-10-CM | POA: Insufficient documentation

## 2020-08-29 ENCOUNTER — Other Ambulatory Visit: Payer: Self-pay

## 2020-08-29 ENCOUNTER — Other Ambulatory Visit: Payer: Self-pay | Admitting: Interventional Cardiology

## 2020-08-29 MED FILL — Methocarbamol Tab 750 MG: ORAL | 30 days supply | Qty: 60 | Fill #0 | Status: AC

## 2020-08-29 MED FILL — Amlodipine Besylate Tab 10 MG (Base Equivalent): ORAL | 30 days supply | Qty: 30 | Fill #0 | Status: AC

## 2020-08-29 MED FILL — Hydroxyzine HCl Tab 25 MG: ORAL | 30 days supply | Qty: 30 | Fill #0 | Status: AC

## 2020-08-30 ENCOUNTER — Encounter: Payer: Self-pay | Admitting: Orthopaedic Surgery

## 2020-08-30 ENCOUNTER — Other Ambulatory Visit: Payer: Self-pay

## 2020-08-30 ENCOUNTER — Ambulatory Visit (INDEPENDENT_AMBULATORY_CARE_PROVIDER_SITE_OTHER): Payer: Self-pay | Admitting: Orthopaedic Surgery

## 2020-08-30 VITALS — BP 142/89 | HR 67 | Ht 66.0 in | Wt 188.0 lb

## 2020-08-30 DIAGNOSIS — M4802 Spinal stenosis, cervical region: Secondary | ICD-10-CM | POA: Insufficient documentation

## 2020-08-30 MED ORDER — LOSARTAN POTASSIUM 25 MG PO TABS
25.0000 mg | ORAL_TABLET | Freq: Every day | ORAL | 0 refills | Status: DC
  Filled 2020-08-30 – 2020-10-25 (×2): qty 30, 30d supply, fill #0

## 2020-08-30 NOTE — Progress Notes (Signed)
Office Visit Note   Patient: Scott Tucker           Date of Birth: 09-27-1965           MRN: [id number] Visit Date: 08/30/2020              Requested by: Grayce Sessions, NP [address] [place],  [place] [address] PCP: Grayce Sessions, NP   Assessment & Plan: Visit Diagnoses:  1. Foraminal stenosis of cervical region (right)   2. Spinal stenosis of cervical region     Plan: Discussed with patient in order to proceed with elective cervical fusion he needs his A1c below 7.7.  Some of the prednisone is had in the past either orally or with injections may have aggravated his A1c level.  Discussed with patient that since he gets his A1c below 7.7 he can call and we will get him scheduled for surgery the following week for a two-level cervical fusion .  Today's visit we reviewed MRI scan and report, lumbar spine MRI scan and report procedure plan risks of surgery dysphagia dysphonia, pseudoarthrosis, reoperation.  Importance of improved diabetic control to decrease complication rest with surgery.  Follow-Up Instructions: Return in about 8 weeks (around 10/25/2020).   Orders:  No orders of the defined types were placed in this encounter.  No orders of the defined types were placed in this encounter.     Procedures: No procedures performed   Clinical Data: No additional findings.   Subjective: Chief Complaint  Patient presents with  . Lower Back - Pain, Follow-up  . Neck - Pain, Follow-up    HPI 55 year old male returns for cervical MRI scan which shows extensive spondylosis with large C3-4 right paracentral disc protrusion with cord flattening and moderate spinal stenosis.  He also has cord flattening on the right at C4-5 with severe right foraminal stenosis and moderate left foraminal stenosis.  Milder changes at other levels.  He also has lumbar disc protrusion which we have discussed but currently last A1c was 8.6.  At 1 point he was on insulin not currently on  insulin.  Discussed with him that he needs to have his A1c below 7.7 to proceed with surgery in absence of myelopathy.  He continues to have bilateral arm pain worse on the right than left.  Previous cervical epidurals performed at [facility] pain management clinic.  Had previous lumbar fusion by Dr. Gerlene Fee at L5-S1 with solid fusion but now has some stenosis disc protrusion at L4-5 above the fusion.  Patient is here with his fiance.  He has tingling radial 3 fingers of his left hand.  Difficulty with turning his neck.  He just is not working.  Does have some coronary artery disease.  1 year ago A1c was only 4.9.  Review of Systems 14 point update unchanged from 08/08/2020 office visit other than as mentioned above.   Objective: Vital Signs: BP (!) 142/89   Pulse 67   Ht 5\' 6"  (1.676 m)   Wt 188 lb (85.3 kg)   BMI 30.34 kg/m   Physical Exam Constitutional:      Appearance: He is well-developed.  HENT:     Head: Normocephalic and atraumatic.  Eyes:     Pupils: Pupils are equal, round, and reactive to light.  Neck:     Thyroid: No thyromegaly.     Trachea: No tracheal deviation.  Cardiovascular:     Rate and Rhythm: Normal rate.  Pulmonary:  Effort: Pulmonary effort is normal.     Breath sounds: No wheezing.  Abdominal:     General: Bowel sounds are normal.     Palpations: Abdomen is soft.  Skin:    General: Skin is warm and dry.     Capillary Refill: Capillary refill takes less than 2 seconds.  Neurological:     Mental Status: He is alert and oriented to person, place, and time.  Psychiatric:        Behavior: Behavior normal.        Thought Content: Thought content normal.        Judgment: Judgment normal.     Ortho Exam well-healed lumbar incision negative straight leg raising 90 degrees.  No myelopathic gait.  Short stride gait.  He is able to heel and toe walk.  Who presents cervical range of motion with discomfort.  Some brachial plexus  tenderness.  Upper extremity reflexes are symmetrical.  Specialty Comments:  No specialty comments available.  Imaging: CLINICAL DATA:  Spinal stenosis. Chronic neck pain with left arm pain and numbness  EXAM: MRI CERVICAL SPINE WITHOUT CONTRAST  TECHNIQUE: Multiplanar, multisequence MR imaging of the cervical spine was performed. No intravenous contrast was administered.  COMPARISON:  MRI cervical spine 07/04/2019  FINDINGS: Alignment: Mild retrolisthesis C3-4, C4-5, C5-6, C6-7. Straightening of the cervical lordosis.  Vertebrae: Negative for fracture or mass.  Cord: Mild cord hyperintensity at C3-4 appears new. No syrinx or cord mass.  Posterior Fossa, vertebral arteries, paraspinal tissues: Negative  Disc levels:  C2-3: Mild facet degeneration on the left. Negative for spinal or foraminal stenosis  C3-4: Moderate to large central and right-sided disc protrusion with mild progression. There is moderate cord flattening right greater than left and moderate spinal stenosis, with mild progression from the prior study. Slight cord hyperintensity on the right is new. Moderate to severe right foraminal stenosis. Moderate left foraminal stenosis.  C4-5: Disc degeneration and spondylosis. Diffuse uncinate spurring right greater than left. Cord flattening on the right with mild spinal stenosis. Severe right foraminal stenosis and moderate to severe left foraminal stenosis.  C5-6: Disc degeneration with diffuse endplate spurring. Moderate to severe foraminal stenosis bilaterally left greater than right. Mild spinal stenosis.  C6-7: Disc degeneration with diffuse uncinate spurring. Mild spinal stenosis. Moderate left foraminal narrowing and mild right foraminal narrowing  C7-T1: Negative  IMPRESSION: Extensive cervical spondylosis. Multilevel disc degeneration and spurring.  Moderate to large right paracentral disc protrusion at C3-4 with cord  flattening and moderate spinal stenosis on the right. Disc protrusion shows slight progression. Small area of cord hyperintensity on the right at C3-4 is new.  Multilevel spinal and foraminal stenosis throughout the remainder of the cervical spine similar to the prior study.   Electronically Signed   By: Marlan Palau M.D.   On: 08/20/2020 13:30    PMFS History: Patient Active Problem List   Diagnosis Date Noted  . Spinal stenosis of cervical region 08/30/2020  . Chronic radicular cervical pain 05/01/2020  . Foraminal stenosis of cervical region (right) 05/01/2020  . Chronic pain syndrome 05/01/2020  . Cervical myelopathy with cervical radiculopathy (HCC) 05/01/2020  . History of lumbar fusion 05/01/2020  . HTN (hypertension)   . Diabetes mellitus without complication (HCC)   . Chronic back pain   . Coronary artery disease   . CAD (coronary artery disease) 03/22/2019  . Hypertension 02/23/2019  . Hyperlipidemia 02/23/2019  . Chest pain 02/22/2019   Past Medical History:  Diagnosis Date  .  Chronic back pain   . Coronary artery disease   . Diabetes mellitus without complication (HCC)   . HTN (hypertension)     Family History  Problem Relation Age of Onset  . Heart disease Maternal Uncle     Past Surgical History:  Procedure Laterality Date  . BACK SURGERY    . SHOULDER SURGERY     Social History   Occupational History  . Not on file  Tobacco Use  . Smoking status: Never Smoker  . Smokeless tobacco: Never Used  Substance and Sexual Activity  . Alcohol use: Yes    Comment: occasional  . Drug use: Yes    Types: Marijuana  . Sexual activity: Not on file

## 2020-08-31 ENCOUNTER — Other Ambulatory Visit: Payer: Self-pay

## 2020-09-07 ENCOUNTER — Other Ambulatory Visit: Payer: Self-pay

## 2020-10-25 ENCOUNTER — Other Ambulatory Visit (INDEPENDENT_AMBULATORY_CARE_PROVIDER_SITE_OTHER): Payer: Self-pay | Admitting: Primary Care

## 2020-10-25 ENCOUNTER — Other Ambulatory Visit: Payer: Self-pay

## 2020-10-25 DIAGNOSIS — I10 Essential (primary) hypertension: Secondary | ICD-10-CM

## 2020-10-25 MED FILL — Hydroxyzine HCl Tab 25 MG: ORAL | 30 days supply | Qty: 30 | Fill #1 | Status: AC

## 2020-10-25 NOTE — Telephone Encounter (Signed)
  Notes to clinic:  medication requested is not non current medication list  Review for refill   Requested Prescriptions  Pending Prescriptions Disp Refills   amLODipine (NORVASC) 10 MG tablet 90 tablet 1    Sig: TAKE 1 TABLET (10 MG TOTAL) BY MOUTH DAILY.      Cardiovascular:  Calcium Channel Blockers Failed - 10/25/2020  8:22 AM      Failed - Last BP in normal range    BP Readings from Last 1 Encounters:  08/30/20 (!) 142/89          Passed - Valid encounter within last 6 months    Recent Outpatient Visits           3 months ago Screening for diabetes mellitus   [facility] Grayce Sessions, NP   10 months ago Essential hypertension   [facility] Grayce Sessions, NP   1 year ago Essential hypertension   [facility] Grayce Sessions, NP   1 year ago Depression, unspecified depression type   [facility] Grayce Sessions, NP   1 year ago Chronic right shoulder pain   [facility] Grayce Sessions, NP       Future Appointments             In 2 days Grayce Sessions, NP [facility]

## 2020-10-27 ENCOUNTER — Ambulatory Visit (INDEPENDENT_AMBULATORY_CARE_PROVIDER_SITE_OTHER): Payer: Medicaid Other | Admitting: Primary Care

## 2020-10-27 ENCOUNTER — Other Ambulatory Visit: Payer: Self-pay

## 2020-10-27 ENCOUNTER — Other Ambulatory Visit (HOSPITAL_COMMUNITY): Payer: Self-pay

## 2020-10-27 VITALS — BP 135/88 | HR 73 | Temp 97.5°F | Resp 16 | Wt 189.0 lb

## 2020-10-27 DIAGNOSIS — E785 Hyperlipidemia, unspecified: Secondary | ICD-10-CM

## 2020-10-27 DIAGNOSIS — F4323 Adjustment disorder with mixed anxiety and depressed mood: Secondary | ICD-10-CM

## 2020-10-27 DIAGNOSIS — E119 Type 2 diabetes mellitus without complications: Secondary | ICD-10-CM

## 2020-10-27 LAB — POCT GLYCOSYLATED HEMOGLOBIN (HGB A1C): Hemoglobin A1C: 6.1 % — AB (ref 4.0–5.6)

## 2020-10-27 LAB — GLUCOSE, POCT (MANUAL RESULT ENTRY): POC Glucose: 6.1 mg/dl — AB (ref 70–99)

## 2020-10-27 MED ORDER — HYDROXYZINE HCL 25 MG PO TABS: ORAL_TABLET | ORAL | 0 refills | Status: DC

## 2020-10-27 MED ORDER — ATORVASTATIN CALCIUM 10 MG PO TABS
ORAL_TABLET | ORAL | 1 refills | Status: DC
  Filled 2020-10-27: qty 90, 90d supply, fill #0

## 2020-10-27 MED ORDER — METFORMIN HCL ER 500 MG PO TB24
500.0000 mg | ORAL_TABLET | Freq: Every day | ORAL | 1 refills | Status: DC
  Filled 2020-10-27: qty 90, 90d supply, fill #0

## 2020-10-27 MED ORDER — FLUOXETINE HCL 20 MG PO CAPS
20.0000 mg | ORAL_CAPSULE | Freq: Every day | ORAL | 1 refills | Status: DC
  Filled 2020-10-27: qty 90, 90d supply, fill #0

## 2020-10-27 MED ORDER — AMLODIPINE BESYLATE 10 MG PO TABS
ORAL_TABLET | Freq: Every day | ORAL | 1 refills | Status: DC
  Filled 2020-10-27: qty 90, 90d supply, fill #0

## 2020-10-27 MED ORDER — LOSARTAN POTASSIUM 25 MG PO TABS
25.0000 mg | ORAL_TABLET | Freq: Every day | ORAL | 1 refills | Status: DC
  Filled 2020-10-27 (×2): qty 90, 90d supply, fill #0

## 2020-10-27 NOTE — Progress Notes (Signed)
Follow up- DM  Medication RF 

## 2020-10-27 NOTE — Progress Notes (Signed)
[facility]     Scott Tucker is a 55 y.o. male who presents for an follow up evaluation of Type 2 diabetes mellitus.  Current symptoms/problems include paresthesia of the feet and visual disturbances and have been improving. Symptoms have been present for several years.  Current diabetic medications include oral agents (dual therapy): glipizide (generic), Glucophage.   The patient was initially diagnosed with Type 2 diabetes mellitus based on the following criteria:  ADA guidelines ( prediabetic) 6.1.  Current monitoring regimen: none Home blood sugar records:  no Any episodes of hypoglycemia? no  Known diabetic complications: nephropathy Cardiovascular risk factors: advanced age (older than 21 for men, 19 for women), diabetes mellitus, dyslipidemia, hypertension, obesity (BMI >= 30 kg/m2), and smoking/ tobacco exposure Eye exam current (within one year): no Weight trend: stable Prior visit with CDE: yes -  Current diet: diabetic, low salt Current exercise: none Medication Compliance?  Yes   Is He on ACE inhibitor or angiotensin II receptor blocker?  Yes  lisinopril (generic)   The following portions of the patient's history were reviewed and updated as appropriate: allergies, current medications, past family history, past medical history, past social history, past surgical history, and problem list.  Review of Systems  Eyes:        VISION CHANGES  Musculoskeletal:  Positive for back pain.  Skin:  Positive for itching and rash.  Psychiatric/Behavioral:  Positive for depression. The patient is nervous/anxious.   All other systems reviewed and are negative.  Objective:    BP 135/88 (BP Location: Left Arm, Patient Position: Sitting, Cuff Size: Normal)   Pulse 73   Temp (!) 97.5 F (36.4 C)   Resp 16   Wt 189 lb (85.7 kg)   SpO2 98%   BMI 30.51 kg/m   Physical Exam Vitals reviewed.  Constitutional:      Appearance: He is obese.  HENT:     Head:  Normocephalic.     Right Ear: Tympanic membrane and external ear normal.     Left Ear: Tympanic membrane and external ear normal.     Nose: Nose normal.  Eyes:     Extraocular Movements: Extraocular movements intact.     Pupils: Pupils are equal, round, and reactive to light.  Cardiovascular:     Rate and Rhythm: Normal rate and regular rhythm.  Pulmonary:     Effort: Pulmonary effort is normal.     Breath sounds: Normal breath sounds.  Abdominal:     General: Abdomen is flat. Bowel sounds are normal. There is distension.     Palpations: Abdomen is soft.  Musculoskeletal:        General: Normal range of motion.     Cervical back: Normal range of motion.  Skin:    General: Skin is warm and dry.  Neurological:     Mental Status: He is alert and oriented to person, place, and time.  Psychiatric:        Mood and Affect: Mood normal.        Behavior: Behavior normal.        Thought Content: Thought content normal.        Judgment: Judgment normal.     Lab Review Glucose (mg/dL)  Date Value  95/01/3266 173 (H)  12/22/2019 129 (H)  06/16/2019 107 (H)   Glucose, Bld (mg/dL)  Date Value  12/45/8099 133 (H)  06/24/2019 109 (H)  02/23/2019 98   CO2 (mmol/L)  Date Value  07/27/2020 21  03/13/2020 23  12/22/2019 25   BUN (mg/dL)  Date Value  10/62/6948 8  03/13/2020 8  12/22/2019 9  06/24/2019 7  06/16/2019 11   Creatinine, Ser (mg/dL)  Date Value  54/62/7035 0.65 (L)  03/13/2020 0.68  12/22/2019 0.65 (L)      Assessment:    Diabetes Mellitus type II, under excellent control.   Shinichi was seen today for diabetes.  Diagnoses and all orders for this visit:  Type 2 diabetes mellitus without complication, without long-term current use of insulin (HCC) -     HgB A1c -     Glucose (CBG)   Plan:    1.  Rx changes: yes d/c metformin 1000mg  bid and glucotrol 10mg  bid to metformin 500XR QD.  2.  Education: Reviewed 'ABCs' of diabetes management (respective  goals in parentheses):  A1C (<7), blood pressure (<130/80), and cholesterol (LDL <100). Counseled Patient on the risk factors of co- morbidities with uncontrol diabetes  Complications -diabetic retinopathy, (close your eyes ? What do you see nothing) nephropathy decrease in kidney function- can lead to dialysis-on a machine 3 days a week to filter your kidney, neuropathy- numbness and tinging in your hands and feet,  increase risk of heart attack and stroke, and amputation due to decrease wound healing and circulation. Decrease your risk by taking medication daily as prescribed, monitor carbohydrates- foods that are high in carbohydrates are the following rice, potatoes, breads, sugars, and pastas.  Reduction in the intake (eating) will assist in lowering your blood sugars. Exercise daily at least 30 minutes daily.   3. CHO counting diet discussed.  Reviewed CHO amount in various foods and how to read nutrition labels.  Discussed recommended serving sizes.   4.  Recommend check BG 0  times a day  5. Recommended increase physical activity - goal is 150 minutes per week Daryon was seen today for diabetes.  Diagnoses and all orders for this visit:  Hyperlipidemia, unspecified hyperlipidemia type Decrease your fatty foods, red meat, cheese, milk and increase fiber like whole grains and veggies. You can also add a fiber supplement like Metamucil or Benefiber.   -     atorvastatin (LIPITOR) 10 MG tablet; TAKE 1 TABLET (10 MG TOTAL) BY MOUTH DAILY AT 6 PM.  Adjustment disorder with mixed anxiety and depressed mood Flowsheet Row Office Visit from 10/27/2020 in [facility]  PHQ-9 Total Score 23      FLUoxetine (PROZAC) 20 MG capsule; Take 1 capsule (20 mg total) by mouth daily.  Other orders -     losartan (COZAAR) 25 MG tablet; Take 1 tablet (25 mg total) by mouth daily. Please make overdue appt with Dr. 10/29/2020 before anymore refills. Thank you 1st attempt -     hydrOXYzine  (ATARAX/VISTARIL) 25 MG tablet; TAKE 1 TABLET (25 MG TOTAL) BY MOUTH AT BEDTIME AS NEEDED FOR ANXIETY. -     metFORMIN (GLUCOPHAGE XR) 500 MG 24 hr tablet; Take 1 tablet (500 mg total) by mouth daily with breakfast.     This note has been created with [facility]. Any transcriptional errors are unintentional.   Eldridge Dace, NP 10/27/2020, 8:48 AM

## 2020-11-20 ENCOUNTER — Encounter (INDEPENDENT_AMBULATORY_CARE_PROVIDER_SITE_OTHER): Payer: Self-pay | Admitting: Primary Care

## 2020-11-20 ENCOUNTER — Telehealth (INDEPENDENT_AMBULATORY_CARE_PROVIDER_SITE_OTHER): Payer: Self-pay | Admitting: Primary Care

## 2020-11-20 NOTE — Telephone Encounter (Signed)
Patient requesting to speak with PCP regarding medication that was never prescribed on  10/27/2020, patient does not know the name and would like to speak with PCP. Patient did not want to elaborate

## 2020-11-21 ENCOUNTER — Other Ambulatory Visit: Payer: Self-pay

## 2020-11-22 ENCOUNTER — Telehealth (INDEPENDENT_AMBULATORY_CARE_PROVIDER_SITE_OTHER): Payer: Self-pay

## 2020-11-22 ENCOUNTER — Encounter: Payer: Self-pay | Admitting: Orthopaedic Surgery

## 2020-11-22 ENCOUNTER — Ambulatory Visit (INDEPENDENT_AMBULATORY_CARE_PROVIDER_SITE_OTHER): Payer: Medicaid Other | Admitting: Orthopaedic Surgery

## 2020-11-22 ENCOUNTER — Other Ambulatory Visit: Payer: Self-pay

## 2020-11-22 VITALS — BP 145/94 | HR 81 | Ht 66.0 in | Wt 184.0 lb

## 2020-11-22 DIAGNOSIS — L738 Other specified follicular disorders: Secondary | ICD-10-CM | POA: Diagnosis not present

## 2020-11-22 DIAGNOSIS — M4802 Spinal stenosis, cervical region: Secondary | ICD-10-CM

## 2020-11-22 DIAGNOSIS — B359 Dermatophytosis, unspecified: Secondary | ICD-10-CM | POA: Diagnosis not present

## 2020-11-22 MED ORDER — TERBINAFINE HCL 250 MG PO TABS
250.0000 mg | ORAL_TABLET | Freq: Every day | ORAL | 1 refills | Status: DC
  Filled 2020-11-22: qty 30, 30d supply, fill #0

## 2020-11-22 MED ORDER — CEPHALEXIN 500 MG PO CAPS
500.0000 mg | ORAL_CAPSULE | Freq: Two times a day (BID) | ORAL | 0 refills | Status: DC
  Filled 2020-11-22: qty 10, 5d supply, fill #0

## 2020-11-22 NOTE — Telephone Encounter (Signed)
Copied from CRM [id number]. Topic: General - Other >> Nov 21, 2020 10:08 AM Gaetana Michaelis A wrote: Reason for CRM: Patient would like to be contacted by a member of staff regarding prescription coordination   The patient shares that they previously discussed medication prescriptions with their PCP but contacted their pharmacy and had no new prescriptions  The patient was unable  to further elaborate to the agent   Please contact further discuss

## 2020-11-22 NOTE — Telephone Encounter (Signed)
It appears that all prescriptions were sent at patients last appointment. Please contact to coordinate prescriptions.

## 2020-11-22 NOTE — Telephone Encounter (Signed)
Patient confirms has all medications and knows how to take them and what they are for

## 2020-11-22 NOTE — Progress Notes (Signed)
  Office Visit Note              Patient: Scott Tucker                                           Date of Birth: 03/05/1966                                                    MRN: [id number] Visit Date: 11/22/2020                                                                     Requested by: Edwards, Michelle P, NP [address] [place],  [place] [address] PCP: Edwards, Michelle P, NP     Assessment & Plan: Visit Diagnoses:  1. Spinal stenosis of cervical region  2. Foraminal stenosis of cervical region (right)       Plan: Reviewed MRI scan with patient once again and girlfriend.  He has large central disc protrusion C3-4 cord flattening myelopathic change in the cord on the right at C3-4 and severe stenosis at C4-5.  Plan two-level cervical fusion C3-4, C4-5 with allograft and plate.  Plan overnight stay.  We discussed he may not get improvement since he has core changes.  He may get some partial return and improvement.  Procedure discussed risk surgery discussed including pseudoarthrosis r,eoperation ,possible need for posterior fusion.  Questions elicited and answered he understands request to proceed.   Follow-Up Instructions: pre-op for cervical fusion   Orders:  No orders of the defined types were placed in this encounter.   No orders of the defined types were placed in this encounter.        Procedures: No procedures performed     Clinical Data: No additional findings.     Subjective:    Chief Complaint  Patient presents with   Neck - Pain      HPI 55-year-old male returns with severe cervical stenosis with myelopathy, cord changes at C3-4 with diabetes now under better control with A1c on 10/27/2020 at 6.1.  Patient's had 5 falls in the last month.  He is ambulating with a custom carved cane.  Has wide spaced gait balance problems and has numbness and weakness in his hands.  He is taking Lyrica and Robaxin without relief.  MRI scan showed severe cord  compression at C3-4 and C4-5.  Mild to moderate changes at other levels but those 2 levels are severe.  Patient is here for discussion of cervical decompression and fusion for his cervical stenosis with myelopathy.   Review of SystemsPast history with problems with hypertension controlled hyperlipidemia.  Diabetes now under good control on insulin.  History of coronary artery disease no chest pain negative for MI.  He does have some lumbar stenosis at L4-5 with chronic bk pain.  Negative for fever or chills.  Negative for stroke,seizures.  All other systems noncontributory.     Objective: Vital Signs: BP (!) 145/94   Pulse   81   Ht 5' 6" (1.676 m)   Wt 184 lb (83.5 kg)   BMI 29.70 kg/m    Physical Exam Constitutional:      Appearance: He is well-developed. HENT:    Head: Normocephalic and atraumatic.    Right Ear: External ear normal.    Left Ear: External ear normal. Eyes:    Pupils: Pupils are equal, round, and reactive to light. Neck:    Thyroid: No thyromegaly.    Trachea: No tracheal deviation. Cardiovascular:    Rate and Rhythm: Normal rate. Pulmonary:    Effort: Pulmonary effort is normal.    Breath sounds: No wheezing. Abdominal:    General: Bowel sounds are normal.    Palpations: Abdomen is soft. Musculoskeletal:    Cervical back: Neck supple. Skin:    General: Skin is warm and dry.    Capillary Refill: Capillary refill takes less than 2 seconds. Neurological:    Mental Status: He is alert and oriented to person, place, and time. Psychiatric:        Behavior: Behavior normal.        Thought Content: Thought content normal.        Judgment: Judgment normal.     Ortho Exam patient has bilateral brachial plexus tenderness.  Cervical flexion limited to 50%.  Positive Lhermitte, positive Spurling right and left.  Slight symmetrical triceps weakness.  He is amatory with a wide spaced gait using his cane.  No lower extremity hyperreflexia no clonus.   Specialty  Comments:  No specialty comments available.   Imaging: CLINICAL DATA:  Spinal stenosis. Chronic neck pain with left arm pain and numbness   EXAM: MRI CERVICAL SPINE WITHOUT CONTRAST   TECHNIQUE: Multiplanar, multisequence MR imaging of the cervical spine was performed. No intravenous contrast was administered.   COMPARISON:  MRI cervical spine 07/04/2019   FINDINGS: Alignment: Mild retrolisthesis C3-4, C4-5, C5-6, C6-7. Straightening of the cervical lordosis.   Vertebrae: Negative for fracture or mass.   Cord: Mild cord hyperintensity at C3-4 appears new. No syrinx or cord mass.   Posterior Fossa, vertebral arteries, paraspinal tissues: Negative   Disc levels:   C2-3: Mild facet degeneration on the left. Negative for spinal or foraminal stenosis   C3-4: Moderate to large central and right-sided disc protrusion with mild progression. There is moderate cord flattening right greater than left and moderate spinal stenosis, with mild progression from the prior study. Slight cord hyperintensity on the right is new. Moderate to severe right foraminal stenosis. Moderate left foraminal stenosis.   C4-5: Disc degeneration and spondylosis. Diffuse uncinate spurring right greater than left. Cord flattening on the right with mild spinal stenosis. Severe right foraminal stenosis and moderate to severe left foraminal stenosis.   C5-6: Disc degeneration with diffuse endplate spurring. Moderate to severe foraminal stenosis bilaterally left greater than right. Mild spinal stenosis.   C6-7: Disc degeneration with diffuse uncinate spurring. Mild spinal stenosis. Moderate left foraminal narrowing and mild right foraminal narrowing   C7-T1: Negative   IMPRESSION: Extensive cervical spondylosis. Multilevel disc degeneration and spurring.   Moderate to large right paracentral disc protrusion at C3-4 with cord flattening and moderate spinal stenosis on the right. Disc protrusion  shows slight progression. Small area of cord hyperintensity on the right at C3-4 is new.   Multilevel spinal and foraminal stenosis throughout the remainder of the cervical spine similar to the prior study.     Electronically Signed   By: Charles  Clark M.D.     On: 08/20/2020 13:30     PMFS History:     Patient Active Problem List    Diagnosis Date Noted   Spinal stenosis of cervical region 08/30/2020   Chronic radicular cervical pain 05/01/2020   Foraminal stenosis of cervical region (right) 05/01/2020   Chronic pain syndrome 05/01/2020   Cervical myelopathy with cervical radiculopathy (HCC) 05/01/2020   History of lumbar fusion 05/01/2020   HTN (hypertension)     Diabetes mellitus without complication (HCC)     Chronic back pain     Coronary artery disease     CAD (coronary artery disease) 03/22/2019   Hypertension 02/23/2019   Hyperlipidemia 02/23/2019   Chest pain 02/22/2019        Past Medical History:  Diagnosis Date   Chronic back pain     Coronary artery disease     Diabetes mellitus without complication (HCC)     HTN (hypertension)           Family History  Problem Relation Age of Onset   Heart disease Maternal Uncle           Past Surgical History:  Procedure Laterality Date   BACK SURGERY       SHOULDER SURGERY        Social History         Occupational History   Not on file  Tobacco Use   Smoking status: Never   Smokeless tobacco: Never  Substance and Sexual Activity   Alcohol use: Yes      Comment: occasional   Drug use: Yes      Types: Marijuana   Sexual activity: Not on file        

## 2020-11-23 ENCOUNTER — Other Ambulatory Visit: Payer: Self-pay

## 2020-11-28 ENCOUNTER — Telehealth: Payer: Self-pay

## 2020-11-28 NOTE — Telephone Encounter (Signed)
Primary Cardiologist:Jayadeep Eldridge Dace, MD  Chart reviewed as part of pre-operative protocol coverage. Because of Scott Tucker past medical history and time since last visit, he/she will require a follow-up visit in order to better assess preoperative cardiovascular risk.  Pre-op covering staff: - Please schedule appointment and call patient to inform them. - Please contact requesting surgeon's office via preferred method (i.e, phone, fax) to inform them of need for appointment prior to surgery.  If applicable, this message will also be routed to pharmacy pool and/or primary cardiologist for input on holding anticoagulant/antiplatelet agent as requested below so that this information is available at time of patient's appointment.   Scott Asters, NP  11/28/2020, 3:14 PM

## 2020-11-28 NOTE — Telephone Encounter (Signed)
Reviewed the schedule for an appointment before Monday 12/04/20, but none of the offices had any open appointments. I offered the patient the first available which is 01/24/2021 with Scott Tucker. Patient agreeable and requested to be put on a cancellation list. I advise patient that I would contact his requesting providers office and make them aware. He voiced understanding.   Contacted Sherrie Billings at [facility] and made her aware that the earliest appointment we have to offer the patient is 01/24/2021 at 9:15. She wanted to know if the patient could see a PA or NP and I let her know this is the soonest appointment they have. She voiced understanding.

## 2020-11-28 NOTE — Telephone Encounter (Signed)
   [facility] Pre-operative Risk Assessment    Patient Name: Scott Tucker  DOB: 19-May-1965 MRN: [id number]  HEARTCARE STAFF:  - IMPORTANT!!!!!! Under Visit Info/Reason for Call, type in Other and utilize the format Clearance MM/DD/YY or Clearance TBD. Do not use dashes or single digits. - Please review there is not already an duplicate clearance open for this procedure. - If request is for dental extraction, please clarify the # of teeth to be extracted. - If the patient is currently at the dentist's office, call Pre-Op Callback Staff (MA/nurse) to input urgent request.  - If the patient is not currently in the dentist office, please route to the Pre-Op pool.  Request for surgical clearance:  What type of surgery is being performed? Cervical Fusion  When is this surgery scheduled? 12/04/2020 (surgery marked URGENT)  What type of clearance is required (medical clearance vs. Pharmacy clearance to hold med vs. Both)? Both  Are there any medications that need to be held prior to surgery and how long? None listed  Practice name and name of physician performing surgery? [facility] Scott Tucker  What is the office phone number? [phone] (Scott Tucker)   7.   What is the office fax number? [phone]  8.   Anesthesia type (None, local, MAC, general) ? Scott Tucker 11/28/2020, 3:04 PM  _________________________________________________________________   (provider comments below)

## 2020-11-29 ENCOUNTER — Other Ambulatory Visit: Payer: Self-pay

## 2020-11-29 NOTE — Progress Notes (Signed)
Surgical Instructions    Your procedure is scheduled on 12/04/20.  Report to [facility] Main Entrance "A" at 05:30 A.M., then check in with the Admitting office.  Call this number if you have problems the morning of surgery:  [phone]   If you have any questions prior to your surgery date call [phone]: Open Monday-Friday 8am-4pm    Remember:  Do not eat after midnight the night before your surgery  You may drink clear liquids until 04:30am the morning of your surgery.   Clear liquids allowed are: Water, Non-Citrus Juices (without pulp), Carbonated Beverages, Clear Tea, Black Coffee Only, and Gatorade  Patient Instructions  The night before surgery:  No food after midnight. ONLY clear liquids after midnight   The day of surgery (if you have diabetes): Drink ONE (1) 12 oz G2 given to you in your pre admission testing appointment by 04:30am the morning of surgery. Drink in one sitting. Do not sip.  This drink was given to you during your hospital  pre-op appointment visit.  Nothing else to drink after completing the  12 oz bottle of G2.         If you have questions, please contact your surgeon's office.     Take these medicines the morning of surgery with A SIP OF WATER  amLODipine (NORVASC) cephALEXin (KEFLEX)  FLUoxetine (PROZAC)  loratadine (CLARITIN) if needed nitroGLYCERIN (NITROSTAT) if needed for chest pain terbinafine (LAMISIL)  As of today, STOP taking any Aspirin (unless otherwise instructed by your surgeon) Aleve, Naproxen, Ibuprofen, Motrin, Advil, Goody's, BC's, all herbal medications, fish oil, and all vitamins.  WHAT DO I DO ABOUT MY DIABETES MEDICATION?   Do not take oral diabetes medicines (pills) the morning of surgery.      THE MORNING OF SURGERY, do not take metFORMIN (GLUCOPHAGE XR).  The day of surgery, do not take other diabetes injectables, including Byetta (exenatide), Bydureon (exenatide ER), Victoza (liraglutide), or Trulicity  (dulaglutide).  If your CBG is greater than 220 mg/dL, you may take  of your sliding scale (correction) dose of insulin.   HOW TO MANAGE YOUR DIABETES BEFORE AND AFTER SURGERY  Why is it important to control my blood sugar before and after surgery? Improving blood sugar levels before and after surgery helps healing and can limit problems. A way of improving blood sugar control is eating a healthy diet by:  Eating less sugar and carbohydrates  Increasing activity/exercise  Talking with your doctor about reaching your blood sugar goals High blood sugars (greater than 180 mg/dL) can raise your risk of infections and slow your recovery, so you will need to focus on controlling your diabetes during the weeks before surgery. Make sure that the doctor who takes care of your diabetes knows about your planned surgery including the date and location.  How do I manage my blood sugar before surgery? Check your blood sugar at least 4 times a day, starting 2 days before surgery, to make sure that the level is not too high or low.  Check your blood sugar the morning of your surgery when you wake up and every 2 hours until you get to the Short Stay unit.  If your blood sugar is less than 70 mg/dL, you will need to treat for low blood sugar: Do not take insulin. Treat a low blood sugar (less than 70 mg/dL) with  cup of clear juice (cranberry or apple), 4 glucose tablets, OR glucose gel. Recheck blood sugar in 15 minutes after treatment (  to make sure it is greater than 70 mg/dL). If your blood sugar is not greater than 70 mg/dL on recheck, call [phone] for further instructions. Report your blood sugar to the short stay nurse when you get to Short Stay.  If you are admitted to the hospital after surgery: Your blood sugar will be checked by the staff and you will probably be given insulin after surgery (instead of oral diabetes medicines) to make sure you have good blood sugar levels. The goal for  blood sugar control after surgery is 80-180 mg/dL.           Do not wear jewelry or makeup Do not wear lotions, powders, colognes, or deodorant. Men may shave face and neck. Do not bring valuables to the hospital.  DO Not wear nail polish, gel polish, artificial nails, or any other type of covering on natural nails  including finger and toenails. If patients have artificial nails, gel coating, etc. that need to be removed by a nail salon please have this removed prior to surgery or surgery may need to be canceled/delayed if the surgeon/ anesthesia feels like the patient is unable to be adequately monitored.             [facility] is not responsible for any belongings or valuables.  Do NOT Smoke (Tobacco/Vaping) or drink Alcohol 24 hours prior to your procedure If you use a CPAP at night, you may bring all equipment for your overnight stay.   Contacts, glasses, dentures or bridgework may not be worn into surgery, please bring cases for these belongings   For patients admitted to the hospital, discharge time will be determined by your treatment team.   Patients discharged the day of surgery will not be allowed to drive home, and someone needs to stay with them for 24 hours.  ONLY 1 SUPPORT PERSON MAY BE PRESENT WHILE YOU ARE IN SURGERY. IF YOU ARE TO BE ADMITTED ONCE YOU ARE IN YOUR ROOM YOU WILL BE ALLOWED TWO (2) VISITORS.  Minor children may have two parents present. Special consideration for safety and communication needs will be reviewed on a case by case basis.  Special instructions:    Oral Hygiene is also important to reduce your risk of infection.  Remember - BRUSH YOUR TEETH THE MORNING OF SURGERY WITH YOUR REGULAR TOOTHPASTE   [place]- Preparing For Surgery  Before surgery, you can play an important role. Because skin is not sterile, your skin needs to be as free of germs as possible. You can reduce the number of germs on your skin by washing with CHG (chlorahexidine  gluconate) Soap before surgery.  CHG is an antiseptic cleaner which kills germs and bonds with the skin to continue killing germs even after washing.     Please do not use if you have an allergy to CHG or antibacterial soaps. If your skin becomes reddened/irritated stop using the CHG.  Do not shave (including legs and underarms) for at least 48 hours prior to first CHG shower. It is OK to shave your face.  Please follow these instructions carefully.     Shower the NIGHT BEFORE SURGERY and the MORNING OF SURGERY with CHG Soap.   If you chose to wash your hair, wash your hair first as usual with your normal shampoo. After you shampoo, rinse your hair and body thoroughly to remove the shampoo.  Then [employer] and genitals (private parts) with your normal soap and rinse thoroughly to remove soap.  After that Use CHG Soap as you would any other liquid soap. You can apply CHG directly to the skin and wash gently with a scrungie or a clean washcloth.   Apply the CHG Soap to your body ONLY FROM THE NECK DOWN.  Do not use on open wounds or open sores. Avoid contact with your eyes, ears, mouth and genitals (private parts). Wash Face and genitals (private parts)  with your normal soap.   Wash thoroughly, paying special attention to the area where your surgery will be performed.  Thoroughly rinse your body with warm water from the neck down.  DO NOT shower/wash with your normal soap after using and rinsing off the CHG Soap.  Pat yourself dry with a CLEAN TOWEL.  Wear CLEAN PAJAMAS to bed the night before surgery  Place CLEAN SHEETS on your bed the night before your surgery  DO NOT SLEEP WITH PETS.   Day of Surgery: Take a shower with CHG soap. Wear Clean/Comfortable clothing the morning of surgery Do not apply any deodorants/lotions.   Remember to brush your teeth WITH YOUR REGULAR TOOTHPASTE.   Please read over the following fact sheets that you were given.

## 2020-11-29 NOTE — Progress Notes (Signed)
Cardiology Office Note   Date:  11/30/2020   ID:  Scott, Tucker 1965-11-10, MRN [id number]  PCP:  Scott Sessions, NP    No chief complaint on file.  CAD  Wt Readings from Last 3 Encounters:  11/30/20 193 lb (87.5 kg)  11/30/20 192 lb 3.2 oz (87.2 kg)  11/30/20 184 lb (83.5 kg)       History of Present Illness: Scott Tucker is a 55 y.o. male  with CAD.  He has risk factors for CAD including hypertension, hyperlipidemia and diabetes.  In [place], it appears he had an angioplasty in approximately 2010.   He was admitted to [facility] in October 2020.  He had some ECG changes including T wave inversions in V5 and V6.  He had a coronary CTA showing obstructive CAD and a small OM vessel.  Positive FFR in that vessel.  Medical management done given the size of the vessel.  Troponins negative at that time.   Intolerant of ACE-I in the past due to cough.   In 2021, it was noted: "Low back pain limits walking.  Uses cane.  Had prior back surgery.  He smokes MJ, no cigarettes.  He uses for back pain.  Uses CBD oil also."  He needs neck surgery with Dr. Ophelia Tucker.  Severe right arm pain and limited ROM.   Denies : Chest pain. Dizziness. Leg edema. Nitroglycerin use. Orthopnea. Palpitations. Paroxysmal nocturnal dyspnea. Shortness of breath. Syncope.    Past Medical History:  Diagnosis Date   Anginal pain (HCC)    Anxiety    Chronic back pain    Coronary artery disease    Diabetes mellitus without complication (HCC)    type 2   Dyspnea    Headache    HTN (hypertension)    Neuromuscular disorder (HCC)     Past Surgical History:  Procedure Laterality Date   BACK SURGERY     1992 and 2003(dr kritzer)   CARDIAC CATHETERIZATION     SHOULDER SURGERY Right    2017   TONSILLECTOMY     removed as a child     Current Outpatient Medications  Medication Sig Dispense Refill   amLODipine (NORVASC) 10 MG tablet TAKE 1 TABLET (10 MG TOTAL) BY MOUTH DAILY. (Patient taking  differently: Take 10 mg by mouth daily.) 90 tablet 1   aspirin 81 MG EC tablet Take 81 mg by mouth daily.     atorvastatin (LIPITOR) 10 MG tablet TAKE 1 TABLET (10 MG TOTAL) BY MOUTH DAILY AT 6 PM. (Patient taking differently: Take 10 mg by mouth daily at 6 PM.) 90 tablet 1   FLUoxetine (PROZAC) 20 MG capsule Take 1 capsule (20 mg total) by mouth daily. 90 capsule 1   hydrOXYzine (ATARAX/VISTARIL) 25 MG tablet TAKE 1 TABLET (25 MG TOTAL) BY MOUTH AT BEDTIME AS NEEDED FOR ANXIETY. 90 tablet 0   losartan (COZAAR) 25 MG tablet Take 1 tablet (25 mg total) by mouth daily. Please make overdue appt with Dr. Eldridge Tucker before anymore refills. Thank you 1st attempt 90 tablet 1   meclizine (ANTIVERT) 12.5 MG tablet TAKE 1 TABLET (12.5 MG TOTAL) BY MOUTH 3 (THREE) TIMES DAILY AS NEEDED FOR DIZZINESS. 30 tablet 0   metFORMIN (GLUCOPHAGE XR) 500 MG 24 hr tablet Take 1 tablet (500 mg total) by mouth daily with breakfast. 90 tablet 1   methocarbamol (ROBAXIN) 750 MG tablet TAKE 1 TABLET (750 MG TOTAL) BY MOUTH 2 (TWO) TIMES DAILY AS  NEEDED FOR MUSCLE SPASMS. 60 tablet 1   nitroGLYCERIN (NITROSTAT) 0.4 MG SL tablet Place 1 tablet (0.4 mg total) under the tongue every 5 (five) minutes as needed for chest pain. 75 tablet 1   pregabalin (LYRICA) 75 MG capsule TAKE 1 CAPSULE (75 MG TOTAL) BY MOUTH 2 (TWO) TIMES DAILY. 60 capsule 1   terbinafine (LAMISIL) 250 MG tablet Take 1 tablet by mouth daily 30 tablet 1   cephALEXin (KEFLEX) 500 MG capsule Take 1 capsule (500 mg total) by mouth 2 (two) times daily. (Patient not taking: No sig reported) 10 capsule 0   ibuprofen (ADVIL) 200 MG tablet Take 400 mg by mouth every 6 (six) hours as needed for headache. (Patient not taking: No sig reported)     No current facility-administered medications for this visit.    Allergies:   Latex    Social History:  The patient  reports that he has never smoked. He has never used smokeless tobacco. He reports current alcohol use. He reports  current drug use. Drug: Marijuana.   Family History:  The patient's family history includes Heart disease in his maternal uncle.    ROS:  Please see the history of present illness.   Otherwise, review of systems are positive for right arm pain.   All other systems are reviewed and negative.    PHYSICAL EXAM: VS:  BP 130/74   Pulse 81   Ht 5\' 6"  (1.676 m)   Wt 193 lb (87.5 kg)   SpO2 96%   BMI 31.15 kg/m  , BMI Body mass index is 31.15 kg/m. GEN: Well nourished, well developed, in no acute distress HEENT: normal Neck: no JVD, carotid bruits, or masses Cardiac: RRR; no murmurs, rubs, or gallops,no edema  Respiratory:  clear to auscultation bilaterally, normal work of breathing GI: soft, nontender, nondistended, + BS MS: no deformity or atrophy Skin: warm and dry, no rash Neuro:  Strength and sensation are intact Psych: euthymic mood, full affect   EKG:   The ekg ordered today demonstrates normal sinus rhythm, nonspecific ST-T wave changes, no significant change from November 2021 ECG   Recent Labs: 11/30/2020: ALT 38; BUN 9; Creatinine, Ser 0.63; Hemoglobin 14.3; Platelets 380; Potassium 4.0; Sodium 139   Lipid Panel    Component Value Date/Time   CHOL 138 07/27/2020 1128   TRIG 69 07/27/2020 1128   HDL 46 07/27/2020 1128   CHOLHDL 3.0 07/27/2020 1128   CHOLHDL 3.7 02/23/2019 0405   VLDL 11 02/23/2019 0405   LDLCALC 78 07/27/2020 1128     Other studies Reviewed: Additional studies/ records that were reviewed today with results demonstrating: Labs reviewed as below.  CT scan result reviewed as above.   ASSESSMENT AND PLAN:  CAD: No angina on medical therapy.  Known small vessel disease by CTA coronaries.  Continue aggressive secondary prevention.  Hopefully, he will be able to exercise more after his neck surgery. HTN: The current medical regimen is effective;  continue present plan and medications. Hyperlipidemia: LDL 78. Continue atorvastatin.  Whole food,  plant-based diet. Type 2 DM: A1C 6.1 on metformin.  Increase fiber intake. Preoperative cardiovascular exam: No further cardiac testing needed before surgery.  He is stable from a cardiac standpoint.  Okay to hold aspirin 7 days prior if needed.   Current medicines are reviewed at length with the patient today.  The patient concerns regarding his medicines were addressed.  The following changes have been made:  No change  Labs/ tests ordered  today include:  No orders of the defined types were placed in this encounter.   Recommend 150 minutes/week of aerobic exercise Low fat, low carb, high fiber diet recommended  Disposition:   FU in 1 year, or sooner if he has symptoms.  He thinks he may need another cardiac clearance in the fairly near future.   Signed, Lance Muss, MD  11/30/2020 3:08 PM    [facility] [address] [place], [place], [place]  [address] Phone: [phone]; Fax: [phone]

## 2020-11-29 NOTE — Telephone Encounter (Signed)
I spoke with patient and scheduled him to see Dr Eldridge Dace on 11/30/20 at 2:40. Appointment information left on voicemail for [employer].

## 2020-11-30 ENCOUNTER — Ambulatory Visit: Payer: Medicaid Other | Admitting: Interventional Cardiology

## 2020-11-30 ENCOUNTER — Encounter (HOSPITAL_COMMUNITY): Payer: Self-pay

## 2020-11-30 ENCOUNTER — Encounter: Payer: Self-pay | Admitting: Interventional Cardiology

## 2020-11-30 ENCOUNTER — Ambulatory Visit (INDEPENDENT_AMBULATORY_CARE_PROVIDER_SITE_OTHER): Payer: Medicaid Other | Admitting: Surgery

## 2020-11-30 ENCOUNTER — Encounter (HOSPITAL_COMMUNITY)
Admission: RE | Admit: 2020-11-30 | Discharge: 2020-11-30 | Disposition: A | Discharge status: Home / Self Care | Payer: Medicaid Other | Source: Ambulatory Visit | Attending: Orthopaedic Surgery | Admitting: Orthopaedic Surgery

## 2020-11-30 ENCOUNTER — Other Ambulatory Visit: Payer: Self-pay

## 2020-11-30 ENCOUNTER — Encounter: Payer: Self-pay | Admitting: Surgery

## 2020-11-30 VITALS — BP 128/81 | HR 77 | Ht 66.0 in | Wt 184.0 lb

## 2020-11-30 VITALS — BP 130/74 | HR 81 | Ht 66.0 in | Wt 193.0 lb

## 2020-11-30 DIAGNOSIS — I2510 Atherosclerotic heart disease of native coronary artery without angina pectoris: Secondary | ICD-10-CM

## 2020-11-30 DIAGNOSIS — I10 Essential (primary) hypertension: Secondary | ICD-10-CM | POA: Diagnosis not present

## 2020-11-30 DIAGNOSIS — M4802 Spinal stenosis, cervical region: Secondary | ICD-10-CM

## 2020-11-30 DIAGNOSIS — Z01818 Encounter for other preprocedural examination: Secondary | ICD-10-CM | POA: Insufficient documentation

## 2020-11-30 DIAGNOSIS — E119 Type 2 diabetes mellitus without complications: Secondary | ICD-10-CM | POA: Diagnosis not present

## 2020-11-30 DIAGNOSIS — E782 Mixed hyperlipidemia: Secondary | ICD-10-CM | POA: Diagnosis not present

## 2020-11-30 DIAGNOSIS — Z20822 Contact with and (suspected) exposure to covid-19: Secondary | ICD-10-CM | POA: Insufficient documentation

## 2020-11-30 HISTORY — DX: Dyspnea, unspecified: R06.00

## 2020-11-30 HISTORY — DX: Myoneural disorder, unspecified: G70.9

## 2020-11-30 HISTORY — DX: Headache, unspecified: R51.9

## 2020-11-30 HISTORY — DX: Angina pectoris, unspecified: I20.9

## 2020-11-30 HISTORY — DX: Anxiety disorder, unspecified: F41.9

## 2020-11-30 LAB — COMPREHENSIVE METABOLIC PANEL
ALT: 38 U/L (ref 0–44)
AST: 24 U/L (ref 15–41)
Albumin: 4 g/dL (ref 3.5–5.0)
Alkaline Phosphatase: 80 U/L (ref 38–126)
Anion gap: 6 (ref 5–15)
BUN: 9 mg/dL (ref 6–20)
CO2: 28 mmol/L (ref 22–32)
Calcium: 9.2 mg/dL (ref 8.9–10.3)
Chloride: 105 mmol/L (ref 98–111)
Creatinine, Ser: 0.63 mg/dL (ref 0.61–1.24)
GFR, Estimated: >60 mL/min (ref >60)
Glucose, Bld: 148 mg/dL — ABNORMAL HIGH (ref 70–99)
Potassium: 4 mmol/L (ref 3.5–5.1)
Sodium: 139 mmol/L (ref 135–145)
Total Bilirubin: 0.7 mg/dL (ref 0.3–1.2)
Total Protein: 6.8 g/dL (ref 6.5–8.1)

## 2020-11-30 LAB — CBC
HCT: 41.4 % (ref 39.0–52.0)
Hemoglobin: 14.3 g/dL (ref 13.0–17.0)
MCH: 31.6 pg (ref 26.0–34.0)
MCHC: 34.5 g/dL (ref 30.0–36.0)
MCV: 91.4 fL (ref 80.0–100.0)
Platelets: 380 10*3/uL (ref 150–400)
RBC: 4.53 MIL/uL (ref 4.22–5.81)
RDW: 13.3 % (ref 11.5–15.5)
WBC: 9.8 10*3/uL (ref 4.0–10.5)
nRBC: 0 % (ref 0.0–0.2)

## 2020-11-30 LAB — SARS CORONAVIRUS 2 (TAT 6-24 HRS): SARS Coronavirus 2: NEGATIVE

## 2020-11-30 LAB — SURGICAL PCR SCREEN
MRSA, PCR: NEGATIVE
Staphylococcus aureus: NEGATIVE

## 2020-11-30 LAB — GLUCOSE, CAPILLARY: Glucose-Capillary: 155 mg/dL — ABNORMAL HIGH (ref 70–99)

## 2020-11-30 NOTE — Progress Notes (Signed)
PCP - Gwinda Passe, FNP Cardiologist - Lance Muss (first appt is  11/30/20 for pre op clearance)  PPM/ICD - denies  Chest x-ray - n/a EKG - 11/30/20 Stress Test - over 10 years ago, records requested from [facility] ECHO - 02/22/19 Cardiac Cath - over 10 years ago, records requested from [facility]  Sleep Study - denies   Fasting Blood Sugar - unknown Does not check blood sugar at home  As of today, STOP taking any Aspirin (unless otherwise instructed by your surgeon) Aleve, Naproxen, Ibuprofen, Motrin, Advil, Goody's, BC's, all herbal medications, fish oil, and all vitamins.  ERAS Protcol -yes PRE-SURGERY Ensure or G2- g2 given  COVID TEST- 11/30/20 in PAT   Anesthesia review: yes. Records requested from [facility] for notes, cardiac cath and stress test. Has an appt with cardiologist today for pre op clearance  Patient denies shortness of breath, fever, cough and chest pain at PAT appointment   All instructions explained to the patient, with a verbal understanding of the material. Patient agrees to go over the instructions while at home for a better understanding. Patient also instructed to self quarantine after being tested for COVID-19. The opportunity to ask questions was provided.

## 2020-11-30 NOTE — Patient Instructions (Signed)

## 2020-12-01 ENCOUNTER — Telehealth (INDEPENDENT_AMBULATORY_CARE_PROVIDER_SITE_OTHER): Payer: Self-pay

## 2020-12-01 NOTE — Progress Notes (Signed)
Anesthesia Chart Review:  Follows with cardiology for history of CAD s/p angioplasty approximately 2010 in [place], [place]. He was admitted to [facility] in October 2020.  He had some ECG changes including T wave inversions in V5 and V6.  He had a coronary CTA showing obstructive CAD and a small OM vessel.  Positive FFR in that vessel.  Medical management done given the size of the vessel.  Troponins negative at that time.  Last seen by Dr. Eldridge Dace 11/30/2020 for preop evaluation.  Per note, "Preoperative cardiovascular exam: No further cardiac testing needed before surgery.  He is stable from a cardiac standpoint.  Okay to hold aspirin 7 days prior if needed."  CXR 03/13/2020 showed chronic elevation of the right hemidiaphragm.  DM2 well-controlled, last A1c 6.1 on 10/27/2020.  Preop labs reviewed, unremarkable.  EKG 11/30/2020: normal sinus rhythm, nonspecific ST-T wave changes, no significant change from November 2021 ECG  CHEST - 2 VIEW 03/13/2020: COMPARISON:  06/24/2019   FINDINGS: Frontal and lateral views of the chest demonstrate an unremarkable cardiac silhouette. Chronic elevation of the right hemidiaphragm. No airspace disease, effusion, or pneumothorax. No acute bony abnormalities.   IMPRESSION: 1. No acute intrathoracic process.  TTE 02/22/2019:  1. Left ventricular ejection fraction, by visual estimation, is 55 to  60%. The left ventricle has normal function. Normal left ventricular size.  There is mildly increased left ventricular hypertrophy.   2. Global right ventricle has normal systolic function.The right  ventricular size is normal.   3. Left atrial size was normal.   4. Right atrial size was normal.   5. The mitral valve is normal in structure. Trace mitral valve  regurgitation. No evidence of mitral stenosis.   6. The tricuspid valve is normal in structure. Tricuspid valve  regurgitation is trivial.   7. The aortic valve is tricuspid Aortic valve  regurgitation was not  visualized by color flow Doppler. Structurally normal aortic valve, with  no evidence of sclerosis or stenosis.   8. The pulmonic valve was normal in structure. Pulmonic valve  regurgitation is not visualized by color flow Doppler.   9. The inferior vena cava is normal in size with greater than 50%  respiratory variability, suggesting right atrial pressure of 3 mmHg.  10. Normal LV systolic and diastolic function.   Coronary CT with FFR analysis 02/22/2019: IMPRESSION: 1. CT FFR analysis shows significant stenosis in OM1 vessel with FFR 0.72. This is a small caliber vessel, recommend medical management but consider PCI if there are refractory symptoms.   [facility] Short Stay Center/Anesthesiology Phone [phone] 12/01/2020 9:43 AM

## 2020-12-01 NOTE — Anesthesia Preprocedure Evaluation (Addendum)
Anesthesia Evaluation  Patient identified by MRN, date of birth, ID band Patient awake    Reviewed: Allergy & Precautions, NPO status , Patient's Chart, lab work & pertinent test results  Airway Mallampati: III  TM Distance: >3 FB Neck ROM: Full    Dental  (+) Teeth Intact, Dental Advisory Given, Poor Dentition   Pulmonary Patient abstained from smoking.,    breath sounds clear to auscultation       Cardiovascular hypertension, Pt. on medications + CAD   Rhythm:Regular Rate:Normal     Neuro/Psych  Headaches, Anxiety  Neuromuscular disease    GI/Hepatic negative GI ROS, Neg liver ROS,   Endo/Other  diabetes, Type 2, Oral Hypoglycemic Agents  Renal/GU      Musculoskeletal negative musculoskeletal ROS (+)   Abdominal Normal abdominal exam  (+)   Peds  Hematology negative hematology ROS (+)   Anesthesia Other Findings R sided pain, B/l hand numbness  Reproductive/Obstetrics                           Anesthesia Physical Anesthesia Plan  ASA: 2  Anesthesia Plan: General   Post-op Pain Management:    Induction: Intravenous  PONV Risk Score and Plan: 2 and Ondansetron, Dexamethasone and Midazolam  Airway Management Planned: Oral ETT and Video Laryngoscope Planned  Additional Equipment: None  Intra-op Plan:   Post-operative Plan: Extubation in OR  Informed Consent: I have reviewed the patients History and Physical, chart, labs and discussed the procedure including the risks, benefits and alternatives for the proposed anesthesia with the patient or authorized representative who has indicated his/her understanding and acceptance.     Dental advisory given  Plan Discussed with: CRNA  Anesthesia Plan Comments: (PAT note by Antionette Poles, PA-C: Follows with cardiology for history of CAD s/p angioplasty approximately 2010 in [place], [place]. He was admitted to [facility] in October  2020. He had some ECG changes including T wave inversions in V5 and V6. He had a coronary CTA showing obstructive CAD and a small OM vessel. Positive FFR in that vessel. Medical management done given the size of the vessel. Troponins negative at that time.  Last seen by Dr. Eldridge Dace 11/30/2020 for preop evaluation.  Per note, "Preoperative cardiovascular exam:No further cardiac testing needed before surgery. He is stable from a cardiac standpoint. Okay to hold aspirin 7 days prior if needed."  CXR 03/13/2020 showed chronic elevation of the right hemidiaphragm.  DM2 well-controlled, last A1c 6.1 on 10/27/2020.  Preop labs reviewed, unremarkable.  EKG 11/30/2020: normal sinus rhythm, nonspecific ST-T wave changes, no significant change from November 2021 ECG  CHEST - 2 VIEW 03/13/2020: COMPARISON: 06/24/2019  FINDINGS: Frontal and lateral views of the chest demonstrate an unremarkable cardiac silhouette. Chronic elevation of the right hemidiaphragm. No airspace disease, effusion, or pneumothorax. No acute bony abnormalities.  IMPRESSION: 1. No acute intrathoracic process.  TTE 02/22/2019: 1. Left ventricular ejection fraction, by visual estimation, is 55 to  60%. The left ventricle has normal function. Normal left ventricular size.  There is mildly increased left ventricular hypertrophy.  2. Global right ventricle has normal systolic function.The right  ventricular size is normal.  3. Left atrial size was normal.  4. Right atrial size was normal.  5. The mitral valve is normal in structure. Trace mitral valve  regurgitation. No evidence of mitral stenosis.  6. The tricuspid valve is normal in structure. Tricuspid valve  regurgitation is trivial.  7. The aortic  valve is tricuspid Aortic valve regurgitation was not  visualized by color flow Doppler. Structurally normal aortic valve, with  no evidence of sclerosis or stenosis.  8. The pulmonic valve was normal in  structure. Pulmonic valve  regurgitation is not visualized by color flow Doppler.  9. The inferior vena cava is normal in size with greater than 50%  respiratory variability, suggesting right atrial pressure of 3 mmHg.  10. Normal LV systolic and diastolic function.   Coronary CT with FFR analysis 02/22/2019: IMPRESSION: 1. CT FFR analysis shows significant stenosis in OM1 vessel with FFR 0.72. This is a small caliber vessel, recommend medical management but consider PCI if there are refractory symptoms.  )      Anesthesia Quick Evaluation

## 2020-12-01 NOTE — Telephone Encounter (Signed)
Copied from CRM [id number]. Topic: General - Other >> Nov 30, 2020  2:28 PM Jaquita Rector A wrote: Reason for CRM: Patient caregiver/ friend Carlos American called in to Speak to Gwinda Passe say that he will be having surgery on 12/04/20 and will need someone to care for him at home to help with meal prep and other small things after the surgery. Also states that patient will need a walker.  Asking if Gwinda Passe can give a call to discuss how to go about this Ph# [phone]  Please advise

## 2020-12-04 ENCOUNTER — Encounter (HOSPITAL_COMMUNITY): Payer: Self-pay | Admitting: Orthopaedic Surgery

## 2020-12-04 ENCOUNTER — Ambulatory Visit (HOSPITAL_COMMUNITY): Payer: Medicaid Other | Admitting: Anesthesiology

## 2020-12-04 ENCOUNTER — Ambulatory Visit (HOSPITAL_COMMUNITY): Payer: Medicaid Other

## 2020-12-04 ENCOUNTER — Other Ambulatory Visit: Payer: Self-pay

## 2020-12-04 ENCOUNTER — Observation Stay (HOSPITAL_COMMUNITY)
Admission: RE | Admit: 2020-12-04 | Discharge: 2020-12-05 | Disposition: A | Discharge status: Home / Self Care | Payer: Medicaid Other | Attending: Orthopaedic Surgery | Admitting: Orthopaedic Surgery

## 2020-12-04 ENCOUNTER — Ambulatory Visit (HOSPITAL_COMMUNITY): Payer: Medicaid Other | Admitting: Physician Assistant

## 2020-12-04 ENCOUNTER — Encounter (HOSPITAL_COMMUNITY)
Admission: RE | Disposition: A | Discharge status: Home / Self Care | Payer: Self-pay | Source: Home / Self Care | Attending: Orthopaedic Surgery

## 2020-12-04 DIAGNOSIS — I10 Essential (primary) hypertension: Secondary | ICD-10-CM | POA: Insufficient documentation

## 2020-12-04 DIAGNOSIS — M4802 Spinal stenosis, cervical region: Secondary | ICD-10-CM | POA: Diagnosis not present

## 2020-12-04 DIAGNOSIS — I2510 Atherosclerotic heart disease of native coronary artery without angina pectoris: Secondary | ICD-10-CM | POA: Insufficient documentation

## 2020-12-04 DIAGNOSIS — G959 Disease of spinal cord, unspecified: Secondary | ICD-10-CM | POA: Diagnosis present

## 2020-12-04 DIAGNOSIS — M48061 Spinal stenosis, lumbar region without neurogenic claudication: Principal | ICD-10-CM | POA: Insufficient documentation

## 2020-12-04 DIAGNOSIS — E785 Hyperlipidemia, unspecified: Secondary | ICD-10-CM

## 2020-12-04 DIAGNOSIS — Z419 Encounter for procedure for purposes other than remedying health state, unspecified: Secondary | ICD-10-CM

## 2020-12-04 DIAGNOSIS — M5412 Radiculopathy, cervical region: Secondary | ICD-10-CM | POA: Diagnosis present

## 2020-12-04 DIAGNOSIS — M4322 Fusion of spine, cervical region: Secondary | ICD-10-CM | POA: Diagnosis not present

## 2020-12-04 DIAGNOSIS — E119 Type 2 diabetes mellitus without complications: Secondary | ICD-10-CM | POA: Diagnosis not present

## 2020-12-04 DIAGNOSIS — Z7982 Long term (current) use of aspirin: Secondary | ICD-10-CM | POA: Diagnosis not present

## 2020-12-04 DIAGNOSIS — Z7984 Long term (current) use of oral hypoglycemic drugs: Secondary | ICD-10-CM | POA: Diagnosis not present

## 2020-12-04 DIAGNOSIS — Z981 Arthrodesis status: Secondary | ICD-10-CM | POA: Diagnosis not present

## 2020-12-04 DIAGNOSIS — Z91040 Latex allergy status: Secondary | ICD-10-CM | POA: Insufficient documentation

## 2020-12-04 DIAGNOSIS — M50121 Cervical disc disorder at C4-C5 level with radiculopathy: Secondary | ICD-10-CM | POA: Insufficient documentation

## 2020-12-04 DIAGNOSIS — Z79899 Other long term (current) drug therapy: Secondary | ICD-10-CM | POA: Insufficient documentation

## 2020-12-04 HISTORY — PX: ANTERIOR CERVICAL DECOMP/DISCECTOMY FUSION: SHX1161

## 2020-12-04 LAB — GLUCOSE, CAPILLARY
Glucose-Capillary: 151 mg/dL — ABNORMAL HIGH (ref 70–99)
Glucose-Capillary: 208 mg/dL — ABNORMAL HIGH (ref 70–99)
Glucose-Capillary: 205 mg/dL — ABNORMAL HIGH (ref 70–99)
Glucose-Capillary: 236 mg/dL — ABNORMAL HIGH (ref 70–99)

## 2020-12-04 SURGERY — ANTERIOR CERVICAL DECOMPRESSION/DISCECTOMY FUSION 2 LEVELS
Anesthesia: General | Site: Neck

## 2020-12-04 MED ORDER — FLUOXETINE HCL 20 MG PO CAPS
20.0000 mg | ORAL_CAPSULE | Freq: Every day | ORAL | Status: DC
  Administered 2020-12-05: 20 mg via ORAL
  Filled 2020-12-04: qty 1

## 2020-12-04 MED ORDER — BUPIVACAINE HCL (PF) 0.25 % IJ SOLN
INTRAMUSCULAR | Status: AC
  Filled 2020-12-04: qty 30

## 2020-12-04 MED ORDER — ALBUMIN HUMAN 5 % IV SOLN: INTRAVENOUS | Status: DC | PRN

## 2020-12-04 MED ORDER — SODIUM CHLORIDE 0.45 % IV SOLN: INTRAVENOUS | Status: DC

## 2020-12-04 MED ORDER — MENTHOL 3 MG MT LOZG: 1.0000 | LOZENGE | OROMUCOSAL | Status: DC | PRN

## 2020-12-04 MED ORDER — ROCURONIUM BROMIDE 10 MG/ML (PF) SYRINGE
PREFILLED_SYRINGE | INTRAVENOUS | Status: AC
  Filled 2020-12-04: qty 10

## 2020-12-04 MED ORDER — ONDANSETRON HCL 4 MG PO TABS: 4.0000 mg | ORAL_TABLET | Freq: Four times a day (QID) | ORAL | Status: DC | PRN

## 2020-12-04 MED ORDER — FENTANYL CITRATE (PF) 250 MCG/5ML IJ SOLN
INTRAMUSCULAR | Status: AC
  Filled 2020-12-04: qty 5

## 2020-12-04 MED ORDER — KETOROLAC TROMETHAMINE 30 MG/ML IJ SOLN
INTRAMUSCULAR | Status: AC
  Filled 2020-12-04: qty 1

## 2020-12-04 MED ORDER — LOSARTAN POTASSIUM 25 MG PO TABS
25.0000 mg | ORAL_TABLET | Freq: Every day | ORAL | Status: DC
  Administered 2020-12-04 – 2020-12-05 (×2): 25 mg via ORAL
  Filled 2020-12-04 (×2): qty 1

## 2020-12-04 MED ORDER — METHOCARBAMOL 500 MG PO TABS
500.0000 mg | ORAL_TABLET | Freq: Four times a day (QID) | ORAL | Status: DC | PRN
  Administered 2020-12-04 – 2020-12-05 (×4): 500 mg via ORAL
  Filled 2020-12-04 (×4): qty 1

## 2020-12-04 MED ORDER — 0.9 % SODIUM CHLORIDE (POUR BTL) OPTIME
TOPICAL | Status: DC | PRN
  Administered 2020-12-04: 1000 mL

## 2020-12-04 MED ORDER — SODIUM CHLORIDE 0.9% FLUSH: 3.0000 mL | INTRAVENOUS | Status: DC | PRN

## 2020-12-04 MED ORDER — POTASSIUM CHLORIDE IN NACL 20-0.45 MEQ/L-% IV SOLN
INTRAVENOUS | Status: DC
  Filled 2020-12-04: qty 1000

## 2020-12-04 MED ORDER — HEMOSTATIC AGENTS (NO CHARGE) OPTIME
TOPICAL | Status: DC | PRN
Start: 2020-12-04 — End: 2020-12-04
  Administered 2020-12-04 (×2): 1 via TOPICAL

## 2020-12-04 MED ORDER — PREGABALIN 75 MG PO CAPS: 75.0000 mg | ORAL_CAPSULE | Freq: Two times a day (BID) | ORAL | Status: DC

## 2020-12-04 MED ORDER — PROPOFOL 10 MG/ML IV BOLUS
INTRAVENOUS | Status: DC | PRN
  Administered 2020-12-04: 150 mg via INTRAVENOUS

## 2020-12-04 MED ORDER — MEPERIDINE HCL 25 MG/ML IJ SOLN: 6.2500 mg | INTRAMUSCULAR | Status: DC | PRN

## 2020-12-04 MED ORDER — AMLODIPINE BESYLATE 10 MG PO TABS
10.0000 mg | ORAL_TABLET | Freq: Every day | ORAL | Status: DC
  Administered 2020-12-04 – 2020-12-05 (×2): 10 mg via ORAL
  Filled 2020-12-04 (×2): qty 1
  Filled 2020-12-04 (×2): qty 2

## 2020-12-04 MED ORDER — SUGAMMADEX SODIUM 500 MG/5ML IV SOLN
INTRAVENOUS | Status: DC | PRN
  Administered 2020-12-04: 400 mg via INTRAVENOUS

## 2020-12-04 MED ORDER — ACETAMINOPHEN 325 MG PO TABS: 325.0000 mg | ORAL_TABLET | Freq: Once | ORAL | Status: DC | PRN

## 2020-12-04 MED ORDER — POLYETHYLENE GLYCOL 3350 17 G PO PACK: 17.0000 g | PACK | Freq: Every day | ORAL | Status: DC | PRN

## 2020-12-04 MED ORDER — ACETAMINOPHEN 650 MG RE SUPP: 650.0000 mg | RECTAL | Status: DC | PRN

## 2020-12-04 MED ORDER — DEXAMETHASONE SODIUM PHOSPHATE 10 MG/ML IJ SOLN
INTRAMUSCULAR | Status: DC | PRN
  Administered 2020-12-04 (×2): 10 mg via INTRAVENOUS

## 2020-12-04 MED ORDER — ONDANSETRON HCL 4 MG/2ML IJ SOLN
INTRAMUSCULAR | Status: AC
  Filled 2020-12-04: qty 2

## 2020-12-04 MED ORDER — ASPIRIN EC 81 MG PO TBEC
81.0000 mg | DELAYED_RELEASE_TABLET | Freq: Every day | ORAL | Status: DC
  Administered 2020-12-05: 81 mg via ORAL
  Filled 2020-12-04: qty 1

## 2020-12-04 MED ORDER — PROMETHAZINE HCL 25 MG/ML IJ SOLN: 6.2500 mg | INTRAMUSCULAR | Status: DC | PRN

## 2020-12-04 MED ORDER — FENTANYL CITRATE (PF) 100 MCG/2ML IJ SOLN
INTRAMUSCULAR | Status: DC | PRN
  Administered 2020-12-04: 50 ug via INTRAVENOUS
  Administered 2020-12-04: 100 ug via INTRAVENOUS
  Administered 2020-12-04: 50 ug via INTRAVENOUS
  Administered 2020-12-04: 100 ug via INTRAVENOUS
  Administered 2020-12-04: 50 ug via INTRAVENOUS
  Administered 2020-12-04: 150 ug via INTRAVENOUS

## 2020-12-04 MED ORDER — ACETAMINOPHEN 10 MG/ML IV SOLN: 1000.0000 mg | Freq: Once | INTRAVENOUS | Status: DC | PRN

## 2020-12-04 MED ORDER — MIDAZOLAM HCL 2 MG/2ML IJ SOLN
INTRAMUSCULAR | Status: AC
  Filled 2020-12-04: qty 2

## 2020-12-04 MED ORDER — PROPOFOL 10 MG/ML IV BOLUS
INTRAVENOUS | Status: AC
  Filled 2020-12-04: qty 20

## 2020-12-04 MED ORDER — OXYCODONE-ACETAMINOPHEN 5-325 MG PO TABS
1.0000 | ORAL_TABLET | ORAL | Status: DC | PRN
  Administered 2020-12-04 – 2020-12-05 (×4): 2 via ORAL
  Filled 2020-12-04 (×4): qty 2

## 2020-12-04 MED ORDER — ORAL CARE MOUTH RINSE: 15.0000 mL | Freq: Once | OROMUCOSAL | Status: AC

## 2020-12-04 MED ORDER — ONDANSETRON HCL 4 MG/2ML IJ SOLN
INTRAMUSCULAR | Status: DC | PRN
  Administered 2020-12-04: 4 mg via INTRAVENOUS

## 2020-12-04 MED ORDER — LACTATED RINGERS IV SOLN: INTRAVENOUS | Status: DC

## 2020-12-04 MED ORDER — KETOROLAC TROMETHAMINE 30 MG/ML IJ SOLN
30.0000 mg | Freq: Once | INTRAMUSCULAR | Status: AC
  Administered 2020-12-04: 30 mg via INTRAVENOUS

## 2020-12-04 MED ORDER — PHENYLEPHRINE 40 MCG/ML (10ML) SYRINGE FOR IV PUSH (FOR BLOOD PRESSURE SUPPORT)
PREFILLED_SYRINGE | INTRAVENOUS | Status: DC | PRN
  Administered 2020-12-04 (×8): 80 ug via INTRAVENOUS

## 2020-12-04 MED ORDER — SODIUM CHLORIDE 0.9% FLUSH
3.0000 mL | Freq: Two times a day (BID) | INTRAVENOUS | Status: DC
  Administered 2020-12-04: 3 mL via INTRAVENOUS

## 2020-12-04 MED ORDER — MIDAZOLAM HCL 5 MG/5ML IJ SOLN
INTRAMUSCULAR | Status: DC | PRN
  Administered 2020-12-04: 2 mg via INTRAVENOUS

## 2020-12-04 MED ORDER — CHLORHEXIDINE GLUCONATE 0.12 % MT SOLN: 15.0000 mL | Freq: Once | OROMUCOSAL | Status: AC

## 2020-12-04 MED ORDER — SUCCINYLCHOLINE CHLORIDE 200 MG/10ML IV SOSY
PREFILLED_SYRINGE | INTRAVENOUS | Status: DC | PRN
  Administered 2020-12-04: 120 mg via INTRAVENOUS

## 2020-12-04 MED ORDER — HYDROMORPHONE HCL 1 MG/ML IJ SOLN
0.5000 mg | INTRAMUSCULAR | Status: DC | PRN
Start: 2020-12-04 — End: 2020-12-05
  Administered 2020-12-04 (×2): 0.5 mg via INTRAVENOUS
  Filled 2020-12-04 (×2): qty 0.5

## 2020-12-04 MED ORDER — ROCURONIUM BROMIDE 100 MG/10ML IV SOLN
INTRAVENOUS | Status: DC | PRN
  Administered 2020-12-04: 70 mg via INTRAVENOUS
  Administered 2020-12-04: 20 mg via INTRAVENOUS
  Administered 2020-12-04: 30 mg via INTRAVENOUS

## 2020-12-04 MED ORDER — TERBINAFINE HCL 250 MG PO TABS
250.0000 mg | ORAL_TABLET | Freq: Every day | ORAL | Status: DC
  Administered 2020-12-05: 250 mg via ORAL
  Filled 2020-12-04: qty 1

## 2020-12-04 MED ORDER — METHOCARBAMOL 1000 MG/10ML IJ SOLN
500.0000 mg | Freq: Four times a day (QID) | INTRAVENOUS | Status: DC | PRN
  Filled 2020-12-04: qty 5

## 2020-12-04 MED ORDER — NITROGLYCERIN 0.4 MG SL SUBL: 0.4000 mg | SUBLINGUAL_TABLET | SUBLINGUAL | Status: DC | PRN

## 2020-12-04 MED ORDER — BUPIVACAINE HCL 0.25 % IJ SOLN
INTRAMUSCULAR | Status: DC | PRN
  Administered 2020-12-04: 6 mL

## 2020-12-04 MED ORDER — LIDOCAINE HCL (CARDIAC) PF 100 MG/5ML IV SOSY
PREFILLED_SYRINGE | INTRAVENOUS | Status: DC | PRN
  Administered 2020-12-04: 100 mg via INTRAVENOUS

## 2020-12-04 MED ORDER — ONDANSETRON HCL 4 MG/2ML IJ SOLN: 4.0000 mg | Freq: Four times a day (QID) | INTRAMUSCULAR | Status: DC | PRN

## 2020-12-04 MED ORDER — ACETAMINOPHEN 160 MG/5ML PO SOLN: 325.0000 mg | Freq: Once | ORAL | Status: DC | PRN

## 2020-12-04 MED ORDER — ATORVASTATIN CALCIUM 10 MG PO TABS
10.0000 mg | ORAL_TABLET | Freq: Every day | ORAL | Status: DC
  Administered 2020-12-04: 10 mg via ORAL
  Filled 2020-12-04: qty 1

## 2020-12-04 MED ORDER — EPHEDRINE SULFATE 50 MG/ML IJ SOLN
INTRAMUSCULAR | Status: DC | PRN
  Administered 2020-12-04 (×3): 5 mg via INTRAVENOUS

## 2020-12-04 MED ORDER — AMISULPRIDE (ANTIEMETIC) 5 MG/2ML IV SOLN: 10.0000 mg | Freq: Once | INTRAVENOUS | Status: DC | PRN

## 2020-12-04 MED ORDER — SODIUM CHLORIDE 0.9 % IV SOLN
250.0000 mL | INTRAVENOUS | Status: DC
  Administered 2020-12-04: 250 mL via INTRAVENOUS

## 2020-12-04 MED ORDER — SUCCINYLCHOLINE CHLORIDE 200 MG/10ML IV SOSY
PREFILLED_SYRINGE | INTRAVENOUS | Status: AC
  Filled 2020-12-04: qty 10

## 2020-12-04 MED ORDER — EPHEDRINE 5 MG/ML INJ
INTRAVENOUS | Status: AC
  Filled 2020-12-04: qty 5

## 2020-12-04 MED ORDER — PHENOL 1.4 % MT LIQD: 1.0000 | OROMUCOSAL | Status: DC | PRN

## 2020-12-04 MED ORDER — PHENYLEPHRINE 40 MCG/ML (10ML) SYRINGE FOR IV PUSH (FOR BLOOD PRESSURE SUPPORT)
PREFILLED_SYRINGE | INTRAVENOUS | Status: AC
  Filled 2020-12-04: qty 20

## 2020-12-04 MED ORDER — METFORMIN HCL ER 500 MG PO TB24
500.0000 mg | ORAL_TABLET | Freq: Every day | ORAL | Status: DC
  Administered 2020-12-05: 500 mg via ORAL
  Filled 2020-12-04: qty 1

## 2020-12-04 MED ORDER — ACETAMINOPHEN 325 MG PO TABS: 650.0000 mg | ORAL_TABLET | ORAL | Status: DC | PRN

## 2020-12-04 MED ORDER — CHLORHEXIDINE GLUCONATE 0.12 % MT SOLN
OROMUCOSAL | Status: AC
  Administered 2020-12-04: 15 mL via OROMUCOSAL
  Filled 2020-12-04: qty 15

## 2020-12-04 MED ORDER — DEXMEDETOMIDINE (PRECEDEX) IN NS 20 MCG/5ML (4 MCG/ML) IV SYRINGE
PREFILLED_SYRINGE | INTRAVENOUS | Status: AC
  Filled 2020-12-04: qty 5

## 2020-12-04 MED ORDER — DEXMEDETOMIDINE (PRECEDEX) IN NS 20 MCG/5ML (4 MCG/ML) IV SYRINGE
PREFILLED_SYRINGE | INTRAVENOUS | Status: DC | PRN
  Administered 2020-12-04: 20 ug via INTRAVENOUS

## 2020-12-04 MED ORDER — CEFAZOLIN SODIUM-DEXTROSE 2-4 GM/100ML-% IV SOLN
2.0000 g | INTRAVENOUS | Status: AC
  Administered 2020-12-04: 2 g via INTRAVENOUS
  Filled 2020-12-04: qty 100

## 2020-12-04 MED ORDER — HYDROMORPHONE HCL 1 MG/ML IJ SOLN: 0.2500 mg | INTRAMUSCULAR | Status: DC | PRN

## 2020-12-04 MED ORDER — DEXAMETHASONE SODIUM PHOSPHATE 10 MG/ML IJ SOLN
INTRAMUSCULAR | Status: AC
  Filled 2020-12-04: qty 1

## 2020-12-04 MED ORDER — LIDOCAINE 2% (20 MG/ML) 5 ML SYRINGE
INTRAMUSCULAR | Status: AC
  Filled 2020-12-04: qty 5

## 2020-12-04 SURGICAL SUPPLY — 61 items
AGENT HMST KT MTR STRL THRMB (HEMOSTASIS) ×2
APL SKNCLS STERI-STRIP NONHPOA (GAUZE/BANDAGES/DRESSINGS) ×1
BAG COUNTER SPONGE SURGICOUNT (BAG) ×2 IMPLANT
BAG SPNG CNTER NS LX DISP (BAG) ×1
BENZOIN TINCTURE PRP APPL 2/3 (GAUZE/BANDAGES/DRESSINGS) ×2 IMPLANT
BIT DRILL SMALL W/STOP 14 (BIT) ×1 IMPLANT
BLADE CLIPPER SURG (BLADE) IMPLANT
BONE CC-ACS 11X14 X8 6D (Bone Implant) ×2 IMPLANT
BONE CC-ACS 11X14X9 6D (Bone Implant) ×2 IMPLANT
BUR ROUND FLUTED 4 SOFT TCH (BURR) ×2 IMPLANT
CHIPS BONE CANC-ACS 11X14X8 6D (Bone Implant) IMPLANT
CHIPS BONE CANC-ACS11X14X9 6D (Bone Implant) IMPLANT
COLLAR CERV LO CONTOUR FIRM DE (SOFTGOODS) ×1 IMPLANT
CORD BIPOLAR FORCEPS 12FT (ELECTRODE) ×2 IMPLANT
COVER SURGICAL LIGHT HANDLE (MISCELLANEOUS) ×2 IMPLANT
DRAPE C-ARM 42X72 X-RAY (DRAPES) ×2 IMPLANT
DRAPE HALF SHEET 40X57 (DRAPES) ×2 IMPLANT
DRAPE MICROSCOPE LEICA (MISCELLANEOUS) ×2 IMPLANT
DURAPREP 6ML APPLICATOR 50/CS (WOUND CARE) ×2 IMPLANT
ELECT COATED BLADE 2.86 ST (ELECTRODE) ×2 IMPLANT
ELECT REM PT RETURN 9FT ADLT (ELECTROSURGICAL) ×2
ELECTRODE REM PT RTRN 9FT ADLT (ELECTROSURGICAL) ×1 IMPLANT
EVACUATOR 1/8 PVC DRAIN (DRAIN) ×3 IMPLANT
GAUZE SPONGE 4X4 12PLY STRL (GAUZE/BANDAGES/DRESSINGS) ×2 IMPLANT
GLOVE SRG 8 PF TXTR STRL LF DI (GLOVE) ×2 IMPLANT
GLOVE SURG ORTHO LTX SZ7.5 (GLOVE) ×4 IMPLANT
GLOVE SURG UNDER POLY LF SZ8 (GLOVE) ×4
GOWN STRL REUS W/ TWL LRG LVL3 (GOWN DISPOSABLE) ×1 IMPLANT
GOWN STRL REUS W/ TWL XL LVL3 (GOWN DISPOSABLE) ×1 IMPLANT
GOWN STRL REUS W/TWL 2XL LVL3 (GOWN DISPOSABLE) ×2 IMPLANT
GOWN STRL REUS W/TWL LRG LVL3 (GOWN DISPOSABLE) ×2
GOWN STRL REUS W/TWL XL LVL3 (GOWN DISPOSABLE) ×2
HALTER HD/CHIN CERV TRACTION D (MISCELLANEOUS) ×2 IMPLANT
HEMOSTAT SURGICEL 2X14 (HEMOSTASIS) IMPLANT
KIT BASIN OR (CUSTOM PROCEDURE TRAY) ×2 IMPLANT
KIT TURNOVER KIT B (KITS) ×2 IMPLANT
MANIFOLD NEPTUNE II (INSTRUMENTS) IMPLANT
NDL 25GX 5/8IN NON SAFETY (NEEDLE) ×1 IMPLANT
NEEDLE 25GX 5/8IN NON SAFETY (NEEDLE) ×2 IMPLANT
NS IRRIG 1000ML POUR BTL (IV SOLUTION) ×2 IMPLANT
PACK ORTHO CERVICAL (CUSTOM PROCEDURE TRAY) ×2 IMPLANT
PAD ARMBOARD 7.5X6 YLW CONV (MISCELLANEOUS) ×4 IMPLANT
PATTIES SURGICAL .5 X.5 (GAUZE/BANDAGES/DRESSINGS) ×2 IMPLANT
PIN FIXATION SMALL TEMP (ORTHOPEDIC DISPOSABLE SUPPLIES) ×1 IMPLANT
PLATE ANT CERV XTEND 2 LV (Plate) ×1 IMPLANT
POSITIONER HEAD DONUT 9IN (MISCELLANEOUS) ×2 IMPLANT
RESTRAINT LIMB HOLDER UNIV (RESTRAINTS) ×1 IMPLANT
SCREW XTD VAR 4.2 SELF TAP (Screw) ×6 IMPLANT
SPONGE T-LAP 18X18 ~~LOC~~+RFID (SPONGE) ×1 IMPLANT
SPONGE T-LAP 4X18 ~~LOC~~+RFID (SPONGE) ×2 IMPLANT
STRIP CLOSURE SKIN 1/2X4 (GAUZE/BANDAGES/DRESSINGS) ×2 IMPLANT
SURGIFLO W/THROMBIN 8M KIT (HEMOSTASIS) ×2 IMPLANT
SUT BONE WAX W31G (SUTURE) ×2 IMPLANT
SUT VIC AB 3-0 X1 27 (SUTURE) ×2 IMPLANT
SUT VICRYL 4-0 PS2 18IN ABS (SUTURE) ×4 IMPLANT
SYR CONTROL 10ML LL (SYRINGE) ×1 IMPLANT
TOWEL GREEN STERILE (TOWEL DISPOSABLE) ×2 IMPLANT
TOWEL GREEN STERILE FF (TOWEL DISPOSABLE) ×2 IMPLANT
TRAY FOLEY W/BAG SLVR 16FR (SET/KITS/TRAYS/PACK)
TRAY FOLEY W/BAG SLVR 16FR ST (SET/KITS/TRAYS/PACK) IMPLANT
YANKAUER SUCT BULB TIP NO VENT (SUCTIONS) ×1 IMPLANT

## 2020-12-04 NOTE — Op Note (Signed)
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  Postop diagnosis: Same  Procedure: C3-4, C4-5 anterior cervical discectomy and fusion, allograft and plate.  Surgeon: Annell Greening, MD  Assistant: April Fulp, RNFA  EBL: 100 cc  Drains: 1 Hemovac.  Implants:   Globus XTEND 36 mm plate.  Cortical cancellous allograft x2 8 mm at C3-4 9 mm at C4-5.  14 mm screws x6.  Procedure: After standard prepping and draping with the intubation using glide scope had ultra traction without weights arm tucked at the side with padding over the ulnar nerve calf pumpers wrist restraints were applied in case pulldown was needed for fluoroscopic visualization Intra-Op but did not have to be used.  Neck was prepped with DuraPrep there is squared with towel sterile skin marker Betadine Steri-Drape sterile Mayo stand the head thyroid sheets and drapes.  Patient had C3-4 severe stenosis changes on T2 in the cord at that level and severe stenosis at C4-5.  He also had some foraminal stenosis C5-6 and C6-7 but his most severe central compression was at the 2 operative level C3-4 and C4-5.  Extensive discussion had been made with patient that he might require further surgery at some point down the road but had been falling several times a month had to walk with a cane had myelopathic gait pattern and bilateral lower whole hand upper extremity numbness and bilateral weakness slightly worse on the right than left side.  After timeout procedure Ancef prophylaxis incision was started at the midline extending to the left platysma was divided in line with the fibers blunt dissection down to the C4-5 level short 25 needle placed crosstable C arm sterilely draped was used for visualization and once this level was confirmed chunk of disc was resected for identification we moved up 1 level to C3-4 and proceeded placing self-retaining Cloward retractor blades right and left and smooth blade cephalad  caudad.  Discectomy was performed we progressed back to the posterior longitudinal ligament numerous pieces of disc chronically had herniated through the posterior longitudinal ligament and were in small: Ovoid geometry and look like small smooth pebbles.  There carefully were removed.  Entire posterior longitudinal ligament was taken down.  Right paracentral area where there was severe cord compression this was decompressed taking extra bone cephalad since this was the area that was more tightly compressed.  Trial sizers progressed.  8 mm height was selected traction pulled by the CRNA countersunk 2 mm.  Graft was stenotic but was not too tight did not over distract the level.  Anterior spurs were then resected the graft was countersunk 2 mm.  Extra room was made prior to placing the graft right and left for an egress of fluid and some Surgi-Flo was used few times to make sure the epidural space was dry at the time of graft placement.  Identical procedure repeated at the C4-5 level.  At this level there was thickening of the posterior longitudinal ligament.  Disc protrusion overhanging spurs coming down from C4 these were resected and then we progressed trying differently placed of the problem was selected held with small prong checked under C arm AP and lateral adjusted and then screws were placed x6.  Final post instrumentation images were obtained showing good position of the graft good position of all screws and plate.  All sick screws were locked down using the tiny screwdriver.  Operative field was dry.  There is no bleeding coming from around the graft.  Copious irrigation Hemovac  drain placed within and out technique on the left side.  Platysma closed with 3 oh and skin closed 4-0 Vicryl.  Postoperatively patient was moving all extremities complained of some numbness possibly better than it was preop he is not exactly sure but had reasonable strength in all 4 extremities.  Instrument count needle count  was correct and patient was stable in the recovery room.  Postop plan is ambulation with therapy.  He will need either uses single cane or walker postop since he was a fall risk with myelopathic gait pattern.  Plan is discharge in a.m.

## 2020-12-04 NOTE — H&P (Signed)
Office Visit Note              Patient: Scott Tucker                                           Date of Birth: 1966-03-20                                                    MRN: [id number] Visit Date: 11/22/2020                                                                     Requested by: Grayce Sessions, NP [address] [place],  [place] [address] PCP: Grayce Sessions, NP     Assessment & Plan: Visit Diagnoses:  1. Spinal stenosis of cervical region  2. Foraminal stenosis of cervical region (right)       Plan: Reviewed MRI scan with patient once again and girlfriend.  He has large central disc protrusion C3-4 cord flattening myelopathic change in the cord on the right at C3-4 and severe stenosis at C4-5.  Plan two-level cervical fusion C3-4, C4-5 with allograft and plate.  Plan overnight stay.  We discussed he may not get improvement since he has core changes.  He may get some partial return and improvement.  Procedure discussed risk surgery discussed including pseudoarthrosis r,eoperation ,possible need for posterior fusion.  Questions elicited and answered he understands request to proceed.   Follow-Up Instructions: pre-op for cervical fusion   Orders:  No orders of the defined types were placed in this encounter.   No orders of the defined types were placed in this encounter.        Procedures: No procedures performed     Clinical Data: No additional findings.     Subjective:    Chief Complaint  Patient presents with   Neck - Pain      HPI 55 year old male returns with severe cervical stenosis with myelopathy, cord changes at C3-4 with diabetes now under better control with A1c on 10/27/2020 at 6.1.  Patient's had 5 falls in the last month.  He is ambulating with a custom carved cane.  Has wide spaced gait balance problems and has numbness and weakness in his hands.  He is taking Lyrica and Robaxin without relief.  MRI scan showed severe cord  compression at C3-4 and C4-5.  Mild to moderate changes at other levels but those 2 levels are severe.  Patient is here for discussion of cervical decompression and fusion for his cervical stenosis with myelopathy.   Review of SystemsPast history with problems with hypertension controlled hyperlipidemia.  Diabetes now under good control on insulin.  History of coronary artery disease no chest pain negative for MI.  He does have some lumbar stenosis at L4-5 with chronic bk pain.  Negative for fever or chills.  Negative for stroke,seizures.  All other systems noncontributory.     Objective: Vital Signs: BP (!) 145/94   Pulse  81   Ht 5\' 6"  (1.676 m)   Wt 184 lb (83.5 kg)   BMI 29.70 kg/m    Physical Exam Constitutional:      Appearance: He is well-developed. HENT:    Head: Normocephalic and atraumatic.    Right Ear: External ear normal.    Left Ear: External ear normal. Eyes:    Pupils: Pupils are equal, round, and reactive to light. Neck:    Thyroid: No thyromegaly.    Trachea: No tracheal deviation. Cardiovascular:    Rate and Rhythm: Normal rate. Pulmonary:    Effort: Pulmonary effort is normal.    Breath sounds: No wheezing. Abdominal:    General: Bowel sounds are normal.    Palpations: Abdomen is soft. Musculoskeletal:    Cervical back: Neck supple. Skin:    General: Skin is warm and dry.    Capillary Refill: Capillary refill takes less than 2 seconds. Neurological:    Mental Status: He is alert and oriented to person, place, and time. Psychiatric:        Behavior: Behavior normal.        Thought Content: Thought content normal.        Judgment: Judgment normal.     Ortho Exam patient has bilateral brachial plexus tenderness.  Cervical flexion limited to 50%.  Positive Lhermitte, positive Spurling right and left.  Slight symmetrical triceps weakness.  He is amatory with a wide spaced gait using his cane.  No lower extremity hyperreflexia no clonus.   Specialty  Comments:  No specialty comments available.   Imaging: CLINICAL DATA:  Spinal stenosis. Chronic neck pain with left arm pain and numbness   EXAM: MRI CERVICAL SPINE WITHOUT CONTRAST   TECHNIQUE: Multiplanar, multisequence MR imaging of the cervical spine was performed. No intravenous contrast was administered.   COMPARISON:  MRI cervical spine 07/04/2019   FINDINGS: Alignment: Mild retrolisthesis C3-4, C4-5, C5-6, C6-7. Straightening of the cervical lordosis.   Vertebrae: Negative for fracture or mass.   Cord: Mild cord hyperintensity at C3-4 appears new. No syrinx or cord mass.   Posterior Fossa, vertebral arteries, paraspinal tissues: Negative   Disc levels:   C2-3: Mild facet degeneration on the left. Negative for spinal or foraminal stenosis   C3-4: Moderate to large central and right-sided disc protrusion with mild progression. There is moderate cord flattening right greater than left and moderate spinal stenosis, with mild progression from the prior study. Slight cord hyperintensity on the right is new. Moderate to severe right foraminal stenosis. Moderate left foraminal stenosis.   C4-5: Disc degeneration and spondylosis. Diffuse uncinate spurring right greater than left. Cord flattening on the right with mild spinal stenosis. Severe right foraminal stenosis and moderate to severe left foraminal stenosis.   C5-6: Disc degeneration with diffuse endplate spurring. Moderate to severe foraminal stenosis bilaterally left greater than right. Mild spinal stenosis.   C6-7: Disc degeneration with diffuse uncinate spurring. Mild spinal stenosis. Moderate left foraminal narrowing and mild right foraminal narrowing   C7-T1: Negative   IMPRESSION: Extensive cervical spondylosis. Multilevel disc degeneration and spurring.   Moderate to large right paracentral disc protrusion at C3-4 with cord flattening and moderate spinal stenosis on the right. Disc protrusion  shows slight progression. Small area of cord hyperintensity on the right at C3-4 is new.   Multilevel spinal and foraminal stenosis throughout the remainder of the cervical spine similar to the prior study.     Electronically Signed   By: 07/06/2019 M.D.  On: 08/20/2020 13:30     PMFS History:     Patient Active Problem List    Diagnosis Date Noted   Spinal stenosis of cervical region 08/30/2020   Chronic radicular cervical pain 05/01/2020   Foraminal stenosis of cervical region (right) 05/01/2020   Chronic pain syndrome 05/01/2020   Cervical myelopathy with cervical radiculopathy (HCC) 05/01/2020   History of lumbar fusion 05/01/2020   HTN (hypertension)     Diabetes mellitus without complication (HCC)     Chronic back pain     Coronary artery disease     CAD (coronary artery disease) 03/22/2019   Hypertension 02/23/2019   Hyperlipidemia 02/23/2019   Chest pain 02/22/2019        Past Medical History:  Diagnosis Date   Chronic back pain     Coronary artery disease     Diabetes mellitus without complication (HCC)     HTN (hypertension)           Family History  Problem Relation Age of Onset   Heart disease Maternal Uncle           Past Surgical History:  Procedure Laterality Date   BACK SURGERY       SHOULDER SURGERY        Social History         Occupational History   Not on file  Tobacco Use   Smoking status: Never   Smokeless tobacco: Never  Substance and Sexual Activity   Alcohol use: Yes      Comment: occasional   Drug use: Yes      Types: Marijuana   Sexual activity: Not on file

## 2020-12-04 NOTE — Interval H&P Note (Signed)
Condition reviewed. Procedure discussed . He understands and agrees to proceed.

## 2020-12-04 NOTE — Anesthesia Postprocedure Evaluation (Signed)
Anesthesia Post Note  Patient: Scott Tucker  Procedure(s) Performed: ANTERIOR CERVICAL DISCECTOMY FUSION C3-4, C4-5, ALLOGRAFT, PLATE (Neck)     Patient location during evaluation: PACU Anesthesia Type: General Level of consciousness: awake Pain management: pain level controlled Respiratory status: spontaneous breathing Cardiovascular status: stable Postop Assessment: no apparent nausea or vomiting Anesthetic complications: no   No notable events documented.  Last Vitals:  Vitals:   12/04/20 0555 12/04/20 1100  BP: 138/71 (!) 147/78  Pulse: 72 (!) 106  Resp: 17 (!) 6  Temp: 36.7 C 36.9 C  SpO2: 97% 94%    Last Pain:  Vitals:   12/04/20 1100  TempSrc:   PainSc: 10-Worst pain ever                 Jayse Hodkinson

## 2020-12-04 NOTE — Transfer of Care (Signed)
Immediate Anesthesia Transfer of Care Note  Patient: COCHISE DINNEEN  Procedure(s) Performed: ANTERIOR CERVICAL DISCECTOMY FUSION C3-4, C4-5, ALLOGRAFT, PLATE (Neck)  Patient Location: PACU  Anesthesia Type:General  Level of Consciousness: awake, alert , oriented and patient cooperative  Airway & Oxygen Therapy: Patient Spontanous Breathing and Patient connected to nasal cannula oxygen  Post-op Assessment: Report given to RN, Post -op Vital signs reviewed and stable and Patient moving all extremities X 4  Post vital signs: Reviewed and stable  Last Vitals:  Vitals Value Taken Time  BP 147/78 12/04/20 1100  Temp 36.9 C 12/04/20 1100  Pulse 97 12/04/20 1108  Resp 18 12/04/20 1108  SpO2 90 % 12/04/20 1108  Vitals shown include unvalidated device data.  Last Pain:  Vitals:   12/04/20 1100  TempSrc:   PainSc: 10-Worst pain ever      Patients Stated Pain Goal: 4 (12/04/20 0617)  Complications: No notable events documented.

## 2020-12-04 NOTE — Telephone Encounter (Signed)
Advised to call surgeon for equipment and home assistance .

## 2020-12-04 NOTE — H&P (Signed)
Scott Tucker is an 55 y.o. male.   Chief Complaint: neck pain and UE radiculopathy HPI: patient with hx of C3-4 and C4-5 HNP/stenosis and above symptoms presents for preop eval.  Progressively worsening symptoms.  Failed conservative treatment.  Waiting for cardiac clearance.  Past Medical History:  Diagnosis Date   Anginal pain (HCC)    Anxiety    Chronic back pain    Coronary artery disease    Diabetes mellitus without complication (HCC)    type 2   Dyspnea    Headache    HTN (hypertension)    Neuromuscular disorder (HCC)     Past Surgical History:  Procedure Laterality Date   BACK SURGERY     1992 and 2003(dr kritzer)   CARDIAC CATHETERIZATION     SHOULDER SURGERY Right    2017   TONSILLECTOMY     removed as a child    Family History  Problem Relation Age of Onset   Heart disease Maternal Uncle    Social History:  reports that he has never smoked. He has never used smokeless tobacco. He reports current alcohol use. He reports current drug use. Drug: Marijuana.  Allergies:  Allergies  Allergen Reactions   Latex Rash    Medications Prior to Admission  Medication Sig Dispense Refill   amLODipine (NORVASC) 10 MG tablet TAKE 1 TABLET (10 MG TOTAL) BY MOUTH DAILY. (Patient taking differently: Take 10 mg by mouth daily.) 90 tablet 1   aspirin 81 MG EC tablet Take 81 mg by mouth daily.     atorvastatin (LIPITOR) 10 MG tablet TAKE 1 TABLET (10 MG TOTAL) BY MOUTH DAILY AT 6 PM. (Patient taking differently: Take 10 mg by mouth daily at 6 PM.) 90 tablet 1   cephALEXin (KEFLEX) 500 MG capsule Take 1 capsule (500 mg total) by mouth 2 (two) times daily. (Patient not taking: No sig reported) 10 capsule 0   FLUoxetine (PROZAC) 20 MG capsule Take 1 capsule (20 mg total) by mouth daily. 90 capsule 1   hydrOXYzine (ATARAX/VISTARIL) 25 MG tablet TAKE 1 TABLET (25 MG TOTAL) BY MOUTH AT BEDTIME AS NEEDED FOR ANXIETY. 90 tablet 0   ibuprofen (ADVIL) 200 MG tablet Take 400 mg by mouth  every 6 (six) hours as needed for headache. (Patient not taking: No sig reported)     losartan (COZAAR) 25 MG tablet Take 1 tablet (25 mg total) by mouth daily. Please make overdue appt with Dr. Eldridge Dace before anymore refills. Thank you 1st attempt 90 tablet 1   metFORMIN (GLUCOPHAGE XR) 500 MG 24 hr tablet Take 1 tablet (500 mg total) by mouth daily with breakfast. 90 tablet 1   nitroGLYCERIN (NITROSTAT) 0.4 MG SL tablet Place 1 tablet (0.4 mg total) under the tongue every 5 (five) minutes as needed for chest pain. 75 tablet 1   terbinafine (LAMISIL) 250 MG tablet Take 1 tablet by mouth daily 30 tablet 1   meclizine (ANTIVERT) 12.5 MG tablet TAKE 1 TABLET (12.5 MG TOTAL) BY MOUTH 3 (THREE) TIMES DAILY AS NEEDED FOR DIZZINESS. 30 tablet 0   methocarbamol (ROBAXIN) 750 MG tablet TAKE 1 TABLET (750 MG TOTAL) BY MOUTH 2 (TWO) TIMES DAILY AS NEEDED FOR MUSCLE SPASMS. 60 tablet 1   pregabalin (LYRICA) 75 MG capsule TAKE 1 CAPSULE (75 MG TOTAL) BY MOUTH 2 (TWO) TIMES DAILY. 60 capsule 1    Results for orders placed or performed during the hospital encounter of 12/04/20 (from the past 48 hour(s))  Glucose,  capillary     Status: Abnormal   Collection Time: 12/04/20  5:57 AM  Result Value Ref Range   Glucose-Capillary 151 (H) 70 - 99 mg/dL    Comment: Glucose reference range applies only to samples taken after fasting for at least 8 hours.   No results found.  Review of Systems  Constitutional:  Positive for activity change.  HENT: Negative.    Respiratory: Negative.    Cardiovascular: Negative.   Gastrointestinal: Negative.   Genitourinary: Negative.   Musculoskeletal:  Positive for gait problem, neck pain and neck stiffness.   Blood pressure 138/71, pulse 72, temperature 98.1 F (36.7 C), temperature source Oral, resp. rate 17, height 5\' 6"  (1.676 m), weight 87.5 kg, SpO2 97 %. Physical Exam HENT:     Head: Normocephalic.     Nose: Nose normal.  Eyes:     Extraocular Movements:  Extraocular movements intact.  Cardiovascular:     Rate and Rhythm: Normal rate and regular rhythm.  Pulmonary:     Effort: Pulmonary effort is normal. No respiratory distress.     Breath sounds: Normal breath sounds.  Abdominal:     General: There is no distension.     Tenderness: There is no abdominal tenderness.  Musculoskeletal:        General: Tenderness present.  Neurological:     Mental Status: He is alert and oriented to person, place, and time.  Psychiatric:        Mood and Affect: Mood normal.     Assessment/Plan large central disc protrusion C3-4 cord flattening myelopathic change in the cord on the right at C3-4 and severe stenosis at C4-5.  Will proceed with C3-4 and C4-5 ACDF as scheduled.  Surgical procedure discussed in detail and all questions answered.    , PA-C 12/04/2020, 7:03 AM

## 2020-12-04 NOTE — Anesthesia Procedure Notes (Signed)
Procedure Name: Intubation Date/Time: 12/04/2020 8:19 AM Performed by: Shanon Payor, CRNA Pre-anesthesia Checklist: Patient identified, Emergency Drugs available, Suction available, Patient being monitored and Timeout performed Patient Re-evaluated:Patient Re-evaluated prior to induction Oxygen Delivery Method: Circle system utilized Preoxygenation: Pre-oxygenation with 100% oxygen Induction Type: IV induction and Rapid sequence Laryngoscope Size: Glidescope and 3 Grade View: Grade I Tube type: Oral Tube size: 7.5 mm Number of attempts: 1 Airway Equipment and Method: Video-laryngoscopy and Stylet Placement Confirmation: ETT inserted through vocal cords under direct vision, positive ETCO2 and breath sounds checked- equal and bilateral Secured at: 22 cm Tube secured with: Tape

## 2020-12-05 ENCOUNTER — Encounter (HOSPITAL_COMMUNITY): Payer: Self-pay | Admitting: Orthopaedic Surgery

## 2020-12-05 DIAGNOSIS — M48061 Spinal stenosis, lumbar region without neurogenic claudication: Secondary | ICD-10-CM | POA: Diagnosis not present

## 2020-12-05 LAB — GLUCOSE, CAPILLARY: Glucose-Capillary: 148 mg/dL — ABNORMAL HIGH (ref 70–99)

## 2020-12-05 LAB — BASIC METABOLIC PANEL
Anion gap: 8 (ref 5–15)
BUN: 8 mg/dL (ref 6–20)
CO2: 24 mmol/L (ref 22–32)
Calcium: 9 mg/dL (ref 8.9–10.3)
Chloride: 103 mmol/L (ref 98–111)
Creatinine, Ser: 0.53 mg/dL — ABNORMAL LOW (ref 0.61–1.24)
GFR, Estimated: >60 mL/min (ref >60)
Glucose, Bld: 139 mg/dL — ABNORMAL HIGH (ref 70–99)
Potassium: 4 mmol/L (ref 3.5–5.1)
Sodium: 135 mmol/L (ref 135–145)

## 2020-12-05 LAB — CBC
HCT: 37 % — ABNORMAL LOW (ref 39.0–52.0)
Hemoglobin: 12.7 g/dL — ABNORMAL LOW (ref 13.0–17.0)
MCH: 31.4 pg (ref 26.0–34.0)
MCHC: 34.3 g/dL (ref 30.0–36.0)
MCV: 91.4 fL (ref 80.0–100.0)
Platelets: 338 10*3/uL (ref 150–400)
RBC: 4.05 MIL/uL — ABNORMAL LOW (ref 4.22–5.81)
RDW: 13.3 % (ref 11.5–15.5)
WBC: 19.9 10*3/uL — ABNORMAL HIGH (ref 4.0–10.5)
nRBC: 0 % (ref 0.0–0.2)

## 2020-12-05 MED ORDER — AMLODIPINE BESYLATE 10 MG PO TABS
10.0000 mg | ORAL_TABLET | Freq: Every day | ORAL | Status: DC
Start: 2020-12-05 — End: 2021-01-25

## 2020-12-05 MED ORDER — ATORVASTATIN CALCIUM 10 MG PO TABS: 10.0000 mg | ORAL_TABLET | Freq: Every day | ORAL | Status: DC

## 2020-12-05 MED ORDER — METHOCARBAMOL 500 MG PO TABS: 500.0000 mg | ORAL_TABLET | Freq: Four times a day (QID) | ORAL | 0 refills | Status: DC | PRN

## 2020-12-05 MED ORDER — OXYCODONE-ACETAMINOPHEN 5-325 MG PO TABS: 1.0000 | ORAL_TABLET | Freq: Four times a day (QID) | ORAL | 0 refills | Status: DC | PRN

## 2020-12-05 NOTE — Progress Notes (Signed)
Orthopedic Tech Progress Note Patient Details:  Scott Tucker [birth year] [id number]  Extra SOFT COLLAR for when showering   Ortho Devices Type of Ortho Device: Soft collar Ortho Device/Splint Location: NECK Ortho Device/Splint Interventions: Ordered   Post Interventions Patient Tolerated: Other (comment) Instructions Provided: Other (comment)  Donald Pore 12/05/2020, 8:18 AM

## 2020-12-05 NOTE — Progress Notes (Signed)
Patient alert and oriented, voiding adequately, skin clean, dry and intact without evidence of skin break down, or symptoms of complications - no redness or edema noted, only slight tenderness at site.  Patient states pain is manageable at time of discharge. Patient has an appointment with MD in 1 week 

## 2020-12-05 NOTE — Evaluation (Signed)
Occupational Therapy Evaluation Patient Details Name: Scott Tucker MRN: [id number] DOB: Jun 11, 1965 Today's Date: 12/05/2020    History of Present Illness Pt is a 55 yr old male who s/p C3-4, C4-5 anterior cervical discectomy and fusion, allograft and plate due to reports of severe cervical stenosis with myelopathy. PMH but not limited to: CAD, HTN, lumbar fusion, DM, chronic back pain   Clinical Impression   Patient is s/p  C3-4, C4-5 anterior cervical discectomy and fusion, allograft and plate surgery resulting in the deficits listed below (see OT Problem List).  At [facility] they report they were having several falls and using a walking stick. Pt reports they have their girlfriend to assist with some IADLS when returning home. Pt when completed sit to stand takes increase time with supervision due to reports of slight dizziness. Pt then competed LE dressing with min guard to min assist depending on pain levels, standing toilet tasks with min guard as pt noted to have postural swaying. Pt was educated about compensations and DME to use to be able to decrease fall risk and follow precautions. Patient will benefit from skilled OT to increase their safety and independence with ADL and functional mobility for ADL (while adhering to their precautions) to facilitate discharge to venue listed below.      Follow Up Recommendations  Home health OT;Supervision - Intermittent    Equipment Recommendations  Tub/shower bench (RW, long handle reacher and sponge)    Recommendations for Other Services       Precautions / Restrictions Precautions Precautions: Cervical Precaution Comments: Reviewed with pt on precuations Required Braces or Orthoses: Cervical Brace Cervical Brace: Soft collar      Mobility Bed Mobility Overal bed mobility: Needs Assistance Bed Mobility: Supine to Sit     Supine to sit: HOB elevated;Min guard     General bed mobility comments: Per pt reproted the Md advising they  sit in chair/upright    Transfers Overall transfer level: Needs assistance Equipment used: Rolling walker (2 wheeled) Transfers: Sit to/from Stand Sit to Stand: Supervision              Balance Overall balance assessment: Mild deficits observed, not formally tested                                         ADL either performed or assessed with clinical judgement   ADL Overall ADL's : Needs assistance/impaired Eating/Feeding: Modified independent;Sitting   Grooming: Wash/dry hands;Wash/dry face;Min guard;Standing;Cueing for safety;Cueing for sequencing   Upper Body Bathing: Min guard;Cueing for safety;Cueing for sequencing;Sitting   Lower Body Bathing: Minimal assistance;Cueing for safety;Cueing for sequencing;Sit to/from stand   Upper Body Dressing : Min guard;Cueing for safety;Cueing for sequencing;Sitting   Lower Body Dressing: Minimal assistance;Cueing for safety;Cueing for sequencing;Sit to/from stand   Toilet Transfer: Min guard;Ambulation;RW   Toileting- Clothing Manipulation and Hygiene: Min guard;Cueing for safety;Cueing for sequencing;Sit to/from stand   Tub/ Shower Transfer: Min guard;Cueing for safety;Cueing for sequencing;Tub bench   Functional mobility during ADLs: Min guard;Cueing for safety;Cueing for sequencing;Rolling walker General ADL Comments: Pt was recomended to use RW and transfer bench     Vision         Perception     Praxis      Pertinent Vitals/Pain Pain Assessment: 0-10 Pain Score: 10-Worst pain ever Pain Descriptors / Indicators: Aching Pain Intervention(s): Limited activity within patient's tolerance;Premedicated before  session     Hand Dominance Right   Extremity/Trunk Assessment Upper Extremity Assessment Upper Extremity Assessment: RUE deficits/detail;LUE deficits/detail RUE Deficits / Details: Pt reports about 90 degrees in RUE from PLOF RUE Sensation: decreased light touch RUE Coordination: decreased  fine motor LUE Sensation: decreased light touch LUE Coordination: decreased fine motor   Lower Extremity Assessment Lower Extremity Assessment: Defer to PT evaluation   Cervical / Trunk Assessment Cervical / Trunk Assessment: Kyphotic;Other exceptions (hx of sx)   Communication Communication Communication: No difficulties   Cognition Arousal/Alertness: Awake/alert Behavior During Therapy: WFL for tasks assessed/performed Overall Cognitive Status: Within Functional Limits for tasks assessed                                     General Comments       Exercises     Shoulder Instructions      Home Living Family/patient expects to be discharged to:: Private residence Living Arrangements: Spouse/significant other Available Help at Discharge: Available PRN/intermittently (girlfriend) Type of Home: House Home Access: Stairs to enter [employer] of Steps: 3 then 2 Entrance Stairs-Rails: None Home Layout: Two level;1/2 bath on main level;Bed/bath upstairs Alternate Level Stairs-Number of Steps: reports flight   Bathroom Shower/Tub: Tub only;Curtain   Bathroom Toilet: Standard Bathroom Accessibility: Yes   Home Equipment: Grab bars - tub/shower (walking stick)          Prior Functioning/Environment Level of Independence: Independent with assistive device(s)        Comments: Pt reports that they were using walking stick at prior level but having falls, decrease balance and pain        OT Problem List: Decreased strength;Decreased range of motion;Decreased activity tolerance;Impaired balance (sitting and/or standing);Decreased safety awareness;Pain      OT Treatment/Interventions: Self-care/ADL training;Therapeutic exercise;DME and/or AE instruction;Therapeutic activities;Patient/family education;Balance training    OT Goals(Current goals can be found in the care plan section) Acute Rehab OT Goals Patient Stated Goal: to sleep more then two  hours OT Goal Formulation: With patient Time For Goal Achievement: 12/23/20 Potential to Achieve Goals: Good ADL Goals Pt Will Perform Upper Body Bathing: with modified independence;sitting Pt Will Perform Lower Body Bathing: with modified independence;sit to/from stand Pt Will Transfer to Toilet: with modified independence;ambulating;regular height toilet;grab bars Pt Will Perform Tub/Shower Transfer: Tub transfer;with modified independence;ambulating;tub bench  OT Frequency: Min 2X/week   Barriers to D/C:            Co-evaluation              AM-PAC OT "6 Clicks" Daily Activity     Outcome Measure Help from another person eating meals?: None Help from another person taking care of personal grooming?: None Help from another person toileting, which includes using toliet, bedpan, or urinal?: A Little Help from another person bathing (including washing, rinsing, drying)?: A Little Help from another person to put on and taking off regular upper body clothing?: None Help from another person to put on and taking off regular lower body clothing?: A Little 6 Click Score: 21   End of Session Equipment Utilized During Treatment: Gait belt;Rolling walker;Cervical collar  Activity Tolerance: Patient tolerated treatment well Patient left: in bed;with call bell/phone within reach;with family/visitor present (girlfriend)  OT Visit Diagnosis: Unsteadiness on feet (R26.81);Other abnormalities of gait and mobility (R26.89);Repeated falls (R29.6);Muscle weakness (generalized) (M62.81);Pain Pain - part of body:  (neck down B legs)  Time: 0720-0756 OT Time Calculation (min): 36 min Charges:  OT General Charges $OT Visit: 1 Visit OT Evaluation $OT Eval Low Complexity: 1 Low OT Treatments $Self Care/Home Management : 8-22 mins  Alphia Moh OTR/L  Acute Rehab Services  [phone] office number [phone] pager number   Alphia Moh 12/05/2020, 8:13 AM

## 2020-12-05 NOTE — Plan of Care (Signed)
Adequately ready for discharge 

## 2020-12-05 NOTE — Progress Notes (Addendum)
   Subjective: 1 Day Post-Op Procedure(s) (LRB): ANTERIOR CERVICAL DISCECTOMY FUSION C3-4, C4-5, ALLOGRAFT, PLATE (N/A) Patient reports pain as moderate.  " My balance and walking is better"   we discussed past Pain clinic medications when in [place] and spine clinic in [place]     Morphine and Oxy.   Objective: Vital signs in last 24 hours: Temp:  [97.8 F (36.6 C)-98.5 F (36.9 C)] 97.9 F (36.6 C) (07/26 0340) Pulse Rate:  [63-106] 63 (07/26 0340) Resp:  [6-20] 20 (07/26 0340) BP: (120-159)/(71-86) 141/81 (07/26 0340) SpO2:  [93 %-99 %] 97 % (07/26 0340)  Intake/Output from previous day: 07/25 0701 - 07/26 0700 In: 2030 [P.O.:480; I.V.:1200; IV Piggyback:350] Out: 290 [Drains:40; Blood:250] Intake/Output this shift: No intake/output data recorded.  Recent Labs    12/05/20 0432  HGB 12.7*   Recent Labs    12/05/20 0432  WBC 19.9*  RBC 4.05*  HCT 37.0*  PLT 338   Recent Labs    12/05/20 0432  NA 135  K 4.0  CL 103  CO2 24  BUN 8  CREATININE 0.53*  GLUCOSE 139*  CALCIUM 9.0   No results for input(s): LABPT, INR in the last 72 hours.  MAE, walking with walker .  DG Cervical Spine 2 or 3 views  Result Date: 12/04/2020 CLINICAL DATA:  Surgery, elective Z41.9 (ICD-10-CM). Additional history provided by technologist: ACDF. Provided fluoroscopy time 13 seconds (2.8843 mGy). EXAM: CERVICAL SPINE - 2-3 VIEW; DG C-ARM 1-60 MIN COMPARISON:  Cervical spine MRI 08/19/2020. FINDINGS: PA and lateral view intraoperative fluoroscopic images of the cervical spine are submitted, 2 images total. On the provided images, ACDF hardware is present at the C3-C5 levels (ventral plate and screws, as well as interbody devices). Partially visualized ET tube. IMPRESSION: Two intraoperative fluoroscopic images of the cervical spine from C3-C5 ACDF, as described. Electronically Signed   By: Jackey Loge DO   On: 12/04/2020 10:54   DG C-Arm 1-60 Min  Result Date:  12/04/2020 CLINICAL DATA:  Surgery, elective Z41.9 (ICD-10-CM). Additional history provided by technologist: ACDF. Provided fluoroscopy time 13 seconds (2.8843 mGy). EXAM: CERVICAL SPINE - 2-3 VIEW; DG C-ARM 1-60 MIN COMPARISON:  Cervical spine MRI 08/19/2020. FINDINGS: PA and lateral view intraoperative fluoroscopic images of the cervical spine are submitted, 2 images total. On the provided images, ACDF hardware is present at the C3-C5 levels (ventral plate and screws, as well as interbody devices). Partially visualized ET tube. IMPRESSION: Two intraoperative fluoroscopic images of the cervical spine from C3-C5 ACDF, as described. Electronically Signed   By: Jackey Loge DO   On: 12/04/2020 10:54    Assessment/Plan: 1 Day Post-Op Procedure(s) (LRB): ANTERIOR CERVICAL DISCECTOMY FUSION C3-4, C4-5, ALLOGRAFT, PLATE (N/A) Plan :discharge home.   Scott Tucker 12/05/2020, 7:50 AM

## 2020-12-05 NOTE — Evaluation (Signed)
Physical Therapy Evaluation Patient Details Name: Scott Tucker MRN: [id number] DOB: [birth year] Today's Date: 12/05/2020   History of Present Illness  Pt is a 55 yr old male who s/p C3-4, C4-5 anterior cervical discectomy and fusion, allograft and plate due to reports of severe cervical stenosis with myelopathy. PMH but not limited to: CAD, HTN, lumbar fusion, DM, chronic back pain   Clinical Impression  Pt admitted with above diagnosis. At the time of PT eval, pt was able to demonstrate transfers and ambulation with gross supervision for safety and RW for support. Pt was educated on precautions, brace application/wearing schedule, appropriate activity progression, and car transfer. Pt currently with functional limitations due to the deficits listed below (see PT Problem List). Pt will benefit from skilled PT to increase their independence and safety with mobility to allow discharge to the venue listed below.      Follow Up Recommendations No PT follow up;Supervision for mobility/OOB    Equipment Recommendations  Rolling walker with 5" wheels    Recommendations for Other Services       Precautions / Restrictions Precautions Precautions: Cervical Precaution Booklet Issued: Yes (comment) Precaution Comments: Reviewed handout and pt was cued for precautions during functional mobility. Required Braces or Orthoses: Cervical Brace Cervical Brace: Soft collar Restrictions Weight Bearing Restrictions: No      Mobility  Bed Mobility Overal bed mobility: Modified Independent Bed Mobility: Rolling;Sidelying to Sit;Sit to Sidelying Rolling: Modified independent (Device/Increase time) Sidelying to sit: Modified independent (Device/Increase time)     Sit to sidelying: Modified independent (Device/Increase time) General bed mobility comments: VC's for optimal log roll. HOB slightly elevated and rails lowered to simulate home environment if pt sleeping in bed at home. Pt reports he does not  have a recliner and may not be able to sit up in a straight backed chair to sleep due to low back pain.    Transfers Overall transfer level: Needs assistance Equipment used: Rolling walker (2 wheeled) Transfers: Sit to/from Stand Sit to Stand: Supervision         General transfer comment: VC's for hand placement on seated surface for safety. No assist required to power-up to full stand however increased time required due to pain.  Ambulation/Gait Ambulation/Gait assistance: Supervision Gait Distance (Feet): 200 Feet Assistive device: Rolling walker (2 wheeled) Gait Pattern/deviations: Step-through pattern;Decreased stride length;Trunk flexed;Narrow base of support Gait velocity: Decreased Gait velocity interpretation: <1.8 ft/sec, indicate of risk for recurrent falls General Gait Details: Slow and guarded due to pain. No assist required.  Stairs            Wheelchair Mobility    Modified Rankin (Stroke Patients Only)       Balance Overall balance assessment: Mild deficits observed, not formally tested                                           Pertinent Vitals/Pain Pain Assessment: 0-10 Pain Score: 10-Worst pain ever Pain Location: Neck and low back Pain Descriptors / Indicators: Aching;Operative site guarding Pain Intervention(s): Limited activity within patient's tolerance;Monitored during session;Repositioned    Home Living Family/patient expects to be discharged to:: Private residence Living Arrangements: Spouse/significant other Available Help at Discharge: Available PRN/intermittently (girlfriend) Type of Home: House Home Access: Stairs to enter Entrance Stairs-Rails: None Entrance Stairs-Number of Steps: 3 then 2 Home Layout: Two level;1/2 bath on main level;Bed/bath upstairs Home  Equipment: Grab bars - tub/shower (walking stick)      Prior Function Level of Independence: Independent with assistive device(s)         Comments:  Pt reports that they were using walking stick at prior level but having falls, decrease balance and pain     Hand Dominance   Dominant Hand: Right    Extremity/Trunk Assessment   Upper Extremity Assessment Upper Extremity Assessment: Defer to OT evaluation    Lower Extremity Assessment Lower Extremity Assessment: Generalized weakness    Cervical / Trunk Assessment Cervical / Trunk Assessment: Other exceptions Cervical / Trunk Exceptions: s/p surgery; having significant low back pain (baseline)  Communication   Communication: No difficulties  Cognition Arousal/Alertness: Awake/alert Behavior During Therapy: WFL for tasks assessed/performed Overall Cognitive Status: Within Functional Limits for tasks assessed                                        General Comments      Exercises     Assessment/Plan    PT Assessment Patient needs continued PT services  PT Problem List Decreased safety awareness;Decreased strength;Decreased activity tolerance;Decreased balance;Decreased mobility;Decreased knowledge of use of DME;Decreased knowledge of precautions;Pain       PT Treatment Interventions DME instruction;Gait training;Functional mobility training;Stair training;Therapeutic activities;Therapeutic exercise;Neuromuscular re-education;Patient/family education    PT Goals (Current goals can be found in the Care Plan section)  Acute Rehab PT Goals Patient Stated Goal: to sleep more then two hours PT Goal Formulation: With patient/family Time For Goal Achievement: 12/12/20 Potential to Achieve Goals: Good    Frequency Min 5X/week   Barriers to discharge        Co-evaluation               AM-PAC PT "6 Clicks" Mobility  Outcome Measure Help needed turning from your back to your side while in a flat bed without using bedrails?: None Help needed moving from lying on your back to sitting on the side of a flat bed without using bedrails?: A Little Help  needed moving to and from a bed to a chair (including a wheelchair)?: A Little Help needed standing up from a chair using your arms (e.g., wheelchair or bedside chair)?: A Little Help needed to walk in hospital room?: A Little Help needed climbing 3-5 steps with a railing? : A Little 6 Click Score: 19    End of Session Equipment Utilized During Treatment: Cervical collar Activity Tolerance: Patient limited by fatigue;Patient limited by pain Patient left: in bed;with call bell/phone within reach;with family/visitor present;with nursing/sitter in room Nurse Communication: Mobility status PT Visit Diagnosis: Unsteadiness on feet (R26.81);Pain Pain - part of body:  (back)    Time: 3818-2993 PT Time Calculation (min) (ACUTE ONLY): 30 min   Charges:   PT Evaluation $PT Eval Low Complexity: 1 Low PT Treatments $Gait Training: 8-22 mins        Conni Slipper, PT, DPT Acute Rehabilitation Services Pager: [phone] Office: [phone]   Marylynn Pearson 12/05/2020, 1:12 PM

## 2020-12-05 NOTE — Discharge Instructions (Signed)
Walk daily.  He will have an extra collar that she can wrap with Saran wrap when he takes shower.  Follow-up with Dr. Ophelia Charter in 1 week.  We will have less pain in left difficulty swallowing sitting in a chair or recliner position.  Pain medication and muscle relaxant has been sent to pharmacy.  He did not need to change the dressing just leave it on to return to the office in 1 week.

## 2020-12-06 ENCOUNTER — Telehealth: Payer: Self-pay

## 2020-12-06 NOTE — Telephone Encounter (Signed)
Transition Care Management Follow-up Telephone Call   Date of discharge and from where:[facility] 12/05/2020 How have you been since you were released from the hospital? Same still feeling pain Any questions or concerns? No questions/concerns reported.  Items Reviewed: Did the pt receive and understand the discharge instructions provided? have the instructions and have no questions.  Medications obtained and verified? He said that he have the medication list  and the hospital staff reviewed them in detail prior to discharge. He said that he has all of the medications he asked the surgeon for a stronger pain reliever and was not given, that he used to take something stronger than Percocet.  Has have no questions.  Any new allergies since your discharge? None reported  Do you have support at home? Yes, girlfriend and family Other (ie: DME, Home Health, etc)     shower chair and Walker    Functional Questionnaire: (I = Independent and D = Dependent) ADL's:  Independent.        Follow up appointments reviewed:   PCP Hospital f/u appt confirmed? NP Gwinda Passe on 12/20/2020@ 1050.  Specialist Hospital f/u appt confirmed? None scheduled at this time . Stressed the importance to f/u care with Dr Ophelia Charter and schedule appt ASAP. Stated will call today and do that Are transportation arrangements needed? have transportation   If their condition worsens, is the pt aware to call  their PCP or go to the ED? Yes.Made pt aware if condition worsen or start experiencing rapid weight gain, chest pain, diff breathing, SOB, high fevers, or bleading to refer imediately to ED for further evaluation.  Was the patient provided with contact information for the PCP's office or ED? He has the phone number  Was the pt encouraged to call back with questions or concerns?yes

## 2020-12-07 ENCOUNTER — Telehealth: Payer: Self-pay | Admitting: Orthopaedic Surgery

## 2020-12-07 NOTE — Telephone Encounter (Signed)
Pt's girlfriend Rodell Perna called requesting a call back from [place]. She states she has questions about his surgery that passed on this past 2022-09-09. Pease call her at [phone].

## 2020-12-07 NOTE — Telephone Encounter (Signed)
Patient is post cervical fusion. Scott Tucker called and states that his breath smells of dried blood and he is having to spit and has a lot of drainage. He is also having a great deal of pain. She denies fever, however, is concerned about infection.  I advised his throat may have been scratched with tube used for anesthesia and explained that things are moved around in order to get to the spine for anterior fusion. She would like a call back after Dr. Ophelia Charter reviews to be sure that everything is ok.

## 2020-12-12 ENCOUNTER — Ambulatory Visit (INDEPENDENT_AMBULATORY_CARE_PROVIDER_SITE_OTHER): Payer: Medicaid Other | Admitting: Orthopaedic Surgery

## 2020-12-12 ENCOUNTER — Ambulatory Visit (INDEPENDENT_AMBULATORY_CARE_PROVIDER_SITE_OTHER): Payer: Medicaid Other

## 2020-12-12 ENCOUNTER — Other Ambulatory Visit: Payer: Self-pay

## 2020-12-12 ENCOUNTER — Encounter: Payer: Self-pay | Admitting: Orthopaedic Surgery

## 2020-12-12 VITALS — BP 134/84 | HR 76 | Ht 66.0 in | Wt 192.0 lb

## 2020-12-12 DIAGNOSIS — Z981 Arthrodesis status: Secondary | ICD-10-CM

## 2020-12-12 NOTE — Progress Notes (Signed)
Post-Op Visit Note   Patient: Scott Tucker           Date of Birth: 11-02-1965           MRN: [id number] Visit Date: 12/12/2020 PCP: Grayce Sessions, NP   Assessment & Plan: Post two-level cervical fusion he had myelopathic changes in his cord he can walk a little bit in the office today without the cane previously he was staggering and going to one side of the other.  He has noticed improvement in some of the numbness in his fingers.  He still has weakness and had some cord damage but has gotten some improvement since the surgery and hopefully will continue to gradually improve with time.  He understands that he may not get total return due to the cord damage that is present.  Steri-Strips changed x-rays look good return 5 weeks.  Chief Complaint:  Chief Complaint  Patient presents with   Neck - Routine Post Op    12/04/2020 C3-4, C4-5 ACDF   Visit Diagnoses:  1. Status post cervical spinal fusion     Plan: Continue walking using his cane to help prevent fall prevention.  Continue collar.  He knows to not do any lifting or bending and just do some walking each day.  I will check him in 5 to 6 weeks with lateral flexion-extension C-spine x-rays on return.  Follow-Up Instructions: No follow-ups on file.   Orders:  Orders Placed This Encounter  Procedures   XR Cervical Spine 2 or 3 views   No orders of the defined types were placed in this encounter.   Imaging: XR Cervical Spine 2 or 3 views  Result Date: 12/12/2020 AP lateral cervical spine images are obtained and reviewed.  This shows anterior cervical discectomy and fusion C3-4 C4-5 two-level cervical fusion with allograft and plate.  Good position and alignment on both radiographs. Impression: Satisfactory C3-C5 two-level cervical fusion with allograft and plate.   PMFS History: Patient Active Problem List   Diagnosis Date Noted   Cervical stenosis of spine 12/04/2020   Spinal stenosis of cervical region 08/30/2020    Chronic radicular cervical pain 05/01/2020   Foraminal stenosis of cervical region (right) 05/01/2020   Chronic pain syndrome 05/01/2020   Cervical myelopathy with cervical radiculopathy (HCC) 05/01/2020   History of lumbar fusion 05/01/2020   HTN (hypertension)    Diabetes mellitus without complication (HCC)    Chronic back pain    Coronary artery disease    CAD (coronary artery disease) 03/22/2019   Hypertension 02/23/2019   Hyperlipidemia 02/23/2019   Chest pain 02/22/2019   Past Medical History:  Diagnosis Date   Anginal pain (HCC)    Anxiety    Chronic back pain    Coronary artery disease    Diabetes mellitus without complication (HCC)    type 2   Dyspnea    Headache    HTN (hypertension)    Neuromuscular disorder (HCC)     Family History  Problem Relation Age of Onset   Heart disease Maternal Uncle     Past Surgical History:  Procedure Laterality Date   ANTERIOR CERVICAL DECOMP/DISCECTOMY FUSION N/A 12/04/2020   Procedure: ANTERIOR CERVICAL DISCECTOMY FUSION C3-4, C4-5, ALLOGRAFT, PLATE;  Surgeon: Eldred Manges, MD;  Location: [facility] OR;  Service: Orthopedics;  Laterality: N/A;  needs RNFA   BACK SURGERY     1992 and 2003(dr kritzer)   CARDIAC CATHETERIZATION     SHOULDER SURGERY Right  2017   TONSILLECTOMY     removed as a child   Social History   Occupational History   Not on file  Tobacco Use   Smoking status: Never   Smokeless tobacco: Never  Vaping Use   Vaping Use: Never used  Substance and Sexual Activity   Alcohol use: Yes    Comment: 1-2 glasses a day   Drug use: Yes    Types: Marijuana    Comment: 3 times a day   Sexual activity: Not on file

## 2020-12-12 NOTE — Discharge Summary (Signed)
Patient ID: Scott Tucker MRN: [id number] DOB/AGE: 1965/09/10 55 y.o.  Admit date: 12/04/2020 Discharge date: 12/05/2020  Admission Diagnoses:  Active Problems:   Cervical myelopathy with cervical radiculopathy (HCC)   Cervical stenosis of spine   Discharge Diagnoses:  Active Problems:   Cervical myelopathy with cervical radiculopathy (HCC)   Cervical stenosis of spine  status post Procedure(s): ANTERIOR CERVICAL DISCECTOMY FUSION C3-4, C4-5, ALLOGRAFT, PLATE  Past Medical History:  Diagnosis Date   Anginal pain (HCC)    Anxiety    Chronic back pain    Coronary artery disease    Diabetes mellitus without complication (HCC)    type 2   Dyspnea    Headache    HTN (hypertension)    Neuromuscular disorder (HCC)     Surgeries: Procedure(s): ANTERIOR CERVICAL DISCECTOMY FUSION C3-4, C4-5, ALLOGRAFT, PLATE on 9/67/8938   Consultants:   Discharged Condition: Improved  Hospital Course: Scott Tucker is an 55 y.o. male who was admitted 12/04/2020 for operative treatment of C3-4,C4-5 HNP/stenosis, myelopathy. Patient failed conservative treatments (please see the history and physical for the specifics) and had severe unremitting pain that affects sleep, daily activities and work/hobbies. After pre-op clearance, the patient was taken to the operating room on 12/04/2020 and underwent  Procedure(s): ANTERIOR CERVICAL DISCECTOMY FUSION C3-4, C4-5, ALLOGRAFT, PLATE.    Patient was given perioperative antibiotics:  Anti-infectives (From admission, onward)    Start     Dose/Rate Route Frequency Ordered Stop   12/05/20 1000  terbinafine (LAMISIL) tablet 250 mg  Status:  Discontinued        250 mg Oral Daily 12/04/20 1247 12/05/20 1900   12/04/20 0600  ceFAZolin (ANCEF) IVPB 2g/100 mL premix        2 g 200 mL/hr over 30 Minutes Intravenous On call to O.R. 12/04/20 0549 12/04/20 0730        Patient was given sequential compression devices and early ambulation to prevent DVT.    Patient benefited maximally from hospital stay and there were no complications. At the time of discharge, the patient was urinating/moving their bowels without difficulty, tolerating a regular diet, pain is controlled with oral pain medications and they have been cleared by PT/OT.   Recent vital signs: No data found.   Recent laboratory studies: No results for input(s): WBC, HGB, HCT, PLT, NA, K, CL, CO2, BUN, CREATININE, GLUCOSE, INR, CALCIUM in the last 72 hours.  Invalid input(s): PT, 2   Discharge Medications:   Allergies as of 12/05/2020       Reactions   Latex Rash        Medication List     TAKE these medications    amLODipine 10 MG tablet Commonly known as: NORVASC Take 1 tablet (10 mg total) by mouth daily.   aspirin 81 MG EC tablet Take 81 mg by mouth daily.   atorvastatin 10 MG tablet Commonly known as: LIPITOR Take 1 tablet (10 mg total) by mouth daily at 6 PM.   FLUoxetine 20 MG capsule Commonly known as: PROZAC Take 1 capsule (20 mg total) by mouth daily.   hydrOXYzine 25 MG tablet Commonly known as: ATARAX/VISTARIL TAKE 1 TABLET (25 MG TOTAL) BY MOUTH AT BEDTIME AS NEEDED FOR ANXIETY.   losartan 25 MG tablet Commonly known as: COZAAR Take 1 tablet (25 mg total) by mouth daily. Please make overdue appt with Dr. Eldridge Dace before anymore refills. Thank you 1st attempt   meclizine 12.5 MG tablet Commonly known as: ANTIVERT TAKE 1  TABLET (12.5 MG TOTAL) BY MOUTH 3 (THREE) TIMES DAILY AS NEEDED FOR DIZZINESS.   metFORMIN 500 MG 24 hr tablet Commonly known as: Glucophage XR Take 1 tablet (500 mg total) by mouth daily with breakfast.   methocarbamol 500 MG tablet Commonly known as: ROBAXIN Take 1 tablet (500 mg total) by mouth every 6 (six) hours as needed for muscle spasms. What changed:  medication strength how much to take how to take this when to take this reasons to take this   nitroGLYCERIN 0.4 MG SL tablet Commonly known as:  NITROSTAT Place 1 tablet (0.4 mg total) under the tongue every 5 (five) minutes as needed for chest pain.   oxyCODONE-acetaminophen 5-325 MG tablet Commonly known as: Percocet Take 1-2 tablets by mouth every 6 (six) hours as needed for severe pain.   pregabalin 75 MG capsule Commonly known as: LYRICA TAKE 1 CAPSULE (75 MG TOTAL) BY MOUTH 2 (TWO) TIMES DAILY.   terbinafine 250 MG tablet Commonly known as: LAMISIL Take 1 tablet by mouth daily        Diagnostic Studies: DG Cervical Spine 2 or 3 views  Result Date: 12/04/2020 CLINICAL DATA:  Surgery, elective Z41.9 (ICD-10-CM). Additional history provided by technologist: ACDF. Provided fluoroscopy time 13 seconds (2.8843 mGy). EXAM: CERVICAL SPINE - 2-3 VIEW; DG C-ARM 1-60 MIN COMPARISON:  Cervical spine MRI 08/19/2020. FINDINGS: PA and lateral view intraoperative fluoroscopic images of the cervical spine are submitted, 2 images total. On the provided images, ACDF hardware is present at the C3-C5 levels (ventral plate and screws, as well as interbody devices). Partially visualized ET tube. IMPRESSION: Two intraoperative fluoroscopic images of the cervical spine from C3-C5 ACDF, as described. Electronically Signed   By: Jackey Loge DO   On: 12/04/2020 10:54   DG C-Arm 1-60 Min  Result Date: 12/04/2020 CLINICAL DATA:  Surgery, elective Z41.9 (ICD-10-CM). Additional history provided by technologist: ACDF. Provided fluoroscopy time 13 seconds (2.8843 mGy). EXAM: CERVICAL SPINE - 2-3 VIEW; DG C-ARM 1-60 MIN COMPARISON:  Cervical spine MRI 08/19/2020. FINDINGS: PA and lateral view intraoperative fluoroscopic images of the cervical spine are submitted, 2 images total. On the provided images, ACDF hardware is present at the C3-C5 levels (ventral plate and screws, as well as interbody devices). Partially visualized ET tube. IMPRESSION: Two intraoperative fluoroscopic images of the cervical spine from C3-C5 ACDF, as described. Electronically Signed    By: Jackey Loge DO   On: 12/04/2020 10:54   XR Cervical Spine 2 or 3 views  Result Date: 12/12/2020 AP lateral cervical spine images are obtained and reviewed.  This shows anterior cervical discectomy and fusion C3-4 C4-5 two-level cervical fusion with allograft and plate.  Good position and alignment on both radiographs. Impression: Satisfactory C3-C5 two-level cervical fusion with allograft and plate.   Discharge Instructions     Incentive spirometry RT   Complete by: As directed         Follow-up Information     Eldred Manges, MD Follow up.   Specialty: Orthopedic Surgery Contact information: [address] [place] [address] [phone]                 Discharge Plan:  discharge to home  Disposition:     Signed: [name]  12/12/2020, 2:21 PM

## 2020-12-16 ENCOUNTER — Encounter: Payer: Self-pay | Admitting: Orthopaedic Surgery

## 2020-12-18 ENCOUNTER — Encounter: Payer: Self-pay | Admitting: Orthopaedic Surgery

## 2020-12-18 ENCOUNTER — Ambulatory Visit (INDEPENDENT_AMBULATORY_CARE_PROVIDER_SITE_OTHER): Payer: Medicaid Other

## 2020-12-18 ENCOUNTER — Ambulatory Visit (INDEPENDENT_AMBULATORY_CARE_PROVIDER_SITE_OTHER): Payer: Medicaid Other | Admitting: Orthopaedic Surgery

## 2020-12-18 ENCOUNTER — Other Ambulatory Visit: Payer: Self-pay

## 2020-12-18 VITALS — BP 138/84 | HR 93 | Ht 66.0 in | Wt 192.0 lb

## 2020-12-18 DIAGNOSIS — Z981 Arthrodesis status: Secondary | ICD-10-CM

## 2020-12-18 DIAGNOSIS — G959 Disease of spinal cord, unspecified: Secondary | ICD-10-CM

## 2020-12-18 DIAGNOSIS — M542 Cervicalgia: Secondary | ICD-10-CM

## 2020-12-18 DIAGNOSIS — M5412 Radiculopathy, cervical region: Secondary | ICD-10-CM

## 2020-12-18 NOTE — Progress Notes (Signed)
Post-Op Visit Note   Patient: Scott Tucker           Date of Birth: Jan 28, 1966           MRN: [id number] Visit Date: 12/18/2020 PCP: Grayce Sessions, NP   Assessment & Plan: Patient had two-level cervical fusion C3-4 C4-5 with myelopathic changes abnormal gait with improvement in his hand tingling surgery date was 12/04/2020.  Patient fell 2 days ago he is got up with taking few steps turned back around and fell over the ottoman hitting the back of his head.  Did not have any bleeding he did not have a particular bump on his head he states that was sore just above the soft collar.  He denies any changes in his numbness or tingling that was in his hand which is improved from preop.  He does have a walker at home but has been using his straight custom hand carved cane.  2 view x-ray showed good position plate and screws unchanged from Intra-Op and 1 week postop images.  Chief Complaint:  Chief Complaint  Patient presents with   Neck - Pain    Fall 12/16/2020   Visit Diagnoses:  1. Neck pain   2. Cervical myelopathy with cervical radiculopathy (HCC)   3. S/P cervical spinal fusion     Plan: X-rays are unchanged he needs to use his walker to decrease falling risk.  Recheck as scheduled.  Follow-Up Instructions: No follow-ups on file.   Orders:  Orders Placed This Encounter  Procedures   XR Cervical Spine 2 or 3 views   No orders of the defined types were placed in this encounter.   Imaging: No results found.  PMFS History: Patient Active Problem List   Diagnosis Date Noted   S/P cervical spinal fusion 12/18/2020   Cervical stenosis of spine 12/04/2020   Chronic radicular cervical pain 05/01/2020   Foraminal stenosis of cervical region (right) 05/01/2020   Chronic pain syndrome 05/01/2020   Cervical myelopathy with cervical radiculopathy (HCC) 05/01/2020   History of lumbar fusion 05/01/2020   HTN (hypertension)    Diabetes mellitus without complication (HCC)     Chronic back pain    Coronary artery disease    CAD (coronary artery disease) 03/22/2019   Hypertension 02/23/2019   Hyperlipidemia 02/23/2019   Chest pain 02/22/2019   Past Medical History:  Diagnosis Date   Anginal pain (HCC)    Anxiety    Chronic back pain    Coronary artery disease    Diabetes mellitus without complication (HCC)    type 2   Dyspnea    Headache    HTN (hypertension)    Neuromuscular disorder (HCC)     Family History  Problem Relation Age of Onset   Heart disease Maternal Uncle     Past Surgical History:  Procedure Laterality Date   ANTERIOR CERVICAL DECOMP/DISCECTOMY FUSION N/A 12/04/2020   Procedure: ANTERIOR CERVICAL DISCECTOMY FUSION C3-4, C4-5, ALLOGRAFT, PLATE;  Surgeon: Eldred Manges, MD;  Location: [facility] OR;  Service: Orthopedics;  Laterality: N/A;  needs RNFA   BACK SURGERY     1992 and 2003(dr kritzer)   CARDIAC CATHETERIZATION     SHOULDER SURGERY Right    2017   TONSILLECTOMY     removed as a child   Social History   Occupational History   Not on file  Tobacco Use   Smoking status: Never   Smokeless tobacco: Never  Vaping Use   Vaping Use:  Never used  Substance and Sexual Activity   Alcohol use: Yes    Comment: 1-2 glasses a day   Drug use: Yes    Types: Marijuana    Comment: 3 times a day   Sexual activity: Not on file

## 2020-12-20 ENCOUNTER — Ambulatory Visit (INDEPENDENT_AMBULATORY_CARE_PROVIDER_SITE_OTHER): Payer: Medicaid Other | Admitting: Nurse Practitioner

## 2020-12-20 ENCOUNTER — Other Ambulatory Visit: Payer: Self-pay

## 2020-12-20 ENCOUNTER — Encounter (INDEPENDENT_AMBULATORY_CARE_PROVIDER_SITE_OTHER): Payer: Self-pay | Admitting: Nurse Practitioner

## 2020-12-20 VITALS — BP 140/88 | HR 90 | Temp 97.3°F | Ht 66.0 in | Wt 186.6 lb

## 2020-12-20 DIAGNOSIS — Z981 Arthrodesis status: Secondary | ICD-10-CM

## 2020-12-20 NOTE — Assessment & Plan Note (Signed)
Improving  Continue neck collar  Continue using cane  Use caution when ambulating  Stay active    Follow up   with ortho as scheduled

## 2020-12-20 NOTE — Patient Instructions (Addendum)
Status post cervical spinal fusion:  Improving  Continue neck collar  Continue using cane  Use caution when ambulating  Stay active    Follow up   with ortho as scheduled

## 2020-12-20 NOTE — Progress Notes (Signed)
@Scott Tucker  ID: , male    DOB: 08/06/1965, 55 y.o.   MRN: [id number]  Chief Complaint  Scott Tucker presents with   Hospitalization Follow-up     Referring provider: [id number], NP  HPI  Scott Tucker presents today for hospital follow-up visit.  Scott Tucker was seen in the hospital on 12/04/2020.  Scott Tucker is status post cervical spinal fusion.  Scott Tucker has had 2 follow-up visits with Ortho since his surgery.  Orthopedic surgeon is pleased with Scott Tucker's progress.  Scott Tucker was advised to follow-up in 5 to 6 weeks again with Ortho for recheck. Denies f/c/s, n/v/d, hemoptysis, PND, chest pain or edema.    Allergies  Allergen Reactions   Latex Rash    Immunization History  Administered Date(s) Administered   PFIZER(Purple Top)SARS-COV-2 Vaccination 06/13/2020, 07/05/2020   Tdap 03/16/2019    Past Medical History:  Diagnosis Date   Anginal pain (HCC)    Anxiety    Chronic back pain    Coronary artery disease    Diabetes mellitus without complication (HCC)    type 2   Dyspnea    Headache    HTN (hypertension)    Neuromuscular disorder (HCC)     Tobacco History: Social History   Tobacco Use  Smoking Status Never  Smokeless Tobacco Never   Counseling given: Yes   Outpatient Encounter Medications as of 12/20/2020  Medication Sig   amLODipine (NORVASC) 10 MG tablet Take 1 tablet (10 mg total) by mouth daily.   aspirin 81 MG EC tablet Take 81 mg by mouth daily.   atorvastatin (LIPITOR) 10 MG tablet Take 1 tablet (10 mg total) by mouth daily at 6 PM.   FLUoxetine (PROZAC) 20 MG capsule Take 1 capsule (20 mg total) by mouth daily.   hydrOXYzine (ATARAX/VISTARIL) 25 MG tablet TAKE 1 TABLET (25 MG TOTAL) BY MOUTH AT BEDTIME AS NEEDED FOR ANXIETY.   losartan (COZAAR) 25 MG tablet Take 1 tablet (25 mg total) by mouth daily. Please make overdue appt with Dr. 02/19/2021 before anymore refills. Thank you 1st attempt   metFORMIN (GLUCOPHAGE XR) 500 MG 24 hr tablet Take 1  tablet (500 mg total) by mouth daily with breakfast.   methocarbamol (ROBAXIN) 500 MG tablet Take 1 tablet (500 mg total) by mouth every 6 (six) hours as needed for muscle spasms.   nitroGLYCERIN (NITROSTAT) 0.4 MG SL tablet Place 1 tablet (0.4 mg total) under the tongue every 5 (five) minutes as needed for chest pain.   oxyCODONE-acetaminophen (PERCOCET) 5-325 MG tablet Take 1-2 tablets by mouth every 6 (six) hours as needed for severe pain.   terbinafine (LAMISIL) 250 MG tablet Take 1 tablet by mouth daily   meclizine (ANTIVERT) 12.5 MG tablet TAKE 1 TABLET (12.5 MG TOTAL) BY MOUTH 3 (THREE) TIMES DAILY AS NEEDED FOR DIZZINESS. (Scott Tucker not taking: Reported on 12/20/2020)   pregabalin (LYRICA) 75 MG capsule TAKE 1 CAPSULE (75 MG TOTAL) BY MOUTH 2 (TWO) TIMES DAILY.   No facility-administered encounter medications on file as of 12/20/2020.     Review of Systems  Review of Systems  Constitutional: Negative.   HENT: Negative.    Respiratory:  Negative for cough and shortness of breath.   Cardiovascular: Negative.   Gastrointestinal: Negative.   Allergic/Immunologic: Negative.   Neurological:  Positive for numbness (bilateral hands - improving).  Psychiatric/Behavioral: Negative.        Physical Exam  BP 140/88 (BP Location: Left Arm, Scott Tucker Position: Sitting, Cuff Size: Normal)   Pulse 90  Temp (!) 97.3 F (36.3 C) (Temporal)   Ht 5\' 6"  (1.676 m)   Wt 186 lb 9.6 oz (84.6 kg)   SpO2 97%   BMI 30.12 kg/m   Wt Readings from Last 5 Encounters:  12/20/20 186 lb 9.6 oz (84.6 kg)  12/18/20 192 lb (87.1 kg)  12/12/20 192 lb (87.1 kg)  12/04/20 192 lb 14.4 oz (87.5 kg)  11/30/20 192 lb 3.2 oz (87.2 kg)     Physical Exam Vitals and nursing note reviewed.  Constitutional:      General: He is not in acute distress.    Appearance: He is well-developed.  Cardiovascular:     Rate and Rhythm: Normal rate and regular rhythm.  Pulmonary:     Effort: Pulmonary effort is normal.      Breath sounds: Normal breath sounds.  Skin:    General: Skin is warm and dry.  Neurological:     Mental Status: He is alert and oriented to person, place, and time.      Assessment & Plan:   S/P cervical spinal fusion Improving  Continue neck collar  Continue using cane  Use caution when ambulating  Stay active    Follow up   with ortho as scheduled      12/02/20, NP 12/20/2020

## 2021-01-02 NOTE — Progress Notes (Signed)
55 year old male with history of C3-4 and C4-5 HNP/gnosis comes in for preop evaluation.  States that neck pain and upper extremity radiculopathy unchanged from previous visit.  He is wanting to proceed with C3-4 and C4-5 ACDF is scheduled.  History and physical performed.  Review of systems negative.  Surgical procedure discussed and all questions answered.

## 2021-01-22 ENCOUNTER — Ambulatory Visit (INDEPENDENT_AMBULATORY_CARE_PROVIDER_SITE_OTHER): Payer: Medicaid Other | Admitting: Primary Care

## 2021-01-23 ENCOUNTER — Encounter: Payer: Self-pay | Admitting: Orthopaedic Surgery

## 2021-01-23 ENCOUNTER — Ambulatory Visit (INDEPENDENT_AMBULATORY_CARE_PROVIDER_SITE_OTHER): Payer: Medicare Other | Admitting: Orthopaedic Surgery

## 2021-01-23 ENCOUNTER — Ambulatory Visit: Payer: Medicare Other

## 2021-01-23 ENCOUNTER — Other Ambulatory Visit: Payer: Self-pay

## 2021-01-23 VITALS — BP 147/89 | HR 85 | Ht 66.0 in | Wt 192.0 lb

## 2021-01-23 DIAGNOSIS — G959 Disease of spinal cord, unspecified: Secondary | ICD-10-CM

## 2021-01-23 DIAGNOSIS — M5412 Radiculopathy, cervical region: Secondary | ICD-10-CM

## 2021-01-23 DIAGNOSIS — Z981 Arthrodesis status: Secondary | ICD-10-CM

## 2021-01-23 NOTE — Progress Notes (Signed)
Post-Op Visit Note   Patient: Scott Tucker           Date of Birth: November 08, 1965           MRN: [id number] Visit Date: 01/23/2021 PCP: Grayce Sessions, NP   Assessment & Plan: Follow-up two-level cervical fusion at C3-4 and C4-5 for severe stenosis with myelopathy.  He is Press photographer with a cane.  Disabled from cardiac problems and back problems in the lumbar region.  Had improvement postop.  Still has some soreness in his shoulder he had 1 fall.  Continue use of cane and he will be careful with wet surfaces and uneven lies.  Chief Complaint:  Chief Complaint  Patient presents with   Neck - Follow-up    12/04/2020 C3-4,C4-5 ACDF   Visit Diagnoses:  1. S/P cervical spinal fusion   2. Cervical myelopathy with cervical radiculopathy (HCC)     Plan: X-rays show no motion.  Discontinue collar.  Return in 2 months with single lateral x-ray C-spine.  He has stenosis at L4-5 above previous L5-S1 fusion.  Surgery was by Dr. Gerlene Fee and L5-S1 in 2005.  Patient has L4-5 stenosis with listhesis and will need additional adjacent level decompression and fusion at L4-5. On return visit single lateral x-ray cervical spine and AP lateral lumbar spine images on return. Follow-Up Instructions: No follow-ups on file.   Orders:  Orders Placed This Encounter  Procedures   XR Cervical Spine 2 or 3 views   No orders of the defined types were placed in this encounter.   Imaging: No results found.  PMFS History: Patient Active Problem List   Diagnosis Date Noted   S/P cervical spinal fusion 12/18/2020   Cervical stenosis of spine 12/04/2020   Chronic radicular cervical pain 05/01/2020   Foraminal stenosis of cervical region (right) 05/01/2020   Chronic pain syndrome 05/01/2020   Cervical myelopathy with cervical radiculopathy (HCC) 05/01/2020   History of lumbar fusion 05/01/2020   HTN (hypertension)    Diabetes mellitus without complication (HCC)    Chronic back pain    Coronary artery  disease    CAD (coronary artery disease) 03/22/2019   Hypertension 02/23/2019   Hyperlipidemia 02/23/2019   Chest pain 02/22/2019   Past Medical History:  Diagnosis Date   Anginal pain (HCC)    Anxiety    Chronic back pain    Coronary artery disease    Diabetes mellitus without complication (HCC)    type 2   Dyspnea    Headache    HTN (hypertension)    Neuromuscular disorder (HCC)     Family History  Problem Relation Age of Onset   Heart disease Maternal Uncle     Past Surgical History:  Procedure Laterality Date   ANTERIOR CERVICAL DECOMP/DISCECTOMY FUSION N/A 12/04/2020   Procedure: ANTERIOR CERVICAL DISCECTOMY FUSION C3-4, C4-5, ALLOGRAFT, PLATE;  Surgeon: Eldred Manges, MD;  Location: [facility] OR;  Service: Orthopedics;  Laterality: N/A;  needs RNFA   BACK SURGERY     1992 and 2003(dr kritzer)   CARDIAC CATHETERIZATION     SHOULDER SURGERY Right    2017   TONSILLECTOMY     removed as a child   Social History   Occupational History   Not on file  Tobacco Use   Smoking status: Never   Smokeless tobacco: Never  Vaping Use   Vaping Use: Never used  Substance and Sexual Activity   Alcohol use: Yes    Comment: 1-2 glasses a  day   Drug use: Yes    Types: Marijuana    Comment: 3 times a day   Sexual activity: Not on file

## 2021-01-24 ENCOUNTER — Ambulatory Visit: Payer: Medicaid Other | Admitting: Physician Assistant

## 2021-01-25 ENCOUNTER — Ambulatory Visit (INDEPENDENT_AMBULATORY_CARE_PROVIDER_SITE_OTHER): Payer: Medicare Other | Admitting: Primary Care

## 2021-01-25 ENCOUNTER — Encounter (INDEPENDENT_AMBULATORY_CARE_PROVIDER_SITE_OTHER): Payer: Self-pay | Admitting: Primary Care

## 2021-01-25 ENCOUNTER — Other Ambulatory Visit: Payer: Self-pay

## 2021-01-25 VITALS — BP 143/91 | HR 65 | Temp 97.5°F | Ht 66.0 in | Wt 191.4 lb

## 2021-01-25 DIAGNOSIS — Z760 Encounter for issue of repeat prescription: Secondary | ICD-10-CM | POA: Diagnosis not present

## 2021-01-25 DIAGNOSIS — E119 Type 2 diabetes mellitus without complications: Secondary | ICD-10-CM

## 2021-01-25 DIAGNOSIS — I10 Essential (primary) hypertension: Secondary | ICD-10-CM

## 2021-01-25 LAB — POCT GLYCOSYLATED HEMOGLOBIN (HGB A1C): HbA1c, POC (prediabetic range): 6 % (ref 5.7–6.4)

## 2021-01-25 LAB — GLUCOSE, POCT (MANUAL RESULT ENTRY): POC Glucose: 138 mg/dl — AB (ref 70–99)

## 2021-01-25 MED ORDER — FLUOXETINE HCL 20 MG PO CAPS
20.0000 mg | ORAL_CAPSULE | Freq: Every day | ORAL | 1 refills | Status: DC
  Filled 2021-01-25: qty 30, 30d supply, fill #0

## 2021-01-25 MED ORDER — HYDROXYZINE HCL 25 MG PO TABS
25.0000 mg | ORAL_TABLET | Freq: Three times a day (TID) | ORAL | 0 refills | Status: DC | PRN
  Filled 2021-01-25: qty 90, 30d supply, fill #0

## 2021-01-25 MED ORDER — CYCLOBENZAPRINE HCL 10 MG PO TABS
10.0000 mg | ORAL_TABLET | Freq: Three times a day (TID) | ORAL | 1 refills | Status: DC | PRN
  Filled 2021-01-25 – 2021-02-21 (×3): qty 90, 30d supply, fill #0

## 2021-01-25 MED ORDER — METFORMIN HCL ER 500 MG PO TB24
500.0000 mg | ORAL_TABLET | Freq: Every day | ORAL | 1 refills | Status: DC
  Filled 2021-01-25: qty 30, 30d supply, fill #0

## 2021-01-25 MED ORDER — AMLODIPINE BESYLATE 10 MG PO TABS
10.0000 mg | ORAL_TABLET | Freq: Every day | ORAL | 1 refills | Status: DC
  Filled 2021-01-25: qty 30, 30d supply, fill #0

## 2021-01-25 MED ORDER — LOSARTAN POTASSIUM 25 MG PO TABS
25.0000 mg | ORAL_TABLET | Freq: Every day | ORAL | 1 refills | Status: DC
  Filled 2021-01-25: qty 30, 30d supply, fill #0

## 2021-01-25 NOTE — Patient Instructions (Signed)

## 2021-01-28 NOTE — Progress Notes (Signed)
[facility]   Scott Tucker is a 55 y.o. male presents for hypertension evaluation, Denies shortness of breath, headaches, chest pain or lower extremity edema, sudden onset, vision changes, unilateral weakness, dizziness, paresthesias. Also, diabetes management denies polyuria, polyphagia, polydipsia and vision changes  Patient reports adherence with medications.  Dietary habits include: Monitoring carbohydrates and sodium intake Exercise habits include: Walking  family / Social history: No   Past Medical History:  Diagnosis Date   Anginal pain (HCC)    Anxiety    Chronic back pain    Coronary artery disease    Diabetes mellitus without complication (HCC)    type 2   Dyspnea    Headache    HTN (hypertension)    Neuromuscular disorder (HCC)    Past Surgical History:  Procedure Laterality Date   ANTERIOR CERVICAL DECOMP/DISCECTOMY FUSION N/A 12/04/2020   Procedure: ANTERIOR CERVICAL DISCECTOMY FUSION C3-4, C4-5, ALLOGRAFT, PLATE;  Surgeon: Eldred Manges, MD;  Location: [facility];  Service: Orthopedics;  Laterality: N/A;  needs RNFA   BACK SURGERY     1992 and 2003(dr kritzer)   CARDIAC CATHETERIZATION     SHOULDER SURGERY Right    2017   TONSILLECTOMY     removed as a child   Allergies  Allergen Reactions   Latex Rash   Current Outpatient Medications on File Prior to Visit  Medication Sig Dispense Refill   aspirin 81 MG EC tablet Take 81 mg by mouth daily.     atorvastatin (LIPITOR) 10 MG tablet Take 1 tablet (10 mg total) by mouth daily at 6 PM.     nitroGLYCERIN (NITROSTAT) 0.4 MG SL tablet Place 1 tablet (0.4 mg total) under the tongue every 5 (five) minutes as needed for chest pain. 75 tablet 1   terbinafine (LAMISIL) 250 MG tablet Take 1 tablet by mouth daily 30 tablet 1   meclizine (ANTIVERT) 12.5 MG tablet TAKE 1 TABLET (12.5 MG TOTAL) BY MOUTH 3 (THREE) TIMES DAILY AS NEEDED FOR DIZZINESS. (Patient not taking: No sig reported) 30 tablet 0   No  current facility-administered medications on file prior to visit.   Social History   Socioeconomic History   Marital status: Single    Spouse name: Not on file   Number of children: Not on file   Years of education: Not on file   Highest education level: Not on file  Occupational History   Not on file  Tobacco Use   Smoking status: Never   Smokeless tobacco: Never  Vaping Use   Vaping Use: Never used  Substance and Sexual Activity   Alcohol use: Yes    Comment: 1-2 glasses a day   Drug use: Yes    Types: Marijuana    Comment: 3 times a day   Sexual activity: Not on file  Other Topics Concern   Not on file  Social History Narrative   Not on file   Social Determinants of Health   Financial Resource Strain: Not on file  Food Insecurity: Not on file  Transportation Needs: Not on file  Physical Activity: Not on file  Stress: Not on file  Social Connections: Not on file  Intimate Partner Violence: Not on file   Family History  Problem Relation Age of Onset   Heart disease Maternal Uncle      OBJECTIVE:  Vitals:   01/25/21 1104 01/25/21 1119  BP: (!) 148/90 (!) 143/91  Pulse: 72 65  Temp: (!) 97.5 F (36.4 C)  TempSrc: Temporal   SpO2: 96%   Weight: 191 lb 6.4 oz (86.8 kg)   Height: 5\' 6"  (1.676 m)     Physical Exam Vitals reviewed.  Constitutional:      Appearance: He is obese.  HENT:     Head: Normocephalic.     Right Ear: Tympanic membrane normal.     Left Ear: Tympanic membrane normal.     Nose: Nose normal.  Cardiovascular:     Rate and Rhythm: Normal rate and regular rhythm.  Pulmonary:     Effort: Pulmonary effort is normal.     Breath sounds: Normal breath sounds.  Abdominal:     General: Bowel sounds are normal. There is distension.     Palpations: Abdomen is soft.  Musculoskeletal:        General: Normal range of motion.  Skin:    General: Skin is warm and dry.  Neurological:     Mental Status: He is alert and oriented to person,  place, and time.  Psychiatric:        Mood and Affect: Mood normal.        Behavior: Behavior normal.        Thought Content: Thought content normal.    ROS Complete comprehensive review of systems noted in HPI Last 3 Office BP readings: BP Readings from Last 3 Encounters:  01/25/21 (!) 143/91  01/23/21 (!) 147/89  12/20/20 140/88    BMET    Component Value Date/Time   NA 135 12/05/2020 0432   NA 142 07/27/2020 1128   K 4.0 12/05/2020 0432   CL 103 12/05/2020 0432   CO2 24 12/05/2020 0432   GLUCOSE 139 (H) 12/05/2020 0432   BUN 8 12/05/2020 0432   BUN 8 07/27/2020 1128   CREATININE 0.53 (L) 12/05/2020 0432   CALCIUM 9.0 12/05/2020 0432   GFRNONAA >60 12/05/2020 0432   GFRAA 128 12/22/2019 1100    Renal function: CrCl cannot be calculated (Patient's most recent lab result is older than the maximum 21 days allowed.).  Clinical ASCVD: Yes  The 10-year ASCVD risk score (Arnett DK, et al., 2019) is: 22.2%   Values used to calculate the score:     Age: 71 years     Sex: Male     Is Non-Hispanic African American: Yes     Diabetic: Yes     Tobacco smoker: No     Systolic Blood Pressure: 143 mmHg     Is BP treated: Yes     HDL Cholesterol: 46 mg/dL     Total Cholesterol: 138 mg/dL  ASCVD risk factors include- 53  Meds ordered this encounter  Medications   FLUoxetine (PROZAC) 20 MG capsule    Sig: Take 1 capsule (20 mg total) by mouth daily.    Dispense:  90 capsule    Refill:  1   losartan (COZAAR) 25 MG tablet    Sig: Take 1 tablet (25 mg total) by mouth daily. Please make overdue appt with Dr. [place] before anymore refills. Thank you 1st attempt    Dispense:  90 tablet    Refill:  1    Please call our office to schedule an overdue appointment with Dr. Eldridge Dace before anymore refills. [phone]. Thank you 1st attempt   metFORMIN (GLUCOPHAGE XR) 500 MG 24 hr tablet    Sig: Take 1 tablet (500 mg total) by mouth daily with breakfast.    Dispense:  90 tablet     Refill:  1  amLODipine (NORVASC) 10 MG tablet    Sig: Take 1 tablet (10 mg total) by mouth daily.    Dispense:  90 tablet    Refill:  1   hydrOXYzine (ATARAX/VISTARIL) 25 MG tablet    Sig: Take 1 tablet (25 mg total) by mouth every 8 (eight) hours as needed.    Dispense:  90 tablet    Refill:  0   cyclobenzaprine (FLEXERIL) 10 MG tablet    Sig: Take 1 tablet (10 mg total) by mouth 3 (three) times daily as needed for muscle spasms.    Dispense:  90 tablet    Refill:  1   ASSESSMENT & PLAN:  Essential hypertension-Counseled on lifestyle modifications for blood pressure control including reduced dietary sodium, increased exercise, weight reduction and adequate sleep. Also, educated patient about the risk for cardiovascular events, stroke and heart attack. Also counseled patient about the importance of medication adherence. If you participate in smoking, it is important to stop using tobacco as this will increase the risks associated with uncontrolled blood pressure.   -Hypertension longstanding diagnosed currently amlodipine 10 mg losartan 25 mg on current medications. Patient is adherent with current medications.   Goal BP:  For patients younger than 60: Goal BP < 130/80. For patients 60 and older: Goal BP < 140/90. For patients with diabetes: Goal BP < 130/80. Your most recent BP: 143/93  Minimize salt intake. Minimize alcohol intake  Type 2 diabetes mellitus without complication, without long-term current use of insulin (HCC) 6 months ago A1c was 8.6, 3 months from that 6.1 today.  Well-controlled ,continue monitoring foods that are high in carbohydrates are the following rice, potatoes, breads, sugars, and pastas.  Reduction in the intake (eating) will assist in lowering your blood sugars.  -     HgB A1c 6.0 -     Glucose (CBG) No changes in medication continue metFORMIN (GLUCOPHAGE XR) 500 MG 24 hr tablet; Take 1 tablet (500 mg total) by mouth daily with breakfast.   Other  orders/Medication refill -     FLUoxetine (PROZAC) 20 MG capsule; Take 1 capsule (20 mg total) by mouth daily. -     losartan (COZAAR) 25 MG tablet; Take 1 tablet (25 mg total) by mouth daily. Please make overdue appt with Dr. Eldridge Dace before anymore refills. Thank you 1st attempt -     metFORMIN (GLUCOPHAGE XR) 500 MG 24 hr tablet; Take 1 tablet (500 mg total) by mouth daily with breakfast. -     hydrOXYzine (ATARAX/VISTARIL) 25 MG tablet; Take 1 tablet (25 mg total) by mouth every 8 (eight) hours as needed. -     cyclobenzaprine (FLEXERIL) 10 MG tablet; Take 1 tablet (10 mg total) by mouth 3 (three) times daily as needed for muscle spasms.     This note has been created with Education officer, environmental. Any transcriptional errors are unintentional.   Grayce Sessions, NP 01/28/2021, 10:22 PM

## 2021-02-01 ENCOUNTER — Other Ambulatory Visit: Payer: Self-pay

## 2021-02-12 ENCOUNTER — Other Ambulatory Visit: Payer: Self-pay

## 2021-02-19 ENCOUNTER — Other Ambulatory Visit: Payer: Self-pay

## 2021-02-21 ENCOUNTER — Other Ambulatory Visit: Payer: Self-pay

## 2021-02-22 ENCOUNTER — Ambulatory Visit (INDEPENDENT_AMBULATORY_CARE_PROVIDER_SITE_OTHER): Payer: Self-pay

## 2021-02-22 NOTE — Telephone Encounter (Signed)
No availability with [facility]. Per Gal[place] in the practice, pt. Should call Primary Care at [facility] for appointment. Left detailed message for pt. With phone number to call.

## 2021-02-22 NOTE — Telephone Encounter (Signed)
Called patient and scheduled a virtual appt with Tonya over at [facility] on 02/23/21 at 8:40am.

## 2021-02-22 NOTE — Telephone Encounter (Signed)
Pt. Noticed yesterday 2 knots about quarter sized behind his left knee. Painful to touch and has pain when walking.No availability in the practice today.Will send triage to [facility] for assistance.    Reason for Disposition  [1] Thigh or calf pain AND [2] only 1 side AND [3] present > 1 hour (Exception: chronic unchanged pain)  Answer Assessment - Initial Assessment Questions 1. ONSET: "When did the pain start?"      Yesterday 2. LOCATION: "Where is the pain located?"      Left leg behind knee 3. PAIN: "How bad is the pain?"    (Scale 1-10; or mild, moderate, severe)   -  MILD (1-3): doesn't interfere with normal activities    -  MODERATE (4-7): interferes with normal activities (e.g., work or school) or awakens from sleep, limping    -  SEVERE (8-10): excruciating pain, unable to do any normal activities, unable to walk     10 4. WORK OR EXERCISE: "Has there been any recent work or exercise that involved this part of the body?"      No 5. CAUSE: "What do you think is causing the leg pain?"     Unsure 6. OTHER SYMPTOMS: "Do you have any other symptoms?" (e.g., chest pain, back pain, breathing difficulty, swelling, rash, fever, numbness, weakness)     No 7. PREGNANCY: "Is there any chance you are pregnant?" "When was your last menstrual period?"     N/A  Protocols used: Leg Pain-A-AH

## 2021-02-23 ENCOUNTER — Encounter: Payer: Self-pay | Admitting: Nurse Practitioner

## 2021-02-23 ENCOUNTER — Other Ambulatory Visit: Payer: Self-pay

## 2021-02-23 ENCOUNTER — Telehealth: Payer: Self-pay | Admitting: *Deleted

## 2021-02-23 ENCOUNTER — Telehealth (INDEPENDENT_AMBULATORY_CARE_PROVIDER_SITE_OTHER): Payer: Medicare Other | Admitting: Nurse Practitioner

## 2021-02-23 ENCOUNTER — Ambulatory Visit (HOSPITAL_COMMUNITY)
Admission: RE | Admit: 2021-02-23 | Discharge: 2021-02-23 | Disposition: A | Discharge status: Home / Self Care | Payer: Medicare Other | Source: Ambulatory Visit | Attending: Nurse Practitioner | Admitting: Nurse Practitioner

## 2021-02-23 DIAGNOSIS — M7989 Other specified soft tissue disorders: Secondary | ICD-10-CM | POA: Diagnosis present

## 2021-02-23 DIAGNOSIS — M79662 Pain in left lower leg: Secondary | ICD-10-CM

## 2021-02-23 MED ORDER — CEPHALEXIN 500 MG PO CAPS
500.0000 mg | ORAL_CAPSULE | Freq: Three times a day (TID) | ORAL | 0 refills | Status: AC
  Filled 2021-02-23: qty 21, 7d supply, fill #0

## 2021-02-23 NOTE — Telephone Encounter (Addendum)
left patient MRN and voicemail to return call to schedule [place] at [facility].

## 2021-02-23 NOTE — Progress Notes (Signed)
Patient informed of appointment for [place].  Address given-  [facility]  [address] At 11:00 Advised to call [phone] if he needs to cancel and reschedule appt.

## 2021-02-23 NOTE — Patient Instructions (Signed)
Possible Abscess to posterior left thigh region:  Will order keflex  Will order [place] to rule out DVT  Please go to the ED over the weekend if symptoms worsen  Warm wet compresses  Follow up:  Follow up with PCP in 2 weeks

## 2021-02-23 NOTE — Progress Notes (Signed)
Virtual Visit via Video Note  I connected with Scott Tucker on 02/23/21 at  8:40 AM EDT by a video enabled telemedicine application and verified that I am speaking with the correct person using two identifiers.  Location: Patient: home Provider: office   I discussed the limitations of evaluation and management by telemedicine and the availability of in person appointments. The patient expressed understanding and agreed to proceed.  History of Present Illness:  Patient presents today for acute visit through video visit.  Patient states for the last few days he has been having redness and swelling to his left posterior thigh above the posterior aspect of his knee.  Patient denies any known insect bite.  He does state that the area does have a hard knot.  He denies any drainage from the area.  It is hard to access this through video visit but we could visualize a large red area to his posterior thigh.  We discussed that we can start him on antibiotics if the area gets worse he will need to go to the emergency room for possible I&D.  We will order ultrasound to rule out any type of DVT. Denies f/c/s, n/v/d, hemoptysis, PND, chest pain or edema     Observations/Objective:  Vitals with BMI 01/25/2021 01/25/2021 01/23/2021  Height - 5\' 6"  5\' 6"   Weight - 191 lbs 6 oz 192 lbs  BMI - 30.91 31  Systolic 143 148  Diastolic 91 90 89  Pulse 65 72 85   Skin: Large red area noted on video to left posterior thigh  Assessment and Plan:  Possible Abscess to posterior left thigh region:  Will order keflex  Will order to rule out DVT  Please go to the ED over the weekend if symptoms worsen  Warm wet compresses  Follow up:  Follow up with PCP in 2 weeks    I discussed the assessment and treatment plan with the patient. The patient was provided an opportunity to ask questions and all were answered. The patient agreed with the plan and demonstrated an understanding of the  instructions.   The patient was advised to call back or seek an in-person evaluation if the symptoms worsen or if the condition fails to improve as anticipated.  I provided 23 minutes of non-face-to-face time during this encounter.   242, NP

## 2021-03-17 NOTE — Progress Notes (Signed)
No show

## 2021-03-27 ENCOUNTER — Other Ambulatory Visit: Payer: Self-pay

## 2021-03-27 ENCOUNTER — Ambulatory Visit (INDEPENDENT_AMBULATORY_CARE_PROVIDER_SITE_OTHER): Payer: Medicare Other | Admitting: Orthopaedic Surgery

## 2021-03-27 ENCOUNTER — Encounter: Payer: Self-pay | Admitting: Orthopaedic Surgery

## 2021-03-27 ENCOUNTER — Ambulatory Visit (INDEPENDENT_AMBULATORY_CARE_PROVIDER_SITE_OTHER): Payer: Medicare Other

## 2021-03-27 VITALS — BP 142/89 | Ht 66.0 in | Wt 192.0 lb

## 2021-03-27 DIAGNOSIS — Z981 Arthrodesis status: Secondary | ICD-10-CM | POA: Diagnosis not present

## 2021-03-27 DIAGNOSIS — M545 Low back pain, unspecified: Secondary | ICD-10-CM

## 2021-03-27 DIAGNOSIS — I2510 Atherosclerotic heart disease of native coronary artery without angina pectoris: Secondary | ICD-10-CM | POA: Diagnosis not present

## 2021-03-27 DIAGNOSIS — G8929 Other chronic pain: Secondary | ICD-10-CM

## 2021-03-27 NOTE — Progress Notes (Signed)
Office Visit Note   Patient: Scott Tucker           Date of Birth: Jul 27, 1965           MRN: [id number] Visit Date: 03/27/2021              Requested by: Grayce Sessions, NP [address] [place],  [place] [address] PCP: Grayce Sessions, NP   Assessment & Plan: Visit Diagnoses:  1. S/P cervical spinal fusion   2. Chronic bilateral low back pain, unspecified whether sciatica present     Plan: Continue ambulation follow-up prevention discussed.  We will check him back again in 2 months and we can discuss surgical treatment for his adjacent level stenosis at L4-5.  We reviewed his x-rays today and discussed adjacent level instrumented fusion that would be required for the anterolisthesis 3 mm and multifactorial spinal stenosis at L4-5.  Follow-Up Instructions: Return in about 2 months (around 05/27/2021).   Orders:  Orders Placed This Encounter  Procedures   XR Cervical Spine 2 or 3 views   XR Lumbar Spine 2-3 Views   No orders of the defined types were placed in this encounter.     Procedures: No procedures performed   Clinical Data: No additional findings.   Subjective: Chief Complaint  Patient presents with   Neck - Follow-up    12/04/2020 C3-4, C4-5 ACDF   Lower Back - Pain, Follow-up    HPI 55 year old male returns with preop myelopathy cord changes and fusion at C3-4 and C4-5.  Patient is walking better no longer has a wide-based gait and can walk without his cane which he could not do preoperatively.  Improved sensation in the fingertips with less tingling and improved strength.  He can now stick his arm out in front and can shake hands which she could not do preoperatively.  Patient still has problems with low back pain and has adjacent level stenosis at L4-5 above solid L5-S1 fusion.  Patient has diabetes coronary artery disease.  He has noticed progressive improvement in his gait and sensation and strength of his arms and hands with improvement every  month.  Review of Systems all other systems territory.   Objective: Vital Signs: BP (!) 142/89   Ht 5\' 6"  (1.676 m)   Wt 192 lb (87.1 kg)   BMI 30.99 kg/m   Physical Exam Constitutional:      Appearance: He is well-developed.  HENT:     Head: Normocephalic and atraumatic.     Right Ear: External ear normal.     Left Ear: External ear normal.  Eyes:     Pupils: Pupils are equal, round, and reactive to light.  Neck:     Thyroid: No thyromegaly.     Trachea: No tracheal deviation.  Cardiovascular:     Rate and Rhythm: Normal rate.  Pulmonary:     Effort: Pulmonary effort is normal.     Breath sounds: No wheezing.  Abdominal:     General: Bowel sounds are normal.     Palpations: Abdomen is soft.  Musculoskeletal:     Cervical back: Neck supple.  Skin:    General: Skin is warm and dry.     Capillary Refill: Capillary refill takes less than 2 seconds.  Neurological:     Mental Status: He is alert and oriented to person, place, and time.  Psychiatric:        Behavior: Behavior normal.        Thought  Content: Thought content normal.        Judgment: Judgment normal.    Ortho Exam patient can reach with both arms outstretched in front of him with improved deltoid and pectoralis strength.  Improved biceps grip strength and triceps.  He is amatory can walk without his cane in the office and no longer has a wide-based gait.  Specialty Comments:  No specialty comments available.  Imaging: XR Cervical Spine 2 or 3 views  Result Date: 03/27/2021 Single view lateral cervical spine images obtained and reviewed.  This shows two-level cervical fusion C3-4 and C4-5 with progressive bone graft incorporation.  No screw loosening no subsidence. Impression: Postop C3-C5 two-level cervical fusion as described above.  XR Lumbar Spine 2-3 Views  Result Date: 03/27/2021 2 view lumbar spine x-rays demonstrate previous fusion at L5-S1 with pedicle screws on the left and transverse  setscrew on the right.  Facet degeneration at L4-5 with grade 1 anterolisthesis. Impression: Fusion L5-S1 with adjacent level degenerative changes.    PMFS History: Patient Active Problem List   Diagnosis Date Noted   S/P cervical spinal fusion 12/18/2020   Cervical stenosis of spine 12/04/2020   Chronic radicular cervical pain 05/01/2020   Foraminal stenosis of cervical region (right) 05/01/2020   Chronic pain syndrome 05/01/2020   Cervical myelopathy with cervical radiculopathy ([place]) 05/01/2020   History of lumbar fusion 05/01/2020   HTN (hypertension)    Diabetes mellitus without complication (HCC)    Chronic back pain    Coronary artery disease    CAD (coronary artery disease) 03/22/2019   Hypertension 02/23/2019   Hyperlipidemia 02/23/2019   Chest pain 02/22/2019   Past Medical History:  Diagnosis Date   Anginal pain (HCC)    Anxiety    Chronic back pain    Coronary artery disease    Diabetes mellitus without complication (HCC)    type 2   Dyspnea    Headache    HTN (hypertension)    Neuromuscular disorder (HCC)     Family History  Problem Relation Age of Onset   Heart disease Maternal Uncle     Past Surgical History:  Procedure Laterality Date   ANTERIOR CERVICAL DECOMP/DISCECTOMY FUSION N/A 12/04/2020   Procedure: ANTERIOR CERVICAL DISCECTOMY FUSION C3-4, C4-5, ALLOGRAFT, PLATE;  Surgeon: Marybelle Killings, MD;  Location: [place];  Service: Orthopedics;  Laterality: N/A;  needs [place] and 2003(dr kritzer)   CARDIAC CATHETERIZATION     SHOULDER SURGERY Right    2017   TONSILLECTOMY     removed as a child   Social History   Occupational History   Not on file  Tobacco Use   Smoking status: Never   Smokeless tobacco: Never  Vaping Use   Vaping Use: Never used  Substance and Sexual Activity   Alcohol use: Yes    Comment: 1-2 glasses a day   Drug use: Yes    Types: Marijuana    Comment: 3 times a day   Sexual activity: Not on file

## 2021-04-26 ENCOUNTER — Encounter (INDEPENDENT_AMBULATORY_CARE_PROVIDER_SITE_OTHER): Payer: Self-pay | Admitting: Primary Care

## 2021-04-26 ENCOUNTER — Other Ambulatory Visit: Payer: Self-pay

## 2021-04-26 ENCOUNTER — Ambulatory Visit (INDEPENDENT_AMBULATORY_CARE_PROVIDER_SITE_OTHER): Payer: Medicare Other | Admitting: Primary Care

## 2021-04-26 VITALS — BP 159/96 | HR 96 | Temp 97.9°F | Ht 66.0 in | Wt 207.0 lb

## 2021-04-26 DIAGNOSIS — F32A Depression, unspecified: Secondary | ICD-10-CM | POA: Diagnosis not present

## 2021-04-26 DIAGNOSIS — I10 Essential (primary) hypertension: Secondary | ICD-10-CM

## 2021-04-26 DIAGNOSIS — G894 Chronic pain syndrome: Secondary | ICD-10-CM | POA: Diagnosis not present

## 2021-04-26 DIAGNOSIS — E119 Type 2 diabetes mellitus without complications: Secondary | ICD-10-CM

## 2021-04-26 DIAGNOSIS — Z760 Encounter for issue of repeat prescription: Secondary | ICD-10-CM

## 2021-04-26 MED ORDER — LOSARTAN POTASSIUM 25 MG PO TABS
25.0000 mg | ORAL_TABLET | Freq: Every day | ORAL | 1 refills | Status: DC
Start: 2021-04-26 — End: 2021-10-25
  Filled 2021-04-26 – 2021-10-09 (×2): qty 90, 90d supply, fill #0

## 2021-04-26 MED ORDER — NITROGLYCERIN 0.4 MG SL SUBL
0.4000 mg | SUBLINGUAL_TABLET | SUBLINGUAL | 1 refills | Status: DC | PRN
  Filled 2021-04-26: qty 25, 30d supply, fill #0

## 2021-04-26 MED ORDER — AMLODIPINE BESYLATE 10 MG PO TABS
10.0000 mg | ORAL_TABLET | Freq: Every day | ORAL | 1 refills | Status: DC
  Filled 2021-04-26 – 2021-10-09 (×2): qty 90, 90d supply, fill #0

## 2021-04-26 MED ORDER — METFORMIN HCL ER 500 MG PO TB24
500.0000 mg | ORAL_TABLET | Freq: Every day | ORAL | 1 refills | Status: DC
  Filled 2021-04-26: qty 90, 90d supply, fill #0

## 2021-04-26 MED ORDER — FLUOXETINE HCL 20 MG PO CAPS
20.0000 mg | ORAL_CAPSULE | Freq: Every day | ORAL | 1 refills | Status: DC
  Filled 2021-04-26 – 2021-10-09 (×2): qty 90, 90d supply, fill #0

## 2021-04-26 NOTE — Patient Instructions (Addendum)
Elevated Bp needs to schedule appointment with cardiologist. Last note indicated need to be seen. Corky Crafts, MD Cardiology [facility] [address] [place], [place], [place]  [address] Phone: [phone]; Fax: [phone]

## 2021-04-26 NOTE — Progress Notes (Signed)
[facility]   Scott Tucker is a 55 y.o. male presents for hypertension evaluation, Denies shortness of breath, headaches, chest pain or lower extremity edema, sudden onset, vision changes, unilateral weakness, dizziness, paresthesias . Chronic cervical pain- 10/10- s/p surgical intervention. No alleviating factors. Aggravating factors moving  Patient reports adherence with medications.  Dietary habits include: eating everything not following any diet  Exercise habits include:no- due to constant neck and back pain  Family / Social history: 2nd generation- aunts /uncle HTN/DM   Past Medical History:  Diagnosis Date   Anginal pain (HCC)    Anxiety    Chronic back pain    Coronary artery disease    Diabetes mellitus without complication (HCC)    type 2   Dyspnea    Headache    HTN (hypertension)    Neuromuscular disorder (HCC)    Past Surgical History:  Procedure Laterality Date   ANTERIOR CERVICAL DECOMP/DISCECTOMY FUSION N/A 12/04/2020   Procedure: ANTERIOR CERVICAL DISCECTOMY FUSION C3-4, C4-5, ALLOGRAFT, PLATE;  Surgeon: Eldred Manges, MD;  Location: [facility] OR;  Service: Orthopedics;  Laterality: N/A;  needs RNFA   BACK SURGERY     1992 and 2003(dr kritzer)   CARDIAC CATHETERIZATION     SHOULDER SURGERY Right    2017   TONSILLECTOMY     removed as a child   Allergies  Allergen Reactions   Latex Rash   Current Outpatient Medications on File Prior to Visit  Medication Sig Dispense Refill   amLODipine (NORVASC) 10 MG tablet Take 1 tablet (10 mg total) by mouth daily. 90 tablet 1   aspirin 81 MG EC tablet Take 81 mg by mouth daily.     atorvastatin (LIPITOR) 10 MG tablet Take 1 tablet (10 mg total) by mouth daily at 6 PM.     cyclobenzaprine (FLEXERIL) 10 MG tablet Take 1 tablet (10 mg total) by mouth 3 (three) times daily as needed for muscle spasms. 90 tablet 1   FLUoxetine (PROZAC) 20 MG capsule Take 1 capsule (20 mg total) by mouth daily. 90 capsule 1    hydrOXYzine (ATARAX/VISTARIL) 25 MG tablet Take 1 tablet (25 mg total) by mouth every 8 (eight) hours as needed. 90 tablet 0   losartan (COZAAR) 25 MG tablet Take 1 tablet (25 mg total) by mouth daily. Please make overdue appt with Dr. Eldridge Dace before anymore refills. Thank you 1st attempt 90 tablet 1   meclizine (ANTIVERT) 12.5 MG tablet TAKE 1 TABLET (12.5 MG TOTAL) BY MOUTH 3 (THREE) TIMES DAILY AS NEEDED FOR DIZZINESS. (Patient not taking: No sig reported) 30 tablet 0   metFORMIN (GLUCOPHAGE XR) 500 MG 24 hr tablet Take 1 tablet (500 mg total) by mouth daily with breakfast. 90 tablet 1   nitroGLYCERIN (NITROSTAT) 0.4 MG SL tablet Place 1 tablet (0.4 mg total) under the tongue every 5 (five) minutes as needed for chest pain. (Patient not taking: Reported on 04/26/2021) 75 tablet 1   terbinafine (LAMISIL) 250 MG tablet Take 1 tablet by mouth daily 30 tablet 1   No current facility-administered medications on file prior to visit.   Social History   Socioeconomic History   Marital status: Single    Spouse name: Not on file   Number of children: Not on file   Years of education: Not on file   Highest education level: Not on file  Occupational History   Not on file  Tobacco Use   Smoking status: Never   Smokeless tobacco:  Never  Vaping Use   Vaping Use: Never used  Substance and Sexual Activity   Alcohol use: Yes    Comment: 1-2 glasses a day   Drug use: Yes    Types: Marijuana    Comment: 3 times a day   Sexual activity: Not on file  Other Topics Concern   Not on file  Social History Narrative   Not on file   Social Determinants of Health   Financial Resource Strain: Not on file  Food Insecurity: Not on file  Transportation Needs: Not on file  Physical Activity: Not on file  Stress: Not on file  Social Connections: Not on file  Intimate Partner Violence: Not on file   Family History  Problem Relation Age of Onset   Heart disease Maternal Uncle       OBJECTIVE:  Vitals:   04/26/21 0827  BP: (!) 159/96  Pulse: 96  Temp: 97.9 F (36.6 C)  SpO2: 95%  Weight: 207 lb (93.9 kg)  Height: 5\' 6"  (1.676 m)   Physical exam: General: Vital signs reviewed.  Patient is well-developed and well-nourished, xx in no acute distress and cooperative with exam. Head: Normocephalic and atraumatic. Eyes: EOMI, conjunctivae normal, no scleral icterus. Neck: Supple, trachea midline, normal ROM, no JVD, masses, thyromegaly, or carotid bruit present. Cardiovascular: RRR, S1 normal, S2 normal, no murmurs, gallops, or rubs. Pulmonary/Chest: Clear to auscultation bilaterally, no wheezes, rales, or rhonchi. Abdominal: Soft, non-tender, non-distended, BS +, no masses, organomegaly, or guarding present. Musculoskeletal: No joint deformities, erythema, or stiffness, ROM full and nontender. Extremities: No lower extremity edema bilaterally,  pulses symmetric and intact bilaterally. No cyanosis or clubbing. Neurological: A&O x3, Strength is normal Skin: Warm, dry and intact. No rashes or erythema. Psychiatric: Normal mood and affect. speech and behavior is normal. Cognition and memory are normal.     ROS Comprehensive review of systems pertinent positives and negatives noted in HPI Last 3 Office BP readings: BP Readings from Last 3 Encounters:  04/26/21 (!) 159/96  03/27/21 (!) 142/89  01/25/21 (!) 143/91    BMET    Component Value Date/Time   NA 135 12/05/2020 0432   NA 142 07/27/2020 1128   K 4.0 12/05/2020 0432   CL 103 12/05/2020 0432   CO2 24 12/05/2020 0432   GLUCOSE 139 (H) 12/05/2020 0432   BUN 8 12/05/2020 0432   BUN 8 07/27/2020 1128   CREATININE 0.53 (L) 12/05/2020 0432   CALCIUM 9.0 12/05/2020 0432   GFRNONAA >60 12/05/2020 0432   GFRAA 128 12/22/2019 1100    Renal function: CrCl cannot be calculated (Patient's most recent lab result is older than the maximum 21 days allowed.).  Clinical ASCVD: Yes  The 10-year ASCVD risk  score (Arnett DK, et al., 2019) is: 26.5%   Values used to calculate the score:     Age: 67 years     Sex: Male     Is Non-Hispanic African American: Yes     Diabetic: Yes     Tobacco smoker: No     Systolic Blood Pressure: 159 mmHg     Is BP treated: Yes     HDL Cholesterol: 46 mg/dL     Total Cholesterol: 138 mg/dL  ASCVD risk factors include- 53   ASSESSMENT & PLAN: Scott Tucker was seen today for hypertension and medication refill.  Diagnoses and all orders for this visit:  Essential hypertension -Counseled on lifestyle modifications for blood pressure control including reduced dietary sodium, increased exercise,  weight reduction and adequate sleep. Also, educated patient about the risk for cardiovascular events, stroke and heart attack. Also counseled patient about the importance of medication adherence. If you participate in smoking, it is important to stop using tobacco as this will increase the risks associated with uncontrolled blood pressure.   -Hypertension longstanding diagnosed currently losartan and  on current medications. Patient is adherent with current medications.   Goal BP:  For patients younger than 60: Goal BP < 130/80. For patients 60 and older: Goal BP < 140/90. For patients with diabetes: Goal BP < 130/80. Your most recent BP: 156/96  Minimize salt intake. Minimize alcohol intake   Diabetes mellitus without complication (HCC) Well controlled A1C 6.0. ADA  Continue to monitor foods that are high in carbohydrates are the following rice, potatoes, breads, sugars, and pastas.  Reduction in the intake (eating) will assist in lowering your blood sugars.  -     metFORMIN (GLUCOPHAGE XR) 500 MG 24 hr tablet; Take 1 tablet (500 mg total) by mouth daily with breakfast.   Chronic pain syndrome Refer to pain management also followed by ortho for cervical pain  Depression, unspecified depression type -     FLUoxetine (PROZAC) 20 MG capsule; Take 1 capsule (20 mg  total) by mouth daily.   Medication refill -     amLODipine (NORVASC) 10 MG tablet; Take 1 tablet (10 mg total) by mouth daily. -     FLUoxetine (PROZAC) 20 MG capsule; Take 1 capsule (20 mg total) by mouth daily. -     losartan (COZAAR) 25 MG tablet; Take 1 tablet (25 mg total) by mouth daily. Please make overdue appt with Dr. Eldridge Dace before anymore refills. Thank you 1st attempt -     metFORMIN (GLUCOPHAGE XR) 500 MG 24 hr tablet; Take 1 tablet (500 mg total) by mouth daily with breakfast. -     nitroGLYCERIN (NITROSTAT) 0.4 MG SL tablet; Place 1 tablet (0.4 mg total) under the tongue every 5 (five) minutes as needed for chest pain.    This note has been created with Education officer, environmental. Any transcriptional errors are unintentional.   Grayce Sessions, NP 04/26/2021, 8:44 AM

## 2021-04-26 NOTE — Progress Notes (Signed)
Neck and back pain constantly Has not taken any antihypertensives this morning

## 2021-05-02 ENCOUNTER — Other Ambulatory Visit: Payer: Self-pay

## 2021-05-29 ENCOUNTER — Other Ambulatory Visit: Payer: Self-pay

## 2021-05-29 ENCOUNTER — Ambulatory Visit (INDEPENDENT_AMBULATORY_CARE_PROVIDER_SITE_OTHER): Payer: Medicare Other | Admitting: Orthopaedic Surgery

## 2021-05-29 DIAGNOSIS — M48062 Spinal stenosis, lumbar region with neurogenic claudication: Secondary | ICD-10-CM | POA: Diagnosis not present

## 2021-05-30 DIAGNOSIS — M48061 Spinal stenosis, lumbar region without neurogenic claudication: Secondary | ICD-10-CM | POA: Insufficient documentation

## 2021-05-30 NOTE — Progress Notes (Signed)
Office Visit Note   Patient: Scott Tucker           Date of Birth: 1966/04/12           MRN: [id number] Visit Date: 05/29/2021              Requested by: Grayce Sessions, NP [address] [place],  [place] [address] PCP: Grayce Sessions, NP   Assessment & Plan: Visit Diagnoses:  1. Spinal stenosis of lumbar region with neurogenic claudication     Plan: We will have him see my partner Dr. Otelia Sergeant to consider adjacent level fusion at L4-5.  Reviewed previous scans with him.  He is happy the results of his cervical fusion.  Follow-Up Instructions: No follow-ups on file.   Orders:  No orders of the defined types were placed in this encounter.  No orders of the defined types were placed in this encounter.     Procedures: No procedures performed   Clinical Data: No additional findings.   Subjective: Chief Complaint  Patient presents with   Neck - Follow-up    HPI 56 year old male returns after two-level cervical fusion C3-4 C4-5 where he had cord flattening stenosis and myelopathy as well as upper extremity radiculopathy.  He has done well after cervical fusion and now returns for dressing the lumbar spinal stenosis at L4-5 above the area where he had previous surgery by Dr.Kritzer with L5-S1 instrumented fusion on 01/27/2004.  Patient has pedicle screws on 1 side and a transfer set screw on the opposite side.  He has now developed L4-5 3 mm degenerative anterolisthesis with multifactorial spinal stenosis at L4-5.  Patient states he is ready for lumbar surgery due to his claudication symptoms.  Review of Systems patient has hypertension his coronary disease and diabetes with last A1c of 6.1.  All other systems noncontributory contributory.   Objective: Vital Signs: BP (!) 148/87    Pulse 92    Ht 5\' 6"  (1.676 m)    Wt 207 lb (93.9 kg)    BMI 33.41 kg/m   Physical Exam Constitutional:      Appearance: He is well-developed.  HENT:     Head: Normocephalic and  atraumatic.     Right Ear: External ear normal.     Left Ear: External ear normal.  Eyes:     Pupils: Pupils are equal, round, and reactive to light.  Neck:     Thyroid: No thyromegaly.     Trachea: No tracheal deviation.  Cardiovascular:     Rate and Rhythm: Normal rate.  Pulmonary:     Effort: Pulmonary effort is normal.     Breath sounds: No wheezing.  Abdominal:     General: Bowel sounds are normal.     Palpations: Abdomen is soft.  Musculoskeletal:     Cervical back: Neck supple.  Skin:    General: Skin is warm and dry.     Capillary Refill: Capillary refill takes less than 2 seconds.  Neurological:     Mental Status: He is alert and oriented to person, place, and time.  Psychiatric:        Behavior: Behavior normal.        Thought Content: Thought content normal.        Judgment: Judgment normal.    Ortho Exam well-healed lumbar incision.  Well-healed cervical incision.  He has some sciatic notch tenderness well-healed lumbar incision.  Specialty Comments:  No specialty comments available.  Imaging: Narrative & Impression  CLINICAL DATA:  Low back pain.  Previous fusion surgery.   EXAM: MRI LUMBAR SPINE WITHOUT AND WITH CONTRAST   TECHNIQUE: Multiplanar and multiecho pulse sequences of the lumbar spine were obtained without and with intravenous contrast.   CONTRAST:  8mL GADAVIST GADOBUTROL 1 MMOL/ML IV SOLN   COMPARISON:  08/29/2004   FINDINGS: Segmentation:  5 lumbar type vertebral bodies.   Alignment:  3 mm degenerative anterolisthesis L4-5.   Vertebrae:  Previous PLIF procedure L5-S1.   Conus medullaris and cauda equina: Conus extends to the L1 level. Conus and cauda equina appear normal.   Paraspinal and other soft tissues: Negative   Disc levels:   T11-12: Mild bulging of the disc. Mild facet and ligamentous hypertrophy. No compressive stenosis.   T12-L1 through L2-3: Normal   L3-4: Minimal bilateral bulging of the disc. No compressive  canal or foraminal narrowing.   L4-5: Bilateral facet degeneration with facet and ligamentous hypertrophy. 3 mm of degenerative anterolisthesis. Shallow protrusion of the disc, slightly more prominent towards the left. Multifactorial spinal stenosis at this level that could cause neural compression on either or both sides facet arthritis shows edema and enhancement which could relate to low back pain or referred facet syndrome pain. The appearance could worsen with standing or flexion.   L5-S1: Distant PLIF. No compressive narrowing of the canal or foramina.   IMPRESSION: 1. Good appearance at the decompression and fusion level of L5-S1. 2. Pronounced worsening of adjacent segment degenerative disease at L4-5. Bilateral facet arthropathy with edema and enhancement. 3 mm of degenerative anterolisthesis. Shallow protrusion of the disc, slightly more prominent towards the left. Multifactorial spinal stenosis which could cause neural compression on either or both sides. The appearance could worsen with standing or flexion.     Electronically Signed   By: Paulina FusiMark  Shogry M.D.   On: 06/28/2020 20:16     PMFS History: Patient Active Problem List   Diagnosis Date Noted   Spinal stenosis of lumbar region 05/30/2021   S/P cervical spinal fusion 12/18/2020   Cervical stenosis of spine 12/04/2020   Chronic radicular cervical pain 05/01/2020   Foraminal stenosis of cervical region (right) 05/01/2020   Chronic pain syndrome 05/01/2020   Cervical myelopathy with cervical radiculopathy (HCC) 05/01/2020   History of lumbar fusion 05/01/2020   HTN (hypertension)    Diabetes mellitus without complication (HCC)    Chronic back pain    Coronary artery disease    CAD (coronary artery disease) 03/22/2019   Hypertension 02/23/2019   Hyperlipidemia 02/23/2019   Chest pain 02/22/2019   Past Medical History:  Diagnosis Date   Anginal pain (HCC)    Anxiety    Chronic back pain    Coronary  artery disease    Diabetes mellitus without complication (HCC)    type 2   Dyspnea    Headache    HTN (hypertension)    Neuromuscular disorder (HCC)     Family History  Problem Relation Age of Onset   Heart disease Maternal Uncle     Past Surgical History:  Procedure Laterality Date   ANTERIOR CERVICAL DECOMP/DISCECTOMY FUSION N/A 12/04/2020   Procedure: ANTERIOR CERVICAL DISCECTOMY FUSION C3-4, C4-5, ALLOGRAFT, PLATE;  Surgeon: Eldred MangesYates, Edmond Ginsberg C, MD;  Location: [facility] OR;  Service: Orthopedics;  Laterality: N/A;  needs RNFA   BACK SURGERY     1992 and 2003(dr kritzer)   CARDIAC CATHETERIZATION     SHOULDER SURGERY Right    2017   TONSILLECTOMY  removed as a child   Social History   Occupational History   Not on file  Tobacco Use   Smoking status: Never   Smokeless tobacco: Never  Vaping Use   Vaping Use: Never used  Substance and Sexual Activity   Alcohol use: Yes    Comment: 1-2 glasses a day   Drug use: Yes    Types: Marijuana    Comment: 3 times a day   Sexual activity: Not on file

## 2021-06-01 ENCOUNTER — Telehealth: Payer: Self-pay | Admitting: Primary Care

## 2021-06-01 NOTE — Telephone Encounter (Signed)
I called the Pt to inform him that I will be cancelling his Financial appt since he has insurance and these program that he try to apply is for person that has no insurance at all, LVM

## 2021-06-04 ENCOUNTER — Ambulatory Visit: Payer: Medicare Other

## 2021-07-27 ENCOUNTER — Ambulatory Visit: Payer: Self-pay

## 2021-07-27 ENCOUNTER — Encounter: Payer: Self-pay | Admitting: Specialist

## 2021-07-27 ENCOUNTER — Other Ambulatory Visit: Payer: Self-pay

## 2021-07-27 ENCOUNTER — Ambulatory Visit (INDEPENDENT_AMBULATORY_CARE_PROVIDER_SITE_OTHER): Payer: Medicare Other | Admitting: Specialist

## 2021-07-27 VITALS — BP 138/87 | HR 87 | Ht 66.0 in | Wt 207.0 lb

## 2021-07-27 DIAGNOSIS — M4316 Spondylolisthesis, lumbar region: Secondary | ICD-10-CM | POA: Diagnosis not present

## 2021-07-27 DIAGNOSIS — G959 Disease of spinal cord, unspecified: Secondary | ICD-10-CM

## 2021-07-27 DIAGNOSIS — Z981 Arthrodesis status: Secondary | ICD-10-CM | POA: Diagnosis not present

## 2021-07-27 DIAGNOSIS — M48062 Spinal stenosis, lumbar region with neurogenic claudication: Secondary | ICD-10-CM

## 2021-07-27 DIAGNOSIS — M5412 Radiculopathy, cervical region: Secondary | ICD-10-CM | POA: Diagnosis not present

## 2021-07-27 MED ORDER — HYDROCODONE-ACETAMINOPHEN 5-325 MG PO TABS: 1.0000 | ORAL_TABLET | Freq: Four times a day (QID) | ORAL | 0 refills | Status: DC | PRN

## 2021-07-27 NOTE — Patient Instructions (Signed)
Plan:  ?Myelogram and post myelogram CT scan of the cervical and lumbar spine to assess for nerve compression and document degree of instability at L4-5 and assess site of recent cervical spine surgery healing and for any  ?Persistent cord compression.  ?Avoid bending, stooping and avoid lifting weights greater than 10 lbs. ?Avoid prolong standing and walking. ?After imaging and confirming the above findings we will likely recommend.  ?Surgery scheduling secretary Tivis Ringer, will call you in the next week to schedule for surgery.  ?Surgery recommended is a two level lumbar fusion L4-5 above previous L5-S1 fusion this would be done with rods, screws and cages with local bone graft and allograft (donor bone graft). ?Take hydrocodone for for pain. ?Risk of surgery includes risk of infection 1 in 200 patients, bleeding 1/2% chance you would need a transfusion.   Risk to the nerves is one in 10,000. ?You will need to use a brace for 3 months and wean from the brace on the 4th month. ?Expect improved walking and standing tolerance. Expect relief of leg pain but numbness ?may persist depending on the length and degree of pressure that has been present. ?  ?

## 2021-07-27 NOTE — Progress Notes (Signed)
Office Visit Note   Patient: Scott Tucker           Date of Birth: 1966-04-30           MRN: [id number] Visit Date: 07/27/2021              Requested by: Grayce Sessions, NP [address] [place],  [place] [address] PCP: Grayce Sessions, NP   Assessment & Plan: Visit Diagnoses:  1. Spinal stenosis of lumbar region with neurogenic claudication   2. Spondylolisthesis, lumbar region   3. Status post cervical spinal fusion   4. Cervical myelopathy with cervical radiculopathy (HCC)   5. S/P cervical spinal fusion     Plan:  Myelogram and post myelogram CT scan of the cervical and lumbar spine to assess for nerve compression and document degree of instability at L4-5 and assess site of recent cervical spine surgery healing and for any  Persistent cord compression.  Avoid bending, stooping and avoid lifting weights greater than 10 lbs. Avoid prolong standing and walking.  Surgery scheduling secretary Tivis Ringer, will call you in the next week to schedule for surgery.  Surgery recommended is a two level lumbar fusion L4-5 above previous L5-S1 fusion this would be done with rods, screws and cages with local bone graft and allograft (donor bone graft). Take hydrocodone for for pain. Risk of surgery includes risk of infection 1 in 200 patients, bleeding 1/2% chance you would need a transfusion.   Risk to the nerves is one in 10,000. You will need to use a brace for 3 months and wean from the brace on the 4th month. Expect improved walking and standing tolerance. Expect relief of leg pain but numbness may persist depending on the length and degree of pressure that has been present.    Follow-Up Instructions: No follow-ups on file.   Orders:  Orders Placed This Encounter  Procedures   XR Lumb Spine Flex&Ext Only   No orders of the defined types were placed in this encounter.     Procedures: No procedures performed   Clinical Data: No additional  findings.   Subjective: Chief Complaint  Patient presents with   Lower Back - Pain    56 year old male with history of back pain and radiation into both thighs posteriorly with burning like sensation. He has difficulty with bending, stoop, twisting and lifting with pain. Any sitting and with rolling over in bed is painful. He has AM stiffness in his back. He stopped working at the end of 2019 with back, heart issues and diabetes. He is able to walk and probably can walk one mile though he is questioning. Leans on the cart with grocery shopping. Had surgery for a bulging disc about 10 years ago then had fusion done after 8-9 years post Laminectomy. First was by Dr. Simonne Come, then the second by Dr. Aliene Beams. And then saw Dr. Ophelia Charter Last year with balance and coordination difficulties and falls. He was dropping items with his hands and losing feeling into his hands, underwent ACDF. No bowel or bladder difficulty, but notices urgency with hearing water run, can move fast enough sometimes.   Review of Systems  Constitutional: Negative.   HENT:  Positive for congestion.   Eyes: Negative.   Respiratory:  Positive for shortness of breath.   Cardiovascular: Negative.   Gastrointestinal: Negative.   Endocrine: Negative.   Genitourinary: Negative.   Musculoskeletal: Negative.   Skin: Negative.   Allergic/Immunologic: Negative.  Neurological: Negative.   Hematological: Negative.   Psychiatric/Behavioral: Negative.      Objective: Vital Signs: BP 138/87 (BP Location: Left Arm, Patient Position: Sitting)   Pulse 87   Ht 5\' 6"  (1.676 m)   Wt 207 lb (93.9 kg)   BMI 33.41 kg/m   Physical Exam Musculoskeletal:     Lumbar back: Negative right straight leg raise test and negative left straight leg raise test.    Back Exam   Tenderness  The patient is experiencing tenderness in the lumbar.  Range of Motion  Extension:  abnormal  Flexion:  abnormal  Lateral bend right:  abnormal   Lateral bend left:  abnormal  Rotation right:  abnormal  Rotation left:  abnormal   Tests  Straight leg raise right: negative Straight leg raise left: negative  Other  Toe walk: normal Heel walk: normal Sensation: normal Gait: abnormal  Erythema: no back redness Scars: absent     Specialty Comments:  No specialty comments available.  Imaging: No results found.   PMFS History: Patient Active Problem List   Diagnosis Date Noted   Spinal stenosis of lumbar region 05/30/2021   S/P cervical spinal fusion 12/18/2020   Chronic radicular cervical pain 05/01/2020   Foraminal stenosis of cervical region (right) 05/01/2020   Chronic pain syndrome 05/01/2020   History of lumbar fusion 05/01/2020   HTN (hypertension)    Diabetes mellitus without complication (HCC)    Chronic back pain    Coronary artery disease    CAD (coronary artery disease) 03/22/2019   Hypertension 02/23/2019   Hyperlipidemia 02/23/2019   Chest pain 02/22/2019   Past Medical History:  Diagnosis Date   Anginal pain (HCC)    Anxiety    Chronic back pain    Coronary artery disease    Diabetes mellitus without complication (HCC)    type 2   Dyspnea    Headache    HTN (hypertension)    Neuromuscular disorder (HCC)     Family History  Problem Relation Age of Onset   Heart disease Maternal Uncle     Past Surgical History:  Procedure Laterality Date   ANTERIOR CERVICAL DECOMP/DISCECTOMY FUSION N/A 12/04/2020   Procedure: ANTERIOR CERVICAL DISCECTOMY FUSION C3-4, C4-5, ALLOGRAFT, PLATE;  Surgeon: 12/06/2020, MD;  Location: [facility] OR;  Service: Orthopedics;  Laterality: N/A;  needs RNFA   BACK SURGERY     1992 and 2003(dr kritzer)   CARDIAC CATHETERIZATION     SHOULDER SURGERY Right    2017   TONSILLECTOMY     removed as a child   Social History   Occupational History   Not on file  Tobacco Use   Smoking status: Never   Smokeless tobacco: Never  Vaping Use   Vaping Use: Never used   Substance and Sexual Activity   Alcohol use: Yes    Comment: 1-2 glasses a day   Drug use: Yes    Types: Marijuana    Comment: 3 times a day   Sexual activity: Not on file

## 2021-08-02 ENCOUNTER — Encounter: Payer: Self-pay | Admitting: Specialist

## 2021-08-03 ENCOUNTER — Other Ambulatory Visit: Payer: Medicaid Other

## 2021-08-29 ENCOUNTER — Ambulatory Visit
Admission: RE | Admit: 2021-08-29 | Discharge: 2021-08-29 | Disposition: A | Discharge status: Home / Self Care | Payer: Medicare Other | Source: Ambulatory Visit | Attending: Specialist | Admitting: Specialist

## 2021-08-29 ENCOUNTER — Ambulatory Visit
Admission: RE | Admit: 2021-08-29 | Discharge: 2021-08-29 | Disposition: A | Discharge status: Home / Self Care | Payer: Medicaid Other | Source: Ambulatory Visit | Attending: Specialist | Admitting: Specialist

## 2021-08-29 ENCOUNTER — Other Ambulatory Visit: Payer: Medicaid Other

## 2021-08-29 DIAGNOSIS — M48062 Spinal stenosis, lumbar region with neurogenic claudication: Secondary | ICD-10-CM

## 2021-08-29 DIAGNOSIS — M4316 Spondylolisthesis, lumbar region: Secondary | ICD-10-CM

## 2021-08-29 DIAGNOSIS — Z981 Arthrodesis status: Secondary | ICD-10-CM

## 2021-08-29 DIAGNOSIS — G959 Disease of spinal cord, unspecified: Secondary | ICD-10-CM

## 2021-08-29 MED ORDER — IOPAMIDOL (ISOVUE-M 300) INJECTION 61%
10.0000 mL | Freq: Once | INTRAMUSCULAR | Status: AC
  Administered 2021-08-29: 10 mL via INTRATHECAL

## 2021-08-29 MED ORDER — ONDANSETRON HCL 4 MG/2ML IJ SOLN
4.0000 mg | Freq: Once | INTRAMUSCULAR | Status: AC | PRN
  Administered 2021-08-29: 4 mg via INTRAMUSCULAR

## 2021-08-29 MED ORDER — MEPERIDINE HCL 50 MG/ML IJ SOLN
50.0000 mg | Freq: Once | INTRAMUSCULAR | Status: AC | PRN
  Administered 2021-08-29: 50 mg via INTRAMUSCULAR

## 2021-08-29 MED ORDER — DIAZEPAM 5 MG PO TABS
10.0000 mg | ORAL_TABLET | Freq: Once | ORAL | Status: AC
  Administered 2021-08-29: 10 mg via ORAL

## 2021-08-29 NOTE — Discharge Instructions (Signed)

## 2021-08-29 NOTE — Discharge Instr - Other Orders (Signed)
Pt. Having 10/10 pain after myelogram RN assessed medication given. See MAR. ?

## 2021-09-06 ENCOUNTER — Encounter: Payer: Self-pay | Admitting: Physician Assistant

## 2021-09-06 ENCOUNTER — Ambulatory Visit (INDEPENDENT_AMBULATORY_CARE_PROVIDER_SITE_OTHER): Payer: Medicare Other | Admitting: Physician Assistant

## 2021-09-06 ENCOUNTER — Other Ambulatory Visit: Payer: Self-pay

## 2021-09-06 VITALS — BP 142/88 | HR 93 | Temp 98.8°F | Resp 18 | Ht 66.0 in | Wt 197.0 lb

## 2021-09-06 DIAGNOSIS — L03213 Periorbital cellulitis: Secondary | ICD-10-CM

## 2021-09-06 DIAGNOSIS — E119 Type 2 diabetes mellitus without complications: Secondary | ICD-10-CM

## 2021-09-06 MED ORDER — CETIRIZINE HCL 10 MG PO TABS
10.0000 mg | ORAL_TABLET | Freq: Every day | ORAL | 11 refills | Status: DC
Start: 2021-09-06 — End: 2021-12-25
  Filled 2021-09-06: qty 30, 30d supply, fill #0

## 2021-09-06 MED ORDER — AMOXICILLIN-POT CLAVULANATE 875-125 MG PO TABS
1.0000 | ORAL_TABLET | Freq: Two times a day (BID) | ORAL | 0 refills | Status: DC
Start: 2021-09-06 — End: 2021-10-25
  Filled 2021-09-06: qty 20, 10d supply, fill #0

## 2021-09-06 NOTE — Progress Notes (Signed)
? ?Established Patient Office Visit ? ?Subjective   ?Patient ID: Scott Tucker, male    DOB: 04-13-1966  Age: 56 y.o. MRN: [id number] ? ?Chief Complaint  ?Patient presents with  ? Eye Problem  ?  R Eye Stye  ? ? ?States that over the past month, he feels like he has had a cloudy film over his left eye.  States over the last 3 days, he started having swelling of his upper eyelid, has made it difficult for him to open his eye.  States that he has been having pain "behind his eye".  Denies discharge, matting, pain with movement of eye, loss of vision. ? ?States that he has tried warm compresses without relief.  Denies injury or trauma to the eye.   ? ? ?Past Medical History:  ?Diagnosis Date  ? Anginal pain (HCC)   ? Anxiety   ? Chronic back pain   ? Coronary artery disease   ? Diabetes mellitus without complication (HCC)   ? type 2  ? Dyspnea   ? Headache   ? HTN (hypertension)   ? Neuromuscular disorder (HCC)   ? ?Social History  ? ?Socioeconomic History  ? Marital status: Single  ?  Spouse name: Not on file  ? Number of children: Not on file  ? Years of education: Not on file  ? Highest education level: Not on file  ?Occupational History  ? Not on file  ?Tobacco Use  ? Smoking status: Never  ? Smokeless tobacco: Never  ?Vaping Use  ? Vaping Use: Never used  ?Substance and Sexual Activity  ? Alcohol use: Yes  ?  Comment: 1-2 glasses a day  ? Drug use: Yes  ?  Types: Marijuana  ?  Comment: 3 times a day  ? Sexual activity: Not Currently  ?Other Topics Concern  ? Not on file  ?Social History Narrative  ? Not on file  ? ?Social Determinants of Health  ? ?Financial Resource Strain: Not on file  ?Food Insecurity: Not on file  ?Transportation Needs: Not on file  ?Physical Activity: Not on file  ?Stress: Not on file  ?Social Connections: Not on file  ?Intimate Partner Violence: Not on file  ? ?Family History  ?Problem Relation Age of Onset  ? Heart disease Maternal Uncle   ? ?Allergies  ?Allergen Reactions  ? Latex Rash   ? ?  ? ?Review of Systems  ?Constitutional:  Negative for chills and fever.  ?HENT:  Negative for congestion, ear discharge, ear pain, sinus pain and sore throat.   ?Eyes:  Positive for pain and redness. Negative for blurred vision, double vision, photophobia and discharge.  ?Respiratory:  Negative for shortness of breath.   ?Cardiovascular:  Negative for chest pain.  ?Gastrointestinal: Negative.   ?Genitourinary: Negative.   ?Musculoskeletal: Negative.   ?Skin: Negative.   ?Neurological:  Negative for sensory change, speech change and headaches.  ?Endo/Heme/Allergies: Negative.   ?Psychiatric/Behavioral: Negative.    ? ?  ?Objective:  ?  ? ?BP (!) 142/88 (BP Location: Left Arm, Patient Position: Sitting, Cuff Size: Normal)   Pulse 93   Temp 98.8 ?F (37.1 ?C) (Oral)   Resp 18   Ht 5\' 6"  (1.676 m)   Wt 197 lb (89.4 kg)   SpO2 96%   BMI 31.80 kg/m?  ? ? ?Physical Exam ?Vitals and nursing note reviewed.  ?Constitutional:   ?   General: He is not in acute distress. ?   Appearance: Normal appearance. He  is not ill-appearing.  ?HENT:  ?   Right Ear: External ear normal.  ?   Left Ear: External ear normal.  ?   Nose: Nose normal.  ?   Mouth/Throat:  ?   Mouth: Mucous membranes are moist.  ?   Pharynx: Oropharynx is clear.  ?Eyes:  ?   General:     ?   Right eye: No foreign body, discharge or hordeolum.  ?   Extraocular Movements: Extraocular movements intact.  ?   Right eye: Normal extraocular motion and no nystagmus.  ?   Conjunctiva/sclera:  ?   Right eye: Right conjunctiva is injected.  ?   Comments: Right upper and bottom lid edema  ?Cardiovascular:  ?   Rate and Rhythm: Normal rate and regular rhythm.  ?   Pulses: Normal pulses.  ?   Heart sounds: Normal heart sounds.  ?Pulmonary:  ?   Effort: Pulmonary effort is normal.  ?   Breath sounds: Normal breath sounds.  ?Musculoskeletal:     ?   General: Normal range of motion.  ?   Cervical back: Normal range of motion and neck supple.  ?Skin: ?   General: Skin is  warm and dry.  ?Neurological:  ?   General: No focal deficit present.  ?   Mental Status: He is alert and oriented to person, place, and time.  ?Psychiatric:     ?   Mood and Affect: Mood normal.     ?   Behavior: Behavior normal.     ?   Thought Content: Thought content normal.     ?   Judgment: Judgment normal.  ? ? ? ?  ?Assessment & Plan:  ? ?Problem List Items Addressed This Visit   ? ?  ? Endocrine  ? Diabetes mellitus without complication (HCC)  ? ?Other Visit Diagnoses   ? ? Preseptal cellulitis of right eye    -  Primary  ? Relevant Medications  ? amoxicillin-clavulanate (AUGMENTIN) 875-125 MG tablet  ? cetirizine (ZYRTEC) 10 MG tablet  ? ?  ? ?1. Preseptal cellulitis of right eye ?Trial Augmentin, Zyrtec.  Patient education given on supportive care.  Red flags given for prompt reevaluation.  Close follow-up with this provider, will follow-up on Tuesday, May 2. ? ?Patient strongly encouraged to schedule optometry as soon as able. ?- amoxicillin-clavulanate (AUGMENTIN) 875-125 MG tablet; Take 1 tablet by mouth 2 (two) times daily.  Dispense: 20 tablet; Refill: 0 ?- cetirizine (ZYRTEC) 10 MG tablet; Take 1 tablet (10 mg total) by mouth daily.  Dispense: 30 tablet; Refill: 11 ? ?2. Diabetes mellitus without complication (HCC) ? ? ? ?I have reviewed the patient's medical history (PMH, PSH, Social History, Family History, Medications, and allergies) , and have been updated if relevant. I spent 30 minutes reviewing chart and  face to face time with patient. ? ? ? ?Return in about 5 days (around 09/11/2021) for with mobile team.  ? ? ?Ely Spragg S Mayers, PA-C ? ?

## 2021-09-06 NOTE — Patient Instructions (Signed)
You are going to take Augmentin twice a day for the next 10 days.  I do encourage you to also start Zyrtec on a daily basis. ? ?I strongly encourage you to schedule an optical exam as soon as you are able. ? ?You will return to follow-up with the mobile team on Tuesday morning. ? ?Roney Jaffe, PA-C ?Physician Assistant ?[facility] ?[email or web address] ? ? ?Preseptal Cellulitis, Adult ?Preseptal cellulitis is an infection of the eyelid and the tissues around the eye (periorbital area). The infection causes painful swelling and redness. This condition may also be called periorbital cellulitis. ?In most cases, the condition can be treated with antibiotic medicine at home. It is important to treat preseptal cellulitis right away so that it does not get worse. If it gets worse, it can spread to the eye socket and eye muscles (orbital cellulitis). Orbital cellulitis is a medical emergency. ?What are the causes? ?Preseptal cellulitis is most commonly caused by bacteria. In rare cases, it can be caused by a virus or fungus. The germs that cause preseptal cellulitis may come from: ?A sinus infection that spreads near the eyes. ?An injury near the eye, such as a scratch, puncture wound, animal bite, or insect bite. ?A skin rash, such as eczema or poison ivy, that becomes infected. ?An infected pimple on the eyelid (stye). ?Infection after eyelid surgery or injury. ?What increases the risk? ?You are more likely to develop this condition if: ?You have a weakened disease-fighting system (immune system). ?You have a medical condition that raises your risk for sinus infections, such as nasal polyps. ?What are the signs or symptoms? ?Symptoms of this condition include: ?Eyelids that are red and swollen and feel unusually hot. ?Fever. ?Difficulty opening the eye. ?Headache. ?Pain in the face. ?Symptoms of this condition usually develop suddenly. ?How is this diagnosed? ?This  condition may be diagnosed based on your symptoms, your medical history, and an eye exam. You may also have tests, such as: ?Blood tests. ?Tests (cultures) to find out which specific bacteria are causing the infection. You may have a culture of any open wound or drainage. ?CT scan. ?MRI. This is less common. ?How is this treated? ?This condition is treated with antibiotic medicines. These may be given by mouth (orally), through an IV, or as an injection. In rare cases, you may need surgery to drain an infected area. ?Follow these instructions at home: ?Medicines ?Take your antibiotic medicine as told by your health care provider. Do not stop taking the antibiotic even if you start to feel better ?Take over-the-counter and prescription medicines only as told by your health care provider. ?Eye Care ?Do not use eye drops without first getting approval from your health care provider. ?Do not touch or rub your eye. If you wear contact lenses, do not wear them until your health care provider approves. ?Keep the eye area clean and dry. ?Wash the eye area with a clean washcloth, warm water, and baby shampoo or mild soap. ?To help relieve discomfort, place a clean washcloth that is wet with warm water over your eye. Leave the washcloth on for a few minutes, then remove it. ?General instructions ?Wash your hands with soap and water often for at least 20 seconds. If soap and water are not available, use hand sanitizer. ?Do not use any products that contain nicotine or tobacco, such as cigarettes, e-cigarettes, and chewing tobacco. If you need help quitting, ask your health care provider. ?Drink enough fluid to keep your urine  pale yellow. ?Do not drive or operate machinery until your health care provider says that it is safe. Ask your health care provider if it is safe for you to drive. ?Stay up to date on your vaccinations. ?Keep all follow-up visits. This includes any visits with an eye specialist (ophthalmologist) or  dentist. This is important. ?Get help right away if: ?You have new symptoms. ?Your symptoms get worse or do not get better with treatment. ?You have a fever. ?Your vision becomes blurry or gets worse in any way. ?Your eye looks like it is sticking out or bulging out (proptosis). ?You develop double vision. ?You have trouble moving your eyes or pain when moving your eyes ?You have a severe headache. ?You have neck stiffness or severe neck pain. ?These symptoms may represent a serious problem that is an emergency. Do not wait to see if the symptoms will go away. Get medical help right away. Call your local emergency services (911 in the [place] Do not drive yourself to the hospital. ?Summary ?Preseptal cellulitis is an infection of the eyelid and the tissues around the eye. ?Symptoms of preseptal cellulitis usually develop suddenly and include red and swollen eyelids, fever, difficulty opening the eye, headache, and facial pain. ?This condition is treated with antibiotic medicines. Do not stop taking the antibiotic even if you start to feel better. ?Preseptal cellulitis can develop into orbital cellulitis, which is a medical emergency. If your condition does not improve or worsens, visit your heath care provider right away. ?This information is not intended to replace advice given to you by your health care provider. Make sure you discuss any questions you have with your health care provider. ?Document Revised: 09/01/2019 Document Reviewed: 09/01/2019 ?Elsevier Patient Education ? 2023 [employer] Inc[employer] ? ?

## 2021-09-06 NOTE — Progress Notes (Signed)
Patient has not eaten or taken medication today. ?Patient reports pain behind the eye for 3 days with swelling occurring yesterday. ? ?

## 2021-09-07 ENCOUNTER — Other Ambulatory Visit: Payer: Self-pay

## 2021-09-10 ENCOUNTER — Encounter: Payer: Self-pay | Admitting: Specialist

## 2021-09-11 ENCOUNTER — Encounter: Payer: Self-pay | Admitting: Physician Assistant

## 2021-09-11 ENCOUNTER — Ambulatory Visit (INDEPENDENT_AMBULATORY_CARE_PROVIDER_SITE_OTHER): Payer: Medicare Other | Admitting: Physician Assistant

## 2021-09-11 VITALS — BP 136/87 | HR 83 | Temp 98.7°F | Resp 18 | Ht 67.5 in | Wt 197.0 lb

## 2021-09-11 DIAGNOSIS — L03213 Periorbital cellulitis: Secondary | ICD-10-CM

## 2021-09-11 NOTE — Progress Notes (Signed)
? ?Established Patient Office Visit ? ?Subjective   ?Patient ID: Scott Tucker, male    DOB: 1966-03-25  Age: 56 y.o. MRN: [id number] ? ?Chief Complaint  ?Patient presents with  ? Follow-up  ?  Cellulitis of R Eye  ? ? ? ?States that he has been taking the antibiotics as directed.  States that the swelling and tenderness has improved, states that he still has a small amount of pain behind his eye, otherwise much improved. ? ?States that he is waiting to follow-up with orthopedics due to his upcoming potential surgery. ? ?Otherwise no new concerns today ? ? ?Past Medical History:  ?Diagnosis Date  ? Anginal pain (HCC)   ? Anxiety   ? Chronic back pain   ? Coronary artery disease   ? Diabetes mellitus without complication (HCC)   ? type 2  ? Dyspnea   ? Headache   ? HTN (hypertension)   ? Neuromuscular disorder (HCC)   ? ?Social History  ? ?Socioeconomic History  ? Marital status: Single  ?  Spouse name: Not on file  ? Number of children: Not on file  ? Years of education: Not on file  ? Highest education level: Not on file  ?Occupational History  ? Not on file  ?Tobacco Use  ? Smoking status: Never  ? Smokeless tobacco: Never  ?Vaping Use  ? Vaping Use: Never used  ?Substance and Sexual Activity  ? Alcohol use: Yes  ?  Comment: 1-2 glasses a day  ? Drug use: Yes  ?  Types: Marijuana  ?  Comment: 3 times a day  ? Sexual activity: Not Currently  ?Other Topics Concern  ? Not on file  ?Social History Narrative  ? Not on file  ? ?Social Determinants of Health  ? ?Financial Resource Strain: Not on file  ?Food Insecurity: Not on file  ?Transportation Needs: Not on file  ?Physical Activity: Not on file  ?Stress: Not on file  ?Social Connections: Not on file  ?Intimate Partner Violence: Not on file  ? ?Family History  ?Problem Relation Age of Onset  ? Heart disease Maternal Uncle   ? ?Allergies  ?Allergen Reactions  ? Latex Rash  ? ?  ? ?Review of Systems  ?Constitutional: Negative.   ?HENT: Negative.    ?Eyes:  Negative for  blurred vision, photophobia, pain, discharge and redness.  ?Respiratory:  Negative for shortness of breath.   ?Cardiovascular:  Negative for chest pain.  ?Gastrointestinal: Negative.   ?Genitourinary: Negative.   ?Musculoskeletal:  Positive for back pain.  ?Skin: Negative.   ?Neurological: Negative.   ?Endo/Heme/Allergies: Negative.   ?Psychiatric/Behavioral: Negative.    ? ?  ?Objective:  ?  ? ?BP 136/87 (BP Location: Right Arm, Patient Position: Sitting, Cuff Size: Normal)   Pulse 83   Temp 98.7 ?F (37.1 ?C) (Oral)   Resp 18   Ht 5' 7.5" (1.715 m)   Wt 197 lb (89.4 kg)   SpO2 94%   BMI 30.40 kg/m?  ?Physical Exam ?Vitals and nursing note reviewed.  ?Constitutional:   ?   Appearance: Normal appearance.  ?   Comments: Using cane   ?HENT:  ?   Head: Normocephalic and atraumatic.  ?   Right Ear: External ear normal.  ?   Left Ear: External ear normal.  ?   Nose: Nose normal.  ?Eyes:  ?   Extraocular Movements: Extraocular movements intact.  ?   Conjunctiva/sclera: Conjunctivae normal.  ?   Pupils: Pupils  are equal, round, and reactive to light.  ?   Comments: Slight swelling noted right upper lid, much improved  ?Slight swelling noted under right eye, much improved   ?Cardiovascular:  ?   Rate and Rhythm: Normal rate and regular rhythm.  ?   Pulses: Normal pulses.  ?   Heart sounds: Normal heart sounds.  ?Pulmonary:  ?   Effort: Pulmonary effort is normal.  ?   Breath sounds: Normal breath sounds.  ?Musculoskeletal:     ?   General: Normal range of motion.  ?   Cervical back: Normal range of motion and neck supple.  ?Skin: ?   General: Skin is warm and dry.  ?Neurological:  ?   General: No focal deficit present.  ?   Mental Status: He is alert and oriented to person, place, and time.  ?Psychiatric:     ?   Mood and Affect: Mood normal.     ?   Behavior: Behavior normal.     ?   Thought Content: Thought content normal.     ?   Judgment: Judgment normal.  ? ? ?  ?Assessment & Plan:  ? ?Problem List Items  Addressed This Visit   ?None ?Visit Diagnoses   ? ? Preseptal cellulitis of right eye    -  Primary  ? ?  ?1. Preseptal cellulitis of right eye ?Encourage patient to complete antibiotic regimen, mostly resolved, red flags given for prompt reevaluation ? ? ?I have reviewed the patient's medical history (PMH, PSH, Social History, Family History, Medications, and allergies) , and have been updated if relevant. I spent 10 minutes reviewing chart and  face to face time with patient. ? ? ?Return if symptoms worsen or fail to improve.  ? ? ?Evellyn Tuff S Mayers, PA-C ? ?

## 2021-09-11 NOTE — Patient Instructions (Signed)
?Continue taking the antibiotic as prescribed at our last office visit.  You should continue to notice improvement and resolution soon. ? ?I do encourage you to call orthopedics and asked to speak with ? ?Surgery scheduling secretary Tivis Ringer ? ? for follow-up of your back pain ? ?Please let us know if there is anything else we can do for you ? ?Roney Jaffe, PA-C ?Physician Assistant ?[facility] ?[email or web address] ? ? ?Health Maintenance, Male ?Adopting a healthy lifestyle and getting preventive care are important in promoting health and wellness. Ask your health care provider about: ?The right schedule for you to have regular tests and exams. ?Things you can do on your own to prevent diseases and keep yourself healthy. ?What should I know about diet, weight, and exercise? ?Eat a healthy diet ? ?Eat a diet that includes plenty of vegetables, fruits, low-fat dairy products, and lean protein. ?Do not eat a lot of foods that are high in solid fats, added sugars, or sodium. ?Maintain a healthy weight ?Body mass index (BMI) is a measurement that can be used to identify possible weight problems. It estimates body fat based on height and weight. Your health care provider can help determine your BMI and help you achieve or maintain a healthy weight. ?Get regular exercise ?Get regular exercise. This is one of the most important things you can do for your health. Most adults should: ?Exercise for at least 150 minutes each week. The exercise should increase your heart rate and make you sweat (moderate-intensity exercise). ?Do strengthening exercises at least twice a week. This is in addition to the moderate-intensity exercise. ?Spend less time sitting. Even light physical activity can be beneficial. ?Watch cholesterol and blood lipids ?Have your blood tested for lipids and cholesterol at 56 years of age, then have this test every 5 years. ?You may need to  have your cholesterol levels checked more often if: ?Your lipid or cholesterol levels are high. ?You are older than 56 years of age. ?You are at high risk for heart disease. ?What should I know about cancer screening? ?Many types of cancers can be detected early and may often be prevented. Depending on your health history and family history, you may need to have cancer screening at various ages. This may include screening for: ?Colorectal cancer. ?Prostate cancer. ?Skin cancer. ?Lung cancer. ?What should I know about heart disease, diabetes, and high blood pressure? ?Blood pressure and heart disease ?High blood pressure causes heart disease and increases the risk of stroke. This is more likely to develop in people who have high blood pressure readings or are overweight. ?Talk with your health care provider about your target blood pressure readings. ?Have your blood pressure checked: ?Every 3-5 years if you are 4-50 years of age. ?Every year if you are 79 years old or older. ?If you are between the ages of 38 and 33 and are a current or former smoker, ask your health care provider if you should have a one-time screening for abdominal aortic aneurysm (AAA). ?Diabetes ?Have regular diabetes screenings. This checks your fasting blood sugar level. Have the screening done: ?Once every three years after age 47 if you are at a normal weight and have a low risk for diabetes. ?More often and at a younger age if you are overweight or have a high risk for diabetes. ?What should I know about preventing infection? ?Hepatitis B ?If you have a higher risk for hepatitis B, you should be screened for this virus. Talk  with your health care provider to find out if you are at risk for hepatitis B infection. ?Hepatitis C ?Blood testing is recommended for: ?Everyone born from [birth year] through 1965. ?Anyone with known risk factors for hepatitis C. ?Sexually transmitted infections (STIs) ?You should be screened each year for STIs, including  gonorrhea and chlamydia, if: ?You are sexually active and are younger than 56 years of age. ?You are older than 56 years of age and your health care provider tells you that you are at risk for this type of infection. ?Your sexual activity has changed since you were last screened, and you are at increased risk for chlamydia or gonorrhea. Ask your health care provider if you are at risk. ?Ask your health care provider about whether you are at high risk for HIV. Your health care provider may recommend a prescription medicine to help prevent HIV infection. If you choose to take medicine to prevent HIV, you should first get tested for HIV. You should then be tested every 3 months for as long as you are taking the medicine. ?Follow these instructions at home: ?Alcohol use ?Do not drink alcohol if your health care provider tells you not to drink. ?If you drink alcohol: ?Limit how much you have to 0-2 drinks a day. ?Know how much alcohol is in your drink. In the [place] one drink equals one 12 oz bottle of beer (355 mL), one 5 oz glass of wine (148 mL), or one 1? oz glass of hard liquor (44 mL). ?Lifestyle ?Do not use any products that contain nicotine or tobacco. These products include cigarettes, chewing tobacco, and vaping devices, such as e-cigarettes. If you need help quitting, ask your health care provider. ?Do not use street drugs. ?Do not share needles. ?Ask your health care provider for help if you need support or information about quitting drugs. ?General instructions ?Schedule regular health, dental, and eye exams. ?Stay current with your vaccines. ?Tell your health care provider if: ?You often feel depressed. ?You have ever been abused or do not feel safe at home. ?Summary ?Adopting a healthy lifestyle and getting preventive care are important in promoting health and wellness. ?Follow your health care provider's instructions about healthy diet, exercising, and getting tested or screened for diseases. ?Follow your  health care provider's instructions on monitoring your cholesterol and blood pressure. ?This information is not intended to replace advice given to you by your health care provider. Make sure you discuss any questions you have with your health care provider. ?Document Revised: 09/18/2020 Document Reviewed: 09/18/2020 ?Elsevier Patient Education ? 2023 [employer]vier Inc. ? ?

## 2021-09-28 ENCOUNTER — Ambulatory Visit (INDEPENDENT_AMBULATORY_CARE_PROVIDER_SITE_OTHER): Payer: Medicare Other | Admitting: Specialist

## 2021-09-28 ENCOUNTER — Encounter: Payer: Self-pay | Admitting: Specialist

## 2021-09-28 VITALS — BP 137/91 | HR 67 | Ht 67.5 in | Wt 197.0 lb

## 2021-09-28 DIAGNOSIS — M4316 Spondylolisthesis, lumbar region: Secondary | ICD-10-CM | POA: Diagnosis not present

## 2021-09-28 DIAGNOSIS — M4802 Spinal stenosis, cervical region: Secondary | ICD-10-CM

## 2021-09-28 DIAGNOSIS — Z981 Arthrodesis status: Secondary | ICD-10-CM

## 2021-09-28 DIAGNOSIS — G959 Disease of spinal cord, unspecified: Secondary | ICD-10-CM

## 2021-09-28 DIAGNOSIS — M542 Cervicalgia: Secondary | ICD-10-CM

## 2021-09-28 DIAGNOSIS — M48062 Spinal stenosis, lumbar region with neurogenic claudication: Secondary | ICD-10-CM

## 2021-09-28 DIAGNOSIS — M5412 Radiculopathy, cervical region: Secondary | ICD-10-CM

## 2021-09-28 MED ORDER — HYDROCODONE-ACETAMINOPHEN 10-325 MG PO TABS: 0.5000 | ORAL_TABLET | Freq: Four times a day (QID) | ORAL | 0 refills | Status: DC | PRN

## 2021-09-28 NOTE — Patient Instructions (Signed)
Plan: Avoid overhead lifting and overhead use of the arms. Do not lift greater than 5 lbs. Adjust head rest in vehicle to prevent hyperextension if rear ended. Take extra precautions to avoid falling. Avoid bending, stooping and avoid lifting weights greater than 10 lbs. Avoid prolong standing and walking. Order for a new walker with wheels. Surgery scheduling secretary Tivis Ringer, will call you in the next week to schedule for surgery.  Surgery recommended is a two level lumbar decompressive laminectomy Left L3-4 and bilateral L4-5 with one level L4-5 fusion this would be done with rods, screws and cages with local bone graft and allograft (donor bone graft). Take hydrocodone for for pain. Risk of surgery includes risk of infection 1 in 200 patients, bleeding 1/2% chance you would need a transfusion.   Risk to the nerves is one in 10,000. You will need to use a brace for 3 months and wean from the brace on the 4th month. Expect improved walking and standing tolerance. Expect relief of leg pain but numbness may persist depending on the length and degree of pressure that has been present.

## 2021-09-28 NOTE — Progress Notes (Signed)
Office Visit Note   Patient: Scott Tucker           Date of Birth: 1966/05/05           MRN: [id number] Visit Date: 09/28/2021              Requested by: Grayce Sessions, NP [address] [place],  [place] [address] PCP: Grayce Sessions, NP   Assessment & Plan: Visit Diagnoses:  1. Spondylolisthesis, lumbar region   2. Spinal stenosis of cervical region   3. Status post cervical spinal fusion   4. Neck pain   5. Cervical myelopathy with cervical radiculopathy (HCC)   6. S/P cervical spinal fusion     Plan: Avoid overhead lifting and overhead use of the arms. Do not lift greater than 5 lbs. Adjust head rest in vehicle to prevent hyperextension if rear ended. Take extra precautions to avoid falling. Avoid bending, stooping and avoid lifting weights greater than 10 lbs. Avoid prolong standing and walking. Order for a new walker with wheels. Surgery scheduling secretary Tivis Ringer, will call you in the next week to schedule for surgery.  Surgery recommended is a two level lumbar decompressive laminectomy Left L3-4 and bilateral L4-5 with one level L4-5 fusion this would be done with rods, screws and cages with local bone graft and allograft (donor bone graft). Take hydrocodone for for pain. Risk of surgery includes risk of infection 1 in 200 patients, bleeding 1/2% chance you would need a transfusion.   Risk to the nerves is one in 10,000. You will need to use a brace for 3 months and wean from the brace on the 4th month. Expect improved walking and standing tolerance. Expect relief of leg pain but numbness may persist depending on the length and degree of pressure that has been present.    Follow-Up Instructions: No follow-ups on file.   Orders:  No orders of the defined types were placed in this encounter.  No orders of the defined types were placed in this encounter.     Procedures: No procedures performed   Clinical Data: No additional  findings.   Subjective: Chief Complaint  Patient presents with  . Neck - Follow-up    Myelogram Review  . Lower Back - Follow-up    Myelogram Review    HPI  Review of Systems  Constitutional: Negative.   HENT: Negative.    Eyes: Negative.   Respiratory: Negative.    Cardiovascular: Negative.   Gastrointestinal: Negative.   Endocrine: Negative.   Genitourinary: Negative.   Musculoskeletal: Negative.   Skin: Negative.   Allergic/Immunologic: Negative.   Neurological: Negative.   Hematological: Negative.   Psychiatric/Behavioral: Negative.      Objective: Vital Signs: BP (!) 137/91 (BP Location: Left Arm, Patient Position: Sitting)   Pulse 67   Ht 5' 7.5" (1.715 m)   Wt 197 lb (89.4 kg)   BMI 30.40 kg/m   Physical Exam Constitutional:      Appearance: He is well-developed.  HENT:     Head: Normocephalic and atraumatic.  Eyes:     Pupils: Pupils are equal, round, and reactive to light.  Pulmonary:     Effort: Pulmonary effort is normal.     Breath sounds: Normal breath sounds.  Abdominal:     General: Bowel sounds are normal.     Palpations: Abdomen is soft.  Musculoskeletal:     Cervical back: Normal range of motion and neck supple.     Lumbar  back: Negative right straight leg raise test and negative left straight leg raise test.  Skin:    General: Skin is warm and dry.  Neurological:     Mental Status: He is alert and oriented to person, place, and time.  Psychiatric:        Behavior: Behavior normal.        Thought Content: Thought content normal.        Judgment: Judgment normal.   Back Exam   Tenderness  The patient is experiencing tenderness in the lumbar and cervical.  Range of Motion  Extension:  abnormal  Flexion:  abnormal  Lateral bend right:  abnormal  Lateral bend left:  abnormal  Rotation right:  abnormal  Rotation left:  abnormal   Tests  Straight leg raise right: negative Straight leg raise left: negative    Specialty  Comments:  No specialty comments available.  Imaging: No results found.   PMFS History: Patient Active Problem List   Diagnosis Date Noted  . Spinal stenosis of lumbar region 05/30/2021  . S/P cervical spinal fusion 12/18/2020  . Chronic radicular cervical pain 05/01/2020  . Foraminal stenosis of cervical region (right) 05/01/2020  . Chronic pain syndrome 05/01/2020  . History of lumbar fusion 05/01/2020  . HTN (hypertension)   . Diabetes mellitus without complication (HCC)   . Chronic back pain   . Coronary artery disease   . CAD (coronary artery disease) 03/22/2019  . Hypertension 02/23/2019  . Hyperlipidemia 02/23/2019  . Chest pain 02/22/2019   Past Medical History:  Diagnosis Date  . Anginal pain (HCC)   . Anxiety   . Chronic back pain   . Coronary artery disease   . Diabetes mellitus without complication (HCC)    type 2  . Dyspnea   . Headache   . HTN (hypertension)   . Neuromuscular disorder (HCC)     Family History  Problem Relation Age of Onset  . Heart disease Maternal Uncle     Past Surgical History:  Procedure Laterality Date  . ANTERIOR CERVICAL DECOMP/DISCECTOMY FUSION N/A 12/04/2020   Procedure: ANTERIOR CERVICAL DISCECTOMY FUSION C3-4, C4-5, ALLOGRAFT, PLATE;  Surgeon: Eldred Manges, MD;  Location: [facility] OR;  Service: Orthopedics;  Laterality: N/A;  needs RNFA  . BACK SURGERY     1992 and 2003(dr kritzer)  . CARDIAC CATHETERIZATION    . SHOULDER SURGERY Right    2017  . TONSILLECTOMY     removed as a child   Social History   Occupational History  . Not on file  Tobacco Use  . Smoking status: Never  . Smokeless tobacco: Never  Vaping Use  . Vaping Use: Never used  Substance and Sexual Activity  . Alcohol use: Yes    Comment: 1-2 glasses a day  . Drug use: Yes    Types: Marijuana    Comment: 3 times a day  . Sexual activity: Not Currently

## 2021-10-08 ENCOUNTER — Encounter: Payer: Self-pay | Admitting: Specialist

## 2021-10-09 ENCOUNTER — Other Ambulatory Visit: Payer: Self-pay

## 2021-10-10 ENCOUNTER — Encounter (INDEPENDENT_AMBULATORY_CARE_PROVIDER_SITE_OTHER): Payer: Self-pay | Admitting: Primary Care

## 2021-10-19 ENCOUNTER — Telehealth: Payer: Self-pay | Admitting: *Deleted

## 2021-10-19 NOTE — Telephone Encounter (Signed)
   Pre-operative Risk Assessment    Patient Name: Scott Tucker  DOB: 1965-07-08 MRN: [id number]      Request for Surgical Clearance    Procedure:   LUMBAR FUSION  Date of Surgery:  Clearance TBD                                 Surgeon:  DR. Basil Dess Surgeon's Group or Practice Name:  [facility] Phone number:  [phone] Fax number:  [phone] ATTN: Venida Jarvis   Type of Clearance Requested:   - Medical ; ASA, THOUGH THIS IS NOT BEING REQUESTED AS NEEDING TO BE HELD   Type of Anesthesia:  General    Additional requests/questions:    Jiles Prows   10/19/2021, 3:28 PM

## 2021-10-22 ENCOUNTER — Telehealth: Payer: Self-pay | Admitting: *Deleted

## 2021-10-22 NOTE — Telephone Encounter (Signed)
[place], MD  Chart reviewed as part of pre-operative protocol coverage. Because of Scott Tucker past medical history and time since last visit, he/she will require a virtual visit/telephone call in order to better assess preoperative cardiovascular risk.  Pre-op covering staff: - Please contact patient, obtain consent, and schedule appointment   If applicable, this message will also be routed to pharmacy pool and/or primary cardiologist for input on holding anticoagulant/antiplatelet agent as requested below so that this information is available at time of patient's appointment.   Emmaline Life, NP-C    10/22/2021, 3:23 PM [place] [id number], [address] Office [phone] Fax [phone]

## 2021-10-22 NOTE — Telephone Encounter (Signed)
Pt agreeable to tele pre op appt 10/23/21 @ 9 am. Med rec and consent are done.     Patient Consent for Virtual Visit        Scott Tucker has provided verbal consent on 10/22/2021 for a virtual visit (video or telephone).   CONSENT FOR VIRTUAL VISIT FOR:  Scott Tucker  By participating in this virtual visit I agree to the following:  I hereby voluntarily request, consent and authorize [facility] and its employed or contracted physicians, physician assistants, nurse practitioners or other licensed health care professionals (the Practitioner), to provide me with telemedicine health care services (the "Services") as deemed necessary by the treating Practitioner. I acknowledge and consent to receive the Services by the Practitioner via telemedicine. I understand that the telemedicine visit will involve communicating with the Practitioner through live audiovisual communication technology and the disclosure of certain medical information by electronic transmission. I acknowledge that I have been given the opportunity to request an in-person assessment or other available alternative prior to the telemedicine visit and am voluntarily participating in the telemedicine visit.  I understand that I have the right to withhold or withdraw my consent to the use of telemedicine in the course of my care at any time, without affecting my right to future care or treatment, and that the Practitioner or I may terminate the telemedicine visit at any time. I understand that I have the right to inspect all information obtained and/or recorded in the course of the telemedicine visit and may receive copies of available information for a reasonable fee.  I understand that some of the potential risks of receiving the Services via telemedicine include:  Delay or interruption in medical evaluation due to technological equipment failure or disruption; Information transmitted may not be sufficient (e.g. poor resolution of  images) to allow for appropriate medical decision making by the Practitioner; and/or  In rare instances, security protocols could fail, causing a breach of personal health information.  Furthermore, I acknowledge that it is my responsibility to provide information about my medical history, conditions and care that is complete and accurate to the best of my ability. I acknowledge that Practitioner's advice, recommendations, and/or decision may be based on factors not within their control, such as incomplete or inaccurate data provided by me or distortions of diagnostic images or specimens that may result from electronic transmissions. I understand that the practice of medicine is not an exact science and that Practitioner makes no warranties or guarantees regarding treatment outcomes. I acknowledge that a copy of this consent can be made available to me via my patient portal ([facility]), or I can request a printed copy by calling the office of [facility].    I understand that my insurance will be billed for this visit.   I have read or had this consent read to me. I understand the contents of this consent, which adequately explains the benefits and risks of the Services being provided via telemedicine.  I have been provided ample opportunity to ask questions regarding this consent and the Services and have had my questions answered to my satisfaction. I give my informed consent for the services to be provided through the use of telemedicine in my medical care

## 2021-10-22 NOTE — Telephone Encounter (Signed)
Pt agreeable to tele pre op appt 10/23/21 @ 9 am. Med rec and consent are done.

## 2021-10-24 ENCOUNTER — Telehealth (INDEPENDENT_AMBULATORY_CARE_PROVIDER_SITE_OTHER): Payer: Self-pay

## 2021-10-24 ENCOUNTER — Ambulatory Visit (INDEPENDENT_AMBULATORY_CARE_PROVIDER_SITE_OTHER): Payer: Medicare Other | Admitting: Nurse Practitioner

## 2021-10-24 ENCOUNTER — Encounter: Payer: Self-pay | Admitting: Nurse Practitioner

## 2021-10-24 DIAGNOSIS — Z01810 Encounter for preprocedural cardiovascular examination: Secondary | ICD-10-CM

## 2021-10-24 NOTE — Telephone Encounter (Signed)
Copied from CRM [id number]. Topic: General - Other >> Oct 24, 2021  1:51 PM Ja-Kwan M wrote: Reason for CRM: Sherrie with Dr. Barbaraann Faster office asked if clearance for back surgery could be discussed during appt on 10/25/21. Cb# [phone]

## 2021-10-24 NOTE — Progress Notes (Signed)
Virtual Visit via Telephone Note   Because of Scott Tucker co-morbid illnesses, he is at least at moderate risk for complications without adequate follow up.  This format is felt to be most appropriate for this patient at this time.  The patient did not have access to video technology/had technical difficulties with video requiring transitioning to audio format only (telephone).  All issues noted in this document were discussed and addressed.  No physical exam could be performed with this format.  Please refer to the patient's chart for his consent to telehealth for Scott Tucker.  Evaluation Performed:  Preoperative cardiovascular risk assessment _____________   Date:  10/24/2021   Patient ID:  Scott Tucker, DOB 1965/12/12, MRN [id number] Patient Location:  Home Provider location:   Office  Primary Care Provider:  Grayce Sessions, NP Primary Cardiologist:  Scott Muss, MD  Chief Complaint / Patient Profile   56 y.o. y/o male with a h/o CAD with known small vessel disease by CTA, hypertension, hyperlipidemia who is pending lumbar fusion and presents today for telephonic preoperative cardiovascular risk assessment.  Past Medical History    Past Medical History:  Diagnosis Date   Anginal pain (HCC)    Anxiety    Chronic back pain    Coronary artery disease    Diabetes mellitus without complication (HCC)    type 2   Dyspnea    Headache    HTN (hypertension)    Neuromuscular disorder (HCC)    Past Surgical History:  Procedure Laterality Date   ANTERIOR CERVICAL DECOMP/DISCECTOMY FUSION N/A 12/04/2020   Procedure: ANTERIOR CERVICAL DISCECTOMY FUSION C3-4, C4-5, ALLOGRAFT, PLATE;  Surgeon: Eldred Manges, MD;  Location: [facility] OR;  Service: Orthopedics;  Laterality: N/A;  needs RNFA   BACK SURGERY     1992 and 2003(dr kritzer)   CARDIAC CATHETERIZATION     SHOULDER SURGERY Right    2017   TONSILLECTOMY     removed as a child    Allergies  Allergies   Allergen Reactions   Latex Rash    History of Present Illness    Scott Tucker is a 56 y.o. male who presents via audio/video conferencing for a telehealth visit today.  Pt was last seen in cardiology clinic on 11/30/2020 by Dr. Eldridge Dace.  At that time Scott Tucker was doing well.  The patient is now pending procedure as outlined above. Since his last visit, he denies chest pain, shortness of breath, lower extremity edema, fatigue, palpitations, melena, hematuria, hemoptysis, diaphoresis, weakness, presyncope, syncope, orthopnea, and PND.   Home Medications    Prior to Admission medications   Medication Sig Start Date End Date Taking? Authorizing Provider  amLODipine (NORVASC) 10 MG tablet Take 1 tablet (10 mg total) by mouth daily. 04/26/21 04/26/22  Scott Sessions, NP  amoxicillin-clavulanate (AUGMENTIN) 875-125 MG tablet Take 1 tablet by mouth 2 (two) times daily. 09/06/21   Mayers, Cari S, PA-C  aspirin 81 MG EC tablet Take 81 mg by mouth daily.    [provider]  atorvastatin (LIPITOR) 10 MG tablet Take 1 tablet (10 mg total) by mouth daily at 6 PM. 12/05/20 12/05/21  Eldred Manges, MD  cetirizine (ZYRTEC) 10 MG tablet Take 1 tablet (10 mg total) by mouth daily. 09/06/21   Mayers, Cari S, PA-C  cyclobenzaprine (FLEXERIL) 10 MG tablet Take 1 tablet (10 mg total) by mouth 3 (three) times daily as needed for muscle spasms. 01/25/21   Gwinda Passe  P, NP  FLUoxetine (PROZAC) 20 MG capsule Take 1 capsule (20 mg total) by mouth daily. 04/26/21   Scott Sessions, NP  HYDROcodone-acetaminophen (NORCO) 10-325 MG tablet Take 0.5 tablets by mouth every 6 (six) hours as needed. 09/28/21   Kerrin Champagne, MD  HYDROcodone-acetaminophen (NORCO/VICODIN) 5-325 MG tablet Take 1 tablet by mouth every 6 (six) hours as needed for moderate pain. 07/27/21   Kerrin Champagne, MD  losartan (COZAAR) 25 MG tablet Take 1 tablet (25 mg total) by mouth daily. Please make overdue appt with Dr.  Eldridge Dace before anymore refills. Thank you 1st attempt 04/26/21   Scott Sessions, NP  nitroGLYCERIN (NITROSTAT) 0.4 MG SL tablet Place 1 tablet (0.4 mg total) under the tongue every 5 (five) minutes as needed for chest pain. 04/26/21   Scott Sessions, NP  terbinafine (LAMISIL) 250 MG tablet Take 1 tablet by mouth daily Patient not taking: Reported on 09/06/2021 11/22/20       Physical Exam    Vital Signs:  Scott Tucker does not have vital signs available for review today.  Given telephonic nature of communication, physical exam is limited. AAOx3. NAD. Normal affect.  Speech and respirations are unlabored.  Accessory Clinical Findings    None  Assessment & Plan    1.  Preoperative Cardiovascular Risk Assessment: He is doing well from a cardiac perspective and may proceed with surgery without further testing. According to the Revised Cardiac Risk Index (RCRI), his Perioperative Risk of Major Cardiac Event is (%): 0.4. His Functional Capacity in METs is: 5.07 according to the Duke Activity Status Index (DASI).  There is no request for patient to hold aspirin perioperatively. Ideally aspirin should be continued without interruption, however if the bleeding risk is too great, aspirin may be held for 7 days prior to surgery. Please resume aspirin post operatively when it is felt to be safe from a bleeding standpoint.   A copy of this note will be routed to requesting surgeon.  Time:   Today, I have spent 10 minutes with the patient with telehealth technology discussing medical history, symptoms, and management plan.    [name], NP-C    10/24/2021, 9:07 AM [facility] [address], [address] Office [phone] Fax [phone]

## 2021-10-25 ENCOUNTER — Ambulatory Visit (INDEPENDENT_AMBULATORY_CARE_PROVIDER_SITE_OTHER): Payer: Medicare Other | Admitting: Primary Care

## 2021-10-25 ENCOUNTER — Other Ambulatory Visit: Payer: Self-pay

## 2021-10-25 ENCOUNTER — Encounter (INDEPENDENT_AMBULATORY_CARE_PROVIDER_SITE_OTHER): Payer: Self-pay | Admitting: Primary Care

## 2021-10-25 VITALS — BP 132/83 | HR 74 | Temp 98.3°F | Ht 67.5 in | Wt 192.2 lb

## 2021-10-25 DIAGNOSIS — E559 Vitamin D deficiency, unspecified: Secondary | ICD-10-CM | POA: Diagnosis not present

## 2021-10-25 DIAGNOSIS — Z1211 Encounter for screening for malignant neoplasm of colon: Secondary | ICD-10-CM

## 2021-10-25 DIAGNOSIS — F4323 Adjustment disorder with mixed anxiety and depressed mood: Secondary | ICD-10-CM | POA: Diagnosis not present

## 2021-10-25 DIAGNOSIS — Z760 Encounter for issue of repeat prescription: Secondary | ICD-10-CM | POA: Diagnosis not present

## 2021-10-25 DIAGNOSIS — I10 Essential (primary) hypertension: Secondary | ICD-10-CM | POA: Diagnosis not present

## 2021-10-25 DIAGNOSIS — E119 Type 2 diabetes mellitus without complications: Secondary | ICD-10-CM

## 2021-10-25 DIAGNOSIS — L602 Onychogryphosis: Secondary | ICD-10-CM

## 2021-10-25 LAB — POCT GLYCOSYLATED HEMOGLOBIN (HGB A1C): Hemoglobin A1C: 7.4 % — AB (ref 4.0–5.6)

## 2021-10-25 MED ORDER — EMPAGLIFLOZIN 25 MG PO TABS
25.0000 mg | ORAL_TABLET | Freq: Every day | ORAL | 1 refills | Status: DC
  Filled 2021-10-25: qty 90, 90d supply, fill #0

## 2021-10-25 MED ORDER — AMLODIPINE BESYLATE 10 MG PO TABS
10.0000 mg | ORAL_TABLET | Freq: Every day | ORAL | 1 refills | Status: DC
  Filled 2021-10-25: qty 90, 90d supply, fill #0

## 2021-10-25 MED ORDER — LOSARTAN POTASSIUM 25 MG PO TABS
25.0000 mg | ORAL_TABLET | Freq: Every day | ORAL | 1 refills | Status: DC
  Filled 2021-10-25: qty 90, 90d supply, fill #0

## 2021-10-25 MED ORDER — TRAZODONE HCL 50 MG PO TABS
25.0000 mg | ORAL_TABLET | Freq: Every evening | ORAL | 3 refills | Status: DC | PRN
  Filled 2021-10-25: qty 30, 30d supply, fill #0

## 2021-10-25 NOTE — Patient Instructions (Signed)
Recombinant Zoster (Shingles) Vaccine: What You Need to Know 1. Why get vaccinated? Recombinant zoster (shingles) vaccine can prevent shingles. Shingles (also called herpes zoster, or just zoster) is a painful skin rash, usually with blisters. In addition to the rash, shingles can cause fever, headache, chills, or upset stomach. Rarely, shingles can lead to complications such as pneumonia, hearing problems, blindness, brain inflammation (encephalitis), or death. The risk of shingles increases with age. The most common complication of shingles is long-term nerve pain called postherpetic neuralgia (PHN). PHN occurs in the areas where the shingles rash was and can last for months or years after the rash goes away. The pain from PHN can be severe and debilitating. The risk of PHN increases with age. An older adult with shingles is more likely to develop PHN and have longer lasting and more severe pain than a younger person. People with weakened immune systems also have a higher risk of getting shingles and complications from the disease. Shingles is caused by varicella-zoster virus, the same virus that causes chickenpox. After you have chickenpox, the virus stays in your body and can cause shingles later in life. Shingles cannot be passed from one person to another, but the virus that causes shingles can spread and cause chickenpox in someone who has never had chickenpox or has never received chickenpox vaccine. 2. Recombinant shingles vaccine Recombinant shingles vaccine provides strong protection against shingles. By preventing shingles, recombinant shingles vaccine also protects against PHN and other complications. Recombinant shingles vaccine is recommended for: Adults 50 years and older Adults 19 years and older who have a weakened immune system because of disease or treatments Shingles vaccine is given as a two-dose series. For most people, the second dose should be given 2 to 6 months after the first  dose. Some people who have or will have a weakened immune system can get the second dose 1 to 2 months after the first dose. Ask your health care provider for guidance. People who have had shingles in the past and people who have received varicella (chickenpox) vaccine are recommended to get recombinant shingles vaccine. The vaccine is also recommended for people who have already gotten another type of shingles vaccine, the live shingles vaccine. There is no live virus in recombinant shingles vaccine. Shingles vaccine may be given at the same time as other vaccines. 3. Talk with your health care provider Tell your vaccination provider if the person getting the vaccine: Has had an allergic reaction after a previous dose of recombinant shingles vaccine, or has any severe, life-threatening allergies Is currently experiencing an episode of shingles Is pregnant In some cases, your health care provider may decide to postpone shingles vaccination until a future visit. People with minor illnesses, such as a cold, may be vaccinated. People who are moderately or severely ill should usually wait until they recover before getting recombinant shingles vaccine. Your health care provider can give you more information. 4. Risks of a vaccine reaction A sore arm with mild or moderate pain is very common after recombinant shingles vaccine. Redness and swelling can also happen at the site of the injection. Tiredness, muscle pain, headache, shivering, fever, stomach pain, and nausea are common after recombinant shingles vaccine. These side effects may temporarily prevent a vaccinated person from doing regular activities. Symptoms usually go away on their own in 2 to 3 days. You should still get the second dose of recombinant shingles vaccine even if you had one of these reactions after the first dose. Guillain-Barr   syndrome (GBS), a serious nervous system disorder, has been reported very rarely after recombinant zoster  vaccine. People sometimes faint after medical procedures, including vaccination. Tell your provider if you feel dizzy or have vision changes or ringing in the ears. As with any medicine, there is a very remote chance of a vaccine causing a severe allergic reaction, other serious injury, or death. 5. What if there is a serious problem? An allergic reaction could occur after the vaccinated person leaves the clinic. If you see signs of a severe allergic reaction (hives, swelling of the face and throat, difficulty breathing, a fast heartbeat, dizziness, or weakness), call 9-1-1 and get the person to the nearest hospital. For other signs that concern you, call your health care provider. Adverse reactions should be reported to the Vaccine Adverse Event Reporting System (VAERS). Your health care provider will usually file this report, or you can do it yourself. Visit the VAERS website at www.vaers.hhs.gov or call 1-800-822-7967. VAERS is only for reporting reactions, and VAERS staff members do not give medical advice. 6. How can I learn more? Ask your health care provider. Call your local or state health department. Visit the website of the Food and Drug Administration (FDA) for vaccine package inserts and additional information at www.fda.gov/vaccinesblood-biologics/vaccines. Contact the Centers for Disease Control and Prevention (CDC): Call 1-800-232-4636 (1-800-CDC-INFO) or Visit CDC's website at www.cdc.gov/vaccines. Source: CDC Vaccine Information Statement Recombinant Zoster Vaccine (06/16/2020) This same material is available at www.cdc.gov for no charge. This information is not intended to replace advice given to you by your health care provider. Make sure you discuss any questions you have with your health care provider. Document Revised: 03/28/2021 Document Reviewed: 06/30/2020 Elsevier Patient Education  2023 [employer] Inc.  

## 2021-10-25 NOTE — Progress Notes (Unsigned)
Subjective:  Patient ID: Scott Tucker, male    DOB: 22-Feb-1966  Age: 56 y.o. MRN: [id number]  CC: Diabetes   HPI Scott Tucker presents for Follow-up of diabetes. Patient does not check blood sugar at home  Compliant with meds - No Checking CBGs? No  Fasting avg -   Postprandial average -  Exercising regularly? - No Watching carbohydrate intake? - No Neuropathy ? - Yes Hypoglycemic events - No  - Recovers with :   Pertinent ROS:  Polyuria - No Polydipsia - No Vision problems - No  Medications as noted below. Taking them regularly without complication/adverse reaction being reported today.   History Scott Tucker has a past medical history of Anginal pain (HCC), Anxiety, Chronic back pain, Coronary artery disease, Diabetes mellitus without complication (HCC), Dyspnea, Headache, HTN (hypertension), and Neuromuscular disorder (HCC).   He has a past surgical history that includes Back surgery; Shoulder surgery (Right); Cardiac catheterization; Tonsillectomy; and Anterior cervical decomp/discectomy fusion (N/A, 12/04/2020).   His family history includes Heart disease in his maternal uncle.He reports that he has never smoked. He has never used smokeless tobacco. He reports current alcohol use. He reports current drug use. Drug: Marijuana.  Current Outpatient Medications on File Prior to Visit  Medication Sig Dispense Refill   amLODipine (NORVASC) 10 MG tablet Take 1 tablet (10 mg total) by mouth daily. 90 tablet 1   aspirin 81 MG EC tablet Take 81 mg by mouth daily.     atorvastatin (LIPITOR) 10 MG tablet Take 1 tablet (10 mg total) by mouth daily at 6 PM.     FLUoxetine (PROZAC) 20 MG capsule Take 1 capsule (20 mg total) by mouth daily. 90 capsule 1   losartan (COZAAR) 25 MG tablet Take 1 tablet (25 mg total) by mouth daily. Please make overdue appt with Dr. Eldridge Dace before anymore refills. Thank you 1st attempt 90 tablet 1   nitroGLYCERIN (NITROSTAT) 0.4 MG SL tablet Place 1  tablet (0.4 mg total) under the tongue every 5 (five) minutes as needed for chest pain. 75 tablet 1   cetirizine (ZYRTEC) 10 MG tablet Take 1 tablet (10 mg total) by mouth daily. (Patient not taking: Reported on 10/25/2021) 30 tablet 11   No current facility-administered medications on file prior to visit.    ROS Comprehensive ROS Pertinent positive and negative noted in HPI    Objective:  BP 132/83   Pulse 74   Temp 98.3 F (36.8 C) (Oral)   Ht 5' 7.5" (1.715 m)   Wt 192 lb 3.2 oz (87.2 kg)   SpO2 94%   BMI 29.66 kg/m   BP Readings from Last 3 Encounters:  10/25/21 132/83  09/28/21 (!) 137/91  09/11/21 136/87    Wt Readings from Last 3 Encounters:  10/25/21 192 lb 3.2 oz (87.2 kg)  09/28/21 197 lb (89.4 kg)  09/11/21 197 lb (89.4 kg)    Physical Exam Vitals reviewed.  Constitutional:      Appearance: Normal appearance.  HENT:     Head: Normocephalic.     Right Ear: Tympanic membrane and external ear normal.     Left Ear: Tympanic membrane and external ear normal.     Nose: Nose normal.  Eyes:     Extraocular Movements: Extraocular movements intact.     Conjunctiva/sclera: Conjunctivae normal.     Pupils: Pupils are equal, round, and reactive to light.  Cardiovascular:     Rate and Rhythm: Normal rate and regular rhythm.  Pulmonary:     Effort: Pulmonary effort is normal.     Breath sounds: Normal breath sounds.  Abdominal:     General: Bowel sounds are normal. There is distension.     Palpations: Abdomen is soft.  Musculoskeletal:        General: Normal range of motion.     Cervical back: Normal range of motion. Rigidity present.  Skin:    General: Skin is warm and dry.  Neurological:     Mental Status: He is alert and oriented to person, place, and time.     Lab Results  Component Value Date   HGBA1C 6.0 01/25/2021   HGBA1C 6.1 (A) 10/27/2020   HGBA1C 8.6 (A) 07/27/2020    Lab Results  Component Value Date   WBC 19.9 (H) 12/05/2020   HGB 12.7  (L) 12/05/2020   HCT 37.0 (L) 12/05/2020   PLT 338 12/05/2020   GLUCOSE 139 (H) 12/05/2020   CHOL 138 07/27/2020   TRIG 69 07/27/2020   HDL 46 07/27/2020   LDLCALC 78 07/27/2020   ALT 38 11/30/2020   AST 24 11/30/2020   NA 135 12/05/2020   K 4.0 12/05/2020   CL 103 12/05/2020   CREATININE 0.53 (L) 12/05/2020   BUN 8 12/05/2020   CO2 24 12/05/2020   HGBA1C 6.0 01/25/2021     Assessment & Plan:  Scott Tucker was seen today for diabetes.  Diagnoses and all orders for this visit:  Diabetes mellitus without complication (HCC) Monitor foods that are high in carbohydrates are the following rice, potatoes, breads, sugars, and pastas.  Reduction in the intake (eating) will assist in lowering your blood sugars.  Started empagliflozin (JARDIANCE) 25 MG TABS tablet  -     HgB A1c 7.4 -     Comprehensive metabolic panel -     Lipid panel -     CBC with Differential/Platelet  Colon cancer screening -     Ambulatory referral to Gastroenterology  Onychogryphosis -     Ambulatory referral to Podiatry  Vitamin D deficiency Vitamin D is needed to make and keep bones strong. The patient will need to take a prescription strength vitamin D tablet once weekly until next appointment.  Vitamin D level will be rechecked at future visit.  I have sent the vitamin D tablet to the pharmacy and it should be ready for pick up.  Please remind patient of upcoming appointments and/or schedule for follow-up if needed in 6-8 weeks.  -     VITAMIN D 25 Hydroxy (Vit-D Deficiency, Fractures)  Adjustment disorder with mixed anxiety and depressed mood -     traZODone (DESYREL) 50 MG tablet; Take 0.5-1 tablets (25-50 mg total) by mouth once nightly at bedtime as needed for sleep.  Essential hypertension Counseled on blood pressure goal of less than 130/80, low-sodium, DASH diet, medication compliance, 150 minutes of moderate intensity exercise per week. Discussed medication compliance, adverse effects.  -      losartan (COZAAR) 25 MG tablet; Take 1 tablet (25 mg total) by mouth once daily. (Please make overdue appt with Dr. Eldridge Dace before anymore refills. Thank you 1st attempt) -     amLODipine (NORVASC) 10 MG tablet; Take 1 tablet (10 mg total) by mouth once daily.  Medication refill -     losartan (COZAAR) 25 MG tablet; Take 1 tablet (25 mg total) by mouth once daily. (Please make overdue appt with Dr. Eldridge Dace before anymore refills. Thank you 1st attempt) -  amLODipine (NORVASC) 10 MG tablet; Take 1 tablet (10 mg total) by mouth once daily.  Other orders -     empagliflozin (JARDIANCE) 25 MG TABS tablet; Take 1 tablet (25 mg total) by mouth once daily before breakfast.    Flowsheet Row Office Visit from 10/25/2021 in [facility]  PHQ-9 Total Score 12       I have discontinued Derrico W. Revak's terbinafine, cyclobenzaprine, HYDROcodone-acetaminophen, amoxicillin-clavulanate, and HYDROcodone-acetaminophen. I am also having him maintain his aspirin EC, atorvastatin, amLODipine, FLUoxetine, losartan, nitroGLYCERIN, and cetirizine.  No orders of the defined types were placed in this encounter.    Follow-up:   No follow-ups on file.  The above assessment and management plan was discussed with the patient. The patient verbalized understanding of and has agreed to the management plan. Patient is aware to call the clinic if symptoms fail to improve or worsen. Patient is aware when to return to the clinic for a follow-up visit. Patient educated on when it is appropriate to go to the emergency department.   Gwinda Passe, NP-C

## 2021-10-26 ENCOUNTER — Other Ambulatory Visit: Payer: Self-pay

## 2021-10-26 LAB — CBC WITH DIFFERENTIAL/PLATELET
Basophils Absolute: 0.1 10*3/uL (ref 0.0–0.2)
Basos: 1 %
EOS (ABSOLUTE): 0.1 10*3/uL (ref 0.0–0.4)
Eos: 1 %
Hematocrit: 47.2 % (ref 37.5–51.0)
Hemoglobin: 15.8 g/dL (ref 13.0–17.7)
Immature Grans (Abs): 0 10*3/uL (ref 0.0–0.1)
Immature Granulocytes: 0 %
Lymphocytes Absolute: 2.4 10*3/uL (ref 0.7–3.1)
Lymphs: 30 %
MCH: 31.7 pg (ref 26.6–33.0)
MCHC: 33.5 g/dL (ref 31.5–35.7)
MCV: 95 fL (ref 79–97)
Monocytes Absolute: 0.4 10*3/uL (ref 0.1–0.9)
Monocytes: 5 %
Neutrophils Absolute: 5.1 10*3/uL (ref 1.4–7.0)
Neutrophils: 63 %
Platelets: 392 10*3/uL (ref 150–450)
RBC: 4.99 x10E6/uL (ref 4.14–5.80)
RDW: 12.4 % (ref 11.6–15.4)
WBC: 8.1 10*3/uL (ref 3.4–10.8)

## 2021-10-26 LAB — COMPREHENSIVE METABOLIC PANEL
ALT: 21 IU/L (ref 0–44)
AST: 12 IU/L (ref 0–40)
Albumin/Globulin Ratio: 2.2 (ref 1.2–2.2)
Albumin: 5 g/dL — ABNORMAL HIGH (ref 3.8–4.9)
Alkaline Phosphatase: 107 IU/L (ref 44–121)
BUN/Creatinine Ratio: 10 (ref 9–20)
BUN: 7 mg/dL (ref 6–24)
Bilirubin Total: 0.5 mg/dL (ref 0.0–1.2)
CO2: 22 mmol/L (ref 20–29)
Calcium: 9.6 mg/dL (ref 8.7–10.2)
Chloride: 101 mmol/L (ref 96–106)
Creatinine, Ser: 0.67 mg/dL — ABNORMAL LOW (ref 0.76–1.27)
Globulin, Total: 2.3 g/dL (ref 1.5–4.5)
Glucose: 168 mg/dL — ABNORMAL HIGH (ref 70–99)
Potassium: 4.1 mmol/L (ref 3.5–5.2)
Sodium: 141 mmol/L (ref 134–144)
Total Protein: 7.3 g/dL (ref 6.0–8.5)
eGFR: 110 mL/min/{1.73_m2} (ref >59)

## 2021-10-26 LAB — VITAMIN D 25 HYDROXY (VIT D DEFICIENCY, FRACTURES): Vit D, 25-Hydroxy: 10.3 ng/mL — ABNORMAL LOW (ref 30.0–100.0)

## 2021-10-26 LAB — LIPID PANEL
Chol/HDL Ratio: 5 ratio (ref 0.0–5.0)
Cholesterol, Total: 220 mg/dL — ABNORMAL HIGH (ref 100–199)
HDL: 44 mg/dL (ref >39)
LDL Chol Calc (NIH): 158 mg/dL — ABNORMAL HIGH (ref 0–99)
Triglycerides: 98 mg/dL (ref 0–149)
VLDL Cholesterol Cal: 18 mg/dL (ref 5–40)

## 2021-10-30 IMAGING — MR MR CERVICAL SPINE W/O CM
4 of 5 series · 19 of 48 positions shown · non-contrast
Comparison: None.

CLINICAL DATA: Cervical spine stenosis with chronic right shoulder
pain

EXAM:
MRI CERVICAL SPINE WITHOUT CONTRAST
TECHNIQUE: Multiplanar, multisequence MR imaging of the cervical spine was
performed. No intravenous contrast was administered.

[Series 2: T2 · sagittal · 3.0mm · 0.43mm/px · 5 of 16 slices shown (1 of 2)]
[im 1/16]
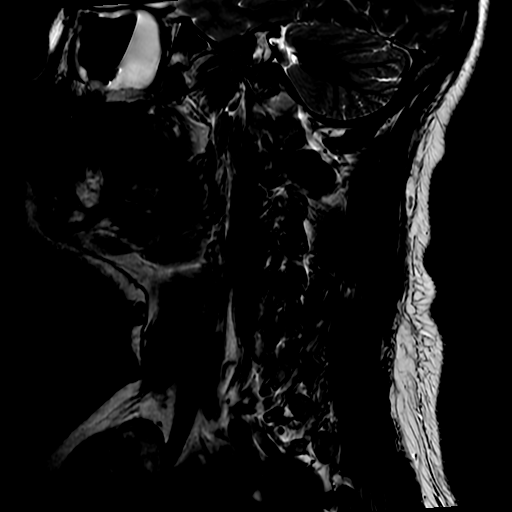
[im 4/16]
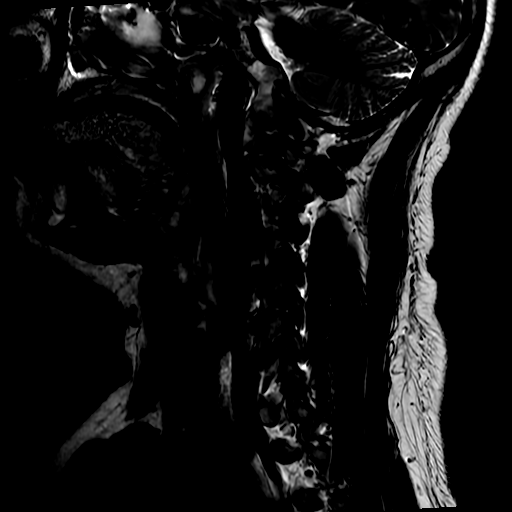
[im 8/16]
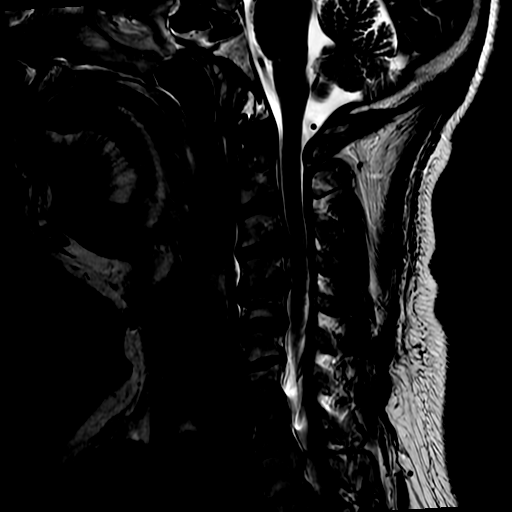
[im 12/16]
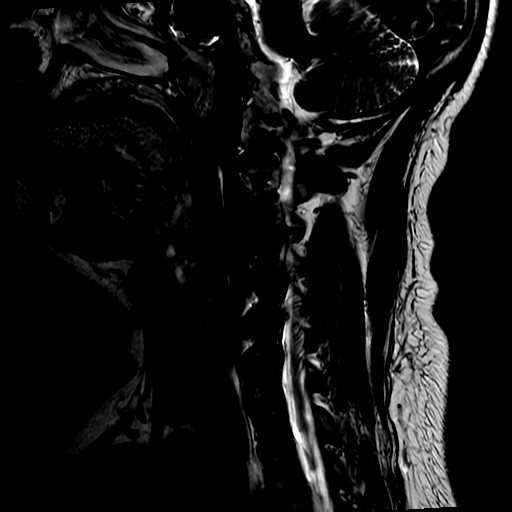
[im 16/16]
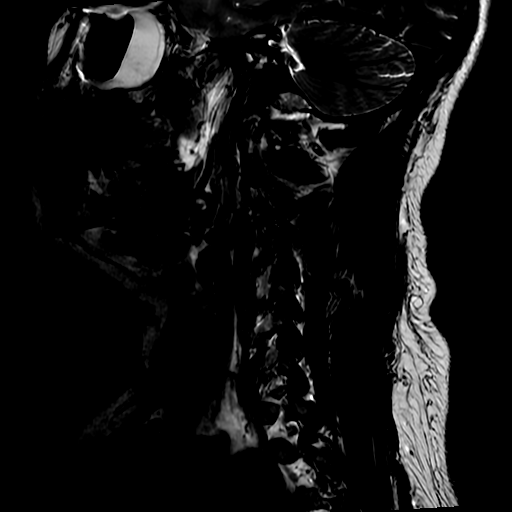

[Series 3: FLAIR · sagittal · 3.0mm · 0.43mm/px · 3 of 16 slices shown]
[im 1/16]
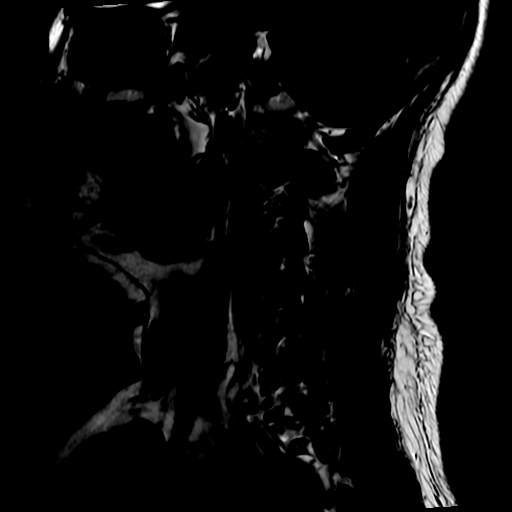
[im 11/16]
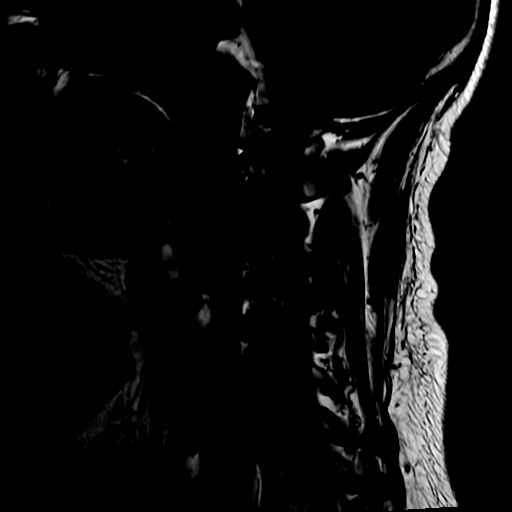
[im 16/16]
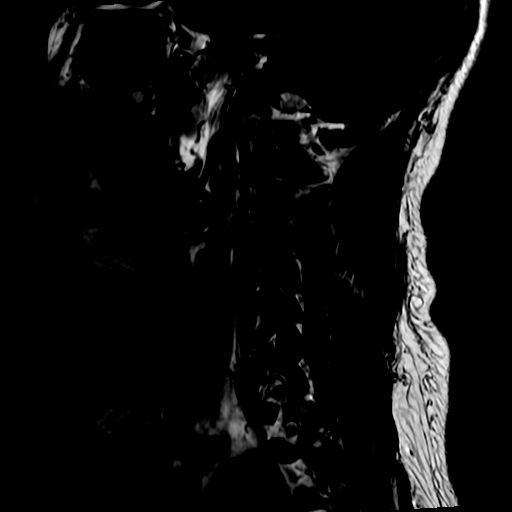

[Series 4: STIR · sagittal · 3.0mm · 0.43mm/px · 3 of 16 slices shown]
[im 1/16]
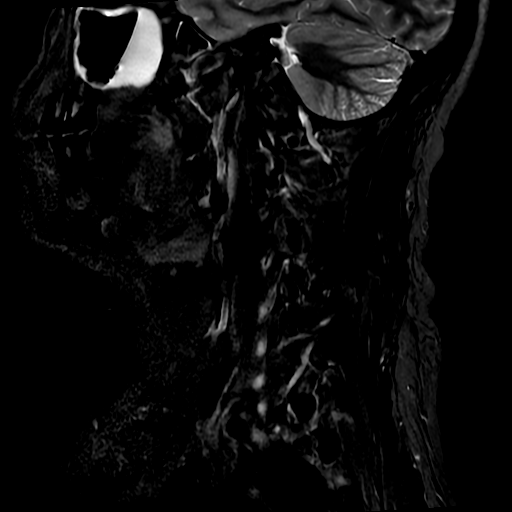
[im 11/16]
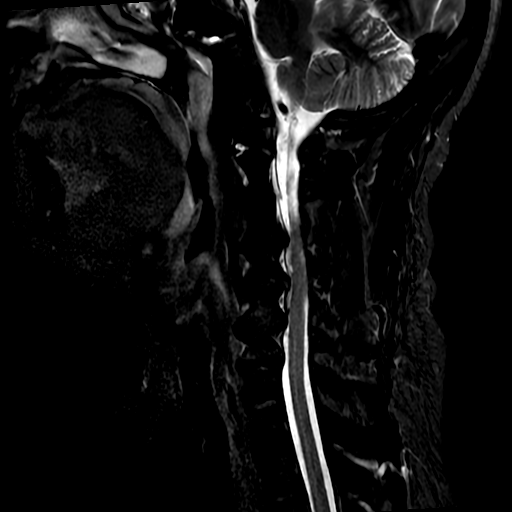
[im 16/16]
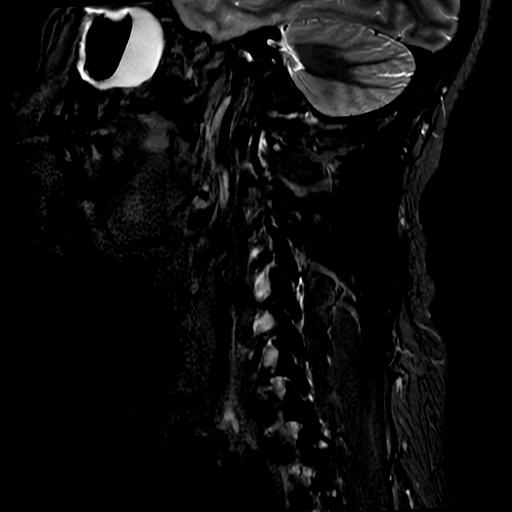

[Series 6: T2 · axial · 3.0mm · 0.35mm/px · z∈[-137,-52]mm · 8 of 32 slices shown (2 of 2)]
[im 1/32]
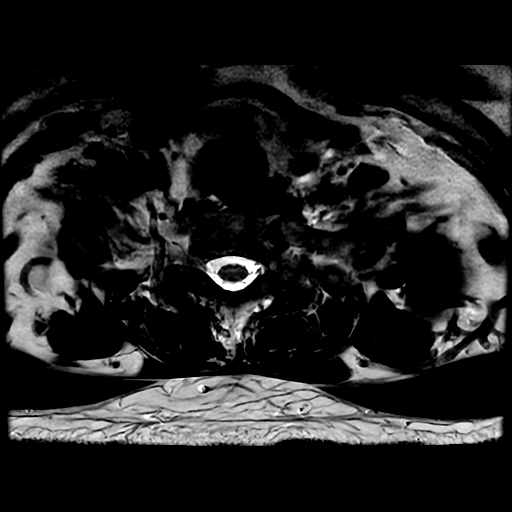
[im 4/32]
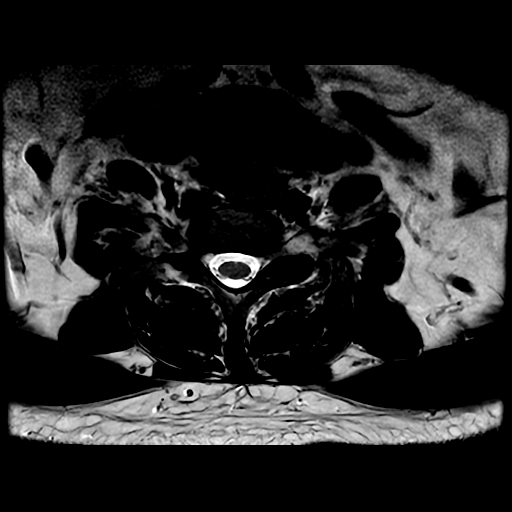
[im 8/32]
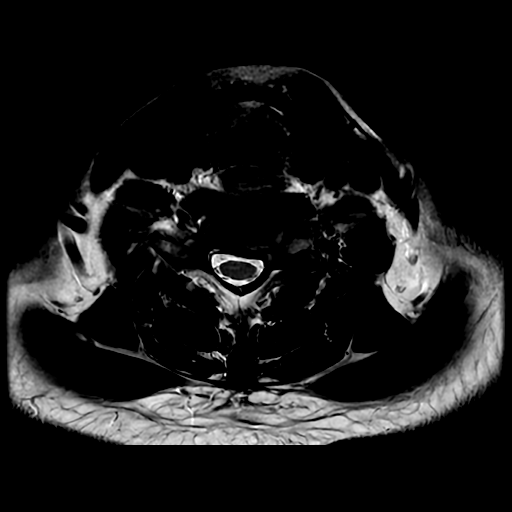
[im 12/32]
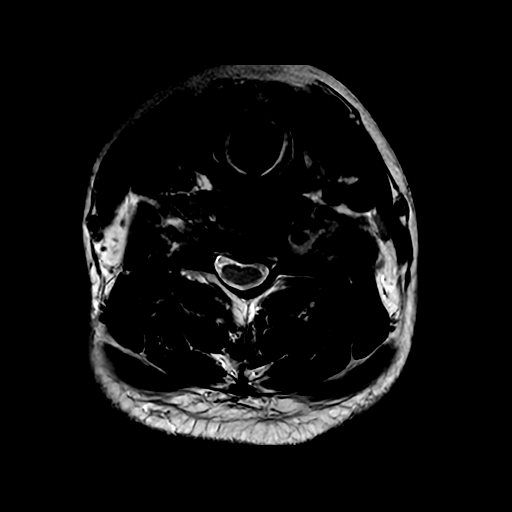
[im 16/32]
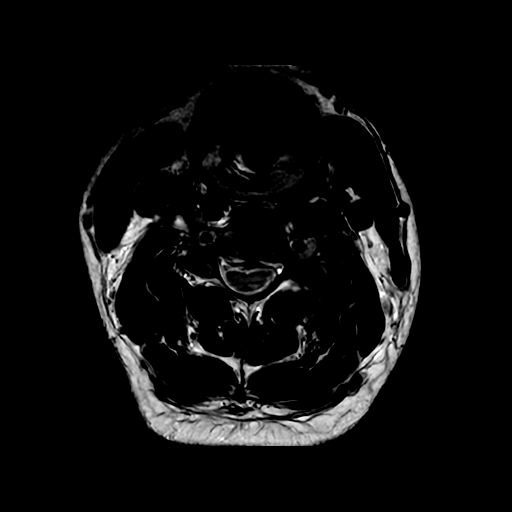
[im 20/32]
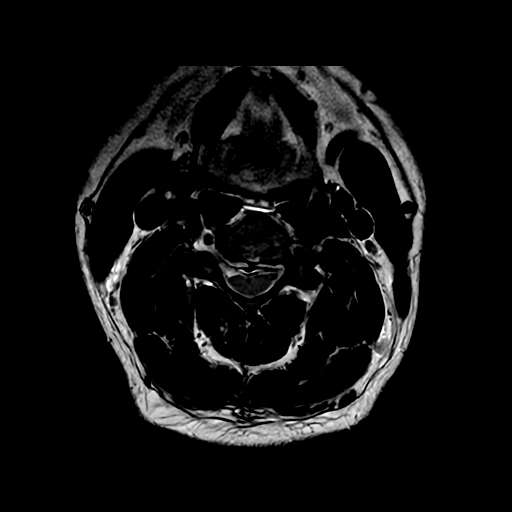
[im 24/32]
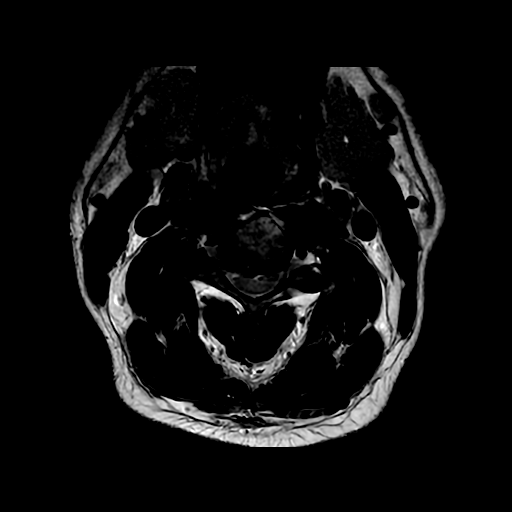
[im 28/32]
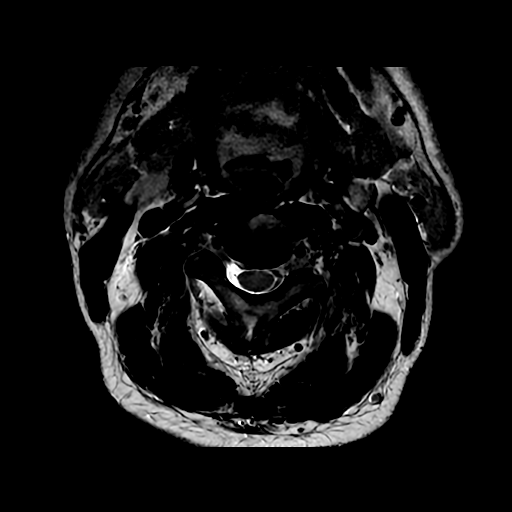

[19 of 48 positions shown; findings below may reference images not displayed]

FINDINGS: Alignment: Reversal of cervical lordosis.

Vertebrae: Discogenic endplate edema at C6-7. No fracture, discitis,
or aggressive lesion.

Cord: Degenerative compression noted above below. No cord signal
abnormality.

Posterior Fossa, vertebral arteries, paraspinal tissues: Bilateral
maxillary sinusitis with fluid level.

Disc levels:

C2-3: Unremarkable.

C3-4: Disc bulging with right paracentral inferiorly migrating
extrusion flattening the cord. Right uncovertebral ridging and
foraminal impingement.

C4-5: Disc narrowing and bulging with endplate ridging and a right
paracentral protrusion compressing the right cord. Disc narrowing
and uncovertebral spurring causes biforaminal impingement.

C5-6: Disc narrowing and bulging with ridging of the endplates and
uncovertebral joints on the left more than right. Left more than
right foraminal impingement that is high-grade

C6-7: Disc narrowing with uncovertebral ridging and high-grade left
foraminal impingement.

C7-T1:Unremarkable.
IMPRESSION: 1. C3-4 and C4-5 cord flattening due to disc herniations.
2. High-grade foraminal impingement on the right at C3-4,
bilaterally at C4-5 and C5-6, and on the left at C6-7.
3. Bilateral maxillary sinusitis with fluid levels.

## 2021-10-31 ENCOUNTER — Other Ambulatory Visit: Payer: Self-pay

## 2021-11-02 ENCOUNTER — Other Ambulatory Visit: Payer: Self-pay

## 2021-11-02 ENCOUNTER — Ambulatory Visit: Payer: Medicare Other | Admitting: Podiatry

## 2021-11-07 ENCOUNTER — Other Ambulatory Visit: Payer: Self-pay

## 2021-11-07 ENCOUNTER — Other Ambulatory Visit (INDEPENDENT_AMBULATORY_CARE_PROVIDER_SITE_OTHER): Payer: Self-pay | Admitting: Primary Care

## 2021-11-07 DIAGNOSIS — E559 Vitamin D deficiency, unspecified: Secondary | ICD-10-CM

## 2021-11-07 DIAGNOSIS — E782 Mixed hyperlipidemia: Secondary | ICD-10-CM

## 2021-11-07 MED ORDER — ERGOCALCIFEROL 1.25 MG (50000 UT) PO CAPS
50000.0000 [IU] | ORAL_CAPSULE | ORAL | 0 refills | Status: DC
  Filled 2021-11-07: qty 12, 84d supply, fill #0

## 2021-11-07 MED ORDER — ATORVASTATIN CALCIUM 40 MG PO TABS
40.0000 mg | ORAL_TABLET | Freq: Every day | ORAL | 3 refills | Status: DC
  Filled 2021-11-07: qty 90, 90d supply, fill #0

## 2021-11-14 ENCOUNTER — Other Ambulatory Visit: Payer: Self-pay

## 2021-11-14 ENCOUNTER — Ambulatory Visit (INDEPENDENT_AMBULATORY_CARE_PROVIDER_SITE_OTHER): Payer: Medicare Other | Admitting: Podiatry

## 2021-11-14 DIAGNOSIS — B351 Tinea unguium: Secondary | ICD-10-CM

## 2021-11-14 DIAGNOSIS — E119 Type 2 diabetes mellitus without complications: Secondary | ICD-10-CM

## 2021-11-14 MED ORDER — CICLOPIROX 8 % EX SOLN: Freq: Every day | CUTANEOUS | 0 refills | Status: DC

## 2021-11-14 NOTE — Progress Notes (Signed)
Subjective: Scott Tucker presents today referred by Grayce Sessions, NP for diabetic foot evaluation.  Patient relates 10 year history of diabetes.  Patient denies any history of foot wounds.  Patient denies any history of numbness, tingling, burning, pins/needles sensations.  Past Medical History:  Diagnosis Date   Anginal pain (HCC)    Anxiety    Chronic back pain    Coronary artery disease    Diabetes mellitus without complication (HCC)    type 2   Dyspnea    Headache    HTN (hypertension)    Neuromuscular disorder [facility])     Patient Active Problem List   Diagnosis Date Noted   Spinal stenosis of lumbar region 05/30/2021   S/P cervical spinal fusion 12/18/2020   Chronic radicular cervical pain 05/01/2020   Foraminal stenosis of cervical region (right) 05/01/2020   Chronic pain syndrome 05/01/2020   History of lumbar fusion 05/01/2020   HTN (hypertension)    Diabetes mellitus without complication (HCC)    Chronic back pain    Coronary artery disease    CAD (coronary artery disease) 03/22/2019   Hypertension 02/23/2019   Hyperlipidemia 02/23/2019   Chest pain 02/22/2019    Past Surgical History:  Procedure Laterality Date   ANTERIOR CERVICAL DECOMP/DISCECTOMY FUSION N/A 12/04/2020   Procedure: ANTERIOR CERVICAL DISCECTOMY FUSION C3-4, C4-5, ALLOGRAFT, PLATE;  Surgeon: Eldred Manges, MD;  Location: [facility] OR;  Service: Orthopedics;  Laterality: N/A;  needs RNFA   BACK SURGERY     1992 and 2003(dr kritzer)   CARDIAC CATHETERIZATION     SHOULDER SURGERY Right    2017   TONSILLECTOMY     removed as a child    Current Outpatient Medications on File Prior to Visit  Medication Sig Dispense Refill   amLODipine (NORVASC) 10 MG tablet Take 1 tablet (10 mg total) by mouth once daily. 90 tablet 1   aspirin 81 MG EC tablet Take 81 mg by mouth daily.     atorvastatin (LIPITOR) 40 MG tablet Take 1 tablet (40 mg total) by mouth daily. 90 tablet 3   cetirizine (ZYRTEC) 10  MG tablet Take 1 tablet (10 mg total) by mouth daily. (Patient not taking: Reported on 10/25/2021) 30 tablet 11   empagliflozin (JARDIANCE) 25 MG TABS tablet Take 1 tablet (25 mg total) by mouth once daily before breakfast. 90 tablet 1   ergocalciferol (VITAMIN D2) 1.25 MG (50000 UT) capsule Take 1 capsule (50,000 Units total) by mouth once a week. 12 capsule 0   losartan (COZAAR) 25 MG tablet Take 1 tablet (25 mg total) by mouth once daily. (Please make overdue appt with Dr. Eldridge Dace before anymore refills. Thank you 1st attempt) 90 tablet 1   nitroGLYCERIN (NITROSTAT) 0.4 MG SL tablet Place 1 tablet (0.4 mg total) under the tongue every 5 (five) minutes as needed for chest pain. 75 tablet 1   traZODone (DESYREL) 50 MG tablet Take 0.5-1 tablets (25-50 mg total) by mouth once nightly at bedtime as needed for sleep. 30 tablet 3   No current facility-administered medications on file prior to visit.     Allergies  Allergen Reactions   Latex Rash    Social History   Occupational History   Not on file  Tobacco Use   Smoking status: Never   Smokeless tobacco: Never  Vaping Use   Vaping Use: Never used  Substance and Sexual Activity   Alcohol use: Yes    Comment: 1-2 glasses a day  Drug use: Yes    Types: Marijuana    Comment: 3 times a day   Sexual activity: Not Currently    Family History  Problem Relation Age of Onset   Heart disease Maternal Uncle     Immunization History  Administered Date(s) Administered   PFIZER(Purple Top)SARS-COV-2 Vaccination 06/13/2020, 07/05/2020   Tdap 03/16/2019    Review of systems: Positive Findings in bold print.  Constitutional:  chills, fatigue, fever, sweats, weight change Communication: Nurse, learning disability, sign Presenter, broadcasting, hand writing, iPad/Android device Head: headaches, head injury Eyes: changes in vision, eye pain, glaucoma, cataracts, macular degeneration, diplopia, glare,  light sensitivity, eyeglasses or contacts, blindness Ears  nose mouth throat: hearing impaired, hearing aids,  ringing in ears, deaf, sign language,  vertigo, nosebleeds,  rhinitis,  cold sores, snoring, swollen glands Cardiovascular: HTN, edema, arrhythmia, pacemaker in place, defibrillator in place, chest pain/tightness, chronic anticoagulation, blood clot, heart failure, MI Peripheral Vascular: leg cramps, varicose veins, blood clots, lymphedema, varicosities Respiratory:  asthma, difficulty breathing, denies congestion, SOB, wheezing, cough, emphysema Gastrointestinal: change in appetite or weight, abdominal pain, constipation, diarrhea, nausea, vomiting, vomiting blood, change in bowel habits, abdominal pain, jaundice, rectal bleeding, hemorrhoids, GERD Genitourinary:  nocturia,  pain on urination, polyuria,  blood in urine, Foley catheter, urinary urgency, ESRD on hemodialysis Musculoskeletal: amputation, cramping, stiff joints, painful joints, decreased joint motion, fractures, OA, gout, hemiplegia, paraplegia, uses cane, wheelchair bound, uses walker, uses rollator Skin: +changes in toenails, color change, dryness, itching, mole changes,  rash, wound(s) Neurological: headaches, numbness in feet, paresthesias in feet, burning in feet, fainting,  seizures, change in speech, migraines, memory problems/poor historian, cerebral palsy, weakness, paralysis, CVA, TIA Endocrine: diabetes, hypothyroidism, hyperthyroidism,  goiter, dry mouth, flushing, heat intolerance, cold intolerance,  excessive thirst, denies polyuria,  nocturia Hematological:  easy bleeding, excessive bleeding, easy bruising, enlarged lymph nodes, on long term blood thinner, history of past transusions Allergy/immunological:  hives, eczema, frequent infections, multiple drug allergies, seasonal allergies, transplant recipient, multiple food allergies Psychiatric:  anxiety, depression, mood disorder, suicidal ideations, hallucinations, insomnia  Objective: There were no vitals filed for this  visit. Vascular Examination: Capillary refill time less than 3 seconds x 10 digits.  Dorsalis pedis pulses palpable 2 out of 4.  Posterior tibial pulses palpable 2 out of 4.  Digital hair not present x 10 digits.  Skin temperature gradient WNL b/l.  Dermatological Examination: Skin with normal turgor, texture and tone b/l  Toenails 1-5 b/l discolored, thick, dystrophic with subungual debris and pain with palpation to nailbeds due to thickness of nails.  Musculoskeletal: Muscle strength 5/5 to all LE muscle groups.  Neurological: Sensation intact with 10 gram monofilament.  Vibratory sensation intact.  Assessment: NIDDM Encounter for diabetic foot examination Onychomycosis toenails x2  Plan: Discussed diabetic foot care principles. Literature dispensed on today. Patient to continue soft, supportive shoe gear daily. Patient to report any pedal injuries to medical professional immediately. Follow up one year. Patient/POA to call should there be a concern in the interim. Penlac was sent to the pharmacy I have asked him to apply twice a day.  Discussed with him will take 6 to 8 months to resolve

## 2021-11-23 ENCOUNTER — Other Ambulatory Visit: Payer: Self-pay | Admitting: Surgery

## 2021-11-23 ENCOUNTER — Telehealth: Payer: Self-pay | Admitting: Specialist

## 2021-11-23 ENCOUNTER — Ambulatory Visit (INDEPENDENT_AMBULATORY_CARE_PROVIDER_SITE_OTHER): Payer: Medicare Other | Admitting: Surgery

## 2021-11-23 ENCOUNTER — Encounter: Payer: Self-pay | Admitting: Surgery

## 2021-11-23 VITALS — BP 129/80 | HR 66 | Ht 67.5 in | Wt 192.2 lb

## 2021-11-23 DIAGNOSIS — M542 Cervicalgia: Secondary | ICD-10-CM

## 2021-11-23 MED ORDER — HYDROCODONE-ACETAMINOPHEN 5-325 MG PO TABS: 1.0000 | ORAL_TABLET | Freq: Two times a day (BID) | ORAL | 0 refills | Status: DC | PRN

## 2021-11-23 NOTE — Telephone Encounter (Signed)
Pt just left his appt today and he forgot to ask Dr. Otelia Sergeant for pain medication. Please call pt at [phone].

## 2021-11-23 NOTE — Telephone Encounter (Signed)
Add note. Pt pharmacy [facility] and [address]

## 2021-11-23 NOTE — Progress Notes (Signed)
56 year old black male with history of multilevel lumbar stenosis comes in for prep evaluation.  States that symptoms unchanged from previous visit.  He is wanting to proceed with TRANFORAMINAL LUMBAR INTERBODY FUSIONS LEFT L3-4 AND LEFT L4-5 WITH REMOVAL OF HARDWARE L5-S1, POSTERIOR INSTRUMENTATION L3, L4, L5 AND S1, RODS SCREWS AND CAGES, LOCAL AND ALLOGRAFT BONE GRAFT, VIVIGEN as scheduled.  We have received preop cardiac clearance.  Today history and physical performed.  Review of systems positive for headaches that he has been told is related to ongoing issues with the cervical spine.  Has left trapezius spasm that radiates pain into the occipital region..   Plan We will proceed with surgery as scheduled.  All questions answered.  Patient states that he will be needing short skilled facility placement postop for rehab.  He will not have any assistance when he is discharged from the hospital. In Regards to his left trapezius spasm I will see if I can get him scheduled with physical therapy for a few days before surgery to see if dry needling helps.

## 2021-11-27 ENCOUNTER — Encounter: Payer: Self-pay | Admitting: Physical Therapy

## 2021-11-27 ENCOUNTER — Ambulatory Visit (INDEPENDENT_AMBULATORY_CARE_PROVIDER_SITE_OTHER): Payer: Medicare Other | Admitting: Physical Therapy

## 2021-11-27 DIAGNOSIS — M542 Cervicalgia: Secondary | ICD-10-CM | POA: Diagnosis not present

## 2021-11-27 NOTE — Pre-Procedure Instructions (Signed)
Surgical Instructions    Your procedure is scheduled on Tuesday, July 25th.  Report to [facility] Main Entrance "A" at 05:30 A.M., then check in with the Admitting office.  Call this number if you have problems the morning of surgery:  [phone]   If you have any questions prior to your surgery date call [phone]: Open Monday-Friday 8am-4pm    Remember:  Do not eat after midnight the night before your surgery  You may drink clear liquids until 04:30 AM the morning of your surgery.   Clear liquids allowed are: Water, Non-Citrus Juices (without pulp), Carbonated Beverages, Clear Tea, Black Coffee Only (NO MILK, CREAM OR POWDERED CREAMER of any kind), and Gatorade.    Take these medicines the morning of surgery with A SIP OF WATER  amLODipine (NORVASC)  atorvastatin (LIPITOR) cetirizine (ZYRTEC) HYDROcodone-acetaminophen (NORCO/VICODIN)- if needed nitroGLYCERIN (NITROSTAT) - if needed   Follow your surgeon's instructions on when to stop Aspirin.  If no instructions were given by your surgeon then you will need to call the office to get those instructions.      WHAT DO I DO ABOUT MY DIABETES MEDICATION?   Hold empagliflozin (JARDIANCE) 3 days prior to surgery.     HOW TO MANAGE YOUR DIABETES BEFORE AND AFTER SURGERY  Why is it important to control my blood sugar before and after surgery? Improving blood sugar levels before and after surgery helps healing and can limit problems. A way of improving blood sugar control is eating a healthy diet by:  Eating less sugar and carbohydrates  Increasing activity/exercise  Talking with your doctor about reaching your blood sugar goals High blood sugars (greater than 180 mg/dL) can raise your risk of infections and slow your recovery, so you will need to focus on controlling your diabetes during the weeks before surgery. Make sure that the doctor who takes care of your diabetes knows about your planned surgery including the date  and location.  How do I manage my blood sugar before surgery? Check your blood sugar at least 4 times a day, starting 2 days before surgery, to make sure that the level is not too high or low.  Check your blood sugar the morning of your surgery when you wake up and every 2 hours until you get to the Short Stay unit.  If your blood sugar is less than 70 mg/dL, you will need to treat for low blood sugar: Do not take insulin. Treat a low blood sugar (less than 70 mg/dL) with  cup of clear juice (cranberry or apple), 4 glucose tablets, OR glucose gel. Recheck blood sugar in 15 minutes after treatment (to make sure it is greater than 70 mg/dL). If your blood sugar is not greater than 70 mg/dL on recheck, call [phone] for further instructions. Report your blood sugar to the short stay nurse when you get to Short Stay.  If you are admitted to the hospital after surgery: Your blood sugar will be checked by the staff and you will probably be given insulin after surgery (instead of oral diabetes medicines) to make sure you have good blood sugar levels. The goal for blood sugar control after surgery is 80-180 mg/dL.    As of today, STOP taking any Aleve, Naproxen, Ibuprofen, Motrin, Advil, Goody's, BC's, all herbal medications, fish oil, and all vitamins.                     Do NOT Smoke (Tobacco/Vaping) for 24 hours prior to  your procedure.  If you use a CPAP at night, you may bring your mask/headgear for your overnight stay.   Contacts, glasses, piercing's, hearing aid's, dentures or partials may not be worn into surgery, please bring cases for these belongings.    For patients admitted to the hospital, discharge time will be determined by your treatment team.   Patients discharged the day of surgery will not be allowed to drive home, and someone needs to stay with them for 24 hours.  SURGICAL WAITING ROOM VISITATION Patients having surgery or a procedure may have no more than 2 support  people in the waiting area - these visitors may rotate.   Children under the age of 65 must have an adult with them who is not the patient. If the patient needs to stay at the hospital during part of their recovery, the visitor guidelines for inpatient rooms apply. Pre-op nurse will coordinate an appropriate time for 1 support person to accompany patient in pre-op.  This support person may not rotate.   Please refer to the [facility] website for the visitor guidelines for Inpatients (after your surgery is over and you are in a regular room).    Special instructions:   [place]- Preparing For Surgery  Before surgery, you can play an important role. Because skin is not sterile, your skin needs to be as free of germs as possible. You can reduce the number of germs on your skin by washing with CHG (chlorahexidine gluconate) Soap before surgery.  CHG is an antiseptic cleaner which kills germs and bonds with the skin to continue killing germs even after washing.    Oral Hygiene is also important to reduce your risk of infection.  Remember - BRUSH YOUR TEETH THE MORNING OF SURGERY WITH YOUR REGULAR TOOTHPASTE  Please do not use if you have an allergy to CHG or antibacterial soaps. If your skin becomes reddened/irritated stop using the CHG.  Do not shave (including legs and underarms) for at least 48 hours prior to first CHG shower. It is OK to shave your face.  Please follow these instructions carefully.   Shower the NIGHT BEFORE SURGERY and the MORNING OF SURGERY  If you chose to wash your hair, wash your hair first as usual with your normal shampoo.  After you shampoo, rinse your hair and body thoroughly to remove the shampoo.  Use CHG Soap as you would any other liquid soap. You can apply CHG directly to the skin and wash gently with a scrungie or a clean washcloth.   Apply the CHG Soap to your body ONLY FROM THE NECK DOWN.  Do not use on open wounds or open sores. Avoid contact with your  eyes, ears, mouth and genitals (private parts). Wash Face and genitals (private parts)  with your normal soap.   Wash thoroughly, paying special attention to the area where your surgery will be performed.  Thoroughly rinse your body with warm water from the neck down.  DO NOT shower/wash with your normal soap after using and rinsing off the CHG Soap.  Pat yourself dry with a CLEAN TOWEL.  Wear CLEAN PAJAMAS to bed the night before surgery  Place CLEAN SHEETS on your bed the night before your surgery  DO NOT SLEEP WITH PETS.   Day of Surgery: Take a shower with CHG soap. Do not wear jewelry  Do not wear lotions, powders, colognes, or deodorant.  Men may shave face and neck. Do not bring valuables to the hospital.  [facility] is not responsible for any belongings or valuables.  Wear Clean/Comfortable clothing the morning of surgery Remember to brush your teeth WITH YOUR REGULAR TOOTHPASTE.   Please read over the following fact sheets that you were given.    If you received a COVID test during your pre-op visit  it is requested that you wear a mask when out in public, stay away from anyone that may not be feeling well and notify your surgeon if you develop symptoms. If you have been in contact with anyone that has tested positive in the last 10 days please notify you surgeon.

## 2021-11-27 NOTE — Progress Notes (Signed)
Cardiology Office Note:    Date:  11/29/2021   ID:  Scott, Tucker [birth year], MRN [id number]  PCP:  Scott Sessions, NP   [facility] Providers Cardiologist:  Lance Muss, MD     Referring MD: Scott Sessions, NP   Chief Complaint: annual follow-up CAD  History of Present Illness:    Scott Tucker is a very pleasant 56 y.o. male with a hx of CAD, hypertension, hyperlipidemia, diabetes, chronic back pain.  Reported to have had angioplasty in [place] around 2010.  Admitted to [facility] October 2020 with EKG changes including T wave inversions in V5 and V6.  He had a coronary CTA showing obstructive CAD and a small OM vessel.  Positive FFR in that vessel.  Medical management given the size of the vessel.  Troponins negative at that time.  He is intolerant to ACE inhibitor due to cough.  He was last seen in our office on 11/30/2020 by Dr. Eldridge Dace at which time he was cleared for neck surgery and advised to follow-up in 1 year.   He had virtual appointment for preoperative cardiac evaluation for lumbar fusion on 10/24/21 with me.  He was cleared to proceed with surgery without further testing.  Today, he is here alone for follow-up. His lumbar fusion is scheduled for 7/25 and he continues to be limited in activity by back pain, uses a walking stick. Tries to walk 3 days per week to keep his A1C down.  Additionally has some cervical spine pain.  Reports that he is doing well from a cardiac perspective. He denies chest pain, shortness of breath, lower extremity edema, fatigue, palpitations, melena, hematuria, hemoptysis, diaphoresis, weakness, presyncope, syncope, orthopnea, and PND.  No use of sublingual nitroglycerin.  Past Medical History:  Diagnosis Date   Anginal pain (HCC)    Anxiety    Arthritis    Chronic back pain    Coronary artery disease    Diabetes mellitus without complication (HCC)    type 2   Dyspnea    Headache    HTN (hypertension)     Neuromuscular disorder (HCC)     Past Surgical History:  Procedure Laterality Date   ANTERIOR CERVICAL DECOMP/DISCECTOMY FUSION N/A 12/04/2020   Procedure: ANTERIOR CERVICAL DISCECTOMY FUSION C3-4, C4-5, ALLOGRAFT, PLATE;  Surgeon: Eldred Manges, MD;  Location: [facility] OR;  Service: Orthopedics;  Laterality: N/A;  needs RNFA   BACK SURGERY     1992 and 2003(dr kritzer)   CARDIAC CATHETERIZATION     SHOULDER SURGERY Right    2017   TONSILLECTOMY     removed as a child    Current Medications: Current Meds  Medication Sig   amLODipine (NORVASC) 10 MG tablet Take 1 tablet (10 mg total) by mouth once daily.   aspirin 81 MG EC tablet Take 81 mg by mouth daily.   atorvastatin (LIPITOR) 40 MG tablet Take 1 tablet (40 mg total) by mouth daily.   cetirizine (ZYRTEC) 10 MG tablet Take 1 tablet (10 mg total) by mouth daily.   empagliflozin (JARDIANCE) 25 MG TABS tablet Take 1 tablet (25 mg total) by mouth once daily before breakfast.   HYDROcodone-acetaminophen (NORCO/VICODIN) 5-325 MG tablet Take 1 tablet by mouth every 12 (twelve) hours as needed for moderate pain.   losartan (COZAAR) 25 MG tablet Take 1 tablet (25 mg total) by mouth once daily. (Please make overdue appt with Dr. Eldridge Dace before anymore refills. Thank you 1st attempt)   nitroGLYCERIN (NITROSTAT) 0.4  MG SL tablet Place 1 tablet (0.4 mg total) under the tongue every 5 (five) minutes as needed for chest pain.     Allergies:   Latex   Social History   Socioeconomic History   Marital status: Single    Spouse name: Not on file   Number of children: 2   Years of education: Not on file   Highest education level: Not on file  Occupational History   Not on file  Tobacco Use   Smoking status: Never   Smokeless tobacco: Never  Vaping Use   Vaping Use: Never used  Substance and Sexual Activity   Alcohol use: Yes    Alcohol/week: 3.0 standard drinks of alcohol    Types: 3 Glasses of wine per week   Drug use: Yes    Types:  Marijuana    Comment: smokes mostly every day   Sexual activity: Not Currently  Other Topics Concern   Not on file  Social History Narrative   Not on file   Social Determinants of Health   Financial Resource Strain: Not on file  Food Insecurity: Not on file  Transportation Needs: Not on file  Physical Activity: Not on file  Stress: Not on file  Social Connections: Not on file     Family History: The patient's family history includes Heart disease in his maternal uncle.  ROS:   Please see the history of present illness.    + back pain All other systems reviewed and are negative.  Labs/Other Studies Reviewed:    The following studies were reviewed today:  Echo 02/22/19  1. Left ventricular ejection fraction, by visual estimation, is 55 to  60%. The left ventricle has normal function. Normal left ventricular size.  There is mildly increased left ventricular hypertrophy.   2. Global right ventricle has normal systolic function.The right  ventricular size is normal.   3. Left atrial size was normal.   4. Right atrial size was normal.   5. The mitral valve is normal in structure. Trace mitral valve  regurgitation. No evidence of mitral stenosis.   6. The tricuspid valve is normal in structure. Tricuspid valve  regurgitation is trivial.   7. The aortic valve is tricuspid Aortic valve regurgitation was not  visualized by color flow Doppler. Structurally normal aortic valve, with  no evidence of sclerosis or stenosis.   8. The pulmonic valve was normal in structure. Pulmonic valve  regurgitation is not visualized by color flow Doppler.   9. The inferior vena cava is normal in size with greater than 50%  respiratory variability, suggesting right atrial pressure of 3 mmHg.  10. Normal LV systolic and diastolic function.   CCTA 03/03/19  1. Left Main:  No significant stenosis. FFR = 1.00   2. LAD: No significant stenosis. Proximal FFR = 0.96, Mid FFR =0.95, Distal FFR =  0.87. 3. LCX: No significant stenosis. Proximal FFR = 0.94, Distal FFR = 0.83. There is a stenosis in OM1 with FFR 0.72. 4. RCA: No significant stenosis. Proximal FFR = 0.99, Mid FFR =0.97, Distal FFR = 0.95.   IMPRESSION: 1. CT FFR analysis shows significant stenosis in OM1 vessel with FFR 0.72. This is a small caliber vessel, recommend medical management but consider PCI if there are refractory symptoms.   Recent Labs: 10/25/2021: ALT 21 11/28/2021: BUN 6; Creatinine, Ser 0.67; Hemoglobin 15.3; Platelets 344; Potassium 3.7; Sodium 140  Recent Lipid Panel    Component Value Date/Time   CHOL 220 (H)  10/25/2021 0920   TRIG 98 10/25/2021 0920   HDL 44 10/25/2021 0920   CHOLHDL 5.0 10/25/2021 0920   CHOLHDL 3.7 02/23/2019 0405   VLDL 11 02/23/2019 0405   LDLCALC 158 (H) 10/25/2021 0920     Risk Assessment/Calculations:         Physical Exam:    VS:  BP 120/76   Pulse 78   Ht 5\' 6"  (1.676 m)   Wt 190 lb 6.4 oz (86.4 kg)   SpO2 97%   BMI 30.73 kg/m     Wt Readings from Last 3 Encounters:  11/28/21 190 lb (86.2 kg)  11/28/21 190 lb 6.4 oz (86.4 kg)  11/23/21 192 lb 3.2 oz (87.2 kg)     GEN:  Well nourished, well developed in no acute distress HEENT: Normal NECK: No JVD; No carotid bruits CARDIAC: RRR, no murmurs, rubs, gallops RESPIRATORY:  Clear to auscultation without rales, wheezing or rhonchi  ABDOMEN: Soft, non-tender, non-distended MUSCULOSKELETAL:  No edema; No deformity. 2+ pedal pulses, equal bilaterally SKIN: Warm and dry NEUROLOGIC:  Alert and oriented x 3 PSYCHIATRIC:  Normal affect   EKG:  EKG is not ordered today.    Diagnoses:    1. Coronary artery disease involving native coronary artery of native heart without angina pectoris   2. Hyperlipidemia LDL goal <70   3. Essential hypertension    Assessment and Plan:     CAD without angina: History of remote angioplasty in outside facility. Coronary CTA 02/2019 in the setting of EKG changes  revealed significant stenosis in OM1 with flow reduction, recommendation for medical management due to small size of vessel. He denies chest pain, dyspnea, or other symptoms concerning for angina.  No indication for further ischemic evaluation at this time.  He has not taken any sublingual nitroglycerin. Recently ran out of his medications.  Agrees that he is resuming GDMT including amlodipine, aspirin, atorvastatin, empagliflozin, losartan.  Hypertension: BP is well-controlled.  He does not monitor on a consistent basis.  No concerns from other providers during recent visits. Medication refills have been completed by PCP. Kidney function is stable on recent lab work. Continue amlodipine, losartan.  Hyperlipidemia LDL goal < 70: LDL 158 10/25/21.  He had run out of atorvastatin but has a new Rx that he will pick up today.  Emphasized the importance of well-controlled LDL in the setting of CAD.  Recommend recheck at next office visit.      Disposition: 6 months with Dr. 10/27/21  Medication Adjustments/Labs and Tests Ordered: Current medicines are reviewed at length with the patient today.  Concerns regarding medicines are outlined above.  Orders Placed This Encounter  Procedures   EKG 12-Lead   No orders of the defined types were placed in this encounter.   Patient Instructions  Medication Instructions:   Your physician recommends that you continue on your current medications as directed. Please refer to the Current Medication list given to you today.   *If you need a refill on your cardiac medications before your next appointment, please call your pharmacy*   Lab Work:  None ordered.  If you have labs (blood work) drawn today and your tests are completely normal, you will receive your results only by: MyChart Message (if you have MyChart) OR A paper copy in the mail If you have any lab test that is abnormal or we need to change your treatment, we will call you to review the  results.   Testing/Procedures:  None ordered.  Follow-Up: At [facility], you and your health needs are our priority.  As part of our continuing mission to provide you with exceptional heart care, we have created designated Provider Care Teams.  These Care Teams include your primary Cardiologist (physician) and Advanced Practice Providers (APPs -  Physician Assistants and Nurse Practitioners) who all work together to provide you with the care you need, when you need it.  We recommend signing up for the patient portal called "MyChart".  Sign up information is provided on this After Visit Summary.  MyChart is used to connect with patients for Virtual Visits (Telemedicine).  Patients are able to view lab/test results, encounter notes, upcoming appointments, etc.  Non-urgent messages can be sent to your provider as well.   To learn more about what you can do with MyChart, go to [email or web address].    Your next appointment:   6 month(s)  The format for your next appointment:   In Person  Provider:   Lance Muss, MD     Other Instructions  Your physician wants you to follow-up in: 6 month with Dr. Eldridge Dace.  You will receive a reminder letter in the mail two months in advance. If you don't receive a letter, please call our office to schedule the follow-up appointment.   Important Information About Sugar         Signed, [name], NP  11/29/2021 6:49 AM    [facility]

## 2021-11-27 NOTE — Therapy (Addendum)
OUTPATIENT PHYSICAL THERAPY CERVICAL EVALUATION/Discharge PHYSICAL THERAPY DISCHARGE SUMMARY  Visits from Start of Care: 1  Current functional level related to goals / functional outcomes: See below   Remaining deficits: See below   Education / Equipment: HEP  Plan:  Patient goals were not met. Patient is being discharged due to not returning since last visit >30 days Elsie Ra, PT, DPT 04/24/22 10:45 AM       Patient Name: Scott Tucker MRN: [id number] DOB:03/02/1966, 56 y.o., male Today's Date: 11/27/2021   PT End of Session - 11/27/21 0850     Visit Number 1    Number of Visits 6    Date for PT Re-Evaluation 01/08/22    PT Start Time 0805    PT Stop Time 0845    PT Time Calculation (min) 40 min    Activity Tolerance Patient tolerated treatment well    Behavior During Therapy [facility] for tasks assessed/performed             Past Medical History:  Diagnosis Date   Anginal pain ([place])    Anxiety    Chronic back pain    Coronary artery disease    Diabetes mellitus without complication ([place])    type 2   Dyspnea    Headache    HTN (hypertension)    Neuromuscular disorder ([place])    Past Surgical History:  Procedure Laterality Date   ANTERIOR CERVICAL DECOMP/DISCECTOMY FUSION N/A 12/04/2020   Procedure: ANTERIOR CERVICAL DISCECTOMY FUSION C3-4, C4-5, ALLOGRAFT, PLATE;  Surgeon: Marybelle Killings, MD;  Location: [facility];  Service: Orthopedics;  Laterality: N/A;  needs [place] and 2003(dr kritzer)   CARDIAC CATHETERIZATION     SHOULDER SURGERY Right    2017   TONSILLECTOMY     removed as a child   Patient Active Problem List   Diagnosis Date Noted   Spinal stenosis of lumbar region 05/30/2021   S/P cervical spinal fusion 12/18/2020   Chronic radicular cervical pain 05/01/2020   Foraminal stenosis of cervical region (right) 05/01/2020   Chronic pain syndrome 05/01/2020   History of lumbar fusion 05/01/2020   HTN (hypertension)     Diabetes mellitus without complication (HCC)    Chronic back pain    Coronary artery disease    CAD (coronary artery disease) 03/22/2019   Hypertension 02/23/2019   Hyperlipidemia 02/23/2019   Chest pain 02/22/2019    PCP: Kerin Perna, NP  REFERRING PROVIDER: Lanae Crumbly, PA-C  REFERRING DIAG: M54.2 (ICD-10-CM) - Neck pain on left side  THERAPY DIAG:  Cervicalgia  Rationale for Evaluation and Treatment Rehabilitation  ONSET DATE: chronic pain more than a year ever since his neck fusion  SUBJECTIVE:  SUBJECTIVE STATEMENT: He relays the pain in his neck is bad and really never went away after his fusion. He says everything hurts the pain and nothing gives him relief but he has never tried DN. He relays he is not sleeping due to the pain.  PERTINENT HISTORY:  cervical fusion C3-5 Rt shoulder scope with DCR 2018, CAD, HTN,CAD,DM, having lumbar fusion 12/04/21  PAIN:  Are you having pain? Yes: NPRS scale: 9/10 Pain location: Right side of neck and into upper trpas Pain description: stabbing and burning Aggravating factors: anything Relieving factors: nothing  PRECAUTIONS: previous neck fusion  WEIGHT BEARING RESTRICTIONS No  FALLS:  Has patient fallen in last 6 months? No   OCCUPATION: none  PLOF:  needs help with ADLS he reports  PATIENT GOALS : feel better  OBJECTIVE:   DIAGNOSTIC FINDINGS:  See chart  PATIENT SURVEYS:  FOTO 44% functional score at eval   COGNITION: Overall cognitive status: Within functional limits for tasks assessed   SENSATION: WFL  POSTURE: rounded shoulders and forward head  PALPATION: TTP cervical paraspinals and upper trap left worse than Rt   CERVICAL ROM:   AROM A/PROM (deg) eval  Flexion 25  Extension 25  Right  lateral flexion 12  Left lateral flexion 10  Right rotation 40  Left rotation 45   (Blank rows = not tested)  UPPER EXTREMITY stregth  MMT Right eval Left eval  Shoulder flexion 4 4  Shoulder extension    Shoulder abduction 4 4  Shoulder adduction    Shoulder extension    Shoulder internal rotation 4 4  Shoulder external rotation 4 4  Elbow flexion 4+ 4+  Elbow extension 4+ 4+  Wrist flexion    Wrist extension    Wrist ulnar deviation    Wrist radial deviation    Wrist pronation    Wrist supination     (Blank rows = not tested)  UPPER EXTREMITY ROM:  ROM Right eval Left eval  Shoulder flexion 135 135  Shoulder extension    Shoulder abduction 130 130  Shoulder adduction    Shoulder extension    Shoulder internal rotation [facility] WFL  Shoulder external rotation [facility] WFL  Middle trapezius    Lower trapezius    Elbow flexion    Elbow extension    Wrist flexion    Wrist extension    Wrist ulnar deviation    Wrist radial deviation    Wrist pronation    Wrist supination    Grip strength     (Blank rows = not tested)  CERVICAL SPECIAL TESTS:     FUNCTIONAL TESTS:    TODAY'S TREATMENT:  HEP review Manual therapy for skilled palpation and Trigger Point Dry-Needling  Treatment instructions: Expect mild to moderate muscle soreness. Patient Consent Given: Yes Education handout provided: Yes Muscles treated: Cervical,paraspinals and multifidi, upper traps, splenius capitus Treatment response/outcome: fair to good overall tolerance,twitch response noted     PATIENT EDUCATION:  Education details: HEP, PT plan of care, DN Person educated: Patient and Spouse Education method: Explanation, Demonstration, Verbal cues, and Handouts Education comprehension: verbalized understanding, returned demonstration, and needs further education   HOME EXERCISE PROGRAM: Access Code: [email or web address] URL: [email or web address] Date: 11/27/2021 Prepared by: Elsie Ra  Exercises - Seated Assisted Cervical Rotation with Towel  - 3 x daily - 6 x weekly - 1 sets - 10 reps - 5 hold - Seated Passive Cervical Retraction  - 3 x daily - 6 x weekly - 1-2  sets - 10 reps - Seated Cervical Sidebending Stretch  - 3 x daily - 6 x weekly - 1 sets - 2-3 reps - 30 sec hold  ASSESSMENT:  CLINICAL IMPRESSION: Patient referred to PT for Neck pain and left trapezius spasm. He has revious cervical fusion C3-5 from a year agon and MD wants to see if dry needling will help. Of note he will have lumbar fusion scheduled for next week so may not be able to return to PT after that unless MD wants him to.  Patient will benefit from skilled PT to address below impairments, limitations and improve overall function.  OBJECTIVE IMPAIRMENTS: decreased activity tolerance, decreased shoulder mobility, decreased ROM, decreased strength, impaired flexibility, impaired UE use, postural dysfunction, and pain.  ACTIVITY LIMITATIONS: reaching, lifting, carry,  cleaning, driving, and or occupation  PERSONAL FACTORS:  cervical fusion C3-5 Rt shoulder scope with DCR 2018, CAD, HTN,CAD,DM, having lumbar fusion 12/04/21 are also affecting patient's functional outcome.  REHAB POTENTIAL: fair  CLINICAL DECISION MAKING: Stable/uncomplicated  EVALUATION COMPLEXITY: Low    GOALS: Short term PT Goals Target date: 12/25/2021 Pt will be I and compliant with HEP. Baseline:  Goal status: New Pt will decrease pain by 25% overall Baseline: 9 Goal status: New  Long term PT goals Target date: 01/08/2022 Pt will improve neck AROM to [facility] Baseline: Goal status: New Pt will improve UE strength to at least 4+/5 MMT to improve functional strength Baseline: Goal status: New Pt will improve FOTO to at least 56% functional to show improved function Baseline: Goal status: New Pt will reduce pain to overall less than 50% Baseline: 9 Goal status: New  PLAN: PT FREQUENCY: 1-2 times per week   PT  DURATION:6 weeks  PLANNED INTERVENTIONS (unless contraindicated): aquatic PT, Canalith repositioning, cryotherapy, Electrical stimulation, Iontophoresis with 4 mg/ml dexamethasome, Moist heat, traction, Ultrasound, gait training, Therapeutic exercise, balance training, neuromuscular re-education, patient/family education, prosthetic training, manual techniques, passive ROM, dry needling, taping, vasopnuematic device, vestibular, spinal manipulations, joint manipulations  PLAN FOR NEXT SESSION: review HEP, how was DN and repeat if desired    Debbe Odea, PT,DPT 11/27/2021, 8:53 AM

## 2021-11-28 ENCOUNTER — Encounter: Payer: Self-pay | Admitting: Nurse Practitioner

## 2021-11-28 ENCOUNTER — Encounter (HOSPITAL_COMMUNITY)
Admission: RE | Admit: 2021-11-28 | Discharge: 2021-11-28 | Disposition: A | Discharge status: Home / Self Care | Payer: Medicare Other | Source: Ambulatory Visit | Attending: Specialist | Admitting: Specialist

## 2021-11-28 ENCOUNTER — Ambulatory Visit (INDEPENDENT_AMBULATORY_CARE_PROVIDER_SITE_OTHER): Payer: Medicare Other | Admitting: Nurse Practitioner

## 2021-11-28 ENCOUNTER — Other Ambulatory Visit: Payer: Self-pay

## 2021-11-28 ENCOUNTER — Encounter (HOSPITAL_COMMUNITY): Payer: Self-pay

## 2021-11-28 VITALS — BP 132/82 | HR 67 | Temp 97.7°F | Resp 17 | Ht 66.0 in | Wt 190.0 lb

## 2021-11-28 VITALS — BP 120/76 | HR 78 | Ht 66.0 in | Wt 190.4 lb

## 2021-11-28 DIAGNOSIS — I2510 Atherosclerotic heart disease of native coronary artery without angina pectoris: Secondary | ICD-10-CM

## 2021-11-28 DIAGNOSIS — E119 Type 2 diabetes mellitus without complications: Secondary | ICD-10-CM | POA: Insufficient documentation

## 2021-11-28 DIAGNOSIS — E785 Hyperlipidemia, unspecified: Secondary | ICD-10-CM | POA: Diagnosis not present

## 2021-11-28 DIAGNOSIS — Z01818 Encounter for other preprocedural examination: Secondary | ICD-10-CM | POA: Diagnosis not present

## 2021-11-28 DIAGNOSIS — I10 Essential (primary) hypertension: Secondary | ICD-10-CM | POA: Diagnosis not present

## 2021-11-28 HISTORY — DX: Unspecified osteoarthritis, unspecified site: M19.90

## 2021-11-28 LAB — BASIC METABOLIC PANEL
Anion gap: 10 (ref 5–15)
BUN: 6 mg/dL (ref 6–20)
CO2: 25 mmol/L (ref 22–32)
Calcium: 9.3 mg/dL (ref 8.9–10.3)
Chloride: 105 mmol/L (ref 98–111)
Creatinine, Ser: 0.67 mg/dL (ref 0.61–1.24)
GFR, Estimated: >60 mL/min (ref >60)
Glucose, Bld: 142 mg/dL — ABNORMAL HIGH (ref 70–99)
Potassium: 3.7 mmol/L (ref 3.5–5.1)
Sodium: 140 mmol/L (ref 135–145)

## 2021-11-28 LAB — GLUCOSE, CAPILLARY: Glucose-Capillary: 150 mg/dL — ABNORMAL HIGH (ref 70–99)

## 2021-11-28 LAB — SURGICAL PCR SCREEN
MRSA, PCR: NEGATIVE
Staphylococcus aureus: POSITIVE — AB

## 2021-11-28 LAB — CBC
HCT: 43.9 % (ref 39.0–52.0)
Hemoglobin: 15.3 g/dL (ref 13.0–17.0)
MCH: 32.2 pg (ref 26.0–34.0)
MCHC: 34.9 g/dL (ref 30.0–36.0)
MCV: 92.4 fL (ref 80.0–100.0)
Platelets: 344 10*3/uL (ref 150–400)
RBC: 4.75 MIL/uL (ref 4.22–5.81)
RDW: 13.6 % (ref 11.5–15.5)
WBC: 8.2 10*3/uL (ref 4.0–10.5)
nRBC: 0 % (ref 0.0–0.2)

## 2021-11-28 LAB — TYPE AND SCREEN
ABO/RH(D): B POS
Antibody Screen: NEGATIVE

## 2021-11-28 NOTE — Patient Instructions (Signed)
Medication Instructions:   Your physician recommends that you continue on your current medications as directed. Please refer to the Current Medication list given to you today.   *If you need a refill on your cardiac medications before your next appointment, please call your pharmacy*   Lab Work:  None ordered.  If you have labs (blood work) drawn today and your tests are completely normal, you will receive your results only by: MyChart Message (if you have MyChart) OR A paper copy in the mail If you have any lab test that is abnormal or we need to change your treatment, we will call you to review the results.   Testing/Procedures:  None ordered.   Follow-Up: At [facility], [place], you and your health needs are our priority.  As part of our continuing mission to provide you with exceptional heart care, we have created designated Provider Care Teams.  These Care Teams include your primary Cardiologist (physician) and Advanced Practice Providers (APPs -  Physician Assistants and Nurse Practitioners) who all work together to provide you with the care you need, when you need it.  We recommend signing up for the patient portal called "MyChart".  Sign up information is provided on this After Visit Summary.  MyChart is used to connect with patients for Virtual Visits (Telemedicine).  Patients are able to view lab/test results, encounter notes, upcoming appointments, etc.  Non-urgent messages can be sent to your provider as well.   To learn more about what you can do with MyChart, go to [email or web address].    Your next appointment:   6 month(s)  The format for your next appointment:   In Person  Provider:   Lance Muss, MD     Other Instructions  Your physician wants you to follow-up in: 6 month with Dr. Eldridge Dace.  You will receive a reminder letter in the mail two months in advance. If you don't receive a letter, please call our office to schedule the follow-up  appointment.   Important Information About Sugar

## 2021-11-28 NOTE — Progress Notes (Signed)
   11/28/21 0814  OBSTRUCTIVE SLEEP APNEA  Have you ever been diagnosed with sleep apnea through a sleep study? No  Do you snore loudly (loud enough to be heard through closed doors)?  1  Do you often feel tired, fatigued, or sleepy during the daytime (such as falling asleep during driving or talking to someone)? 0  Has anyone observed you stop breathing during your sleep? 1  Do you have, or are you being treated for high blood pressure? 1  BMI more than 35 kg/m2? 0  Age > 50 (1-yes) 1  Male Gender (Yes=1) 1  Obstructive Sleep Apnea Score 5  Score 5 or greater  Results sent to PCP

## 2021-11-28 NOTE — Progress Notes (Signed)
PCP - Gwinda Passe, NP Cardiologist - Pt saw Dr. Eldridge Dace prior to surgery 11/30/20 (CAD). He recommended 1 yr f/u. Pt has not seen him since then.  PPM/ICD - denies   Chest x-ray - 03/13/20 EKG - 11/28/21 Stress Test - 10+ years ago per pt- according to PAT note from last year records were requested from [facility] ECHO - 02/22/19 Cardiac Cath - 2010? According to PAT note from last year records were requested from [facility]  Sleep Study - denies  DM- Type 2 Fasting Blood Sugar - 130-150 Pt says he spot checks CBG if he is feeling symptomatic  Blood Thinner Instructions: n/a Aspirin Instructions: pt instructed to f/u with surgeon for instructions  ERAS Protcol - yes, no drink   COVID TEST- n/a   Anesthesia review: yes, pt has seen cardiology last year (not sure if records that were requested were received).  Patient denies shortness of breath, fever, cough and chest pain at PAT appointment   All instructions explained to the patient, with a verbal understanding of the material. Patient agrees to go over the instructions while at home for a better understanding. The opportunity to ask questions was provided.

## 2021-11-28 NOTE — Progress Notes (Signed)
Spoke with pt's girlfriend Scott Tucker and made sure she was aware that pt has an appt today with cardiology at 1330 and to make sure they are aware of his upcoming surgery. She said she would call and let the pt know right away.

## 2021-11-29 ENCOUNTER — Encounter: Payer: Self-pay | Admitting: Nurse Practitioner

## 2021-11-29 NOTE — Progress Notes (Signed)
Anesthesia Chart Review:  Follows with cardiology for history of CAD (angioplasty in [place] circa 2010), HTN, HLD.  Admitted to [facility] October 2020 with EKG changes including T wave inversions in V5 and V6.  He had a coronary CTA showing obstructive CAD and a small OM vessel.  Positive FFR in that vessel.  Medical management given the size of the vessel.  Troponins negative at that time.  He was last seen by Clair Gulling, NP 11/28/2021.  Noted to be doing well at that time, no chest pain, dyspnea, other symptoms concerning for angina.  No indication for further ischemic evaluation.  Patient had been previously cleared for surgery in telephone encounter 10/24/2021 stating, "Preoperative Cardiovascular Risk Assessment: He is doing well from a cardiac perspective and may proceed with surgery without further testing. According to the Revised Cardiac Risk Index (RCRI), his Perioperative Risk of Major Cardiac Event is (%): 0.4. His Functional Capacity in METs is: 5.07 according to the Duke Activity Status Index (DASI). There is no request for patient to hold aspirin perioperatively. Ideally aspirin should be continued without interruption, however if the bleeding risk is too great, aspirin may be held for 7 days prior to surgery. Please resume aspirin post operatively when it is felt to be safe from a bleeding standpoint."  Of note, CXR 03/13/2020 showed chronic elevation of the right hemidiaphragm.  Non-insulin-dependent DM2, last A1c 7.4 on 10/25/2021.  Preop labs reviewed, unremarkable.  EKG 03/31/2022: Sinus bradycardia.  Rate 59. Left anterior fascicular block. Moderate voltage criteria for LVH, may be normal variant ( R in aVL , Cornell product ). Septal infarct , age undetermined. ST & T wave abnormality, consider inferolateral ischemia.  Since last tracing rate slower.  TTE 02/22/2019:  1. Left ventricular ejection fraction, by visual estimation, is 55 to  60%. The left ventricle has normal  function. Normal left ventricular size.  There is mildly increased left ventricular hypertrophy.   2. Global right ventricle has normal systolic function.The right  ventricular size is normal.   3. Left atrial size was normal.   4. Right atrial size was normal.   5. The mitral valve is normal in structure. Trace mitral valve  regurgitation. No evidence of mitral stenosis.   6. The tricuspid valve is normal in structure. Tricuspid valve  regurgitation is trivial.   7. The aortic valve is tricuspid Aortic valve regurgitation was not  visualized by color flow Doppler. Structurally normal aortic valve, with  no evidence of sclerosis or stenosis.   8. The pulmonic valve was normal in structure. Pulmonic valve  regurgitation is not visualized by color flow Doppler.   9. The inferior vena cava is normal in size with greater than 50%  respiratory variability, suggesting right atrial pressure of 3 mmHg.  10. Normal LV systolic and diastolic function.   Coronary CT 02/22/2019: IMPRESSION: 1. CT FFR analysis shows significant stenosis in OM1 vessel with FFR 0.72. This is a small caliber vessel, recommend medical management but consider PCI if there are refractory symptoms.    Zannie Cove [facility] Short Stay Center/Anesthesiology Phone [phone] 11/29/2021 4:21 PM

## 2021-11-29 NOTE — Anesthesia Preprocedure Evaluation (Deleted)
Anesthesia Evaluation    Airway        Dental   Pulmonary Patient abstained from smoking.,           Cardiovascular hypertension,      Neuro/Psych    GI/Hepatic   Endo/Other  diabetes  Renal/GU      Musculoskeletal   Abdominal   Peds  Hematology   Anesthesia Other Findings   Reproductive/Obstetrics                             Anesthesia Physical Anesthesia Plan  ASA:   Anesthesia Plan:    Post-op Pain Management:    Induction:   PONV Risk Score and Plan:   Airway Management Planned:   Additional Equipment:   Intra-op Plan:   Post-operative Plan:   Informed Consent:   Plan Discussed with:   Anesthesia Plan Comments: (PAT note by Antionette Poles, PA-C: Follows with cardiology for history of CAD (angioplasty in [place] circa 2010), HTN, HLD. Admitted to [facility] October 2020 with EKG changes including T wave inversions in V5 and V6. He had a coronary CTA showing obstructive CAD and a small OM vessel. Positive FFR in that vessel. Medical management given the size of the vessel. Troponins negative at that time.  He was last seen by Clair Gulling, NP 11/28/2021.  Noted to be doing well at that time, no chest pain, dyspnea, other symptoms concerning for angina.  No indication for further ischemic evaluation.  Patient had been previously cleared for surgery in telephone encounter 10/24/2021 stating, "Preoperative Cardiovascular Risk Assessment:He is doing well from a cardiac perspective and may proceed with surgery without further testing. According to the Revised Cardiac Risk Index (RCRI),hisPerioperative Risk of Major Cardiac Event is (%): 0.4.HisFunctional Capacity in METs is: 5.07according to the Duke Activity Status Index (DASI). There is no request for patient to hold aspirin perioperatively.Ideally aspirin should be continued without interruption, however if the bleeding risk is  too great, aspirin may be held for 7 days prior to surgery. Please resume aspirin post operatively when it is felt to be safe from a bleeding standpoint."  Of note, CXR 03/13/2020 showed chronic elevation of the right hemidiaphragm.  Non-insulin-dependent DM2, last A1c 7.4 on 10/25/2021.  Preop labs reviewed, unremarkable.  EKG 03/31/2022: Sinus bradycardia.  Rate 59. Left anterior fascicular block. Moderate voltage criteria for LVH, may be normal variant ( R in aVL , Cornell product ). Septal infarct , age undetermined. ST & T wave abnormality, consider inferolateral ischemia.  Since last tracing rate slower.  TTE 02/22/2019: 1. Left ventricular ejection fraction, by visual estimation, is 55 to  60%. The left ventricle has normal function. Normal left ventricular size.  There is mildly increased left ventricular hypertrophy.  2. Global right ventricle has normal systolic function.The right  ventricular size is normal.  3. Left atrial size was normal.  4. Right atrial size was normal.  5. The mitral valve is normal in structure. Trace mitral valve  regurgitation. No evidence of mitral stenosis.  6. The tricuspid valve is normal in structure. Tricuspid valve  regurgitation is trivial.  7. The aortic valve is tricuspid Aortic valve regurgitation was not  visualized by color flow Doppler. Structurally normal aortic valve, with  no evidence of sclerosis or stenosis.  8. The pulmonic valve was normal in structure. Pulmonic valve  regurgitation is not visualized by color flow Doppler.  9. The inferior vena cava is normal in  size with greater than 50%  respiratory variability, suggesting right atrial pressure of 3 mmHg.  10. Normal LV systolic and diastolic function.   Coronary CT 02/22/2019: IMPRESSION: 1. CT FFR analysis shows significant stenosis in OM1 vessel with FFR 0.72. This is a small caliber vessel, recommend medical management but consider PCI if there are refractory  symptoms.  )        Anesthesia Quick Evaluation

## 2021-12-03 ENCOUNTER — Other Ambulatory Visit: Payer: Self-pay

## 2021-12-03 NOTE — Anesthesia Preprocedure Evaluation (Addendum)
Anesthesia Evaluation  Patient identified by MRN, date of birth, ID band Patient awake    Reviewed: Allergy & Precautions, NPO status , Patient's Chart, lab work & pertinent test results  History of Anesthesia Complications Negative for: history of anesthetic complications  Airway Mallampati: II  TM Distance: >3 FB Neck ROM: Full    Dental  (+) Poor Dentition   Pulmonary Patient abstained from smoking.,    Pulmonary exam normal        Cardiovascular hypertension, Pt. on medications + CAD  Normal cardiovascular exam     Neuro/Psych  Headaches, Anxiety  Neuromuscular disease    GI/Hepatic negative GI ROS, Neg liver ROS,   Endo/Other  diabetes, Type 2, Oral Hypoglycemic Agents  Renal/GU      Musculoskeletal negative musculoskeletal ROS (+)   Abdominal   Peds  Hematology negative hematology ROS (+)   Anesthesia Other Findings   Reproductive/Obstetrics                           Anesthesia Physical  Anesthesia Plan  ASA: 2  Anesthesia Plan: General   Post-op Pain Management: Tylenol PO (pre-op)*, Celebrex PO (pre-op)* and Ketamine IV*   Induction: Intravenous  PONV Risk Score and Plan: 3 and Ondansetron, Dexamethasone and Midazolam  Airway Management Planned: Oral ETT  Additional Equipment: None  Intra-op Plan:   Post-operative Plan: Extubation in OR  Informed Consent: I have reviewed the patients History and Physical, chart, labs and discussed the procedure including the risks, benefits and alternatives for the proposed anesthesia with the patient or authorized representative who has indicated his/her understanding and acceptance.     Dental advisory given  Plan Discussed with: Anesthesiologist and CRNA  Anesthesia Plan Comments: (PAT note by Antionette Poles, PA-C: Follows with cardiology for history of CAD s/p angioplasty approximately 2010 in [place], [place]. He  was admitted to [facility] in October 2020. He had some ECG changes including T wave inversions in V5 and V6. He had a coronary CTA showing obstructive CAD and a small OM vessel. Positive FFR in that vessel. Medical management done given the size of the vessel. Troponins negative at that time.  Last seen by Dr. Eldridge Dace 11/30/2020 for preop evaluation.  Per note, "Preoperative cardiovascular exam:No further cardiac testing needed before surgery. He is stable from a cardiac standpoint. Okay to hold aspirin 7 days prior if needed."  CXR 03/13/2020 showed chronic elevation of the right hemidiaphragm.  DM2 well-controlled, last A1c 6.1 on 10/27/2020.  Preop labs reviewed, unremarkable.  EKG 11/30/2020: normal sinus rhythm, nonspecific ST-T wave changes, no significant change from November 2021 ECG  CHEST - 2 VIEW 03/13/2020: COMPARISON: 06/24/2019  FINDINGS: Frontal and lateral views of the chest demonstrate an unremarkable cardiac silhouette. Chronic elevation of the right hemidiaphragm. No airspace disease, effusion, or pneumothorax. No acute bony abnormalities.  IMPRESSION: 1. No acute intrathoracic process.  TTE 02/22/2019: 1. Left ventricular ejection fraction, by visual estimation, is 55 to  60%. The left ventricle has normal function. Normal left ventricular size.  There is mildly increased left ventricular hypertrophy.  2. Global right ventricle has normal systolic function.The right  ventricular size is normal.  3. Left atrial size was normal.  4. Right atrial size was normal.  5. The mitral valve is normal in structure. Trace mitral valve  regurgitation. No evidence of mitral stenosis.  6. The tricuspid valve is normal in structure. Tricuspid valve  regurgitation is trivial.  7.  The aortic valve is tricuspid Aortic valve regurgitation was not  visualized by color flow Doppler. Structurally normal aortic valve, with  no evidence of sclerosis or stenosis.  8. The  pulmonic valve was normal in structure. Pulmonic valve  regurgitation is not visualized by color flow Doppler.  9. The inferior vena cava is normal in size with greater than 50%  respiratory variability, suggesting right atrial pressure of 3 mmHg.  10. Normal LV systolic and diastolic function.   Coronary CT with FFR analysis 02/22/2019: IMPRESSION: 1. CT FFR analysis shows significant stenosis in OM1 vessel with FFR 0.72. This is a small caliber vessel, recommend medical management but consider PCI if there are refractory symptoms.  )      Anesthesia Quick Evaluation

## 2021-12-04 ENCOUNTER — Inpatient Hospital Stay (HOSPITAL_COMMUNITY): Payer: Medicare Other | Admitting: Anesthesiology

## 2021-12-04 ENCOUNTER — Other Ambulatory Visit: Payer: Self-pay

## 2021-12-04 ENCOUNTER — Inpatient Hospital Stay (HOSPITAL_COMMUNITY): Payer: Medicare Other | Admitting: Emergency Medicine

## 2021-12-04 ENCOUNTER — Inpatient Hospital Stay (HOSPITAL_COMMUNITY)
Admission: RE | Admit: 2021-12-04 | Discharge: 2021-12-13 | DRG: 455 | Disposition: A | Discharge status: Rehabilitation Facility | Payer: Medicare Other | Attending: Specialist | Admitting: Specialist

## 2021-12-04 ENCOUNTER — Inpatient Hospital Stay (HOSPITAL_COMMUNITY)
Admission: RE | Disposition: A | Discharge status: Rehabilitation Facility | Payer: Self-pay | Source: Home / Self Care | Attending: Specialist

## 2021-12-04 ENCOUNTER — Encounter (HOSPITAL_COMMUNITY): Payer: Self-pay | Admitting: Specialist

## 2021-12-04 ENCOUNTER — Inpatient Hospital Stay (HOSPITAL_COMMUNITY): Payer: Medicare Other

## 2021-12-04 DIAGNOSIS — D72829 Elevated white blood cell count, unspecified: Secondary | ICD-10-CM | POA: Diagnosis not present

## 2021-12-04 DIAGNOSIS — M4317 Spondylolisthesis, lumbosacral region: Secondary | ICD-10-CM | POA: Diagnosis not present

## 2021-12-04 DIAGNOSIS — F329 Major depressive disorder, single episode, unspecified: Secondary | ICD-10-CM | POA: Diagnosis not present

## 2021-12-04 DIAGNOSIS — M1990 Unspecified osteoarthritis, unspecified site: Secondary | ICD-10-CM | POA: Diagnosis present

## 2021-12-04 DIAGNOSIS — M4316 Spondylolisthesis, lumbar region: Secondary | ICD-10-CM | POA: Diagnosis not present

## 2021-12-04 DIAGNOSIS — M549 Dorsalgia, unspecified: Secondary | ICD-10-CM | POA: Diagnosis present

## 2021-12-04 DIAGNOSIS — E785 Hyperlipidemia, unspecified: Secondary | ICD-10-CM | POA: Diagnosis not present

## 2021-12-04 DIAGNOSIS — Z880 Allergy status to penicillin: Secondary | ICD-10-CM | POA: Diagnosis not present

## 2021-12-04 DIAGNOSIS — M4716 Other spondylosis with myelopathy, lumbar region: Secondary | ICD-10-CM | POA: Diagnosis not present

## 2021-12-04 DIAGNOSIS — M419 Scoliosis, unspecified: Secondary | ICD-10-CM | POA: Diagnosis present

## 2021-12-04 DIAGNOSIS — E119 Type 2 diabetes mellitus without complications: Secondary | ICD-10-CM | POA: Diagnosis present

## 2021-12-04 DIAGNOSIS — I10 Essential (primary) hypertension: Secondary | ICD-10-CM | POA: Diagnosis not present

## 2021-12-04 DIAGNOSIS — M48062 Spinal stenosis, lumbar region with neurogenic claudication: Secondary | ICD-10-CM

## 2021-12-04 DIAGNOSIS — M48061 Spinal stenosis, lumbar region without neurogenic claudication: Secondary | ICD-10-CM | POA: Diagnosis present

## 2021-12-04 DIAGNOSIS — G992 Myelopathy in diseases classified elsewhere: Secondary | ICD-10-CM | POA: Diagnosis not present

## 2021-12-04 DIAGNOSIS — I2510 Atherosclerotic heart disease of native coronary artery without angina pectoris: Secondary | ICD-10-CM | POA: Diagnosis present

## 2021-12-04 DIAGNOSIS — N200 Calculus of kidney: Secondary | ICD-10-CM | POA: Diagnosis present

## 2021-12-04 DIAGNOSIS — R531 Weakness: Secondary | ICD-10-CM | POA: Diagnosis present

## 2021-12-04 DIAGNOSIS — E876 Hypokalemia: Secondary | ICD-10-CM | POA: Diagnosis present

## 2021-12-04 DIAGNOSIS — M5116 Intervertebral disc disorders with radiculopathy, lumbar region: Secondary | ICD-10-CM | POA: Diagnosis not present

## 2021-12-04 DIAGNOSIS — G8929 Other chronic pain: Secondary | ICD-10-CM | POA: Diagnosis not present

## 2021-12-04 DIAGNOSIS — M792 Neuralgia and neuritis, unspecified: Secondary | ICD-10-CM | POA: Diagnosis not present

## 2021-12-04 DIAGNOSIS — D62 Acute posthemorrhagic anemia: Secondary | ICD-10-CM | POA: Diagnosis not present

## 2021-12-04 DIAGNOSIS — L309 Dermatitis, unspecified: Secondary | ICD-10-CM | POA: Diagnosis present

## 2021-12-04 DIAGNOSIS — Z8616 Personal history of COVID-19: Secondary | ICD-10-CM | POA: Diagnosis not present

## 2021-12-04 DIAGNOSIS — F419 Anxiety disorder, unspecified: Secondary | ICD-10-CM | POA: Diagnosis present

## 2021-12-04 DIAGNOSIS — Z8249 Family history of ischemic heart disease and other diseases of the circulatory system: Secondary | ICD-10-CM | POA: Diagnosis not present

## 2021-12-04 DIAGNOSIS — M21372 Foot drop, left foot: Secondary | ICD-10-CM | POA: Diagnosis present

## 2021-12-04 DIAGNOSIS — M5126 Other intervertebral disc displacement, lumbar region: Secondary | ICD-10-CM | POA: Diagnosis not present

## 2021-12-04 DIAGNOSIS — Z981 Arthrodesis status: Secondary | ICD-10-CM | POA: Diagnosis not present

## 2021-12-04 DIAGNOSIS — Z9889 Other specified postprocedural states: Secondary | ICD-10-CM | POA: Diagnosis not present

## 2021-12-04 DIAGNOSIS — Z4789 Encounter for other orthopedic aftercare: Secondary | ICD-10-CM | POA: Diagnosis not present

## 2021-12-04 DIAGNOSIS — E559 Vitamin D deficiency, unspecified: Secondary | ICD-10-CM | POA: Diagnosis not present

## 2021-12-04 DIAGNOSIS — M62838 Other muscle spasm: Secondary | ICD-10-CM | POA: Diagnosis present

## 2021-12-04 DIAGNOSIS — Z7982 Long term (current) use of aspirin: Secondary | ICD-10-CM | POA: Diagnosis not present

## 2021-12-04 DIAGNOSIS — M4326 Fusion of spine, lumbar region: Secondary | ICD-10-CM | POA: Diagnosis not present

## 2021-12-04 DIAGNOSIS — M51369 Other intervertebral disc degeneration, lumbar region without mention of lumbar back pain or lower extremity pain: Secondary | ICD-10-CM | POA: Diagnosis present

## 2021-12-04 DIAGNOSIS — Z79891 Long term (current) use of opiate analgesic: Secondary | ICD-10-CM

## 2021-12-04 DIAGNOSIS — J986 Disorders of diaphragm: Secondary | ICD-10-CM | POA: Diagnosis not present

## 2021-12-04 DIAGNOSIS — M545 Low back pain, unspecified: Secondary | ICD-10-CM | POA: Diagnosis present

## 2021-12-04 DIAGNOSIS — Z7984 Long term (current) use of oral hypoglycemic drugs: Secondary | ICD-10-CM | POA: Diagnosis not present

## 2021-12-04 DIAGNOSIS — M5136 Other intervertebral disc degeneration, lumbar region: Secondary | ICD-10-CM | POA: Diagnosis not present

## 2021-12-04 DIAGNOSIS — L259 Unspecified contact dermatitis, unspecified cause: Secondary | ICD-10-CM | POA: Diagnosis not present

## 2021-12-04 DIAGNOSIS — R109 Unspecified abdominal pain: Secondary | ICD-10-CM | POA: Diagnosis not present

## 2021-12-04 DIAGNOSIS — G894 Chronic pain syndrome: Secondary | ICD-10-CM | POA: Diagnosis present

## 2021-12-04 LAB — GLUCOSE, CAPILLARY
Glucose-Capillary: 158 mg/dL — ABNORMAL HIGH (ref 70–99)
Glucose-Capillary: 132 mg/dL — ABNORMAL HIGH (ref 70–99)
Glucose-Capillary: 201 mg/dL — ABNORMAL HIGH (ref 70–99)
Glucose-Capillary: 178 mg/dL — ABNORMAL HIGH (ref 70–99)
Glucose-Capillary: 237 mg/dL — ABNORMAL HIGH (ref 70–99)
Glucose-Capillary: 208 mg/dL — ABNORMAL HIGH (ref 70–99)

## 2021-12-04 LAB — ABO/RH: ABO/RH(D): B POS

## 2021-12-04 SURGERY — POSTERIOR LUMBAR FUSION 3 WITH HARDWARE REMOVAL
Anesthesia: General | Site: Back

## 2021-12-04 MED ORDER — ACETAMINOPHEN 500 MG PO TABS
1000.0000 mg | ORAL_TABLET | Freq: Once | ORAL | Status: AC
  Administered 2021-12-04: 1000 mg via ORAL

## 2021-12-04 MED ORDER — HYDROCODONE-ACETAMINOPHEN 7.5-325 MG PO TABS
1.0000 | ORAL_TABLET | ORAL | Status: DC | PRN
  Administered 2021-12-06 – 2021-12-11 (×5): 1 via ORAL
  Filled 2021-12-04 (×3): qty 1

## 2021-12-04 MED ORDER — ROCURONIUM BROMIDE 10 MG/ML (PF) SYRINGE
PREFILLED_SYRINGE | INTRAVENOUS | Status: DC | PRN
  Administered 2021-12-04: 20 mg via INTRAVENOUS
  Administered 2021-12-04: 100 mg via INTRAVENOUS
  Administered 2021-12-04: 10 mg via INTRAVENOUS
  Administered 2021-12-04 (×2): 20 mg via INTRAVENOUS

## 2021-12-04 MED ORDER — THROMBIN 20000 UNITS EX SOLR: CUTANEOUS | Status: DC | PRN

## 2021-12-04 MED ORDER — MENTHOL 3 MG MT LOZG
1.0000 | LOZENGE | OROMUCOSAL | Status: DC | PRN
Start: 2021-12-04 — End: 2021-12-13

## 2021-12-04 MED ORDER — PHENYLEPHRINE 80 MCG/ML (10ML) SYRINGE FOR IV PUSH (FOR BLOOD PRESSURE SUPPORT)
PREFILLED_SYRINGE | INTRAVENOUS | Status: AC
  Filled 2021-12-04: qty 10

## 2021-12-04 MED ORDER — INSULIN ASPART 100 UNIT/ML IJ SOLN
0.0000 [IU] | INTRAMUSCULAR | Status: AC | PRN
  Administered 2021-12-04: 4 [IU] via SUBCUTANEOUS

## 2021-12-04 MED ORDER — HYDROMORPHONE HCL 1 MG/ML IJ SOLN
0.2500 mg | INTRAMUSCULAR | Status: DC | PRN
  Administered 2021-12-04 (×4): 0.5 mg via INTRAVENOUS

## 2021-12-04 MED ORDER — ACETAMINOPHEN 500 MG PO TABS
ORAL_TABLET | ORAL | Status: AC
  Filled 2021-12-04: qty 2

## 2021-12-04 MED ORDER — ORAL CARE MOUTH RINSE: 15.0000 mL | Freq: Once | OROMUCOSAL | Status: AC

## 2021-12-04 MED ORDER — FENTANYL CITRATE (PF) 250 MCG/5ML IJ SOLN
INTRAMUSCULAR | Status: AC
  Filled 2021-12-04: qty 5

## 2021-12-04 MED ORDER — CEFAZOLIN SODIUM-DEXTROSE 2-4 GM/100ML-% IV SOLN
2.0000 g | Freq: Three times a day (TID) | INTRAVENOUS | Status: AC
  Administered 2021-12-04 – 2021-12-05 (×2): 2 g via INTRAVENOUS
  Filled 2021-12-04 (×2): qty 100

## 2021-12-04 MED ORDER — INSULIN ASPART 100 UNIT/ML IJ SOLN
INTRAMUSCULAR | Status: AC
  Administered 2021-12-04: 2 [IU] via SUBCUTANEOUS
  Filled 2021-12-04: qty 1

## 2021-12-04 MED ORDER — KETAMINE HCL 10 MG/ML IJ SOLN
INTRAMUSCULAR | Status: DC | PRN
  Administered 2021-12-04: 40 mg via INTRAVENOUS
  Administered 2021-12-04 (×6): 10 mg via INTRAVENOUS

## 2021-12-04 MED ORDER — LOSARTAN POTASSIUM 50 MG PO TABS
25.0000 mg | ORAL_TABLET | Freq: Every day | ORAL | Status: DC
  Administered 2021-12-04 – 2021-12-13 (×10): 25 mg via ORAL
  Filled 2021-12-04 (×10): qty 1

## 2021-12-04 MED ORDER — MORPHINE SULFATE (PF) 2 MG/ML IV SOLN
1.0000 mg | INTRAVENOUS | Status: DC | PRN
  Administered 2021-12-04 – 2021-12-10 (×4): 1 mg via INTRAVENOUS
  Filled 2021-12-04 (×4): qty 1

## 2021-12-04 MED ORDER — HYDROCODONE-ACETAMINOPHEN 7.5-325 MG PO TABS
2.0000 | ORAL_TABLET | ORAL | Status: DC | PRN
  Administered 2021-12-05 – 2021-12-13 (×21): 2 via ORAL
  Filled 2021-12-04 (×23): qty 2

## 2021-12-04 MED ORDER — VITAMIN D (ERGOCALCIFEROL) 1.25 MG (50000 UNIT) PO CAPS
50000.0000 [IU] | ORAL_CAPSULE | ORAL | Status: DC
  Administered 2021-12-05 – 2021-12-12 (×2): 50000 [IU] via ORAL
  Filled 2021-12-04 (×2): qty 1

## 2021-12-04 MED ORDER — INSULIN ASPART 100 UNIT/ML IJ SOLN
4.0000 [IU] | Freq: Three times a day (TID) | INTRAMUSCULAR | Status: DC
Start: 2021-12-04 — End: 2021-12-13
  Administered 2021-12-04 – 2021-12-13 (×21): 4 [IU] via SUBCUTANEOUS

## 2021-12-04 MED ORDER — BUPIVACAINE LIPOSOME 1.3 % IJ SUSP
INTRAMUSCULAR | Status: DC | PRN
  Administered 2021-12-04: 5 mL
  Administered 2021-12-04: 10 mL

## 2021-12-04 MED ORDER — SURGIFLO WITH THROMBIN (HEMOSTATIC MATRIX KIT) OPTIME: TOPICAL | Status: DC | PRN

## 2021-12-04 MED ORDER — DOCUSATE SODIUM 100 MG PO CAPS
100.0000 mg | ORAL_CAPSULE | Freq: Two times a day (BID) | ORAL | Status: DC
  Administered 2021-12-04 – 2021-12-12 (×14): 100 mg via ORAL
  Filled 2021-12-04 (×18): qty 1

## 2021-12-04 MED ORDER — CELECOXIB 200 MG PO CAPS
200.0000 mg | ORAL_CAPSULE | Freq: Once | ORAL | Status: AC
  Administered 2021-12-04: 200 mg via ORAL

## 2021-12-04 MED ORDER — SODIUM CHLORIDE 0.9 % IV SOLN: 250.0000 mL | INTRAVENOUS | Status: DC

## 2021-12-04 MED ORDER — ONDANSETRON HCL 4 MG/2ML IJ SOLN
4.0000 mg | Freq: Four times a day (QID) | INTRAMUSCULAR | Status: DC | PRN
  Administered 2021-12-04 – 2021-12-06 (×2): 4 mg via INTRAVENOUS
  Filled 2021-12-04: qty 2

## 2021-12-04 MED ORDER — METHOCARBAMOL 500 MG PO TABS
500.0000 mg | ORAL_TABLET | Freq: Four times a day (QID) | ORAL | Status: DC | PRN
  Administered 2021-12-04 – 2021-12-13 (×15): 500 mg via ORAL
  Filled 2021-12-04 (×15): qty 1

## 2021-12-04 MED ORDER — SODIUM CHLORIDE 0.9% FLUSH
3.0000 mL | INTRAVENOUS | Status: DC | PRN
Start: 2021-12-04 — End: 2021-12-13

## 2021-12-04 MED ORDER — HYDROMORPHONE HCL 1 MG/ML IJ SOLN
INTRAMUSCULAR | Status: AC
  Filled 2021-12-04: qty 1

## 2021-12-04 MED ORDER — PHENYLEPHRINE HCL-NACL 20-0.9 MG/250ML-% IV SOLN
INTRAVENOUS | Status: DC | PRN
  Administered 2021-12-04: 20 ug/min via INTRAVENOUS

## 2021-12-04 MED ORDER — PANTOPRAZOLE SODIUM 40 MG IV SOLR
40.0000 mg | Freq: Every day | INTRAVENOUS | Status: DC
  Administered 2021-12-04 – 2021-12-06 (×3): 40 mg via INTRAVENOUS
  Filled 2021-12-04 (×3): qty 10

## 2021-12-04 MED ORDER — THROMBIN 20000 UNITS EX KIT
PACK | CUTANEOUS | Status: AC
  Filled 2021-12-04: qty 1

## 2021-12-04 MED ORDER — BUPIVACAINE LIPOSOME 1.3 % IJ SUSP
INTRAMUSCULAR | Status: AC
Start: 2021-12-04 — End: ?
  Filled 2021-12-04: qty 20

## 2021-12-04 MED ORDER — KETAMINE HCL 50 MG/5ML IJ SOSY
PREFILLED_SYRINGE | INTRAMUSCULAR | Status: AC
  Filled 2021-12-04: qty 10

## 2021-12-04 MED ORDER — FLEET ENEMA 7-19 GM/118ML RE ENEM: 1.0000 | ENEMA | Freq: Once | RECTAL | Status: DC | PRN

## 2021-12-04 MED ORDER — CHLORHEXIDINE GLUCONATE 0.12 % MT SOLN
OROMUCOSAL | Status: AC
  Administered 2021-12-04: 15 mL via OROMUCOSAL
  Filled 2021-12-04: qty 15

## 2021-12-04 MED ORDER — HYDROCODONE-ACETAMINOPHEN 7.5-325 MG PO TABS
1.0000 | ORAL_TABLET | Freq: Four times a day (QID) | ORAL | Status: DC
  Administered 2021-12-04 – 2021-12-11 (×23): 1 via ORAL
  Filled 2021-12-04 (×24): qty 1

## 2021-12-04 MED ORDER — FENTANYL CITRATE (PF) 250 MCG/5ML IJ SOLN
INTRAMUSCULAR | Status: DC | PRN
  Administered 2021-12-04 (×7): 50 ug via INTRAVENOUS
  Administered 2021-12-04: 100 ug via INTRAVENOUS
  Administered 2021-12-04: 50 ug via INTRAVENOUS

## 2021-12-04 MED ORDER — MIDAZOLAM HCL 2 MG/2ML IJ SOLN
INTRAMUSCULAR | Status: AC
  Filled 2021-12-04: qty 2

## 2021-12-04 MED ORDER — VANCOMYCIN HCL 1000 MG IV SOLR
INTRAVENOUS | Status: AC
  Filled 2021-12-04: qty 20

## 2021-12-04 MED ORDER — METHOCARBAMOL 1000 MG/10ML IJ SOLN: 500.0000 mg | Freq: Four times a day (QID) | INTRAVENOUS | Status: DC | PRN

## 2021-12-04 MED ORDER — ACETAMINOPHEN 325 MG PO TABS
650.0000 mg | ORAL_TABLET | ORAL | Status: DC | PRN
  Administered 2021-12-09: 650 mg via ORAL
  Filled 2021-12-04: qty 2

## 2021-12-04 MED ORDER — LORATADINE 10 MG PO TABS
10.0000 mg | ORAL_TABLET | Freq: Every day | ORAL | Status: DC
  Administered 2021-12-04 – 2021-12-13 (×10): 10 mg via ORAL
  Filled 2021-12-04 (×10): qty 1

## 2021-12-04 MED ORDER — ASPIRIN 81 MG PO TBEC
81.0000 mg | DELAYED_RELEASE_TABLET | Freq: Every day | ORAL | Status: DC
  Administered 2021-12-04 – 2021-12-13 (×10): 81 mg via ORAL
  Filled 2021-12-04 (×10): qty 1

## 2021-12-04 MED ORDER — SUGAMMADEX SODIUM 200 MG/2ML IV SOLN
INTRAVENOUS | Status: DC | PRN
  Administered 2021-12-04: 400 mg via INTRAVENOUS

## 2021-12-04 MED ORDER — BUPIVACAINE LIPOSOME 1.3 % IJ SUSP
10.0000 mL | Freq: Once | INTRAMUSCULAR | Status: DC
  Filled 2021-12-04: qty 10

## 2021-12-04 MED ORDER — ATORVASTATIN CALCIUM 40 MG PO TABS
40.0000 mg | ORAL_TABLET | Freq: Every day | ORAL | Status: DC
  Administered 2021-12-04 – 2021-12-13 (×10): 40 mg via ORAL
  Filled 2021-12-04 (×10): qty 1

## 2021-12-04 MED ORDER — LIDOCAINE 2% (20 MG/ML) 5 ML SYRINGE
INTRAMUSCULAR | Status: DC | PRN
  Administered 2021-12-04: 100 mg via INTRAVENOUS

## 2021-12-04 MED ORDER — CHLORHEXIDINE GLUCONATE 0.12 % MT SOLN: 15.0000 mL | Freq: Once | OROMUCOSAL | Status: AC

## 2021-12-04 MED ORDER — HYDROMORPHONE HCL 1 MG/ML IJ SOLN
0.5000 mg | INTRAMUSCULAR | Status: AC
  Administered 2021-12-04 (×2): 0.5 mg via INTRAVENOUS

## 2021-12-04 MED ORDER — LIDOCAINE 2% (20 MG/ML) 5 ML SYRINGE
INTRAMUSCULAR | Status: AC
  Filled 2021-12-04: qty 10

## 2021-12-04 MED ORDER — DEXAMETHASONE SODIUM PHOSPHATE 10 MG/ML IJ SOLN
INTRAMUSCULAR | Status: AC
  Filled 2021-12-04: qty 2

## 2021-12-04 MED ORDER — DEXAMETHASONE SODIUM PHOSPHATE 10 MG/ML IJ SOLN
INTRAMUSCULAR | Status: DC | PRN
  Administered 2021-12-04: 10 mg via INTRAVENOUS

## 2021-12-04 MED ORDER — 0.9 % SODIUM CHLORIDE (POUR BTL) OPTIME
TOPICAL | Status: DC | PRN
  Administered 2021-12-04 (×2): 1000 mL

## 2021-12-04 MED ORDER — ALUM & MAG HYDROXIDE-SIMETH 200-200-20 MG/5ML PO SUSP: 30.0000 mL | Freq: Four times a day (QID) | ORAL | Status: DC | PRN

## 2021-12-04 MED ORDER — ONDANSETRON HCL 4 MG/2ML IJ SOLN
INTRAMUSCULAR | Status: DC | PRN
  Administered 2021-12-04: 4 mg via INTRAVENOUS

## 2021-12-04 MED ORDER — HYDROMORPHONE HCL 1 MG/ML IJ SOLN
INTRAMUSCULAR | Status: AC
  Filled 2021-12-04: qty 2

## 2021-12-04 MED ORDER — ONDANSETRON HCL 4 MG PO TABS: 4.0000 mg | ORAL_TABLET | Freq: Four times a day (QID) | ORAL | Status: DC | PRN

## 2021-12-04 MED ORDER — GLYCOPYRROLATE 0.2 MG/ML IJ SOLN
INTRAMUSCULAR | Status: DC | PRN
  Administered 2021-12-04: .1 mg via INTRAVENOUS

## 2021-12-04 MED ORDER — ONDANSETRON HCL 4 MG/2ML IJ SOLN
INTRAMUSCULAR | Status: AC
  Filled 2021-12-04: qty 4

## 2021-12-04 MED ORDER — PROPOFOL 10 MG/ML IV BOLUS
INTRAVENOUS | Status: AC
  Filled 2021-12-04: qty 20

## 2021-12-04 MED ORDER — BUPIVACAINE HCL (PF) 0.5 % IJ SOLN
INTRAMUSCULAR | Status: AC
  Filled 2021-12-04: qty 30

## 2021-12-04 MED ORDER — SODIUM CHLORIDE 0.9 % IV SOLN: INTRAVENOUS | Status: DC

## 2021-12-04 MED ORDER — LACTATED RINGERS IV SOLN: INTRAVENOUS | Status: DC

## 2021-12-04 MED ORDER — POLYETHYLENE GLYCOL 3350 17 G PO PACK
17.0000 g | PACK | Freq: Every day | ORAL | Status: DC | PRN
  Administered 2021-12-07: 17 g via ORAL
  Filled 2021-12-04: qty 1

## 2021-12-04 MED ORDER — CEFAZOLIN SODIUM-DEXTROSE 2-4 GM/100ML-% IV SOLN
2.0000 g | INTRAVENOUS | Status: AC
  Administered 2021-12-04 (×2): 2 g via INTRAVENOUS
  Filled 2021-12-04: qty 100

## 2021-12-04 MED ORDER — TRAZODONE HCL 50 MG PO TABS
25.0000 mg | ORAL_TABLET | Freq: Every evening | ORAL | Status: DC | PRN
  Administered 2021-12-04 – 2021-12-11 (×8): 50 mg via ORAL
  Filled 2021-12-04 (×8): qty 1

## 2021-12-04 MED ORDER — NITROGLYCERIN 0.4 MG SL SUBL: 0.4000 mg | SUBLINGUAL_TABLET | SUBLINGUAL | Status: DC | PRN

## 2021-12-04 MED ORDER — ROCURONIUM BROMIDE 10 MG/ML (PF) SYRINGE
PREFILLED_SYRINGE | INTRAVENOUS | Status: AC
  Filled 2021-12-04: qty 20

## 2021-12-04 MED ORDER — ACETAMINOPHEN 650 MG RE SUPP: 650.0000 mg | RECTAL | Status: DC | PRN

## 2021-12-04 MED ORDER — CELECOXIB 200 MG PO CAPS
ORAL_CAPSULE | ORAL | Status: AC
  Filled 2021-12-04: qty 1

## 2021-12-04 MED ORDER — GLYCOPYRROLATE PF 0.2 MG/ML IJ SOSY
PREFILLED_SYRINGE | INTRAMUSCULAR | Status: AC
  Filled 2021-12-04: qty 1

## 2021-12-04 MED ORDER — VANCOMYCIN HCL 1000 MG IV SOLR
INTRAVENOUS | Status: DC | PRN
  Administered 2021-12-04: 1000 mg via TOPICAL

## 2021-12-04 MED ORDER — AMLODIPINE BESYLATE 10 MG PO TABS
10.0000 mg | ORAL_TABLET | Freq: Every day | ORAL | Status: DC
  Administered 2021-12-04 – 2021-12-13 (×10): 10 mg via ORAL
  Filled 2021-12-04 (×10): qty 1

## 2021-12-04 MED ORDER — MIDAZOLAM HCL 2 MG/2ML IJ SOLN
INTRAMUSCULAR | Status: DC | PRN
  Administered 2021-12-04: 2 mg via INTRAVENOUS

## 2021-12-04 MED ORDER — PROPOFOL 10 MG/ML IV BOLUS
INTRAVENOUS | Status: DC | PRN
  Administered 2021-12-04: 150 mg via INTRAVENOUS
  Administered 2021-12-04: 50 mg via INTRAVENOUS

## 2021-12-04 MED ORDER — PHENOL 1.4 % MT LIQD: 1.0000 | OROMUCOSAL | Status: DC | PRN

## 2021-12-04 MED ORDER — BUPIVACAINE HCL 0.5 % IJ SOLN
INTRAMUSCULAR | Status: DC | PRN
  Administered 2021-12-04: 5 mL
  Administered 2021-12-04: 10 mL

## 2021-12-04 MED ORDER — INSULIN ASPART 100 UNIT/ML IJ SOLN
0.0000 [IU] | Freq: Three times a day (TID) | INTRAMUSCULAR | Status: DC
  Administered 2021-12-04: 5 [IU] via SUBCUTANEOUS
  Administered 2021-12-05: 2 [IU] via SUBCUTANEOUS
  Administered 2021-12-05: 3 [IU] via SUBCUTANEOUS
  Administered 2021-12-05: 5 [IU] via SUBCUTANEOUS
  Administered 2021-12-06 – 2021-12-08 (×3): 2 [IU] via SUBCUTANEOUS
  Administered 2021-12-09 – 2021-12-10 (×3): 3 [IU] via SUBCUTANEOUS
  Administered 2021-12-11: 2 [IU] via SUBCUTANEOUS
  Administered 2021-12-12 (×2): 3 [IU] via SUBCUTANEOUS
  Administered 2021-12-13 (×2): 2 [IU] via SUBCUTANEOUS

## 2021-12-04 MED ORDER — SODIUM CHLORIDE 0.9% FLUSH
3.0000 mL | Freq: Two times a day (BID) | INTRAVENOUS | Status: DC
  Administered 2021-12-04 – 2021-12-13 (×17): 3 mL via INTRAVENOUS

## 2021-12-04 MED ORDER — BISACODYL 5 MG PO TBEC
5.0000 mg | DELAYED_RELEASE_TABLET | Freq: Every day | ORAL | Status: DC | PRN
  Administered 2021-12-07: 5 mg via ORAL
  Filled 2021-12-04: qty 1

## 2021-12-04 MED ORDER — TRANEXAMIC ACID-NACL 1000-0.7 MG/100ML-% IV SOLN
1000.0000 mg | INTRAVENOUS | Status: AC
Start: 2021-12-04 — End: 2021-12-04
  Administered 2021-12-04: 1000 mg via INTRAVENOUS
  Filled 2021-12-04: qty 100

## 2021-12-04 MED ORDER — PHENYLEPHRINE 80 MCG/ML (10ML) SYRINGE FOR IV PUSH (FOR BLOOD PRESSURE SUPPORT)
PREFILLED_SYRINGE | INTRAVENOUS | Status: DC | PRN
  Administered 2021-12-04 (×6): 80 ug via INTRAVENOUS

## 2021-12-04 SURGICAL SUPPLY — 92 items
ADH SKN CLS APL DERMABOND .7 (GAUZE/BANDAGES/DRESSINGS)
AGENT HMST KT MTR STRL THRMB (HEMOSTASIS)
BAG COUNTER SPONGE SURGICOUNT (BAG) ×2 IMPLANT
BAG SPNG CNTER NS LX DISP (BAG) ×1
BLADE CLIPPER SURG (BLADE) IMPLANT
BUR MATCHSTICK NEURO 3.0 LAGG (BURR) ×2 IMPLANT
BUR RND FLUTED 2.5 (BURR) IMPLANT
CAGE SABLE 10X26 15D (Cage) ×1 IMPLANT
CAGE SABLE 10X26 6-12 8D (Cage) ×1 IMPLANT
CANNULA GRAFT BNE VG PRE-FILL (Bone Implant) IMPLANT
CAP LOCKING THREADED (Cap) ×6 IMPLANT
CNTNR URN SCR LID CUP LEK RST (MISCELLANEOUS) IMPLANT
CONT SPEC 4OZ STRL OR WHT (MISCELLANEOUS) ×2
COVER BACK TABLE 80X110 HD (DRAPES) ×3 IMPLANT
COVER MAYO STAND STRL (DRAPES) ×4 IMPLANT
COVER SURGICAL LIGHT HANDLE (MISCELLANEOUS) ×2 IMPLANT
DERMABOND ADVANCED (GAUZE/BANDAGES/DRESSINGS)
DERMABOND ADVANCED .7 DNX12 (GAUZE/BANDAGES/DRESSINGS) ×1 IMPLANT
DISPENSER GRAFT BNE VG (MISCELLANEOUS) IMPLANT
DISPENSER VIVIGEN BONE GRAFT (MISCELLANEOUS) ×2 IMPLANT
DRAPE C-ARM 35X43 STRL (DRAPES) IMPLANT
DRAPE C-ARM 42X72 X-RAY (DRAPES) ×4 IMPLANT
DRAPE C-ARMOR (DRAPES) ×1 IMPLANT
DRAPE MICROSCOPE LEICA 54X105 (DRAPES) ×1 IMPLANT
DRAPE SURG 17X23 STRL (DRAPES) ×3 IMPLANT
DRIVER SABLE GL 26 (ORTHOPEDIC DISPOSABLE SUPPLIES) IMPLANT
DRSG MEPILEX BORDER 4X12 (GAUZE/BANDAGES/DRESSINGS) ×1 IMPLANT
DRSG MEPILEX BORDER 4X4 (GAUZE/BANDAGES/DRESSINGS) IMPLANT
DRSG MEPILEX BORDER 4X8 (GAUZE/BANDAGES/DRESSINGS) IMPLANT
DRSG MEPILEX POST OP 4X8 (GAUZE/BANDAGES/DRESSINGS) IMPLANT
DURAPREP 26ML APPLICATOR (WOUND CARE) ×2 IMPLANT
ELECT BLADE 6.5 EXT (BLADE) IMPLANT
ELECT CAUTERY BLADE 6.4 (BLADE) ×2 IMPLANT
ELECT REM PT RETURN 9FT ADLT (ELECTROSURGICAL) ×2
ELECTRODE REM PT RTRN 9FT ADLT (ELECTROSURGICAL) ×1 IMPLANT
EVACUATOR 1/8 PVC DRAIN (DRAIN) IMPLANT
GLOVE BIOGEL PI IND STRL 8 (GLOVE) ×1 IMPLANT
GLOVE BIOGEL PI INDICATOR 8 (GLOVE) ×1
GLOVE ECLIPSE 9.0 STRL (GLOVE) ×1 IMPLANT
GLOVE ORTHO TXT STRL SZ7.5 (GLOVE) ×1 IMPLANT
GLOVE SURG 8.5 LATEX PF (GLOVE) ×1 IMPLANT
GOWN STRL REUS W/ TWL LRG LVL3 (GOWN DISPOSABLE) ×1 IMPLANT
GOWN STRL REUS W/TWL 2XL LVL3 (GOWN DISPOSABLE) ×4 IMPLANT
GOWN STRL REUS W/TWL LRG LVL3 (GOWN DISPOSABLE) ×6
GRAFT BONE CANNULA VIVIGEN 3 (Bone Implant) ×10 IMPLANT
HEMOSTAT SURGICEL 2X14 (HEMOSTASIS) IMPLANT
KIT BASIN OR (CUSTOM PROCEDURE TRAY) ×2 IMPLANT
KIT POSITION SURG JACKSON T1 (MISCELLANEOUS) ×2 IMPLANT
KIT SHAFT THRD SABLE GLBU (ORTHOPEDIC DISPOSABLE SUPPLIES) ×1 IMPLANT
KIT TURNOVER KIT B (KITS) ×2 IMPLANT
NDL ASP BONE MRW 11GX15 (NEEDLE) IMPLANT
NDL SAFETY ECLIPSE 18X1.5 (NEEDLE) IMPLANT
NEEDLE 22X1 1/2 (OR ONLY) (NEEDLE) ×2 IMPLANT
NEEDLE ASP BONE MRW 11GX15 (NEEDLE) IMPLANT
NEEDLE BONE MARROW 8GAX6 (NEEDLE) IMPLANT
NEEDLE HYPO 18GX1.5 SHARP (NEEDLE) ×2
NS IRRIG 1000ML POUR BTL (IV SOLUTION) ×2 IMPLANT
PACK LAMINECTOMY ORTHO (CUSTOM PROCEDURE TRAY) ×2 IMPLANT
PAD ARMBOARD 7.5X6 YLW CONV (MISCELLANEOUS) ×4 IMPLANT
PATTIES SURGICAL .75X.75 (GAUZE/BANDAGES/DRESSINGS) ×1 IMPLANT
ROD 65MM SPINAL (Rod) IMPLANT
ROD 70MM SPINAL (Rod) ×1 IMPLANT
ROD 75MM SPINAL (Rod) ×1 IMPLANT
ROD CREO 60MM (Rod) IMPLANT
ROD SPNL 5.5 CREO TI 65 (Rod) IMPLANT
SCREW MOD CREO 7.5X35 (Screw) ×1 IMPLANT
SCREW MOD SD CREO 7.5X45 (Screw) ×2 IMPLANT
SCREW MOD SD CREO 7.5X50 (Screw) ×3 IMPLANT
SCREW PA THRD CREO TULIP 5.5X4 (Head) ×6 IMPLANT
SPONGE SURGIFOAM ABS GEL 100 (HEMOSTASIS) ×2 IMPLANT
SPONGE T-LAP 4X18 ~~LOC~~+RFID (SPONGE) ×3 IMPLANT
STAPLER VISISTAT 35W (STAPLE) ×1 IMPLANT
SURGIFLO W/THROMBIN 8M KIT (HEMOSTASIS) IMPLANT
SUT VIC AB 0 CT1 27 (SUTURE)
SUT VIC AB 0 CT1 27XBRD ANBCTR (SUTURE) ×1 IMPLANT
SUT VIC AB 1 CTX 36 (SUTURE) ×4
SUT VIC AB 1 CTX36XBRD ANBCTR (SUTURE) ×1 IMPLANT
SUT VIC AB 2-0 CT1 27 (SUTURE) ×4
SUT VIC AB 2-0 CT1 TAPERPNT 27 (SUTURE) ×1 IMPLANT
SUT VIC AB 3-0 X1 27 (SUTURE) ×3 IMPLANT
SYR 20ML LL LF (SYRINGE) ×2 IMPLANT
SYR CONTROL 10ML LL (SYRINGE) ×4 IMPLANT
TAP SURG GLBU 6.5X40 (ORTHOPEDIC DISPOSABLE SUPPLIES) ×1 IMPLANT
TAP SURG THRD GL 4.5X40 (TAP) IMPLANT
TAP SURG THRD GL 5.5X40 (TAP) ×3 IMPLANT
TAP SURG THRD GL 7.5X40 (TAP) ×1 IMPLANT
TOWEL GREEN STERILE (TOWEL DISPOSABLE) ×2 IMPLANT
TOWEL GREEN STERILE FF (TOWEL DISPOSABLE) ×2 IMPLANT
TRAY FOLEY MTR SLVR 16FR STAT (SET/KITS/TRAYS/PACK) ×1 IMPLANT
TRAY FOLEY SLVR 16FR LF STAT (SET/KITS/TRAYS/PACK) ×1 IMPLANT
WATER STERILE IRR 1000ML POUR (IV SOLUTION) ×2 IMPLANT
YANKAUER SUCT BULB TIP NO VENT (SUCTIONS) ×2 IMPLANT

## 2021-12-04 NOTE — Brief Op Note (Signed)
12/04/2021  2:17 PM  PATIENT:  Scott Tucker  56 y.o. male  PRE-OPERATIVE DIAGNOSIS:  lumbar spondylolisthesis Lumbar three - four and Lumbar four - five above Lumbar five - Sacral one fusion  POST-OPERATIVE DIAGNOSIS:  lumbar spondylolisthesis Lumbar three - four and Lumbar four - five above Lumbar five - Sacral one fusion  PROCEDURE:  Procedure(s): TRANFORAMINAL LUMBAR INTERBODY FUSIONS LEFT LUMBAR THREE - FOUR AND LEFT LUMBAR FOUR - FIVE WITH REMOVAL OF HARDWARE LUMBAR FIVE -SACRAL ONE, POSTERIOR INSTRUMENTATION LUMBAR THREE, LUMBAR FOUR, LUMBAR FIVE AND SACRAL ONE, RODS SCREWS AND CAGES, LOCAL AND ALLOGRAFT BONE GRAFT (N/A)  SURGEON:  Surgeon(s) and Role:    * Kerrin Champagne, MD - Primary  PHYSICIAN ASSISTANT: [name], PA-C  ANESTHESIA:   local and general, Dr. Krista Blue.  EBL:  650 mL   BLOOD ADMINISTERED: 350 CC CELLSAVER  DRAINS: Urinary Catheter (Foley)   LOCAL MEDICATIONS USED:  MARCAINE0.5% 1:1 EXPAREL 1.3%  Amount: 30 ml  SPECIMEN:  No Specimen  DISPOSITION OF SPECIMEN:  N/A  COUNTS:  YES  TOURNIQUET:  * No tourniquets in log *  DICTATION: .Dragon Dictation  PLAN OF CARE: Admit to inpatient   PATIENT DISPOSITION:  PACU - hemodynamically stable.   Delay start of Pharmacological VTE agent (>24hrs) due to surgical blood loss or risk of bleeding: yes

## 2021-12-04 NOTE — Plan of Care (Signed)

## 2021-12-04 NOTE — Anesthesia Procedure Notes (Addendum)
Procedure Name: Intubation Date/Time: 12/04/2021 7:58 AM  Performed by: Vonna Drafts, CRNAPre-anesthesia Checklist: Patient identified, Emergency Drugs available, Suction available and Patient being monitored Patient Re-evaluated:Patient Re-evaluated prior to induction Oxygen Delivery Method: Circle system utilized Preoxygenation: Pre-oxygenation with 100% oxygen Induction Type: IV induction Ventilation: Mask ventilation without difficulty Laryngoscope Size: Glidescope and 4 Grade View: Grade I Tube type: Oral Tube size: 7.5 mm Number of attempts: 1 Airway Equipment and Method: Stylet, Oral airway and Bite block Placement Confirmation: ETT inserted through vocal cords under direct vision, positive ETCO2 and breath sounds checked- equal and bilateral Secured at: 23 cm Tube secured with: Tape Dental Injury: Teeth and Oropharynx as per pre-operative assessment  Difficulty Due To: Difficulty was anticipated and Difficult Airway- due to reduced neck mobility Comments: CRNA attempted DL x1 with Mac 4, MD attempted DL x1 with Miller 2 prior to moving to glidescope.

## 2021-12-04 NOTE — Interval H&P Note (Signed)
History and Physical Interval Note:  12/04/2021 7:40 AM  Scott Tucker  has presented today for surgery, with the diagnosis of lumbar spondylolisthesis L3-4 and L4-5 above L5-S1 fusion.  The various methods of treatment have been discussed with the patient and family. After consideration of risks, benefits and other options for treatment, the patient has consented to  Procedure(s): TRANFORAMINAL LUMBAR INTERBODY FUSIONS LEFT L3-4 AND LEFT L4-5 WITH REMOVAL OF HARDWARE L5-S1, POSTERIOR INSTRUMENTATION L3, L4, L5 AND S1, RODS SCREWS AND CAGES, LOCAL AND ALLOGRAFT BONE GRAFT, VIVIGEN (N/A) as a surgical intervention.  The patient's history has been reviewed, patient examined, no change in status, stable for surgery.  I have reviewed the patient's chart and labs.  Questions were answered to the patient's satisfaction.     [name]

## 2021-12-04 NOTE — Plan of Care (Signed)

## 2021-12-04 NOTE — H&P (Signed)
Scott Tucker is an 56 y.o. male.   Chief Complaint: low back pain and LE radiculopathy  HPI: 56 year old black male with history of multilevel lumbar stenosis comes in for prep evaluation.  States that symptoms unchanged from previous visit.  He is wanting to proceed with TRANFORAMINAL LUMBAR INTERBODY FUSIONS LEFT L3-4 AND LEFT L4-5 WITH REMOVAL OF HARDWARE L5-S1, POSTERIOR INSTRUMENTATION L3, L4, L5 AND S1, RODS SCREWS AND CAGES, LOCAL AND ALLOGRAFT BONE GRAFT, VIVIGEN as scheduled.  We have received preop cardiac clearance.  Today history and physical performed.  Review of systems positive for headaches that he has been told is related to ongoing issues with the cervical spine.  Has left trapezius spasm that radiates pain into the occipital region..      Past Medical History:  Diagnosis Date   Anginal pain (HCC)    Anxiety    Arthritis    Chronic back pain    Coronary artery disease    Diabetes mellitus without complication (HCC)    type 2   Dyspnea    Headache    HTN (hypertension)    Neuromuscular disorder (HCC)     Past Surgical History:  Procedure Laterality Date   ANTERIOR CERVICAL DECOMP/DISCECTOMY FUSION N/A 12/04/2020   Procedure: ANTERIOR CERVICAL DISCECTOMY FUSION C3-4, C4-5, ALLOGRAFT, PLATE;  Surgeon: Eldred Manges, MD;  Location: [facility] OR;  Service: Orthopedics;  Laterality: N/A;  needs RNFA   BACK SURGERY     1992 and 2003(dr kritzer)   CARDIAC CATHETERIZATION     SHOULDER SURGERY Right    2017   TONSILLECTOMY     removed as a child    Family History  Problem Relation Age of Onset   Heart disease Maternal Uncle    Social History:  reports that he has never smoked. He has never used smokeless tobacco. He reports current alcohol use of about 3.0 standard drinks of alcohol per week. He reports current drug use. Drug: Marijuana.  Allergies:  Allergies  Allergen Reactions   Latex Rash    Medications Prior to Admission  Medication Sig Dispense Refill    amLODipine (NORVASC) 10 MG tablet Take 1 tablet (10 mg total) by mouth once daily. 90 tablet 1   aspirin 81 MG EC tablet Take 81 mg by mouth daily.     atorvastatin (LIPITOR) 40 MG tablet Take 1 tablet (40 mg total) by mouth daily. 90 tablet 3   cetirizine (ZYRTEC) 10 MG tablet Take 1 tablet (10 mg total) by mouth daily. 30 tablet 11   HYDROcodone-acetaminophen (NORCO/VICODIN) 5-325 MG tablet Take 1 tablet by mouth every 12 (twelve) hours as needed for moderate pain. 30 tablet 0   losartan (COZAAR) 25 MG tablet Take 1 tablet (25 mg total) by mouth once daily. (Please make overdue appt with Dr. Eldridge Dace before anymore refills. Thank you 1st attempt) 90 tablet 1   nitroGLYCERIN (NITROSTAT) 0.4 MG SL tablet Place 1 tablet (0.4 mg total) under the tongue every 5 (five) minutes as needed for chest pain. 75 tablet 1   ciclopirox (PENLAC) 8 % solution Apply topically at bedtime. Apply over nail and surrounding skin. Apply daily over previous coat. After seven (7) days, may remove with alcohol and continue cycle. 6.6 mL 0   empagliflozin (JARDIANCE) 25 MG TABS tablet Take 1 tablet (25 mg total) by mouth once daily before breakfast. 90 tablet 1   ergocalciferol (VITAMIN D2) 1.25 MG (50000 UT) capsule Take 1 capsule (50,000 Units total) by mouth once  a week. (Patient not taking: Reported on 11/28/2021) 12 capsule 0   traZODone (DESYREL) 50 MG tablet Take 0.5-1 tablets (25-50 mg total) by mouth once nightly at bedtime as needed for sleep. (Patient not taking: Reported on 11/28/2021) 30 tablet 3    Results for orders placed or performed during the hospital encounter of 12/04/21 (from the past 48 hour(s))  Glucose, capillary     Status: Abnormal   Collection Time: 12/04/21  5:46 AM  Result Value Ref Range   Glucose-Capillary 158 (H) 70 - 99 mg/dL    Comment: Glucose reference range applies only to samples taken after fasting for at least 8 hours.   No results found.  Review of Systems  Constitutional:   Positive for activity change.  HENT: Negative.    Respiratory: Negative.    Cardiovascular: Negative.   Gastrointestinal: Negative.   Musculoskeletal:  Positive for back pain, gait problem and neck pain.    Blood pressure 134/79, pulse 79, temperature 97.8 F (36.6 C), temperature source Oral, resp. rate 18, height 5\' 6"  (1.676 m), weight 86.2 kg, SpO2 96 %. Physical Exam Constitutional:      Appearance: Normal appearance.  HENT:     Head: Normocephalic and atraumatic.     Nose: Nose normal.  Eyes:     Extraocular Movements: Extraocular movements intact.  Cardiovascular:     Rate and Rhythm: Normal rate and regular rhythm.     Heart sounds: No murmur heard. Pulmonary:     Effort: Pulmonary effort is normal. No respiratory distress.  Abdominal:     General: Bowel sounds are normal. There is no distension.     Tenderness: There is no abdominal tenderness.  Neurological:     Mental Status: He is alert and oriented to person, place, and time.  Psychiatric:        Mood and Affect: Mood normal.      Assessment/Plan L3-4 and L4-5 HNP/stenosis   Plan We will proceed with surgery as scheduled.  All questions answered.  Patient states that he will be needing short skilled facility placement postop for rehab.  He will not have any assistance when he is discharged from the hospital. In Regards to his left trapezius spasm I will see if I can get him scheduled with physical therapy for a few days before surgery to see if dry needling helps.  , PA-C 12/04/2021, 6:38 AM

## 2021-12-04 NOTE — Discharge Instructions (Signed)

## 2021-12-04 NOTE — Anesthesia Postprocedure Evaluation (Signed)
Anesthesia Post Note  Patient: Scott Tucker  Procedure(s) Performed: TRANFORAMINAL LUMBAR INTERBODY FUSIONS LEFT LUMBAR THREE - FOUR AND LEFT LUMBAR FOUR - FIVE WITH REMOVAL OF HARDWARE LUMBAR FIVE -SACRAL ONE, POSTERIOR INSTRUMENTATION LUMBAR THREE, LUMBAR FOUR, LUMBAR FIVE AND SACRAL ONE, RODS SCREWS AND CAGES, LOCAL AND ALLOGRAFT BONE GRAFT (Back)     Patient location during evaluation: PACU Anesthesia Type: General Level of consciousness: sedated Pain management: pain level controlled Vital Signs Assessment: post-procedure vital signs reviewed and stable Respiratory status: spontaneous breathing and respiratory function stable Cardiovascular status: stable Postop Assessment: no apparent nausea or vomiting Anesthetic complications: yes   Encounter Notable Events  Notable Event Outcome Phase Comment  Difficult to intubate - expected  Intraprocedure Filed from anesthesia note documentation.    Last Vitals:  Vitals:   12/04/21 1530 12/04/21 1539  BP:  137/84  Pulse: 84 99  Resp: 11   Temp: 36.8 C 36.9 C  SpO2: 97% 93%    Last Pain:  Vitals:   12/04/21 1559  TempSrc:   PainSc: 10-Worst pain ever                 [name] DANIEL

## 2021-12-04 NOTE — Transfer of Care (Signed)
Immediate Anesthesia Transfer of Care Note  Patient: Scott Tucker  Procedure(s) Performed: TRANFORAMINAL LUMBAR INTERBODY FUSIONS LEFT LUMBAR THREE - FOUR AND LEFT LUMBAR FOUR - FIVE WITH REMOVAL OF HARDWARE LUMBAR FIVE -SACRAL ONE, POSTERIOR INSTRUMENTATION LUMBAR THREE, LUMBAR FOUR, LUMBAR FIVE AND SACRAL ONE, RODS SCREWS AND CAGES, LOCAL AND ALLOGRAFT BONE GRAFT (Back)  Patient Location: PACU  Anesthesia Type:General  Level of Consciousness: drowsy  Airway & Oxygen Therapy: Patient Spontanous Breathing and Patient connected to face mask oxygen  Post-op Assessment: Report given to RN and Post -op Vital signs reviewed and stable  Post vital signs: Reviewed and stable  Last Vitals:  Vitals Value Taken Time  BP 169/90 12/04/21 1415  Temp    Pulse 96 12/04/21 1416  Resp 18 12/04/21 1416  SpO2 95 % 12/04/21 1416  Vitals shown include unvalidated device data.  Last Pain:  Vitals:   12/04/21 0610  TempSrc:   PainSc: 10-Worst pain ever      Patients Stated Pain Goal: 3 (12/04/21 0610)  Complications:  Encounter Notable Events  Notable Event Outcome Phase Comment  Difficult to intubate - expected  Intraprocedure Filed from anesthesia note documentation.

## 2021-12-04 NOTE — Progress Notes (Signed)
Orthopedic Tech Progress Note Patient Details:  Scott Tucker 28-Oct-1965 [id number]  Ortho Devices Type of Ortho Device: Lumbar corsett Ortho Device/Splint Interventions: Ordered      [name] [name] 12/04/2021, 3:52 PM

## 2021-12-05 LAB — CBC
HCT: 33.7 % — ABNORMAL LOW (ref 39.0–52.0)
Hemoglobin: 11.6 g/dL — ABNORMAL LOW (ref 13.0–17.0)
MCH: 32.2 pg (ref 26.0–34.0)
MCHC: 34.4 g/dL (ref 30.0–36.0)
MCV: 93.6 fL (ref 80.0–100.0)
Platelets: 284 10*3/uL (ref 150–400)
RBC: 3.6 MIL/uL — ABNORMAL LOW (ref 4.22–5.81)
RDW: 13.5 % (ref 11.5–15.5)
WBC: 16.1 10*3/uL — ABNORMAL HIGH (ref 4.0–10.5)
nRBC: 0 % (ref 0.0–0.2)

## 2021-12-05 LAB — GLUCOSE, CAPILLARY
Glucose-Capillary: 171 mg/dL — ABNORMAL HIGH (ref 70–99)
Glucose-Capillary: 201 mg/dL — ABNORMAL HIGH (ref 70–99)
Glucose-Capillary: 150 mg/dL — ABNORMAL HIGH (ref 70–99)
Glucose-Capillary: 137 mg/dL — ABNORMAL HIGH (ref 70–99)

## 2021-12-05 LAB — BASIC METABOLIC PANEL
Anion gap: 7 (ref 5–15)
BUN: 5 mg/dL — ABNORMAL LOW (ref 6–20)
CO2: 25 mmol/L (ref 22–32)
Calcium: 8.2 mg/dL — ABNORMAL LOW (ref 8.9–10.3)
Chloride: 106 mmol/L (ref 98–111)
Creatinine, Ser: 0.56 mg/dL — ABNORMAL LOW (ref 0.61–1.24)
GFR, Estimated: >60 mL/min (ref >60)
Glucose, Bld: 136 mg/dL — ABNORMAL HIGH (ref 70–99)
Potassium: 3.4 mmol/L — ABNORMAL LOW (ref 3.5–5.1)
Sodium: 138 mmol/L (ref 135–145)

## 2021-12-05 MED ORDER — HYDROMORPHONE HCL 1 MG/ML IJ SOLN
1.0000 mg | INTRAMUSCULAR | Status: DC | PRN
  Administered 2021-12-05 – 2021-12-13 (×26): 1 mg via INTRAVENOUS
  Filled 2021-12-05 (×26): qty 1

## 2021-12-05 MED FILL — Thrombin For Soln Kit 20000 Unit: CUTANEOUS | Qty: 1 | Status: AC

## 2021-12-05 NOTE — TOC Initial Note (Addendum)
Transition of Care [facility]) - Initial/Assessment Note    Patient Details  Name: Scott Tucker MRN: [id number] Date of Birth: 05-16-1965  [facility] CM/SW Contact:    Epifanio Lesches, RN Phone Number: 12/05/2021, 2:33 PM  Clinical Narrative:                    S/p lumbar fusion, 7/25 NCM spoke with pt @ bedside regarding d/c planning. Pt states lives alone. Resides on the 2nd level of a condo complex. PTA independent with ADL's. States has supportive sgo, Carlos American [phone]). [name] to assist with care when d/c. Pt states interested in CIR for next level of care, not interested in SNF placement . Pt screened by CIR ,rehab consult for full assessment to be placed.  TOC team following for needs...  Expected Discharge Plan: IP Rehab Facility Barriers to Discharge: Continued Medical Work up   Patient Goals and CMS Choice     Choice offered to / list presented to : Patient  Expected Discharge Plan and Services Expected Discharge Plan: IP Rehab Facility   Discharge Planning Services: CM Consult   Living arrangements for the past 2 months: Single Family Home                                      Prior Living Arrangements/Services Living arrangements for the past 2 months: Single Family Home Lives with:: Self Patient language and need for interpreter reviewed:: Yes Do you feel safe going back to the place where you live?: Yes      Need for Family Participation in Patient Care: Yes (Comment) Care giver support system in place?: Yes (comment) Current home services: DME (cane) Criminal Activity/Legal Involvement Pertinent to Current Situation/Hospitalization: No - Comment as needed  Activities of Daily Living      Permission Sought/Granted   Permission granted to share information with : Yes, Verbal Permission Granted  Share Information with NAME: Phillips Hay, [phone]           Emotional Assessment Appearance:: Appears stated  age Attitude/Demeanor/Rapport: Gracious Affect (typically observed): Accepting Orientation: : Oriented to Self, Oriented to Place, Oriented to  Time, Oriented to Situation Alcohol / Substance Use: Not Applicable Psych Involvement: No (comment)  Admission diagnosis:  Lumbar stenosis [M48.061] Status post lumbar spinal fusion [Z98.1] Patient Active Problem List   Diagnosis Date Noted   Spondylolisthesis, lumbar region 12/04/2021    Class: Chronic   Degenerative disc disease, lumbar 12/04/2021   Lumbar stenosis 12/04/2021   Status post lumbar spinal fusion 12/04/2021   Spinal stenosis of lumbar region 05/30/2021   S/P cervical spinal fusion 12/18/2020   Chronic radicular cervical pain 05/01/2020   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 [place] [address] Phone: [phone] Fax: [phone]  [id number] [address] [place]), [place] - [address] [place] [address] Phone: [phone] Fax: [phone]  [id number] [place] Otto, [place] - [address] AT [facility] [address] [facility] [place] [address] Phone: [phone] Fax: [phone]     Social Determinants of Health (SDOH) Interventions    Readmission Risk Interventions     No data to display

## 2021-12-05 NOTE — Plan of Care (Addendum)
Patient bleed through dressing. New dressing placed. Patient was not actively bleeding when dressing removed. Patient moved from chair to bed. Will continue to monitor.   Problem: Education: Goal: Knowledge of General Education information will improve Description: Including pain rating scale, medication(s)/side effects and non-pharmacologic comfort measures Outcome: Progressing   Problem: Activity: Goal: Risk for activity intolerance will decrease Outcome: Progressing   Problem: Pain Managment: Goal: General experience of comfort will improve Outcome: Progressing   Problem: Safety: Goal: Ability to remain free from injury will improve Outcome: Progressing   Problem: Skin Integrity: Goal: Risk for impaired skin integrity will decrease Outcome: Progressing

## 2021-12-05 NOTE — Plan of Care (Signed)
  Problem: Safety: Goal: Ability to remain free from injury will improve Outcome: Progressing   Problem: Skin Integrity: Goal: Risk for impaired skin integrity will decrease Outcome: Progressing   Problem: Education: Goal: Ability to describe self-care measures that may prevent or decrease complications (Diabetes Survival Skills Education) will improve Outcome: Progressing   Problem: Fluid Volume: Goal: Ability to maintain a balanced intake and output will improve Outcome: Progressing   Problem: Nutritional: Goal: Maintenance of adequate nutrition will improve Outcome: Progressing   Problem: Skin Integrity: Goal: Risk for impaired skin integrity will decrease Outcome: Progressing

## 2021-12-05 NOTE — Plan of Care (Signed)
  Problem: Acute Rehab OT Goals (only OT should resolve) Goal: Pt. Will Perform Grooming Flowsheets (Taken 12/05/2021 1328) Pt Will Perform Grooming:  with modified independence  sitting Goal: Pt. Will Perform Upper Body Bathing Flowsheets (Taken 12/05/2021 1328) Pt Will Perform Upper Body Bathing:  with set-up  sitting Goal: Pt. Will Perform Lower Body Bathing Flowsheets (Taken 12/05/2021 1328) Pt Will Perform Lower Body Bathing:  with max assist  sit to/from stand  with adaptive equipment Goal: Pt. Will Perform Upper Body Dressing Flowsheets (Taken 12/05/2021 1328) Pt Will Perform Upper Body Dressing:  with set-up  sitting Goal: Pt. Will Perform Lower Body Dressing Flowsheets (Taken 12/05/2021 1328) Pt Will Perform Lower Body Dressing:  with max assist  sit to/from stand  with adaptive equipment Goal: Pt. Will Transfer To Toilet Flowsheets (Taken 12/05/2021 1328) Pt Will Transfer to Toilet:  with supervision  ambulating  bedside commode Goal: Pt. Will Perform Toileting-Clothing Manipulation Flowsheets (Taken 12/05/2021 1328) Pt Will Perform Toileting - Clothing Manipulation and hygiene:  with supervision  sit to/from stand Goal: Pt/Caregiver Will Perform Home Exercise Program Flowsheets (Taken 12/05/2021 1328) Pt/caregiver will Perform Home Exercise Program:  Increased strength  Both right and left upper extremity  With written HEP provided  Independently Goal: OT Additional ADL Goal #1 Flowsheets (Taken 12/05/2021 1328) Additional ADL Goal #1: Pt will increase bed mobility to Mod I in order to increase his ability to participate in bathing and dressing task while seated on EOB while maintaining back precautions with no reminders.

## 2021-12-05 NOTE — Progress Notes (Signed)
Inpatient Rehab Admissions Coordinator Note:   Per OT recommendations patient was screened for CIR candidacy by Stephania Fragmin, PT. At this time, pt appears to be a potential candidate for CIR. I will place an order for rehab consult for full assessment, per our protocol.  Please contact me any with questions. Note insurance prior authorization is required for CIR admit.   Estill Dooms, PT, DPT [phone] 12/05/21 1:44 PM

## 2021-12-05 NOTE — Evaluation (Signed)
Occupational Therapy Evaluation Patient Details Name: Scott Tucker MRN: [id number] DOB: [birth year] Today's Date: 12/05/2021   History of Present Illness Pt is a 56 y/o male presenting to acute OT s/p L3-4, L4-5, S1 fusion performed on 12/04/21. Pt with hx of spinal surgeries reporting first one completed in 1992.   Clinical Impression   Pt in bed upon therapy arrival and agreeable to participate in OT evaluation. Patient reports a 10/10 pain level although has been medicated within the last hour for pain.Pt demonstrated decreased BUE strength, endurance, activity tolerance, and increased pain level resulting in max difficulty completing BADL tasks and requires increased physical assist and increased time to complete. Pt was overall Mod I prior to recent spinal surgery and is motivated and willing to participate in therapy sessions in order to increase his functional performance during ADL tasks and be able to return home. Recommend CIR upon discharge to focus on mentioned deficits. OT will follow acutely.     Recommendations for follow up therapy are one component of a multi-disciplinary discharge planning process, led by the attending physician.  Recommendations may be updated based on patient status, additional functional criteria and insurance authorization.   Follow Up Recommendations  Acute inpatient rehab (3hours/day)    Assistance Recommended at Discharge Intermittent Supervision/Assistance  Patient can return home with the following A lot of help with walking and/or transfers;A lot of help with bathing/dressing/bathroom;Assistance with cooking/housework;Assist for transportation;Help with stairs or ramp for entrance    Functional Status Assessment  Patient has had a recent decline in their functional status and demonstrates the ability to make significant improvements in function in a reasonable and predictable amount of time.  Equipment Recommendations  BSC/3in1;Other (comment)  (TBD)    Recommendations for Other Services Rehab consult     Precautions / Restrictions Precautions Precautions: Back Precaution Booklet Issued: Yes (comment) (Provided back precaution handout and reviewed.) Precaution Comments: Corsett brace on when ambulating long distance. OK to remove in bed, when showering, and when ambulating to/from bathroom Required Braces or Orthoses: Spinal Brace Spinal Brace: Applied in sitting position Restrictions Weight Bearing Restrictions: No      Mobility Bed Mobility Overal bed mobility: Needs Assistance Bed Mobility: Supine to Sit     Supine to sit: HOB elevated, Min assist     General bed mobility comments: Due to pain level, did not flatten bed to attempt log roll. Provided VC for technique and form to transition to seated on EOB while maintaing back precautions. Slow and labored movements.    Transfers Overall transfer level: Needs assistance Equipment used: Rolling walker (2 wheels) Transfers: Sit to/from Stand, Bed to chair/wheelchair/BSC Sit to Stand: Min assist, From elevated surface     Step pivot transfers: Min assist, From elevated surface     General transfer comment: Slow and labored movements to complete transfer. Relied heavily on BUE while using RW.      Balance Overall balance assessment: Mild deficits observed, not formally tested, History of Falls        Utilized RW when performing sit to stand at EOB and transferring to recliner due to decreased balance requiring Min Assist to maintain.   ADL either performed or assessed with clinical judgement   ADL Overall ADL's : Needs assistance/impaired Eating/Feeding: Modified independent;Bed level   Grooming: Wash/dry hands;Wash/dry face;Oral care;Applying deodorant;Minimal assistance;Bed level   Upper Body Bathing: Minimal assistance;Sitting   Lower Body Bathing: Total assistance;Adhering to back precautions;Sit to/from stand   Upper Body Dressing : Minimal  assistance;Sitting   Lower Body Dressing: Total assistance;Adhering to back precautions;Sit to/from stand   Toilet Transfer: Minimal assistance;Rolling walker (2 wheels);Stand-pivot;Cueing for sequencing;Cueing for safety Toilet Transfer Details (indicate cue type and reason): increased pain experienced, slow and labored movement due to pain level, simulated from bed to recliner. Toileting- Clothing Manipulation and Hygiene: Total assistance;Adhering to back precautions;Sit to/from stand         Vision Baseline Vision/History: 0 No visual deficits Ability to See in Adequate Light: 0 Adequate Patient Visual Report: No change from baseline Vision Assessment?: No apparent visual deficits     Perception Perception Perception: Within Functional Limits   Praxis Praxis Praxis: Intact    Pertinent Vitals/Pain Pain Assessment Pain Assessment: 0-10 Pain Score: 10-Worst pain ever Pain Location: lower back Pain Descriptors / Indicators: Constant, Sore Pain Intervention(s): Premedicated before session, Monitored during session, Limited activity within patient's tolerance     Hand Dominance Right   Extremity/Trunk Assessment Upper Extremity Assessment Upper Extremity Assessment: Generalized weakness (Unable to assess fully due to pain level and back precautions)   Lower Extremity Assessment Lower Extremity Assessment: Defer to PT evaluation   Cervical / Trunk Assessment Cervical / Trunk Assessment: Back Surgery   Communication Communication Communication: No difficulties   Cognition Arousal/Alertness: Awake/alert Behavior During Therapy: WFL for tasks assessed/performed Overall Cognitive Status: Within Functional Limits for tasks assessed       General Comments  Utilized RW when performing sit to stand at EOB and transferring to recliner due to decreased balance requiring Min Assist to maintain.    Exercises Other Exercises Other Exercises: Back precaution education  completed with handout provided. Discussed compensatory techniques for basic ADL tasks such as sitting posture,  bathing, dressing, etc. Educated patient on donning Spinal brace while seated on EOB. VC provided for form and technique while patient completed.        Home Living Family/patient expects to be discharged to:: Private residence Living Arrangements: Alone Available Help at Discharge: Available PRN/intermittently (significant other works and will not be able to assist for ~4 weeks.) Type of Home: House (condo) Home Access: Stairs to enter;Other (comment) (Full flight of stairs to access bedroom and full bath.) Entrance Stairs-Number of Steps: 4 Entrance Stairs-Rails: None Home Layout: 1/2 bath on main level     Bathroom Shower/Tub: Tub/shower unit;Curtain (upstairs; half bath downstairs)   Bathroom Toilet: Standard Bathroom Accessibility: No (Pt reports that doorway is too narrow to access with RW.)   Home Equipment: Cane - single point          Prior Functioning/Environment Prior Level of Function : Needs assist;Independent/Modified Independent;Driving;History of Falls (last six months)             Mobility Comments: Recently started using a single point cane for mobility due to frequent falls. ADLs Comments: Ability to complete ADL tasks was dependent upon pain level. Pt reports that his girlfriend would provide assistance when available or he would attempt to figure it out for himself with increased time and effort.        OT Problem List: Decreased strength;Decreased coordination;Pain;Decreased range of motion;Decreased activity tolerance;Impaired balance (sitting and/or standing);Decreased knowledge of use of DME or AE;Impaired UE functional use;Decreased knowledge of precautions      OT Treatment/Interventions: Self-care/ADL training;Therapeutic exercise;Therapeutic activities;Neuromuscular education;Energy conservation;DME and/or AE instruction;Patient/family  education;Manual therapy;Balance training;Modalities    OT Goals(Current goals can be found in the care plan section) Acute Rehab OT Goals Patient Stated Goal: to go to CIR OT Goal Formulation:  With patient Time For Goal Achievement: 12/19/21 Potential to Achieve Goals: Good  OT Frequency: Min 3X/week       AM-PAC OT "6 Clicks" Daily Activity     Outcome Measure Help from another person eating meals?: None Help from another person taking care of personal grooming?: A Little Help from another person toileting, which includes using toliet, bedpan, or urinal?: A Lot Help from another person bathing (including washing, rinsing, drying)?: A Lot Help from another person to put on and taking off regular upper body clothing?: A Little Help from another person to put on and taking off regular lower body clothing?: Total 6 Click Score: 15   End of Session Equipment Utilized During Treatment: Gait belt;Rolling walker (2 wheels);Back brace Nurse Communication: Mobility status;Precautions;Other (comment) (Informed nursing regarding need for surgical dressing change due to saturation noted during functional transfer.)  Activity Tolerance: Patient tolerated treatment well;Patient limited by pain Patient left: in chair;with call bell/phone within reach;with nursing/sitter in room  OT Visit Diagnosis: Unsteadiness on feet (R26.81);Repeated falls (R29.6);Muscle weakness (generalized) (M62.81);Other symptoms and signs involving the nervous system (R29.898);Pain Pain - part of body:  (back)                Time: 1100-1135 OT Time Calculation (min): 35 min Charges:  OT General Charges $OT Visit: 1 Visit OT Evaluation $OT Eval High Complexity: 1 High  [employer], OTR/L,CBIS  Supplemental OT - [facility] and [facility]   Kloe Oates, Charisse March 12/05/2021, 1:27 PM

## 2021-12-05 NOTE — Progress Notes (Addendum)
     Subjective: 1 Day Post-Op Procedure(s) (LRB): TRANFORAMINAL LUMBAR INTERBODY FUSIONS LEFT LUMBAR THREE - FOUR AND LEFT LUMBAR FOUR - FIVE WITH REMOVAL OF HARDWARE LUMBAR FIVE -SACRAL ONE, POSTERIOR INSTRUMENTATION LUMBAR THREE, LUMBAR FOUR, LUMBAR FIVE AND SACRAL ONE, RODS SCREWS AND CAGES, LOCAL AND ALLOGRAFT BONE GRAFT (N/A) Awake, alert and oriented x 4. Voiding post foley removal without difficulty. IV discontinued. Has no help at home, lives alone. PT walking guardedly in hallway.   Patient reports pain as marked.    Objective:   VITALS:  Temp:  [97.8 F (36.6 C)-98.7 F (37.1 C)] 98.5 F (36.9 C) (07/26 0402) Pulse Rate:  [70-108] 70 (07/26 0402) Resp:  [11-20] 15 (07/26 0402) BP: (123-169)/(57-91) 135/67 (07/26 0402) SpO2:  [91 %-100 %] 91 % (07/26 0402)  Neurologically intact ABD soft Neurovascular intact Sensation intact distally Intact pulses distally Dorsiflexion/Plantar flexion intact Incision: moderate drainage No cellulitis present   LABS Recent Labs    12/05/21 0335  HGB 11.6*  WBC 16.1*  PLT 284   Recent Labs    12/05/21 0335  NA 138  K 3.4*  CL 106  CO2 25  BUN 5*  CREATININE 0.56*  GLUCOSE 136*   No results for input(s): "LABPT", "INR" in the last 72 hours.   Assessment/Plan: 1 Day Post-Op Procedure(s) (LRB): TRANFORAMINAL LUMBAR INTERBODY FUSIONS LEFT LUMBAR THREE - FOUR AND LEFT LUMBAR FOUR - FIVE WITH REMOVAL OF HARDWARE LUMBAR FIVE -SACRAL ONE, POSTERIOR INSTRUMENTATION LUMBAR THREE, LUMBAR FOUR, LUMBAR FIVE AND SACRAL ONE, RODS SCREWS AND CAGES, LOCAL AND ALLOGRAFT BONE GRAFT (N/A) Drainage likely secondary to vancomycin instilled in the incision at end of the case.   Advance diet Up with therapy D/C IV fluids Discharge to SNF continue with acute PT but will need SNF for short term recovery from  Surgery.   [name] 12/05/2021, 7:19 AM Patient ID: Scott Tucker, male   DOB: 18-Apr-1966, 56 y.o.   MRN: [id number]

## 2021-12-05 NOTE — Evaluation (Signed)
Physical Therapy Evaluation Patient Details Name: Scott Tucker MRN: [id number] DOB: 12-Dec-1965 Today's Date: 12/05/2021  History of Present Illness  Pt is a 56 y/o male presenting to acute OT s/p L3-4, L4-5, S1 fusion performed on 12/04/21. Pt with hx of spinal surgeries reporting first one completed in 1992.  Clinical Impression   Pt admitted with above diagnosis. Lives at home alone, in a two-level home with a few steps to enter; Prior to admission, pt was able to manage independently, using a cane for amb, but with a history of falls; Presents to PT with functional dependencies, low back pain, difficulty with gait and transfers; Overall needing assist with bed mobility, transfers, and progressive amb; Pt is very motivated to get to independence, is young, and worked hard during PT eval; Recommend acute inpatient rehab ([facility]) for post-acute therapy needs.  Pt currently with functional limitations due to the deficits listed below (see PT Problem List). Pt will benefit from skilled PT to increase their independence and safety with mobility to allow discharge to the venue listed below.          Recommendations for follow up therapy are one component of a multi-disciplinary discharge planning process, led by the attending physician.  Recommendations may be updated based on patient status, additional functional criteria and insurance authorization.  Follow Up Recommendations Acute inpatient rehab (3hours/day)      Assistance Recommended at Discharge Intermittent Supervision/Assistance  Patient can return home with the following       Equipment Recommendations Rolling walker (2 wheels);BSC/3in1 (consider 2 RWs -- one for upstairs, and one for downstairs)  Recommendations for Other Services  Rehab consult    Functional Status Assessment Patient has had a recent decline in their functional status and demonstrates the ability to make significant improvements in function in a reasonable and  predictable amount of time.     Precautions / Restrictions Precautions Precautions: Back Precaution Booklet Issued: Yes (comment) (Provided back precaution handout and reviewed.) Precaution Comments: Corsett brace on when ambulating long distance. OK to remove in bed, when showering, and when ambulating to/from bathroom Required Braces or Orthoses: Spinal Brace Spinal Brace: Applied in sitting position Restrictions Weight Bearing Restrictions: No      Mobility  Bed Mobility Overal bed mobility: Needs Assistance Bed Mobility: Sit to Sidelying         Sit to sidelying: Min assist General bed mobility comments: Slow, guarded descent to sidelying; min assist to guard and make sure bil feet got onto bed    Transfers Overall transfer level: Needs assistance Equipment used: Rolling walker (2 wheels) Transfers: Sit to/from Stand Sit to Stand: Min assist, From elevated surface   Step pivot transfers: Min assist, From elevated surface       General transfer comment: Slow and labored movements to complete transfer. Relied heavily on BUE while using RW.    Ambulation/Gait Ambulation/Gait assistance: Min assist, Min guard Gait Distance (Feet): 80 Feet Assistive device: Rolling walker (2 wheels) Gait Pattern/deviations: Step-through pattern, Decreased step length - right, Decreased step length - left, Decreased stride length Gait velocity: quite slow     General Gait Details: Slow, guarded steps, with heavy dependence on RW for supoprt; occasionally stopping to bend L knee and lift L foot, rotate ankle a bit; cues for smooth slow, deep breaths  Stairs            Wheelchair Mobility    Modified Rankin (Stroke Patients Only)       Balance  Overall balance assessment: Mild deficits observed, not formally tested, History of Falls                                           Pertinent Vitals/Pain Pain Assessment Pain Assessment: 0-10 Pain Score: 9   Pain Location: lower back Pain Descriptors / Indicators: Constant, Sore Pain Intervention(s): Premedicated before session    Home Living Family/patient expects to be discharged to:: Private residence Living Arrangements: Alone Available Help at Discharge: Available PRN/intermittently (significant other works and will not be able to assist for ~4 weeks.) Type of Home: House (condo) Home Access: Stairs to enter;Other (comment) (Full flight of stairs to access bedroom and full bath.) Entrance Stairs-Rails: None Entrance Stairs-Number of Steps: 4   Home Layout: 1/2 bath on main level Home Equipment: Cane - single point      Prior Function Prior Level of Function : Needs assist;Independent/Modified Independent;Driving;History of Falls (last six months)             Mobility Comments: Recently started using a single point cane for mobility due to frequent falls. ADLs Comments: Ability to complete ADL tasks was dependent upon pain level. Pt reports that his girlfriend would provide assistance when available or he would attempt to figure it out for himself with increased time and effort.     Hand Dominance   Dominant Hand: Right    Extremity/Trunk Assessment   Upper Extremity Assessment Upper Extremity Assessment: Generalized weakness    Lower Extremity Assessment Lower Extremity Assessment: Generalized weakness    Cervical / Trunk Assessment Cervical / Trunk Assessment: Back Surgery  Communication   Communication: No difficulties  Cognition Arousal/Alertness: Awake/alert Behavior During Therapy: WFL for tasks assessed/performed Overall Cognitive Status: Within Functional Limits for tasks assessed                                          General Comments General comments (skin integrity, edema, etc.): Dr. Otelia Sergeant joined [place] during session and seemded pleased with Mr. Eastman efforts    Exercises     Assessment/Plan    PT Assessment Patient needs  continued PT services  PT Problem List Decreased strength;Decreased range of motion;Decreased activity tolerance;Decreased balance;Decreased mobility;Decreased coordination;Decreased knowledge of use of DME;Decreased safety awareness;Decreased knowledge of precautions;Pain;Impaired sensation       PT Treatment Interventions DME instruction;Gait training;Stair training;Functional mobility training;Therapeutic activities;Therapeutic exercise;Balance training;Patient/family education    PT Goals (Current goals can be found in the Care Plan section)  Acute Rehab PT Goals Patient Stated Goal: be able to walk without pain PT Goal Formulation: With patient Time For Goal Achievement: 12/19/21 Potential to Achieve Goals: Good    Frequency Min 5X/week     Co-evaluation               AM-PAC PT "6 Clicks" Mobility  Outcome Measure Help needed turning from your back to your side while in a flat bed without using bedrails?: A Little Help needed moving from lying on your back to sitting on the side of a flat bed without using bedrails?: A Little Help needed moving to and from a bed to a chair (including a wheelchair)?: A Lot Help needed standing up from a chair using your arms (e.g., wheelchair or bedside chair)?: A Little Help needed  to walk in hospital room?: A Lot Help needed climbing 3-5 steps with a railing? : A Lot 6 Click Score: 15    End of Session Equipment Utilized During Treatment: Back brace Activity Tolerance: Patient tolerated treatment well Patient left: in bed;with call bell/phone within reach Nurse Communication: Mobility status PT Visit Diagnosis: Unsteadiness on feet (R26.81);Other abnormalities of gait and mobility (R26.89);Pain Pain - Right/Left:  (Low back)    Time: 6333-5456 PT Time Calculation (min) (ACUTE ONLY): 38 min   Charges:   PT Evaluation $PT Eval Moderate Complexity: 1 Mod PT Treatments $Gait Training: 23-37 mins        Van Clines, PT   Acute Rehabilitation Services Office [phone]   [name] 12/05/2021, 3:20 PM

## 2021-12-06 LAB — GLUCOSE, CAPILLARY
Glucose-Capillary: 175 mg/dL — ABNORMAL HIGH (ref 70–99)
Glucose-Capillary: 104 mg/dL — ABNORMAL HIGH (ref 70–99)
Glucose-Capillary: 112 mg/dL — ABNORMAL HIGH (ref 70–99)
Glucose-Capillary: 130 mg/dL — ABNORMAL HIGH (ref 70–99)

## 2021-12-06 NOTE — Progress Notes (Signed)
     Subjective: 2 Days Post-Op Procedure(s) (LRB): TRANFORAMINAL LUMBAR INTERBODY FUSIONS LEFT LUMBAR THREE - FOUR AND LEFT LUMBAR FOUR - FIVE WITH REMOVAL OF HARDWARE LUMBAR FIVE -SACRAL ONE, POSTERIOR INSTRUMENTATION LUMBAR THREE, LUMBAR FOUR, LUMBAR FIVE AND SACRAL ONE, RODS SCREWS AND CAGES, LOCAL AND ALLOGRAFT BONE GRAFT (N/A)Awake, lying on right side in bed with legs flexed, moderate discomfort. Able to walk with walker in hallway. Voiding post foley removal yesterday.  Patient reports pain as moderate.    Objective:   VITALS:  Temp:  [97.9 F (36.6 C)-98.7 F (37.1 C)] 97.9 F (36.6 C) (07/27 1939) Pulse Rate:  [86-92] 89 (07/27 1939) Resp:  [16-18] 16 (07/27 1939) BP: (111-128)/(59-69) 128/64 (07/27 1939) SpO2:  [95 %-96 %] 95 % (07/27 1939)  Neurologically intact ABD soft Neurovascular intact Sensation intact distally Intact pulses distally Dorsiflexion/Plantar flexion intact Incision: scant drainage Compartment soft   LABS Recent Labs    12/05/21 0335  HGB 11.6*  WBC 16.1*  PLT 284   Recent Labs    12/05/21 0335  NA 138  K 3.4*  CL 106  CO2 25  BUN 5*  CREATININE 0.56*  GLUCOSE 136*   No results for input(s): "LABPT", "INR" in the last 72 hours.   Assessment/Plan: 2 Days Post-Op Procedure(s) (LRB): TRANFORAMINAL LUMBAR INTERBODY FUSIONS LEFT LUMBAR THREE - FOUR AND LEFT LUMBAR FOUR - FIVE WITH REMOVAL OF HARDWARE LUMBAR FIVE -SACRAL ONE, POSTERIOR INSTRUMENTATION LUMBAR THREE, LUMBAR FOUR, LUMBAR FIVE AND SACRAL ONE, RODS SCREWS AND CAGES, LOCAL AND ALLOGRAFT BONE GRAFT (N/A)  Advance diet Up with therapy D/C IV fluids Plan for discharge tomorrow Discharge to SNF  [name] 12/06/2021, 11:55 PM Patient ID: Scott Tucker, male   DOB: Nov 27, 1965, 56 y.o.   MRN: [id number]

## 2021-12-06 NOTE — Progress Notes (Signed)
Inpatient Rehab Admissions:  Inpatient Rehab Consult received.  I met with patient and girlfriend Scott Tucker at the bedside for rehabilitation assessment and to discuss goals and expectations of an inpatient rehab admission.  Both acknowledged understanding of CIR goals and expectations. Pt interested in CIR and girlfriend supportive. Girlfriend confirmed that she will be able to provide intermittent support. Will continue to follow.  Signed: Gayland Curry, [place] Admissions Coordinator [phone]

## 2021-12-06 NOTE — Progress Notes (Signed)
Physical Therapy Treatment Patient Details Name: Scott Tucker MRN: [id number] DOB: 22-Dec-1965 Today's Date: 12/06/2021   History of Present Illness Pt is a 56 y/o male presenting to acute OT s/p L3-4, L4-5, S1 fusion performed on 12/04/21. Pt with hx of spinal surgeries reporting first one completed in 1992.    PT Comments    Patient up in chair without brace on arrival. Reports incr drainage from dressing and waiting for RN to change it. Wanted to proceed with mobility while waiting due to increased pain. Patient moving very slowly due to pain, however determined to walk as far as he walked yesterday (and even exceeded). Noted occasional instability left knee but no buckling with heavy reliance on bil UEs via RW.     Recommendations for follow up therapy are one component of a multi-disciplinary discharge planning process, led by the attending physician.  Recommendations may be updated based on patient status, additional functional criteria and insurance authorization.  Follow Up Recommendations  Acute inpatient rehab (3hours/day)     Assistance Recommended at Discharge Intermittent Supervision/Assistance  Patient can return home with the following     Equipment Recommendations  Rolling walker (2 wheels);BSC/3in1 (consider 2 RWs -- one for upstairs, and one for downstairs)    Recommendations for Other Services Rehab consult     Precautions / Restrictions Precautions Precautions: Back Precaution Booklet Issued: No (handout already provided) Precaution Comments: pt unable to state any of back precautions; reviewed and needed cues x1 to avoid twisting. Corsett brace on when ambulating long distance. OK to remove in bed, when showering, and when ambulating to/from bathroom Required Braces or Orthoses: Spinal Brace Spinal Brace: Applied in standing position Restrictions Weight Bearing Restrictions: No     Mobility  Bed Mobility                    Transfers Overall  transfer level: Needs assistance Equipment used: Rolling walker (2 wheels) Transfers: Sit to/from Stand Sit to Stand: Min guard           General transfer comment: Slow and labored movements to complete transfer. Relied heavily on BUE    Ambulation/Gait Ambulation/Gait assistance: Min Paediatric nurse (Feet): 124 Feet Assistive device: Rolling walker (2 wheels) Gait Pattern/deviations: Decreased step length - right, Decreased step length - left, Decreased stride length, Step-to pattern Gait velocity: 0.12 ft/sec Gait velocity interpretation: <1.31 ft/sec, indicative of household ambulator   General Gait Details: Extremely slow, guarded steps, with heavy dependence on RW for supoprt; occasionally stopping to rest arms; cues for smooth slow, deep breaths   Stairs             Wheelchair Mobility    Modified Rankin (Stroke Patients Only)       Balance Overall balance assessment: Mild deficits observed, not formally tested, History of Falls                                          Cognition Arousal/Alertness: Awake/alert Behavior During Therapy: WFL for tasks assessed/performed Overall Cognitive Status: Within Functional Limits for tasks assessed                                          Exercises Other Exercises Other Exercises: Educated patient on donning Spinal brace while  standing. VC provided for form and technique while patient completed.    General Comments        Pertinent Vitals/Pain Pain Assessment Pain Assessment: 0-10 Pain Score: 10-Worst pain ever Pain Location: lower back Pain Descriptors / Indicators: Constant, Sore, Crying Pain Intervention(s): Monitored during session, Limited activity within patient's tolerance, Repositioned, Relaxation, Other (comment) (pt encouraged to limit gait distance, but he was determined to do at least what he did yesterday)    Home Living                           Prior Function            PT Goals (current goals can now be found in the care plan section) Acute Rehab PT Goals Patient Stated Goal: be able to walk without pain PT Goal Formulation: With patient Time For Goal Achievement: 12/19/21 Potential to Achieve Goals: Good Progress towards PT goals: Progressing toward goals    Frequency    Min 5X/week      PT Plan Current plan remains appropriate    Co-evaluation              AM-PAC PT "6 Clicks" Mobility   Outcome Measure  Help needed turning from your back to your side while in a flat bed without using bedrails?: A Little Help needed moving from lying on your back to sitting on the side of a flat bed without using bedrails?: A Little Help needed moving to and from a bed to a chair (including a wheelchair)?: A Little Help needed standing up from a chair using your arms (e.g., wheelchair or bedside chair)?: A Little Help needed to walk in hospital room?: A Little Help needed climbing 3-5 steps with a railing? : A Lot 6 Click Score: 17    End of Session Equipment Utilized During Treatment: Back brace Activity Tolerance: Patient limited by pain Patient left: with call bell/phone within reach;in chair Nurse Communication: Mobility status;Other (comment);Patient requests pain meds (asking for linens changed) PT Visit Diagnosis: Unsteadiness on feet (R26.81);Other abnormalities of gait and mobility (R26.89);Pain Pain - Right/Left:  (Low back)     Time: 8546-2703 PT Time Calculation (min) (ACUTE ONLY): 38 min  Charges:  $Gait Training: 38-52 mins                      Scott Tucker, PT Acute Rehabilitation Services  Office [phone]    Scott Tucker 12/06/2021, 10:46 AM

## 2021-12-06 NOTE — Plan of Care (Signed)
  Problem: Safety: Goal: Ability to remain free from injury will improve Outcome: Progressing   Problem: Skin Integrity: Goal: Risk for impaired skin integrity will decrease Outcome: Progressing   Problem: Coping: Goal: Ability to adjust to condition or change in health will improve Outcome: Progressing   Problem: Nutritional: Goal: Maintenance of adequate nutrition will improve Outcome: Progressing   Problem: Skin Integrity: Goal: Risk for impaired skin integrity will decrease Outcome: Progressing

## 2021-12-07 ENCOUNTER — Inpatient Hospital Stay (HOSPITAL_COMMUNITY): Payer: Medicare Other

## 2021-12-07 DIAGNOSIS — R109 Unspecified abdominal pain: Secondary | ICD-10-CM | POA: Diagnosis not present

## 2021-12-07 DIAGNOSIS — Z981 Arthrodesis status: Secondary | ICD-10-CM | POA: Diagnosis not present

## 2021-12-07 DIAGNOSIS — Z9889 Other specified postprocedural states: Secondary | ICD-10-CM | POA: Diagnosis not present

## 2021-12-07 DIAGNOSIS — M4326 Fusion of spine, lumbar region: Secondary | ICD-10-CM | POA: Diagnosis not present

## 2021-12-07 LAB — CBC WITH DIFFERENTIAL/PLATELET
Abs Immature Granulocytes: 0.05 10*3/uL (ref 0.00–0.07)
Abs Immature Granulocytes: 0.05 10*3/uL (ref 0.00–0.07)
Basophils Absolute: 0 10*3/uL (ref 0.0–0.1)
Basophils Absolute: 0 10*3/uL (ref 0.0–0.1)
Basophils Relative: 0 %
Basophils Relative: 0 %
Eosinophils Absolute: 0 10*3/uL (ref 0.0–0.5)
Eosinophils Absolute: 0 10*3/uL (ref 0.0–0.5)
Eosinophils Relative: 0 %
Eosinophils Relative: 0 %
HCT: 35.5 % — ABNORMAL LOW (ref 39.0–52.0)
HCT: 36 % — ABNORMAL LOW (ref 39.0–52.0)
Hemoglobin: 12.3 g/dL — ABNORMAL LOW (ref 13.0–17.0)
Hemoglobin: 12.4 g/dL — ABNORMAL LOW (ref 13.0–17.0)
Immature Granulocytes: 0 %
Immature Granulocytes: 0 %
Lymphocytes Relative: 16 %
Lymphocytes Relative: 9 %
Lymphs Abs: 1.8 10*3/uL (ref 0.7–4.0)
Lymphs Abs: 1.2 10*3/uL (ref 0.7–4.0)
MCH: 31.9 pg (ref 26.0–34.0)
MCH: 32.1 pg (ref 26.0–34.0)
MCHC: 34.6 g/dL (ref 30.0–36.0)
MCHC: 34.4 g/dL (ref 30.0–36.0)
MCV: 92.2 fL (ref 80.0–100.0)
MCV: 93.3 fL (ref 80.0–100.0)
Monocytes Absolute: 1 10*3/uL (ref 0.1–1.0)
Monocytes Absolute: 1 10*3/uL (ref 0.1–1.0)
Monocytes Relative: 8 %
Monocytes Relative: 7 %
Neutro Abs: 8.6 10*3/uL — ABNORMAL HIGH (ref 1.7–7.7)
Neutro Abs: 12 10*3/uL — ABNORMAL HIGH (ref 1.7–7.7)
Neutrophils Relative %: 76 %
Neutrophils Relative %: 84 %
Platelets: 301 10*3/uL (ref 150–400)
Platelets: 301 10*3/uL (ref 150–400)
RBC: 3.85 MIL/uL — ABNORMAL LOW (ref 4.22–5.81)
RBC: 3.86 MIL/uL — ABNORMAL LOW (ref 4.22–5.81)
RDW: 13 % (ref 11.5–15.5)
RDW: 13.1 % (ref 11.5–15.5)
WBC: 11.5 10*3/uL — ABNORMAL HIGH (ref 4.0–10.5)
WBC: 14.3 10*3/uL — ABNORMAL HIGH (ref 4.0–10.5)
nRBC: 0 % (ref 0.0–0.2)
nRBC: 0 % (ref 0.0–0.2)

## 2021-12-07 LAB — URINALYSIS, ROUTINE W REFLEX MICROSCOPIC
Bacteria, UA: NONE SEEN
Bilirubin Urine: NEGATIVE
Glucose, UA: NEGATIVE mg/dL
Hgb urine dipstick: NEGATIVE
Ketones, ur: 20 mg/dL — AB
Leukocytes,Ua: NEGATIVE
Nitrite: NEGATIVE
Protein, ur: 30 mg/dL — AB
Specific Gravity, Urine: 1.03 (ref 1.005–1.030)
pH: 5 (ref 5.0–8.0)

## 2021-12-07 LAB — BASIC METABOLIC PANEL
Anion gap: 7 (ref 5–15)
BUN: 7 mg/dL (ref 6–20)
CO2: 26 mmol/L (ref 22–32)
Calcium: 8.6 mg/dL — ABNORMAL LOW (ref 8.9–10.3)
Chloride: 105 mmol/L (ref 98–111)
Creatinine, Ser: 0.62 mg/dL (ref 0.61–1.24)
GFR, Estimated: >60 mL/min (ref >60)
Glucose, Bld: 142 mg/dL — ABNORMAL HIGH (ref 70–99)
Potassium: 3.5 mmol/L (ref 3.5–5.1)
Sodium: 138 mmol/L (ref 135–145)

## 2021-12-07 LAB — GLUCOSE, CAPILLARY
Glucose-Capillary: 128 mg/dL — ABNORMAL HIGH (ref 70–99)
Glucose-Capillary: 101 mg/dL — ABNORMAL HIGH (ref 70–99)
Glucose-Capillary: 93 mg/dL (ref 70–99)
Glucose-Capillary: 158 mg/dL — ABNORMAL HIGH (ref 70–99)

## 2021-12-07 LAB — COMPREHENSIVE METABOLIC PANEL
ALT: 23 U/L (ref 0–44)
AST: 17 U/L (ref 15–41)
Albumin: 3.5 g/dL (ref 3.5–5.0)
Alkaline Phosphatase: 55 U/L (ref 38–126)
Anion gap: 6 (ref 5–15)
BUN: 9 mg/dL (ref 6–20)
CO2: 25 mmol/L (ref 22–32)
Calcium: 8.6 mg/dL — ABNORMAL LOW (ref 8.9–10.3)
Chloride: 108 mmol/L (ref 98–111)
Creatinine, Ser: 0.61 mg/dL (ref 0.61–1.24)
GFR, Estimated: >60 mL/min (ref >60)
Glucose, Bld: 122 mg/dL — ABNORMAL HIGH (ref 70–99)
Potassium: 3.5 mmol/L (ref 3.5–5.1)
Sodium: 139 mmol/L (ref 135–145)
Total Bilirubin: 0.9 mg/dL (ref 0.3–1.2)
Total Protein: 6.3 g/dL — ABNORMAL LOW (ref 6.5–8.1)

## 2021-12-07 LAB — AMYLASE: Amylase: 56 U/L (ref 28–100)

## 2021-12-07 MED ORDER — PANTOPRAZOLE SODIUM 40 MG PO TBEC
40.0000 mg | DELAYED_RELEASE_TABLET | Freq: Every day | ORAL | Status: DC
  Administered 2021-12-07 – 2021-12-12 (×6): 40 mg via ORAL
  Filled 2021-12-07 (×6): qty 1

## 2021-12-07 MED ORDER — FERROUS GLUCONATE 324 (38 FE) MG PO TABS
324.0000 mg | ORAL_TABLET | Freq: Two times a day (BID) | ORAL | Status: DC
  Filled 2021-12-07: qty 1

## 2021-12-07 NOTE — Progress Notes (Addendum)
     Subjective: 3 Days Post-Op Procedure(s) (LRB): TRANFORAMINAL LUMBAR INTERBODY FUSIONS LEFT LUMBAR THREE - FOUR AND LEFT LUMBAR FOUR - FIVE WITH REMOVAL OF HARDWARE LUMBAR FIVE -SACRAL ONE, POSTERIOR INSTRUMENTATION LUMBAR THREE, LUMBAR FOUR, LUMBAR FIVE AND SACRAL ONE, RODS SCREWS AND CAGES, LOCAL AND ALLOGRAFT BONE GRAFT (N/A) Awake, alert and oriented x 4. Up with PT this AM. Had emesis last evening with bloating. BM this AM and he feels a little better. Will go with clear liquids and check a KUB and Amylase, CMET and UA. I suspect a problem of gastroparesis and some bowel motility decreased post op. Complains of increased left leg pain and weakness.  Patient reports pain as moderate.    Objective:   VITALS:  Temp:  [97.9 F (36.6 C)-99.2 F (37.3 C)] 98.7 F (37.1 C) (07/28 0738) Pulse Rate:  [86-95] 95 (07/28 0738) Resp:  [16] 16 (07/27 1939) BP: (120-128)/(64-71) 122/71 (07/28 0738) SpO2:  [93 %-96 %] 96 % (07/28 0738)  Neurologically intact ABD soft Neurovascular intact Sensation intact distally Intact pulses distally Dorsiflexion/Plantar flexion intact Incision: dressing C/D/I and moderate drainage   LABS Recent Labs    12/05/21 0335 12/07/21 0311  HGB 11.6* 12.3*  WBC 16.1* 11.5*  PLT 284 301   Recent Labs    12/05/21 0335 12/07/21 0311  NA 138 138  K 3.4* 3.5  CL 106 105  CO2 25 26  BUN 5* 7  CREATININE 0.56* 0.62  GLUCOSE 136* 142*   No results for input(s): "LABPT", "INR" in the last 72 hours.   Assessment/Plan: 3 Days Post-Op Procedure(s) (LRB): TRANFORAMINAL LUMBAR INTERBODY FUSIONS LEFT LUMBAR THREE - FOUR AND LEFT LUMBAR FOUR - FIVE WITH REMOVAL OF HARDWARE LUMBAR FIVE -SACRAL ONE, POSTERIOR INSTRUMENTATION LUMBAR THREE, LUMBAR FOUR, LUMBAR FIVE AND SACRAL ONE, RODS SCREWS AND CAGES, LOCAL AND ALLOGRAFT BONE GRAFT (N/A) Stomach uupset, nausea and emesis last evening will check for any LFT elevation or pancreatic enzyme changes and  electrolytes and trend for CBC, presently lab does not support dehydration or trend of WBC upwards, last CBC was with WBC count at 16 K now it is 11.6  that is trending downwards Incision is draining likely due to vancomycin powder.  Up with therapy Will wait on discharge planning until abdomenal difficulty resolves.   [name] 12/07/2021, 8:43 AM Patient ID: Scott Tucker, male   DOB: 10/13/1965, 56 y.o.   MRN: [id number]

## 2021-12-07 NOTE — Care Management Important Message (Signed)
Important Message  Patient Details  Name: Scott Tucker MRN: [id number] Date of Birth: [birth year]   Medicare Important Message Given:  Yes     Dorena Bodo 12/07/2021, 3:43 PM

## 2021-12-07 NOTE — Progress Notes (Signed)
OT Cancellation Note  Patient Details Name: Scott Tucker MRN: [id number] DOB: [birth year]   Cancelled Treatment:    Reason Eval/Treat Not Completed: Pain limiting ability to participate;Fatigue/lethargy limiting ability to participate;Other (comment) pt greeted in sidelying reporting pain and wanting to take a nap, will check back as time allows for OT session.  Lenor Derrick., COTA/L Acute Rehabilitation Services [phone]   Barron Schmid 12/07/2021, 11:18 AM

## 2021-12-07 NOTE — Plan of Care (Signed)
  Problem: Activity: Goal: Risk for activity intolerance will decrease Outcome: Progressing   Problem: Nutrition: Goal: Adequate nutrition will be maintained Outcome: Progressing   Problem: Safety: Goal: Ability to remain free from injury will improve Outcome: Progressing   Problem: Nutritional: Goal: Maintenance of adequate nutrition will improve Outcome: Progressing

## 2021-12-07 NOTE — Progress Notes (Addendum)
Physical Therapy Treatment Patient Details Name: Scott Tucker MRN: [id number] DOB: [birth year] Today's Date: 12/07/2021   History of Present Illness Pt is a 56 y/o male s/p L3-4, L4-5, S1 fusion performed on 12/04/21. Pt with hx of spinal surgeries reporting first one completed in 1992.    PT Comments    Pt received EOB. He just returned from bathroom. RN present in room. Pt reporting nausea and severe back. With encouragement, pt agreeable to gait trial. He required min assist transfers and min guard assist ambulation 45' with RW. In hallway, pt with c/o instability/weakness LLE and relays inability to continue amb back to room. Desk chair retrieved and pt assist stand to sit. Pt wheeled back to room in chair. Min assist sit to stand, and min assist sit to supine. Pt moves very slow, requiring increased time to complete all mobility. Dr. Otelia Sergeant present during session. Previous sessions, pt has been ambulating > 100'.     Recommendations for follow up therapy are one component of a multi-disciplinary discharge planning process, led by the attending physician.  Recommendations may be updated based on patient status, additional functional criteria and insurance authorization.  Follow Up Recommendations  Acute inpatient rehab (3hours/day)     Assistance Recommended at Discharge Intermittent Supervision/Assistance  Patient can return home with the following A little help with walking and/or transfers;Assistance with cooking/housework;Assist for transportation;A little help with bathing/dressing/bathroom;Help with stairs or ramp for entrance   Equipment Recommendations  Rolling walker (2 wheels);BSC/3in1 (consider 2 RW, one for upstairs and one for downstairs)    Recommendations for Other Services       Precautions / Restrictions Precautions Precautions: Back Precaution Comments: reviewed 3/3 back precautions Required Braces or Orthoses: Spinal Brace Spinal Brace: Applied in sitting  position     Mobility  Bed Mobility Overal bed mobility: Needs Assistance Bed Mobility: Sit to Sidelying, Rolling Rolling: Min assist       Sit to sidelying: Min assist General bed mobility comments: +rail, increased time, assist with BLE into bed    Transfers Overall transfer level: Needs assistance Equipment used: Rolling walker (2 wheels) Transfers: Sit to/from Stand Sit to Stand: Min assist           General transfer comment: increased time    Ambulation/Gait Ambulation/Gait assistance: Min guard Gait Distance (Feet): 45 Feet Assistive device: Rolling walker (2 wheels) Gait Pattern/deviations: Decreased stride length, Step-to pattern, Decreased weight shift to left       General Gait Details: extremely slow, guarded gait. Heavy reliance on RW. Pt reporting L knee/instability weakness. Unable to complete return amb to room. Required sitting in desk chair and being wheeled back to room.   Stairs             Wheelchair Mobility    Modified Rankin (Stroke Patients Only)       Balance Overall balance assessment: Mild deficits observed, not formally tested, History of Falls                                          Cognition Arousal/Alertness: Awake/alert Behavior During Therapy: WFL for tasks assessed/performed Overall Cognitive Status: Within Functional Limits for tasks assessed  Exercises      General Comments General comments (skin integrity, edema, etc.): Pt with c/o nausea and abdominal pain. At beginning of session, pt just returned to EOB with RN after BM in bathroom.      Pertinent Vitals/Pain Pain Assessment Pain Assessment: 0-10 Pain Score: 10-Worst pain ever (Pt reported pain as a 30.) Pain Location: lower back Pain Descriptors / Indicators: Grimacing, Guarding, Crying Pain Intervention(s): Monitored during session, Repositioned, Limited activity within  patient's tolerance, Patient requesting pain meds-RN notified    Home Living                          Prior Function            PT Goals (current goals can now be found in the care plan section) Acute Rehab PT Goals Patient Stated Goal: be able to walk without pain Progress towards PT goals: Progressing toward goals    Frequency           PT Plan Current plan remains appropriate    Co-evaluation              AM-PAC PT "6 Clicks" Mobility   Outcome Measure  Help needed turning from your back to your side while in a flat bed without using bedrails?: A Little Help needed moving from lying on your back to sitting on the side of a flat bed without using bedrails?: A Little Help needed moving to and from a bed to a chair (including a wheelchair)?: A Little Help needed standing up from a chair using your arms (e.g., wheelchair or bedside chair)?: A Little Help needed to walk in hospital room?: A Little Help needed climbing 3-5 steps with a railing? : A Lot 6 Click Score: 17    End of Session Equipment Utilized During Treatment: Gait belt;Back brace Activity Tolerance: Patient limited by pain Patient left: in bed;with call bell/phone within reach Nurse Communication: Mobility status;Patient requests pain meds PT Visit Diagnosis: Unsteadiness on feet (R26.81);Other abnormalities of gait and mobility (R26.89);Pain     Time: 0825-0906 PT Time Calculation (min) (ACUTE ONLY): 41 min  Charges:  $Gait Training: 38-52 mins                     Aida Raider, [place]  Office # [phone] Pager [phone]    Ilda Foil 12/07/2021, 9:08 AM

## 2021-12-07 NOTE — Progress Notes (Signed)
Patient ID: Scott Tucker, male   DOB: [birth year], 56 y.o.   MRN: [id number] KUB normal gas pattern for abdomen Lumbar spine radiographs with fusions at L3-4 and L4-5 no hardware or interbody cage abnormalities, no foramenal narrowing.  WBC is slightly elevated compared with last study. No fever CMET and amylase is normal. May advance diet as tolerated.

## 2021-12-07 NOTE — Progress Notes (Signed)
Inpatient Rehab Admissions Coordinator:  Continue to follow for medical clearance for possible CIR admission.     Wolfgang Phoenix, MS, CCC-SLP Admissions Coordinator [phone]

## 2021-12-07 NOTE — Progress Notes (Signed)
Occupational Therapy Treatment Patient Details Name: Scott Tucker MRN: [id number] DOB: 1965/08/23 Today's Date: 12/07/2021   History of present illness Pt is a 56 y/o male s/p L3-4, L4-5, S1 fusion performed on 12/04/21. Pt with hx of spinal surgeries reporting first one completed in 1992.   OT comments  Pt greeted in sidelying, hesitantly agreeable to OT intervention. Session focus on  functional mobility as precursor to higher level ADLS. Pt continues to be limited by increased back pain that radiates down to LLE. Pt currently requires min guard assist for ambulatory ADL transfers with RW with increased time and effort d/t pain ( very slow labored gait speed with difficulty advancing LLE during swing). Pt able to don brace with MIN A, pt would require total A at this time for LB ADLS. Pt would greatly benefit from CIR pending pt can tolerate longer sessions in prep for 3 hr requirement of therapy at CIR. Will continue to follow acutely and updated POC based on pt progress.                    Recommendations for follow up therapy are one component of a multi-disciplinary discharge planning process, led by the attending physician.  Recommendations may be updated based on patient status, additional functional criteria and insurance authorization.    Follow Up Recommendations  Acute inpatient rehab (3hours/day)    Assistance Recommended at Discharge Intermittent Supervision/Assistance  Patient can return home with the following  A lot of help with walking and/or transfers;A lot of help with bathing/dressing/bathroom;Assistance with cooking/housework;Assist for transportation;Help with stairs or ramp for entrance   Equipment Recommendations  BSC/3in1;Other (comment) (TBD)    Recommendations for Other Services      Precautions / Restrictions Precautions Precautions: Back Precaution Booklet Issued: No Precaution Comments: reviewed 3/3 back precautions Required Braces or Orthoses: Spinal  Brace Spinal Brace: Applied in sitting position Restrictions Weight Bearing Restrictions: No       Mobility Bed Mobility Overal bed mobility: Needs Assistance Bed Mobility: Rolling, Sidelying to Sit Rolling: Supervision Sidelying to sit: Min guard       General bed mobility comments: cues for sequencing log roll, increased time and effort and heavy use of bed rail    Transfers Overall transfer level: Needs assistance Equipment used: Rolling walker (2 wheels) Transfers: Sit to/from Stand Sit to Stand: Min guard, From elevated surface           General transfer comment: increased time and effort d/t pain     Balance Overall balance assessment: Needs assistance Sitting-balance support: Feet supported, No upper extremity supported Sitting balance-Leahy Scale: Fair Sitting balance - Comments: able to sit EOB to don brace with no LOB   Standing balance support: Reliant on assistive device for balance Standing balance-Leahy Scale: Poor                             ADL either performed or assessed with clinical judgement   ADL Overall ADL's : Needs assistance/impaired                 Upper Body Dressing : Minimal assistance;Sitting Upper Body Dressing Details (indicate cue type and reason): to don brace   Lower Body Dressing Details (indicate cue type and reason): reviewed ability to figure four however pt unable to perform at this time d/t pain Toilet Transfer: Min guard;Ambulation;Rolling walker (2 wheels) Toilet Transfer Details (indicate cue type and reason): simulated,  very slow labored gait d/t pain       Tub/Shower Transfer Details (indicate cue type and reason): pt reports tub shower at home, brief education on need for TTB after rehab Functional mobility during ADLs: Min guard;Rolling walker (2 wheels) General ADL Comments: ADL participation limited by pain    Extremity/Trunk Assessment Upper Extremity Assessment Upper Extremity  Assessment: Generalized weakness   Lower Extremity Assessment Lower Extremity Assessment: Defer to PT evaluation   Cervical / Trunk Assessment Cervical / Trunk Assessment: Back Surgery    Vision Baseline Vision/History: 0 No visual deficits Ability to See in Adequate Light: 0 Adequate Patient Visual Report: No change from baseline     Perception Perception Perception: Within Functional Limits   Praxis Praxis Praxis: Intact    Cognition Arousal/Alertness: Awake/alert Behavior During Therapy: WFL for tasks assessed/performed, Flat affect (keeping eyes closed during mobility) Overall Cognitive Status: Within Functional Limits for tasks assessed                                 General Comments: cooperative and following commands but cog limited by fixating on pain        Exercises      Shoulder Instructions       General Comments briefly discussed need for pt to be able to tolerate more therapy to be admitted to CIR    Pertinent Vitals/ Pain       Pain Assessment Pain Assessment: 0-10 Pain Score: 8  Pain Location: back; radiating to LLE Pain Descriptors / Indicators: Grimacing, Discomfort, Radiating Pain Intervention(s): Limited activity within patient's tolerance, Monitored during session, Repositioned, Premedicated before session  Home Living                                          Prior Functioning/Environment              Frequency  Min 3X/week        Progress Toward Goals  OT Goals(current goals can now be found in the care plan section)  Progress towards OT goals: Progressing toward goals  Acute Rehab OT Goals Patient Stated Goal: to have less pain OT Goal Formulation: With patient Time For Goal Achievement: 12/19/21 Potential to Achieve Goals: Fair  Plan Discharge plan remains appropriate;Frequency remains appropriate    Co-evaluation                 AM-PAC OT "6 Clicks" Daily Activity      Outcome Measure   Help from another person eating meals?: None Help from another person taking care of personal grooming?: A Little Help from another person toileting, which includes using toliet, bedpan, or urinal?: A Lot Help from another person bathing (including washing, rinsing, drying)?: A Lot Help from another person to put on and taking off regular upper body clothing?: A Little Help from another person to put on and taking off regular lower body clothing?: A Lot 6 Click Score: 16    End of Session Equipment Utilized During Treatment: Gait belt;Rolling walker (2 wheels);Back brace  OT Visit Diagnosis: Unsteadiness on feet (R26.81);Repeated falls (R29.6);Muscle weakness (generalized) (M62.81);Other symptoms and signs involving the nervous system (R29.898);Pain Pain - part of body:  (back; LLE)   Activity Tolerance Patient tolerated treatment well;Patient limited by pain   Patient Left in bed;with call bell/phone within  reach;with bed alarm set   Nurse Communication Mobility status        Time: 1411-1441 OT Time Calculation (min): 30 min  Charges: OT General Charges $OT Visit: 1 Visit OT Treatments $Self Care/Home Management : 8-22 mins $Therapeutic Activity: 8-22 mins Lenor Derrick., COTA/L Acute Rehabilitation Services [phone]   Barron Schmid 12/07/2021, 3:27 PM

## 2021-12-08 LAB — GLUCOSE, CAPILLARY
Glucose-Capillary: 109 mg/dL — ABNORMAL HIGH (ref 70–99)
Glucose-Capillary: 120 mg/dL — ABNORMAL HIGH (ref 70–99)
Glucose-Capillary: 135 mg/dL — ABNORMAL HIGH (ref 70–99)
Glucose-Capillary: 135 mg/dL — ABNORMAL HIGH (ref 70–99)

## 2021-12-08 MED ORDER — EMPAGLIFLOZIN 25 MG PO TABS
25.0000 mg | ORAL_TABLET | Freq: Every day | ORAL | Status: DC
  Administered 2021-12-09 – 2021-12-13 (×5): 25 mg via ORAL
  Filled 2021-12-08 (×5): qty 1

## 2021-12-08 NOTE — Progress Notes (Signed)
Physical Therapy Treatment Patient Details Name: Scott Tucker MRN: [id number] DOB: [birth year] Today's Date: 12/08/2021   History of Present Illness Pt is a 56 y/o male s/p L3-4, L4-5, S1 fusion performed on 12/04/21. Pt with hx of spinal surgeries reporting first one completed in 1992.    PT Comments    Pt received OOB in chair from recent session with OT.  Pt continues to be highly motivated to achieve goals and anticipate pt able to tolerate intensity of CIR at this time. Pt with increased ambulation tolerance, 110' this session with min guard for safety with no overt LOB noted, however gait continues to be slow and guarded secondary to pain with radiating symptoms and LLE weakness. Current plan remains appropriate to address deficits and maximize functional independence and decrease caregiver burden. Pt continues to benefit from skilled PT services to progress toward functional mobility goals.    Recommendations for follow up therapy are one component of a multi-disciplinary discharge planning process, led by the attending physician.  Recommendations may be updated based on patient status, additional functional criteria and insurance authorization.  Follow Up Recommendations  Acute inpatient rehab (3hours/day)     Assistance Recommended at Discharge Intermittent Supervision/Assistance  Patient can return home with the following A little help with walking and/or transfers;Assistance with cooking/housework;Assist for transportation;A little help with bathing/dressing/bathroom;Help with stairs or ramp for entrance   Equipment Recommendations  Rolling walker (2 wheels);BSC/3in1 (consider 2 RW, one for upstairs and one for downstairs)    Recommendations for Other Services Rehab consult     Precautions / Restrictions Precautions Precautions: Back Precaution Booklet Issued: No Precaution Comments: reviewed 3/3 back precautions Required Braces or Orthoses: Spinal Brace Spinal Brace:  Applied in sitting position Restrictions Weight Bearing Restrictions: No     Mobility  Bed Mobility Overal bed mobility: Needs Assistance             General bed mobility comments: pt OOB in recliner on arrival    Transfers Overall transfer level: Needs assistance Equipment used: Rolling walker (2 wheels) Transfers: Sit to/from Stand Sit to Stand: Min guard           General transfer comment: increased time and effort d/t pain    Ambulation/Gait Ambulation/Gait assistance: Min guard Gait Distance (Feet): 105 Feet Assistive device: Rolling walker (2 wheels) Gait Pattern/deviations: Decreased stride length, Step-to pattern, Decreased weight shift to left Gait velocity: decr     General Gait Details: slow, guarded gait. Heavy reliance on RW. no LOB, pt setting goal and reaching it this session, reamins motivated   Social research officer, government Rankin (Stroke Patients Only)       Balance Overall balance assessment: Needs assistance Sitting-balance support: Feet supported, No upper extremity supported Sitting balance-Leahy Scale: Fair Sitting balance - Comments: able to sit EOB to don brace with no LOB   Standing balance support: Reliant on assistive device for balance Standing balance-Leahy Scale: Poor                              Cognition Arousal/Alertness: Awake/alert Behavior During Therapy: WFL for tasks assessed/performed Overall Cognitive Status: Within Functional Limits for tasks assessed                                 General  Comments: motivated to progress this session, less fixation on pain when chatting about hobbies pt laughing and conversative        Exercises      General Comments General comments (skin integrity, edema, etc.): pt motivated for session, setting ambulation goal and meeting it, pt appreciaitve at end of session      Pertinent Vitals/Pain Pain Assessment Pain  Assessment: Faces Faces Pain Scale: Hurts whole lot Pain Location: back; radiating to LLE Pain Descriptors / Indicators: Grimacing, Discomfort, Radiating Pain Intervention(s): Monitored during session, Limited activity within patient's tolerance    Home Living                          Prior Function            PT Goals (current goals can now be found in the care plan section) Acute Rehab PT Goals Patient Stated Goal: be able to walk without pain PT Goal Formulation: With patient Time For Goal Achievement: 12/19/21    Frequency    Min 5X/week      PT Plan Current plan remains appropriate    Co-evaluation              AM-PAC PT "6 Clicks" Mobility   Outcome Measure  Help needed turning from your back to your side while in a flat bed without using bedrails?: A Little Help needed moving from lying on your back to sitting on the side of a flat bed without using bedrails?: A Little Help needed moving to and from a bed to a chair (including a wheelchair)?: A Little Help needed standing up from a chair using your arms (e.g., wheelchair or bedside chair)?: A Little Help needed to walk in hospital room?: A Little Help needed climbing 3-5 steps with a railing? : A Lot 6 Click Score: 17    End of Session Equipment Utilized During Treatment: Back brace Activity Tolerance: Patient limited by pain Patient left: with call bell/phone within reach;in chair;with chair alarm set Nurse Communication: Mobility status PT Visit Diagnosis: Unsteadiness on feet (R26.81);Other abnormalities of gait and mobility (R26.89);Pain Pain - Right/Left:  (Low back)     Time: 1610-9604 PT Time Calculation (min) (ACUTE ONLY): 42 min  Charges:  $Gait Training: 23-37 mins $Therapeutic Activity: 8-22 mins                     Temitayo Covalt R. PTA Acute Rehabilitation Services Office: [phone]    Catalina Antigua 12/08/2021, 12:36 PM

## 2021-12-08 NOTE — Progress Notes (Signed)
     Subjective: 4 Days Post-Op Procedure(s) (LRB): TRANFORAMINAL LUMBAR INTERBODY FUSIONS LEFT LUMBAR THREE - FOUR AND LEFT LUMBAR FOUR - FIVE WITH REMOVAL OF HARDWARE LUMBAR FIVE -SACRAL ONE, POSTERIOR INSTRUMENTATION LUMBAR THREE, LUMBAR FOUR, LUMBAR FIVE AND SACRAL ONE, RODS SCREWS AND CAGES, LOCAL AND ALLOGRAFT BONE GRAFT (N/A) Awake, alert and oriented x 4. He is lying on right side in bed when prompted he is able to  Independently go from lying down to sitting at bedside to void and use urinal. Advised to work with PT and OT as this is why he is still in patient. Slight feeling of chills. No incentive spirometry at bedside, was a 5-6 hours surgery. No bowel movement today but voiding well. Blood sugars are stable and dressing is changed today with moderate saturation, incision is  Dry with no active drainage when assessing.  He is somewhat hypersensitive to painting the incision with betadien. No headaches.  Patient reports pain as moderate.    Objective:   VITALS:  Temp:  [98.1 F (36.7 C)-98.6 F (37 C)] 98.5 F (36.9 C) (07/29 0741) Pulse Rate:  [59-97] 78 (07/29 0741) Resp:  [15-20] 20 (07/29 0449) BP: (100-127)/(54-86) 116/62 (07/29 0741) SpO2:  [96 %-98 %] 98 % (07/29 0741)  Neurologically intact ABD soft Neurovascular intact Sensation intact distally Intact pulses distally Dorsiflexion/Plantar flexion intact Incision: moderate drainage and dressing changed with abd, 4x4 and opsite.   LABS Recent Labs    12/07/21 0311 12/07/21 1054  HGB 12.3* 12.4*  WBC 11.5* 14.3*  PLT 301 301   Recent Labs    12/07/21 0311 12/07/21 1054  NA 138 139  K 3.5 3.5  CL 105 108  CO2 26 25  BUN 7 9  CREATININE 0.62 0.61  GLUCOSE 142* 122*   No results for input(s): "LABPT", "INR" in the last 72 hours.   Assessment/Plan: 4 Days Post-Op Procedure(s) (LRB): TRANFORAMINAL LUMBAR INTERBODY FUSIONS LEFT LUMBAR THREE - FOUR AND LEFT LUMBAR FOUR - FIVE WITH REMOVAL OF  HARDWARE LUMBAR FIVE -SACRAL ONE, POSTERIOR INSTRUMENTATION LUMBAR THREE, LUMBAR FOUR, LUMBAR FIVE AND SACRAL ONE, RODS SCREWS AND CAGES, LOCAL AND ALLOGRAFT BONE GRAFT (N/A) Elevation of WBC Needs incentive spirometry PT and OT, no help at home so needs independent  prior to returning home, SNF vs CIR.  Advance diet Up with therapy D/C IV fluids Discharge to SNF Recheck lab in AM Start incentive spirometry Assisted ambulation TID with nursing.   [name] 12/08/2021, 8:42 AM Patient ID: Scott Tucker, male   DOB: 30-May-1965, 56 y.o.   MRN: [id number]

## 2021-12-08 NOTE — PMR Pre-admission (Signed)
PMR Admission Coordinator Pre-Admission Assessment  Patient: Scott Tucker is an 56 y.o., male MRN: [id number] DOB: 1965/10/13 Height: 5' 6"  (167.6 cm) Weight: 86.2 kg  Insurance Information HMO: yes    PPO:      PCP:      IPA:      80/20:      OTHER:  PRIMARY: [employer] Medicare  Policy#: [id number]      Subscriber: patient CM Name: ***      Phone#: ***     Fax#: [phone] Pre-Cert#: ***      Employer: *** Benefits:  Phone #: [phone]/WU [phone]     Name:  Eff. Date: 07/11/21     Deduct: $0 (does not have) Out of Pocket Max: $8,300 ($264.60 met) Life Max: NA CIR: 100% coverage for days 1-60, $400 co-pay/day for days 61-90, $800 co-pay for days 91-150      SNF: 100% coverage for days 1-20, $200 co-pay/day for days 21-100 Outpatient: 80% coverage     Co-Pay: 20% co-insurance Home Health: 100% coverage      Co-Pay:  DME: 80% coverage     Co-Pay: 20% co-insurance Providers: in-network SECONDARY: Medicaid [place] Access    Policy#: [id number]     Phone#: [phone]  Financial Counselor:       Phone#:   The "Data Collection Information Summary" for patients in Inpatient Rehabilitation Facilities with attached "Privacy Act [place] Records" was provided and verbally reviewed with: {CHL IP Patient Family VF:[id number]}  Emergency Contact Information Contact Information     Name Relation Home Work Mobile   BERNARD,[name] Significant other   [phone]       Current Medical History  Patient Admitting Diagnosis: lumbar spondylolisthesis, s/p lumbar spinal fusion History of Present Illness: Pt is a 56 year old male with medical hx significant for: multilevel lumbar stenosis, HTN, neuromuscular disorder, CAD, anxiety, DM II, arthritis.  Pt presented to [facility] on 12/04[facility]21 for surgery d/t lumbar spondylolisthesis. Pt underwent L3-4, L4-5, L5-S1 fusion. Pt with emesis the night of 7/27. KUB was unremarkable. Therapy evaluations completed and CIR  recommended d/t pt's deficits in functional mobility and inability to complete ADLs independently.    Patient's medical record from [facility] has been reviewed by the rehabilitation admission coordinator and physician.  Past Medical History  Past Medical History:  Diagnosis Date   Anginal pain ([place])    Anxiety    Arthritis    Chronic back pain    Coronary artery disease    Diabetes mellitus without complication ([place])    type 2   Dyspnea    Headache    HTN (hypertension)    Neuromuscular disorder ([place])     Has the patient had major surgery during 100 days prior to admission? Yes  Family History   family history includes Heart disease in his maternal uncle.  Current Medications  Current Facility-Administered Medications:    0.9 %  sodium chloride infusion, 250 mL, Intravenous, Continuous, Louanne Skye, Daleen Bo, MD   0.9 %  sodium chloride infusion, , Intravenous, Continuous, Jessy Oto, MD   acetaminophen (TYLENOL) tablet 650 mg, 650 mg, Oral, Q4H PRN, 650 mg at 12/09/21 2021 **OR** acetaminophen (TYLENOL) suppository 650 mg, 650 mg, Rectal, Q4H PRN, Jessy Oto, MD   alum & mag hydroxide-simeth (MAALOX/MYLANTA) 200-200-20 MG/5ML suspension 30 mL, 30 mL, Oral, Q6H PRN, Jessy Oto, MD   amLODipine (NORVASC) tablet 10 mg, 10 mg, Oral, Daily, Jessy Oto, MD, 10 mg  at 12/10/21 0848   aspirin EC tablet 81 mg, 81 mg, Oral, Daily, Jessy Oto, MD, 81 mg at 12/10/21 0847   atorvastatin (LIPITOR) tablet 40 mg, 40 mg, Oral, Daily, Jessy Oto, MD, 40 mg at 12/10/21 0848   bisacodyl (DULCOLAX) EC tablet 5 mg, 5 mg, Oral, Daily PRN, Jessy Oto, MD, 5 mg at 12/07/21 0529   docusate sodium (COLACE) capsule 100 mg, 100 mg, Oral, BID, Jessy Oto, MD, 100 mg at 12/10/21 0846   empagliflozin (JARDIANCE) tablet 25 mg, 25 mg, Oral, Q breakfast, Jessy Oto, MD, 25 mg at 12/10/21 0847   HYDROcodone-acetaminophen (NORCO) 7.5-325 MG per tablet 1 tablet, 1 tablet,  Oral, Q6H, Jessy Oto, MD, 1 tablet at 12/10/21 3716   HYDROcodone-acetaminophen (NORCO) 7.5-325 MG per tablet 1 tablet, 1 tablet, Oral, Q4H PRN, Jessy Oto, MD, 1 tablet at 12/10/21 9678   HYDROcodone-acetaminophen (NORCO) 7.5-325 MG per tablet 2 tablet, 2 tablet, Oral, Q4H PRN, Jessy Oto, MD, 2 tablet at 12/09/21 2104   HYDROmorphone (DILAUDID) injection 1 mg, 1 mg, Intravenous, Q4H PRN, Garald Balding, MD, 1 mg at 12/10/21 0842   insulin aspart (novoLOG) injection 0-15 Units, 0-15 Units, Subcutaneous, TID WC, Jessy Oto, MD, 3 Units at 12/09/21 0828   insulin aspart (novoLOG) injection 4 Units, 4 Units, Subcutaneous, TID WC, Jessy Oto, MD, 4 Units at 12/10/21 0843   loratadine (CLARITIN) tablet 10 mg, 10 mg, Oral, Daily, Jessy Oto, MD, 10 mg at 12/10/21 0846   losartan (COZAAR) tablet 25 mg, 25 mg, Oral, Daily, Jessy Oto, MD, 25 mg at 12/10/21 0847   menthol-cetylpyridinium (CEPACOL) lozenge 3 mg, 1 lozenge, Oral, PRN **OR** phenol (CHLORASEPTIC) mouth spray 1 spray, 1 spray, Mouth/Throat, PRN, Jessy Oto, MD   methocarbamol (ROBAXIN) tablet 500 mg, 500 mg, Oral, Q6H PRN, 500 mg at 12/10/21 0204 **OR** methocarbamol (ROBAXIN) 500 mg in dextrose 5 % 50 mL IVPB, 500 mg, Intravenous, Q6H PRN, Jessy Oto, MD   morphine (PF) 2 MG/ML injection 1 mg, 1 mg, Intravenous, Q2H PRN, Jessy Oto, MD, 1 mg at 12/10/21 0204   nitroGLYCERIN (NITROSTAT) SL tablet 0.4 mg, 0.4 mg, Sublingual, Q5 min PRN, Jessy Oto, MD   ondansetron (ZOFRAN) tablet 4 mg, 4 mg, Oral, Q6H PRN **OR** ondansetron (ZOFRAN) injection 4 mg, 4 mg, Intravenous, Q6H PRN, Jessy Oto, MD, 4 mg at 12/06/21 2022   pantoprazole (PROTONIX) EC tablet 40 mg, 40 mg, Oral, QHS, Skeet Simmer, RPH, 40 mg at 12/09/21 2105   polyethylene glycol (MIRALAX / GLYCOLAX) packet 17 g, 17 g, Oral, Daily PRN, Jessy Oto, MD, 17 g at 12/07/21 0530   predniSONE (STERAPRED UNI-PAK 21 TAB) tablet 10 mg, 10  mg, Oral, 3 x daily with food, Jessy Oto, MD   [START ON 12/11/2021] predniSONE (STERAPRED UNI-PAK 21 TAB) tablet 10 mg, 10 mg, Oral, 4X daily taper, Jessy Oto, MD   predniSONE (STERAPRED UNI-PAK 21 TAB) tablet 20 mg, 20 mg, Oral, Nightly, Jessy Oto, MD   sodium chloride flush (NS) 0.9 % injection 3 mL, 3 mL, Intravenous, Q12H, Jessy Oto, MD, 3 mL at 12/10/21 0842   sodium chloride flush (NS) 0.9 % injection 3 mL, 3 mL, Intravenous, PRN, Jessy Oto, MD   sodium phosphate (FLEET) 7-19 GM/118ML enema 1 enema, 1 enema, Rectal, Once PRN, Jessy Oto, MD   traZODone (DESYREL) tablet 25-50 mg, 25-50  mg, Oral, QHS PRN, Jessy Oto, MD, 50 mg at 12/09/21 2105   Vitamin D (Ergocalciferol) (DRISDOL) capsule 50,000 Units, 50,000 Units, Oral, Weekly, Jessy Oto, MD, 50,000 Units at 12/05/21 0810  Patients Current Diet:  Diet Order             Diet - low sodium heart healthy           Diet Carb Modified Fluid consistency: Thin; Room service appropriate? Yes  Diet effective now                   Precautions / Restrictions Precautions Precautions: Back Precaution Booklet Issued: No Precaution Comments: reviewed 3/3 back precautions Spinal Brace: Applied in sitting position, Lumbar corset Restrictions Weight Bearing Restrictions: No   Has the patient had 2 or more falls or a fall with injury in the past year? Yes  Prior Activity Level Community (5-7x/wk): drives, gets out of house 3-4 days/week  Prior Functional Level Self Care: Did the patient need help bathing, dressing, using the toilet or eating? Needed some help  Indoor Mobility: Did the patient need assistance with walking from room to room (with or without device)? Independent  Stairs: Did the patient need assistance with internal or external stairs (with or without device)? Independent  Functional Cognition: Did the patient need help planning regular tasks such as shopping or remembering to take  medications? Needed some help  Patient Information Are you of Hispanic, Latino/a,or Spanish origin?: A. No, not of Hispanic, Latino/a, or Spanish origin What is your race?: B. Black or African American Do you need or want an interpreter to communicate with a doctor or health care staff?: 0. No  Patient's Response To:  Health Literacy and Transportation Is the patient able to respond to health literacy and transportation needs?: Yes Health Literacy - How often do you need to have someone help you when you read instructions, pamphlets, or other written material from your doctor or pharmacy?: Never (can read the pamphlets but doesn't always understand it or remember the information) In the past 12 months, has lack of transportation kept you from medical appointments or from getting medications?: Yes (girlfriend provides transportation at times. when she's not available he has missed appointments) In the past 12 months, has lack of transportation kept you from meetings, work, or from getting things needed for daily living?: Yes (girlfriend provides transportation. when she's not available, he has missed appointements)  [place] / [place] Devices/Equipment: None Home Equipment: Cane - single point  Prior Device Use: Indicate devices/aids used by the patient prior to current illness, exacerbation or injury?  cane  Current Functional Level Cognition  Overall Cognitive Status: Within Functional Limits for tasks assessed Orientation Level: Oriented X4 General Comments: motivated to progress this session, less fixation on pain when chatting about hobbies pt laughing and conversative    Extremity Assessment (includes Sensation/Coordination)  Upper Extremity Assessment: Overall WFL for tasks assessed  Lower Extremity Assessment: Defer to PT evaluation    ADLs  Overall ADL's : Needs assistance/impaired Eating/Feeding: Modified independent, Bed level Grooming:  Modified independent, Sitting, Oral care, Wash/dry face, Wash/dry hands Grooming Details (indicate cue type and reason): preferred to perform task sitting at sink rather than standing d/t pain. discussed home modiifications/setup to allow energy conservation at home as well Upper Body Bathing: Minimal assistance, Sitting Lower Body Bathing: Total assistance, Adhering to back precautions, Sit to/from stand Upper Body Dressing : Minimal assistance, Sitting Upper Body Dressing  Details (indicate cue type and reason): to don brace Lower Body Dressing: Moderate assistance, Sit to/from stand, Cueing for back precautions Lower Body Dressing Details (indicate cue type and reason): reviewed figure four position for LB dressing with pt typically pulling on compression stockings to lift LE. able to use socks to pull LE up 75% of the way though painful. may benefit from AE education Toilet Transfer: Min guard, Ambulation, Rolling walker (2 wheels) Toilet Transfer Details (indicate cue type and reason): simulated, very slow labored gait d/t pain-ambulated into b.room but did not need to use it-seated at sink for grooming tasks Toileting- Clothing Manipulation and Hygiene: Total assistance, Adhering to back precautions, Sit to/from stand Tub/Shower Transfer Details (indicate cue type and reason): pt reports tub shower at home, brief education on need for TTB after rehab Functional mobility during ADLs: Min guard, Rolling walker (2 wheels) General ADL Comments: pt able to don LSO brace with setup sitting EOB. Emphasis on back precautions, activity pacing (d/t pain), and gradual progression of strategies for balance    Mobility  Overal bed mobility: Needs Assistance Bed Mobility: Rolling, Sidelying to Sit Rolling: Modified independent (Device/Increase time) Sidelying to sit: Supervision Supine to sit: HOB elevated, Min assist Sit to sidelying: Min assist General bed mobility comments: cues for log rolling and  good carryover    Transfers  Overall transfer level: Needs assistance Equipment used: Rolling walker (2 wheels) Transfers: Sit to/from Stand Sit to Stand: Min guard Bed to/from chair/wheelchair/BSC transfer type:: Step pivot Step pivot transfers: Min assist, From elevated surface General transfer comment: increased time to stand with RW from bedside, BSC (seated at sink) with good hand placement. minor cues for keeping spine straight    Ambulation / Gait / Stairs / Wheelchair Mobility  Ambulation/Gait Ambulation/Gait assistance: Counsellor (Feet): 105 Feet Assistive device: Rolling walker (2 wheels) Gait Pattern/deviations: Decreased stride length, Step-to pattern, Decreased weight shift to left General Gait Details: slow, guarded gait. Heavy reliance on RW. no LOB, pt setting goal and reaching it this session, reamins motivated Gait velocity: decr Gait velocity interpretation: <1.31 ft/sec, indicative of household ambulator    Posture / Balance Dynamic Sitting Balance Sitting balance - Comments: able to sit EOB to don brace with no LOB Balance Overall balance assessment: Needs assistance Sitting-balance support: Feet supported, No upper extremity supported Sitting balance-Leahy Scale: Fair Sitting balance - Comments: able to sit EOB to don brace with no LOB Standing balance support: Bilateral upper extremity supported, During functional activity Standing balance-Leahy Scale: Poor Standing balance comment: reliant on at least one UE support in standing    Special needs/care consideration Continuous Drip IV  0.9% sodium chloride infusion, Skin Surgical incision: back, and Diabetic management novoLOG 4 units 3x daily at meals; novoLOG 0-15 units 3x daily at meals   Previous Home Environment (from acute therapy documentation) Living Arrangements: Alone Available Help at Discharge: Available PRN/intermittently (girlfriend) Type of Home: Other(Comment) (condo) Home  Layout: 1/2 bath on main level Home Access: Stairs to enter (20-25 steps to get to bed/bath upstairs) Entrance Stairs-Rails: None Entrance Stairs-Number of Steps: 5 Bathroom Shower/Tub: Chiropodist: Standard Bathroom Accessibility: Yes (Upstairs bathroom is walker accessible. 1/2 bath is not) How Accessible: Accessible via walker Home Care Services: No  Discharge Living Setting Plans for Discharge Living Setting: Patient's home Type of Home at Discharge: Other (Comment) (condo) Discharge Home Layout: 1/2 bath on main level Discharge Home Access: Stairs to enter Entrance Stairs-Rails: None Entrance Stairs-Number  of Steps: 5 Discharge Bathroom Shower/Tub: Tub/shower unit Discharge Bathroom Toilet: Standard Discharge Bathroom Accessibility: Yes How Accessible: Accessible via walker Does the patient have any problems obtaining your medications?: No  Social/Family/Support Systems Anticipated Caregiver: Rodolph Bong, girlfriend Anticipated Caregiver's Contact Information: [phone] Caregiver Availability: Intermittent Discharge Plan Discussed with Primary Caregiver: Yes Is Caregiver In Agreement with Plan?: Yes Does Caregiver/Family have Issues with Lodging/Transportation while Pt is in Rehab?: No  Goals Patient/Family Goal for Rehab: Supervision: PT/OT Expected length of stay: 7-10 days Pt/Family Agrees to Admission and willing to participate: Yes Program Orientation Provided & Reviewed with Pt/Caregiver Including Roles  & Responsibilities: Yes  Decrease burden of Care through IP rehab admission: NA  Possible need for SNF placement upon discharge: Not anticipated  Patient Condition: I have reviewed medical records from [facility], spoken with CM, and patient, girlfriend. I met with patient at the bedside for inpatient rehabilitation assessment.  Patient will benefit from ongoing PT and OT, can actively participate in 3 hours of therapy a day 5  days of the week, and can make measurable gains during the admission.  Patient will also benefit from the coordinated team approach during an Inpatient Acute Rehabilitation admission.  The patient will receive intensive therapy as well as Rehabilitation physician, nursing, social worker, and care management interventions.  Due to safety, skin/wound care, disease management, medication administration, pain management, and patient education the patient requires 24 hour a day rehabilitation nursing.  The patient is currently *** with mobility and basic ADLs.  Discharge setting and therapy post discharge at home with home health is anticipated.  Patient has agreed to participate in the Acute Inpatient Rehabilitation Program and will admit {Time; today/tomorrow:10263}.  Preadmission Screen Completed By:  Bethel Born, 12/10/2021 9:15 AM ______________________________________________________________________   Discussed status with Dr. Marland Kitchen on *** at *** and received approval for admission today.  Admission Coordinator:  Bethel Born, CCC-SLP, time ***/Date ***   Assessment/Plan: Diagnosis: Does the need for close, 24 hr/day Medical supervision in concert with the patient's rehab needs make it unreasonable for this patient to be served in a less intensive setting? {yes_no_potentially:3041433} Co-Morbidities requiring supervision/potential complications: *** Due to {due CB:6384536}, does the patient require 24 hr/day rehab nursing? {yes_no_potentially:3041433} Does the patient require coordinated care of a physician, rehab nurse, PT, OT, and SLP to address physical and functional deficits in the context of the above medical diagnosis(es)? {yes_no_potentially:3041433} Addressing deficits in the following areas: {deficits:3041436} Can the patient actively participate in an intensive therapy program of at least 3 hrs of therapy 5 days a week? {yes_no_potentially:3041433} The potential for  patient to make measurable gains while on inpatient rehab is {potential:3041437} Anticipated functional outcomes upon discharge from inpatient rehab: {functional outcomes:304600100} PT, {functional outcomes:304600100} OT, {functional outcomes:304600100} SLP Estimated rehab length of stay to reach the above functional goals is: *** Anticipated discharge destination: {anticipated dc setting:21604} 10. Overall Rehab/Functional Prognosis: {potential:3041437}   MD Signature: ***

## 2021-12-08 NOTE — Progress Notes (Signed)
Inpatient Rehab Admissions Coordinator:  Spoke with Rodell Perna, pt's girlfriend. She confirmed that she and pt's family will be able to provide 24/7 support for pt after discharge. Continue to await medical clearance for potential CIR admission. Will continue to follow.   Wolfgang Phoenix, MS, CCC-SLP Admissions Coordinator [phone]

## 2021-12-08 NOTE — Progress Notes (Signed)
Occupational Therapy Treatment Patient Details Name: Scott Tucker MRN: [id number] DOB: 1965-10-09 Today's Date: 12/08/2021   History of present illness Pt is a 56 y/o male s/p L3-4, L4-5, S1 fusion performed on 12/04/21. Pt with hx of spinal surgeries reporting first one completed in 1992.   OT comments  Pt. Seen for skilled OT treatment.  Remains in pain reported greater than a 10 but extremely motivated and agreeable to participation.  Able to ambulate around bed to bathroom.  Completed seated grooming tasks with set up.  Increased mobility distance and activity tolerance today.  Requested PT session at end of OT session demonstrating initiative and ability to tolerate longer therapy sessions. Remains excellent AIR candidate to achieve greater safety and independence prior to home.     Recommendations for follow up therapy are one component of a multi-disciplinary discharge planning process, led by the attending physician.  Recommendations may be updated based on patient status, additional functional criteria and insurance authorization.    Follow Up Recommendations  Acute inpatient rehab (3hours/day)    Assistance Recommended at Discharge Intermittent Supervision/Assistance  Patient can return home with the following  A lot of help with walking and/or transfers;A lot of help with bathing/dressing/bathroom;Assistance with cooking/housework;Assist for transportation;Help with stairs or ramp for entrance   Equipment Recommendations  BSC/3in1;Other (comment)    Recommendations for Other Services Rehab consult    Precautions / Restrictions Precautions Precautions: Back Precaution Booklet Issued: No Precaution Comments: reviewed 3/3 back precautions Required Braces or Orthoses: Spinal Brace Spinal Brace: Applied in sitting position Restrictions Weight Bearing Restrictions: No       Mobility Bed Mobility               General bed mobility comments: seated eob upon arrival  to room    Transfers   Equipment used: Rolling walker (2 wheels) Transfers: Sit to/from Stand, Bed to chair/wheelchair/BSC Sit to Stand: Min guard     Step pivot transfers: Min assist, From elevated surface     General transfer comment: increased time and effort d/t pain-education on rw management and using ues to aide in mobility. pt. c/o LLE not being able to actively initiate a step when he is trying to     Balance                                           ADL either performed or assessed with clinical judgement   ADL Overall ADL's : Needs assistance/impaired     Grooming: Wash/dry hands;Wash/dry face;Oral care;Set up;Sitting                   Toilet Transfer: Min guard;Ambulation;Rolling walker (2 wheels) Toilet Transfer Details (indicate cue type and reason): simulated, very slow labored gait d/t pain-ambulated into b.room but did not need to use it-seated at sink for grooming tasks           General ADL Comments: improved mobility today-able to ambulate to b.room and complete seated grooming tasks    Extremity/Trunk Assessment              Vision       Perception     Praxis      Cognition Arousal/Alertness: Awake/alert Behavior During Therapy: WFL for tasks assessed/performed, Flat affect Overall Cognitive Status: Within Functional Limits for tasks assessed  Exercises      Shoulder Instructions       General Comments pt motivated for session, setting ambulation goal and meeting it, pt appreciaitve at end of session    Pertinent Vitals/ Pain       Pain Assessment Pain Score: 10-Worst pain ever Pain Location: back; radiating to LLE Pain Descriptors / Indicators: Grimacing, Discomfort, Radiating Pain Intervention(s): Limited activity within patient's tolerance, Repositioned, Monitored during session  Home Living                                           Prior Functioning/Environment              Frequency  Min 3X/week        Progress Toward Goals  OT Goals(current goals can now be found in the care plan section)  Progress towards OT goals: Progressing toward goals     Plan Discharge plan remains appropriate;Frequency remains appropriate    Co-evaluation                 AM-PAC OT "6 Clicks" Daily Activity     Outcome Measure   Help from another person eating meals?: None Help from another person taking care of personal grooming?: A Little Help from another person toileting, which includes using toliet, bedpan, or urinal?: A Lot Help from another person bathing (including washing, rinsing, drying)?: A Lot Help from another person to put on and taking off regular upper body clothing?: A Little Help from another person to put on and taking off regular lower body clothing?: A Lot 6 Click Score: 16    End of Session Equipment Utilized During Treatment: Gait belt;Rolling walker (2 wheels)  OT Visit Diagnosis: Unsteadiness on feet (R26.81);Repeated falls (R29.6);Muscle weakness (generalized) (M62.81);Other symptoms and signs involving the nervous system (R29.898);Pain   Activity Tolerance Patient tolerated treatment well;Patient limited by pain   Patient Left in chair;with call bell/phone within reach;with chair alarm set   Nurse Communication          Time: 3790-2409 OT Time Calculation (min): 35 min  Charges: OT General Charges $OT Visit: 1 Visit OT Treatments $Self Care/Home Management : 23-37 mins  Scott Tucker, COTA/L Acute Rehabilitation [phone]   Scott Tucker 12/08/2021, 12:43 PM

## 2021-12-08 NOTE — Plan of Care (Signed)

## 2021-12-09 LAB — GLUCOSE, CAPILLARY
Glucose-Capillary: 161 mg/dL — ABNORMAL HIGH (ref 70–99)
Glucose-Capillary: 114 mg/dL — ABNORMAL HIGH (ref 70–99)
Glucose-Capillary: 108 mg/dL — ABNORMAL HIGH (ref 70–99)
Glucose-Capillary: 110 mg/dL — ABNORMAL HIGH (ref 70–99)

## 2021-12-09 LAB — BASIC METABOLIC PANEL
Anion gap: 8 (ref 5–15)
BUN: 10 mg/dL (ref 6–20)
CO2: 26 mmol/L (ref 22–32)
Calcium: 8.6 mg/dL — ABNORMAL LOW (ref 8.9–10.3)
Chloride: 105 mmol/L (ref 98–111)
Creatinine, Ser: 0.65 mg/dL (ref 0.61–1.24)
GFR, Estimated: >60 mL/min (ref >60)
Glucose, Bld: 131 mg/dL — ABNORMAL HIGH (ref 70–99)
Potassium: 3.6 mmol/L (ref 3.5–5.1)
Sodium: 139 mmol/L (ref 135–145)

## 2021-12-09 LAB — CBC WITH DIFFERENTIAL/PLATELET
Abs Immature Granulocytes: 0.05 10*3/uL (ref 0.00–0.07)
Basophils Absolute: 0 10*3/uL (ref 0.0–0.1)
Basophils Relative: 0 %
Eosinophils Absolute: 0.3 10*3/uL (ref 0.0–0.5)
Eosinophils Relative: 3 %
HCT: 33.9 % — ABNORMAL LOW (ref 39.0–52.0)
Hemoglobin: 11.7 g/dL — ABNORMAL LOW (ref 13.0–17.0)
Immature Granulocytes: 0 %
Lymphocytes Relative: 24 %
Lymphs Abs: 2.9 10*3/uL (ref 0.7–4.0)
MCH: 31.5 pg (ref 26.0–34.0)
MCHC: 34.5 g/dL (ref 30.0–36.0)
MCV: 91.4 fL (ref 80.0–100.0)
Monocytes Absolute: 0.9 10*3/uL (ref 0.1–1.0)
Monocytes Relative: 7 %
Neutro Abs: 8.2 10*3/uL — ABNORMAL HIGH (ref 1.7–7.7)
Neutrophils Relative %: 66 %
Platelets: 353 10*3/uL (ref 150–400)
RBC: 3.71 MIL/uL — ABNORMAL LOW (ref 4.22–5.81)
RDW: 12.8 % (ref 11.5–15.5)
WBC: 12.4 10*3/uL — ABNORMAL HIGH (ref 4.0–10.5)
nRBC: 0 % (ref 0.0–0.2)

## 2021-12-09 MED ORDER — PREDNISONE 10 MG (21) PO TBPK
20.0000 mg | ORAL_TABLET | Freq: Every morning | ORAL | Status: AC
  Administered 2021-12-09: 20 mg via ORAL
  Filled 2021-12-09: qty 21

## 2021-12-09 MED ORDER — PREDNISONE 10 MG (21) PO TBPK
10.0000 mg | ORAL_TABLET | ORAL | Status: AC
  Administered 2021-12-09: 10 mg via ORAL

## 2021-12-09 MED ORDER — PREDNISONE 10 MG (21) PO TBPK
20.0000 mg | ORAL_TABLET | Freq: Every evening | ORAL | Status: AC
  Administered 2021-12-10: 20 mg via ORAL

## 2021-12-09 MED ORDER — PREDNISONE 10 MG (21) PO TBPK
10.0000 mg | ORAL_TABLET | Freq: Three times a day (TID) | ORAL | Status: AC
  Administered 2021-12-10 (×3): 10 mg via ORAL
  Filled 2021-12-09: qty 21

## 2021-12-09 MED ORDER — PREDNISONE 10 MG (21) PO TBPK
20.0000 mg | ORAL_TABLET | Freq: Every evening | ORAL | Status: AC
  Administered 2021-12-09: 20 mg via ORAL

## 2021-12-09 MED ORDER — PREDNISONE 10 MG (21) PO TBPK
10.0000 mg | ORAL_TABLET | Freq: Four times a day (QID) | ORAL | Status: DC
  Administered 2021-12-11 – 2021-12-13 (×8): 10 mg via ORAL

## 2021-12-09 NOTE — Progress Notes (Signed)
     Subjective: 5 Days Post-Op Procedure(s) (LRB): TRANFORAMINAL LUMBAR INTERBODY FUSIONS LEFT LUMBAR THREE - FOUR AND LEFT LUMBAR FOUR - FIVE WITH REMOVAL OF HARDWARE LUMBAR FIVE -SACRAL ONE, POSTERIOR INSTRUMENTATION LUMBAR THREE, LUMBAR FOUR, LUMBAR FIVE AND SACRAL ONE, RODS SCREWS AND CAGES, LOCAL AND ALLOGRAFT BONE GRAFT (N/A) Complaints of severe left leg pain L4 and L5 distribution, radiographs without acute findings.  Patient reports pain as moderate.    Objective:   VITALS:  Temp:  [98.1 F (36.7 C)-98.7 F (37.1 C)] 98.7 F (37.1 C) (07/30 0741) Pulse Rate:  [79-94] 92 (07/30 0741) Resp:  [16-17] 17 (07/30 0741) BP: (124-143)/(67-86) 137/83 (07/30 0741) SpO2:  [95 %-100 %] 96 % (07/30 0741)  Neurologically intact ABD soft Neurovascular intact Sensation intact distally Intact pulses distally Dorsiflexion/Plantar flexion intact Incision: dressing C/D/I and scant drainage   LABS Recent Labs    12/07/21 0311 12/07/21 1054 12/09/21 0200  HGB 12.3* 12.4* 11.7*  WBC 11.5* 14.3* 12.4*  PLT 301 301 353   Recent Labs    12/07/21 1054 12/09/21 0200  NA 139 139  K 3.5 3.6  CL 108 105  CO2 25 26  BUN 9 10  CREATININE 0.61 0.65  GLUCOSE 122* 131*   No results for input(s): "LABPT", "INR" in the last 72 hours.   Assessment/Plan: 5 Days Post-Op Procedure(s) (LRB): TRANFORAMINAL LUMBAR INTERBODY FUSIONS LEFT LUMBAR THREE - FOUR AND LEFT LUMBAR FOUR - FIVE WITH REMOVAL OF HARDWARE LUMBAR FIVE -SACRAL ONE, POSTERIOR INSTRUMENTATION LUMBAR THREE, LUMBAR FOUR, LUMBAR FIVE AND SACRAL ONE, RODS SCREWS AND CAGES, LOCAL AND ALLOGRAFT BONE GRAFT (N/A)  Advance diet Up with therapy D/C IV fluids Check CT of lumbar spine for left lumbar nerve compression related to hardware or bone graph post TLIFS left L4-5 and L3-4.   [name] 12/09/2021, 12:56 PM Patient ID: Scott Tucker, male   DOB: April 28, 1966, 56 y.o.   MRN: [id number]

## 2021-12-09 NOTE — Plan of Care (Signed)
  Problem: Clinical Measurements: Goal: Respiratory complications will improve 12/09/2021 2009 by Algis Liming, RN Outcome: Progressing 12/09/2021 2008 by Algis Liming, RN Outcome: Progressing Goal: Cardiovascular complication will be avoided 12/09/2021 2009 by Algis Liming, RN Outcome: Progressing 12/09/2021 2008 by Algis Liming, RN Outcome: Progressing   Problem: Activity: Goal: Risk for activity intolerance will decrease Outcome: Progressing

## 2021-12-10 ENCOUNTER — Inpatient Hospital Stay (HOSPITAL_COMMUNITY): Payer: Medicare Other

## 2021-12-10 ENCOUNTER — Other Ambulatory Visit: Payer: Self-pay

## 2021-12-10 DIAGNOSIS — M5126 Other intervertebral disc displacement, lumbar region: Secondary | ICD-10-CM | POA: Diagnosis not present

## 2021-12-10 DIAGNOSIS — M4316 Spondylolisthesis, lumbar region: Secondary | ICD-10-CM | POA: Diagnosis not present

## 2021-12-10 LAB — GLUCOSE, CAPILLARY
Glucose-Capillary: 111 mg/dL — ABNORMAL HIGH (ref 70–99)
Glucose-Capillary: 171 mg/dL — ABNORMAL HIGH (ref 70–99)
Glucose-Capillary: 203 mg/dL — ABNORMAL HIGH (ref 70–99)

## 2021-12-10 MED ORDER — HYDROCODONE-ACETAMINOPHEN 7.5-325 MG PO TABS
1.0000 | ORAL_TABLET | Freq: Four times a day (QID) | ORAL | 0 refills | Status: DC | PRN
Start: 2021-12-10 — End: 2021-12-11

## 2021-12-10 MED ORDER — ORAL CARE MOUTH RINSE: 15.0000 mL | OROMUCOSAL | Status: DC | PRN

## 2021-12-10 MED ORDER — DOCUSATE SODIUM 100 MG PO CAPS
100.0000 mg | ORAL_CAPSULE | Freq: Two times a day (BID) | ORAL | 0 refills | Status: DC
  Filled 2021-12-10: qty 10, 5d supply, fill #0

## 2021-12-10 MED ORDER — METHOCARBAMOL 500 MG PO TABS: 500.0000 mg | ORAL_TABLET | Freq: Four times a day (QID) | ORAL | 1 refills | Status: DC | PRN

## 2021-12-10 MED ORDER — ASPIRIN 81 MG PO TBEC
81.0000 mg | DELAYED_RELEASE_TABLET | Freq: Every day | ORAL | 12 refills | Status: DC
  Filled 2021-12-10: qty 30, 30d supply, fill #0

## 2021-12-10 MED ORDER — HYDROCODONE-ACETAMINOPHEN 7.5-325 MG PO TABS: 1.0000 | ORAL_TABLET | Freq: Four times a day (QID) | ORAL | 0 refills | Status: DC | PRN

## 2021-12-10 MED FILL — Sodium Chloride IV Soln 0.9%: INTRAVENOUS | Qty: 1000 | Status: AC

## 2021-12-10 MED FILL — Sodium Chloride Irrigation Soln 0.9%: Qty: 3000 | Status: AC

## 2021-12-10 MED FILL — Heparin Sodium (Porcine) Inj 1000 Unit/ML: INTRAMUSCULAR | Qty: 30 | Status: AC

## 2021-12-10 NOTE — Progress Notes (Signed)
Physical Therapy Treatment Patient Details Name: Scott Tucker MRN: [id number] DOB: Oct 17, 1965 Today's Date: 12/10/2021   History of Present Illness Pt is a 56 y/o male s/p L3-4, L4-5, S1 fusion performed on 12/04/21. Pt with hx of spinal surgeries reporting first one completed in 1992.    PT Comments    Continuing work on functional mobility and activity tolerance;  Session focused on walking in the hallway today, pt set a distance gaol and met it; Extremely slow pace, and heavy dependence on RW for support and to unweigh painful back and LLE; Discussed options for dorsiflexion assist LLE -- will take a closer look at L ankle next session; Solid progress; Continue to recommend acute inpatient rehab (AIR) for post-acute therapy needs.   Recommendations for follow up therapy are one component of a multi-disciplinary discharge planning process, led by the attending physician.  Recommendations may be updated based on patient status, additional functional criteria and insurance authorization.  Follow Up Recommendations  Acute inpatient rehab (3hours/day)     Assistance Recommended at Discharge Intermittent Supervision/Assistance  Patient can return home with the following A little help with walking and/or transfers;Assistance with cooking/housework;Assist for transportation;A little help with bathing/dressing/bathroom;Help with stairs or ramp for entrance   Equipment Recommendations  Rolling walker (2 wheels);BSC/3in1 (consider 2 RW, one for upstairs and one for downstairs)    Recommendations for Other Services Rehab consult     Precautions / Restrictions Precautions Precautions: Back Precaution Comments: reviewed 3/3 back precautions Required Braces or Orthoses: Spinal Brace Spinal Brace: Applied in sitting position;Lumbar corset     Mobility  Bed Mobility                    Transfers Overall transfer level: Needs assistance Equipment used: Rolling walker (2  wheels) Transfers: Sit to/from Stand Sit to Stand: Min guard           General transfer comment: increased time to stand with RW from recliner with good hand placement. minor cues for keeping spine straight    Ambulation/Gait Ambulation/Gait assistance: Min guard (with and without physical contact) Gait Distance (Feet): 110 Feet Assistive device: Rolling walker (2 wheels) Gait Pattern/deviations: Decreased stride length, Step-to pattern, Decreased weight shift to left Gait velocity: decr     General Gait Details: slow, guarded gait. Heavy reliance on RW. no LOB, pt setting goal and reaching it this session, reamins motivated; a few standing rest breaks; LLE shaky in stance, but no overt buckling; noting decr dorsiflexion LLE and as we discussed the quality of his L steps, he indicated that his falls were related to L toe drag   Stairs             Wheelchair Mobility    Modified Rankin (Stroke Patients Only)       Balance     Sitting balance-Leahy Scale: Fair       Standing balance-Leahy Scale: Poor (approaching Fair)                              Cognition Arousal/Alertness: Awake/alert Behavior During Therapy: WFL for tasks assessed/performed, Flat affect Overall Cognitive Status: Within Functional Limits for tasks assessed                                 General Comments: motivated to progress this session, less fixation on pain when chatting about  hobbies pt laughing and conversative        Exercises      General Comments General comments (skin integrity, edema, etc.): Pt is hopeful to go to rehab, and also problem-solving on how to manage at home      Pertinent Vitals/Pain Pain Assessment Pain Assessment: Faces Faces Pain Scale: Hurts whole lot Pain Location: back; radiating to LLE Pain Descriptors / Indicators: Grimacing, Guarding, Shooting Pain Intervention(s): Monitored during session, Repositioned    Home Living                           Prior Function            PT Goals (current goals can now be found in the care plan section) Acute Rehab PT Goals Patient Stated Goal: be able to walk without pain PT Goal Formulation: With patient Time For Goal Achievement: 12/19/21 Potential to Achieve Goals: Good Progress towards PT goals: Progressing toward goals    Frequency    Min 5X/week      PT Plan Current plan remains appropriate    Co-evaluation              AM-PAC PT "6 Clicks" Mobility   Outcome Measure  Help needed turning from your back to your side while in a flat bed without using bedrails?: A Little Help needed moving from lying on your back to sitting on the side of a flat bed without using bedrails?: A Little Help needed moving to and from a bed to a chair (including a wheelchair)?: A Little Help needed standing up from a chair using your arms (e.g., wheelchair or bedside chair)?: A Little Help needed to walk in hospital room?: A Little Help needed climbing 3-5 steps with a railing? : A Lot 6 Click Score: 17    End of Session Equipment Utilized During Treatment: Back brace Activity Tolerance: Patient limited by pain Patient left: with call bell/phone within reach;in chair;with chair alarm set Nurse Communication: Mobility status PT Visit Diagnosis: Unsteadiness on feet (R26.81);Other abnormalities of gait and mobility (R26.89);Pain Pain - Right/Left:  (Low back)     Time: 2836-6294 PT Time Calculation (min) (ACUTE ONLY): 45 min  Charges:  $Gait Training: 38-52 mins                     Roney Marion, [place] Office [phone]    Colletta Maryland 12/10/2021, 3:09 PM

## 2021-12-10 NOTE — Plan of Care (Signed)

## 2021-12-10 NOTE — Progress Notes (Signed)
Occupational Therapy Treatment Patient Details Name: Scott Tucker MRN: [id number] DOB: July 08, 1965 Today's Date: 12/10/2021   History of present illness Pt is a 56 y/o male s/p L3-4, L4-5, S1 fusion performed on 12/04/21. Pt with hx of spinal surgeries reporting first one completed in 1992.   OT comments  Pt remains eager to participate despite pain. Pt able to manage LSO brace with Setup assist, mobility to/from bathroom using RW at min guard though increased time needed to complete tasks. Emphasis on task modification to ease pain and compensatory strategies for LB ADLs with overall Mod A needed for LB dressing. Pt may benefit from AE education in next sessions. Continue to feel pt will be an excellent AIR candidate.   Recommendations for follow up therapy are one component of a multi-disciplinary discharge planning process, led by the attending physician.  Recommendations may be updated based on patient status, additional functional criteria and insurance authorization.    Follow Up Recommendations  Acute inpatient rehab (3hours/day)    Assistance Recommended at Discharge Intermittent Supervision/Assistance  Patient can return home with the following  A little help with walking and/or transfers;A lot of help with bathing/dressing/bathroom;Assistance with cooking/housework;Assist for transportation;Help with stairs or ramp for entrance   Equipment Recommendations  BSC/3in1;Other (comment) (RW)    Recommendations for Other Services Rehab consult    Precautions / Restrictions Precautions Precautions: Back Required Braces or Orthoses: Spinal Brace Spinal Brace: Applied in sitting position;Lumbar corset Restrictions Weight Bearing Restrictions: No       Mobility Bed Mobility Overal bed mobility: Needs Assistance Bed Mobility: Rolling, Sidelying to Sit Rolling: Modified independent (Device/Increase time) Sidelying to sit: Supervision       General bed mobility comments: cues  for log rolling and good carryover    Transfers Overall transfer level: Needs assistance Equipment used: Rolling walker (2 wheels) Transfers: Sit to/from Stand Sit to Stand: Min guard           General transfer comment: increased time to stand with RW from bedside, BSC (seated at sink) with good hand placement. minor cues for keeping spine straight     Balance Overall balance assessment: Needs assistance Sitting-balance support: Feet supported, No upper extremity supported Sitting balance-Leahy Scale: Fair     Standing balance support: Bilateral upper extremity supported, During functional activity Standing balance-Leahy Scale: Poor Standing balance comment: reliant on at least one UE support in standing                           ADL either performed or assessed with clinical judgement   ADL Overall ADL's : Needs assistance/impaired     Grooming: Modified independent;Sitting;Oral care;Wash/dry face;Wash/dry hands Grooming Details (indicate cue type and reason): preferred to perform task sitting at sink rather than standing d/t pain. discussed home modiifications/setup to allow energy conservation at home as well             Lower Body Dressing: Moderate assistance;Sit to/from stand;Cueing for back precautions Lower Body Dressing Details (indicate cue type and reason): reviewed figure four position for LB dressing with pt typically pulling on compression stockings to lift LE. able to use socks to pull LE up 75% of the way though painful. may benefit from AE education             Functional mobility during ADLs: Min guard;Rolling walker (2 wheels) General ADL Comments: pt able to don LSO brace with setup sitting EOB. Emphasis on back precautions,  activity pacing (d/t pain), and gradual progression of strategies for balance    Extremity/Trunk Assessment Upper Extremity Assessment Upper Extremity Assessment: Overall WFL for tasks assessed   Lower Extremity  Assessment Lower Extremity Assessment: Defer to PT evaluation        Vision   Vision Assessment?: No apparent visual deficits   Perception     Praxis      Cognition Arousal/Alertness: Awake/alert Behavior During Therapy: WFL for tasks assessed/performed, Flat affect Overall Cognitive Status: Within Functional Limits for tasks assessed                                          Exercises      Shoulder Instructions       General Comments      Pertinent Vitals/ Pain       Pain Assessment Pain Assessment: Faces Faces Pain Scale: Hurts even more Pain Location: back; radiating to LLE Pain Descriptors / Indicators: Grimacing, Guarding, Shooting Pain Intervention(s): Monitored during session, Premedicated before session, Limited activity within patient's tolerance  Home Living                                          Prior Functioning/Environment              Frequency  Min 3X/week        Progress Toward Goals  OT Goals(current goals can now be found in the care plan section)  Progress towards OT goals: Progressing toward goals  Acute Rehab OT Goals Patient Stated Goal: go to rehab, be able to live somewhere with less stairs OT Goal Formulation: With patient Time For Goal Achievement: 12/19/21 Potential to Achieve Goals: Fair ADL Goals Pt Will Perform Grooming: with modified independence;sitting Pt Will Perform Upper Body Bathing: with set-up;sitting Pt Will Perform Lower Body Bathing: with max assist;sit to/from stand;with adaptive equipment Pt Will Perform Upper Body Dressing: with set-up;sitting Pt Will Perform Lower Body Dressing: with max assist;sit to/from stand;with adaptive equipment Pt Will Transfer to Toilet: with supervision;ambulating;bedside commode Pt Will Perform Toileting - Clothing Manipulation and hygiene: with supervision;sit to/from stand Pt/caregiver will Perform Home Exercise Program: Increased  strength;Both right and left upper extremity;With written HEP provided;Independently Additional ADL Goal #1: Pt will increase bed mobility to Mod I in order to increase his ability to participate in bathing and dressing task while seated on EOB while maintaining back precautions with no reminders.  Plan Discharge plan remains appropriate;Frequency remains appropriate    Co-evaluation                 AM-PAC OT "6 Clicks" Daily Activity     Outcome Measure   Help from another person eating meals?: None Help from another person taking care of personal grooming?: A Little Help from another person toileting, which includes using toliet, bedpan, or urinal?: A Lot Help from another person bathing (including washing, rinsing, drying)?: A Lot Help from another person to put on and taking off regular upper body clothing?: A Little Help from another person to put on and taking off regular lower body clothing?: A Lot 6 Click Score: 16    End of Session Equipment Utilized During Treatment: Rolling walker (2 wheels);Back brace  OT Visit Diagnosis: Unsteadiness on feet (R26.81);Repeated falls (R29.6);Muscle weakness (generalized) (M62.81);Other  symptoms and signs involving the nervous system (R29.898);Pain   Activity Tolerance Patient tolerated treatment well   Patient Left in chair;with call bell/phone within reach   Nurse Communication          Time: 0728-0802 OT Time Calculation (min): 34 min  Charges: OT General Charges $OT Visit: 1 Visit OT Treatments $Self Care/Home Management : 23-37 mins  Bradd Canary, OTR/L Acute Rehab Services Office: [phone]   Lorre Munroe 12/10/2021, 8:16 AM

## 2021-12-10 NOTE — Progress Notes (Addendum)
     Subjective: 6 Days Post-Op Procedure(s) (LRB): TRANFORAMINAL LUMBAR INTERBODY FUSIONS LEFT LUMBAR THREE - FOUR AND LEFT LUMBAR FOUR - FIVE WITH REMOVAL OF HARDWARE LUMBAR FIVE -SACRAL ONE, POSTERIOR INSTRUMENTATION LUMBAR THREE, LUMBAR FOUR, LUMBAR FIVE AND SACRAL ONE, RODS SCREWS AND CAGES, LOCAL AND ALLOGRAFT BONE GRAFT (N/A) Awake, alert and oriented x 4, dressing is intact. Legs with good motor complaints of left leg pain remain. CT report is without sign of hardware or bonegraft complications. Canal is well decompressed. Voiding. Needs BM.  Patient reports pain as moderate.    Objective:   VITALS:  Temp:  [98.4 F (36.9 C)-98.7 F (37.1 C)] 98.6 F (37 C) (07/31 8413) Pulse Rate:  [78-86] 86 (07/31 0608) Resp:  [16-19] 16 (07/31 0608) BP: (114-141)/(71-88) 141/88 (07/31 0608) SpO2:  [97 %-100 %] 97 % (07/31 2440)  Neurologically intact ABD soft Neurovascular intact Sensation intact distally Intact pulses distally Dorsiflexion/Plantar flexion intact Incision: dressing C/D/I and no drainage   LABS Recent Labs    12/07/21 1054 12/09/21 0200  HGB 12.4* 11.7*  WBC 14.3* 12.4*  PLT 301 353   Recent Labs    12/07/21 1054 12/09/21 0200  NA 139 139  K 3.5 3.6  CL 108 105  CO2 25 26  BUN 9 10  CREATININE 0.61 0.65  GLUCOSE 122* 131*   No results for input(s): "LABPT", "INR" in the last 72 hours.   Assessment/Plan: 6 Days Post-Op Procedure(s) (LRB): TRANFORAMINAL LUMBAR INTERBODY FUSIONS LEFT LUMBAR THREE - FOUR AND LEFT LUMBAR FOUR - FIVE WITH REMOVAL OF HARDWARE LUMBAR FIVE -SACRAL ONE, POSTERIOR INSTRUMENTATION LUMBAR THREE, LUMBAR FOUR, LUMBAR FIVE AND SACRAL ONE, RODS SCREWS AND CAGES, LOCAL AND ALLOGRAFT BONE GRAFT (N/A) Orthopaedically stable, no further surgery necessary.  Advance diet Up with therapy D/C IV fluids Discharge to [facility] CIR is not able to consider for inpatient rehab then he will need short Term SNF.  Medrol dose pak for  neurogenic discomfort left leg.   [name] 12/10/2021, 7:55 AM Patient ID: Scott Tucker, male   DOB: 1965/07/04, 56 y.o.   MRN: [id number]

## 2021-12-10 NOTE — Progress Notes (Signed)
Inpatient Rehab Admissions Coordinator:  Insurance authorization started. Notified pt's girlfriend Film/video editor.  Will continue to follow.   Wolfgang Phoenix, MS, CCC-SLP Admissions Coordinator [phone]

## 2021-12-11 LAB — GLUCOSE, CAPILLARY
Glucose-Capillary: 115 mg/dL — ABNORMAL HIGH (ref 70–99)
Glucose-Capillary: 123 mg/dL — ABNORMAL HIGH (ref 70–99)
Glucose-Capillary: 98 mg/dL (ref 70–99)
Glucose-Capillary: 147 mg/dL — ABNORMAL HIGH (ref 70–99)

## 2021-12-11 MED ORDER — PREGABALIN 25 MG PO CAPS
50.0000 mg | ORAL_CAPSULE | Freq: Two times a day (BID) | ORAL | Status: DC
  Administered 2021-12-11 – 2021-12-12 (×3): 50 mg via ORAL
  Filled 2021-12-11 (×3): qty 2

## 2021-12-11 MED ORDER — METHOCARBAMOL 500 MG PO TABS: 500.0000 mg | ORAL_TABLET | Freq: Four times a day (QID) | ORAL | 1 refills | Status: DC | PRN

## 2021-12-11 MED ORDER — PREGABALIN 50 MG PO CAPS: 50.0000 mg | ORAL_CAPSULE | Freq: Two times a day (BID) | ORAL | 0 refills | Status: DC

## 2021-12-11 MED ORDER — HYDROCODONE-ACETAMINOPHEN 7.5-325 MG PO TABS: 1.0000 | ORAL_TABLET | Freq: Four times a day (QID) | ORAL | 0 refills | Status: DC | PRN

## 2021-12-11 NOTE — Plan of Care (Signed)
No acute events overnight.    Problem: Education: Goal: Knowledge of General Education information will improve Description: Including pain rating scale, medication(s)/side effects and non-pharmacologic comfort measures Outcome: Progressing   Problem: Health Behavior/Discharge Planning: Goal: Ability to manage health-related needs will improve Outcome: Progressing   Problem: Clinical Measurements: Goal: Ability to maintain clinical measurements within normal limits will improve Outcome: Progressing Goal: Will remain free from infection Outcome: Progressing Goal: Diagnostic test results will improve Outcome: Progressing Goal: Respiratory complications will improve Outcome: Progressing Goal: Cardiovascular complication will be avoided Outcome: Progressing   Problem: Activity: Goal: Risk for activity intolerance will decrease Outcome: Progressing   Problem: Nutrition: Goal: Adequate nutrition will be maintained Outcome: Progressing   Problem: Coping: Goal: Level of anxiety will decrease Outcome: Progressing   Problem: Elimination: Goal: Will not experience complications related to bowel motility Outcome: Progressing Goal: Will not experience complications related to urinary retention Outcome: Progressing   Problem: Pain Managment: Goal: General experience of comfort will improve Outcome: Progressing   Problem: Safety: Goal: Ability to remain free from injury will improve Outcome: Progressing   Problem: Skin Integrity: Goal: Risk for impaired skin integrity will decrease Outcome: Progressing   Problem: Education: Goal: Ability to describe self-care measures that may prevent or decrease complications (Diabetes Survival Skills Education) will improve Outcome: Progressing Goal: Individualized Educational Video(s) Outcome: Progressing   Problem: Coping: Goal: Ability to adjust to condition or change in health will improve Outcome: Progressing   Problem: Fluid  Volume: Goal: Ability to maintain a balanced intake and output will improve Outcome: Progressing   Problem: Health Behavior/Discharge Planning: Goal: Ability to identify and utilize available resources and services will improve Outcome: Progressing Goal: Ability to manage health-related needs will improve Outcome: Progressing   Problem: Metabolic: Goal: Ability to maintain appropriate glucose levels will improve Outcome: Progressing   Problem: Nutritional: Goal: Maintenance of adequate nutrition will improve Outcome: Progressing Goal: Progress toward achieving an optimal weight will improve Outcome: Progressing   Problem: Skin Integrity: Goal: Risk for impaired skin integrity will decrease Outcome: Progressing   Problem: Tissue Perfusion: Goal: Adequacy of tissue perfusion will improve Outcome: Progressing   

## 2021-12-11 NOTE — Progress Notes (Signed)
Inpatient Rehabilitation Admissions Coordinator   I await insurance approval for possible CIR admit. I met at bedside and reviewed current masking and visitor guidelines on CIR unit due to COVID concerns. He wishes to continue to pursue admit.   Danne Baxter, RN, MSN Rehab Admissions Coordinator [phone] 12/11/2021 12:16 PM

## 2021-12-11 NOTE — Progress Notes (Signed)
     Subjective: 7 Days Post-Op Procedure(s) (LRB): TRANFORAMINAL LUMBAR INTERBODY FUSIONS LEFT LUMBAR THREE - FOUR AND LEFT LUMBAR FOUR - FIVE WITH REMOVAL OF HARDWARE LUMBAR FIVE -SACRAL ONE, POSTERIOR INSTRUMENTATION LUMBAR THREE, LUMBAR FOUR, LUMBAR FIVE AND SACRAL ONE, RODS SCREWS AND CAGES, LOCAL AND ALLOGRAFT BONE GRAFT (N/A) Awake, alert and oriented x 4. 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  Patient reports pain as moderate.    Objective:   VITALS:  Temp:  [98.2 F (36.8 C)-98.3 F (36.8 C)] 98.3 F (36.8 C) (08/01 0729) Pulse Rate:  [85-91] 85 (08/01 0729) Resp:  [17] 17 (08/01 0729) BP: (118-141)/(72-85) 118/72 (08/01 0729) SpO2:  [99 %-100 %] 99 % (08/01 0729)  Neurologically intact ABD soft Neurovascular intact Sensation intact distally Intact pulses distally Dorsiflexion/Plantar flexion intact Incision: dressing C/D/I and scant drainage   LABS Recent Labs    12/09/21 0200  HGB 11.7*  WBC 12.4*  PLT 353   Recent Labs    12/09/21 0200  NA 139  K 3.6  CL 105  CO2 26  BUN 10  CREATININE 0.65  GLUCOSE 131*   No results for input(s): "LABPT", "INR" in the last 72 hours.   Assessment/Plan: 7 Days Post-Op Procedure(s) (LRB): TRANFORAMINAL LUMBAR INTERBODY FUSIONS LEFT LUMBAR THREE - FOUR AND LEFT LUMBAR FOUR - FIVE WITH REMOVAL OF HARDWARE LUMBAR FIVE -SACRAL ONE, POSTERIOR INSTRUMENTATION LUMBAR THREE, LUMBAR FOUR, LUMBAR FIVE AND SACRAL ONE, RODS SCREWS AND CAGES, LOCAL AND ALLOGRAFT BONE GRAFT (N/A)  Advance diet Up with therapy Discharge to SNF  Scott Tucker 12/11/2021, 8:07 AM Patient ID: Scott Tucker, male   DOB: 11/27/1965, 56 y.o.   MRN: [id number]

## 2021-12-11 NOTE — Plan of Care (Signed)

## 2021-12-11 NOTE — Care Management Important Message (Signed)
Important Message  Patient Details  Name: Scott Tucker MRN: [id number] Date of Birth: April 19, 1966   Medicare Important Message Given:  Yes     Sherilyn Banker 12/11/2021, 11:54 AM

## 2021-12-11 NOTE — Progress Notes (Signed)
Physical Therapy Treatment Patient Details Name: Scott Tucker MRN: [id number] DOB: [birth year] Today's Date: 12/11/2021   History of Present Illness Pt is a 56 y/o male s/p L3-4, L4-5, S1 fusion performed on 12/04/21. Pt with hx of spinal surgeries reporting first one completed in 1992.    PT Comments    Continuing work on functional mobility and activity tolerance;  session focused on trying walking with an ace wrap for dorsiflexion assist, and seeing if it is helpful with walking; Overall, the LLE dorsiflexion assist proved to be useful; Worth considering an AFO for dorsiflexion assist -- an AFO can also potentially help with L knee extension stability as well; recommend an Orthotist Consult (this can be done on Rehab floor as well)  Recommendations for follow up therapy are one component of a multi-disciplinary discharge planning process, led by the attending physician.  Recommendations may be updated based on patient status, additional functional criteria and insurance authorization.  Follow Up Recommendations  Acute inpatient rehab (3hours/day)     Assistance Recommended at Discharge Intermittent Supervision/Assistance  Patient can return home with the following A little help with walking and/or transfers;Assistance with cooking/housework;Assist for transportation;A little help with bathing/dressing/bathroom;Help with stairs or ramp for entrance   Equipment Recommendations  Rolling walker (2 wheels);BSC/3in1 (consider 2 RW, one for upstairs and one for downstairs)    Recommendations for Other Services Rehab consult     Precautions / Restrictions Precautions Precautions: Back Precaution Comments: reviewed 3/3 back precautions Required Braces or Orthoses: Spinal Brace Spinal Brace: Applied in sitting position;Lumbar corset     Mobility  Bed Mobility Overal bed mobility: Needs Assistance Bed Mobility: Sit to Sidelying, Rolling         Sit to sidelying: Min assist General  bed mobility comments: Assist for helping LEs into bed; good log roll form    Transfers Overall transfer level: Needs assistance Equipment used: Rolling walker (2 wheels) Transfers: Sit to/from Stand Sit to Stand: Min guard           General transfer comment: increased time to stand with RW from recliner with good hand placement. minor cues for keeping spine straight    Ambulation/Gait Ambulation/Gait assistance: Min guard (with and without physical contact) Gait Distance (Feet): 35 Feet Assistive device: Rolling walker (2 wheels) Gait Pattern/deviations: Decreased stride length, Step-to pattern, Decreased weight shift to left Gait velocity: very slow     General Gait Details: Noting slightly better LLE advancement with ace wrap for doriflexion assist; pt seemed to like teh dorsiflexion assist   Stairs             Wheelchair Mobility    Modified Rankin (Stroke Patients Only)       Balance     Sitting balance-Leahy Scale: Fair       Standing balance-Leahy Scale: Poor (approaching Fair)                              Cognition Arousal/Alertness: Awake/alert Behavior During Therapy: WFL for tasks assessed/performed (Emotional) Overall Cognitive Status: Within Functional Limits for tasks assessed                                          Exercises      General Comments General comments (skin integrity, edema, etc.): In a lot of pain today, and at times  emotional; Still, he participated well, and seemed in better spirits after speaking with [facility] Admissions Coordinator      Pertinent Vitals/Pain Pain Assessment Pain Assessment: 0-10 Pain Score: 10-Worst pain ever Pain Location: back; radiating to LLE Pain Descriptors / Indicators: Grimacing, Guarding, Shooting Pain Intervention(s): Monitored during session    Home Living                          Prior Function            PT Goals (current goals can now be  found in the care plan section) Acute Rehab PT Goals Patient Stated Goal: be able to walk without pain PT Goal Formulation: With patient Time For Goal Achievement: 12/19/21 Potential to Achieve Goals: Good Progress towards PT goals: Progressing toward goals    Frequency    Min 5X/week      PT Plan Current plan remains appropriate    Co-evaluation              AM-PAC PT "6 Clicks" Mobility   Outcome Measure  Help needed turning from your back to your side while in a flat bed without using bedrails?: A Little Help needed moving from lying on your back to sitting on the side of a flat bed without using bedrails?: A Little Help needed moving to and from a bed to a chair (including a wheelchair)?: A Little Help needed standing up from a chair using your arms (e.g., wheelchair or bedside chair)?: A Little Help needed to walk in hospital room?: A Little Help needed climbing 3-5 steps with a railing? : A Lot 6 Click Score: 17    End of Session Equipment Utilized During Treatment: Gait belt;Other (comment) (ace wrap LLE for dorsiflexion assist) Activity Tolerance: Patient limited by pain Patient left: in bed;with call bell/phone within reach;with nursing/sitter in room Nurse Communication: Mobility status PT Visit Diagnosis: Unsteadiness on feet (R26.81);Other abnormalities of gait and mobility (R26.89);Pain Pain - Right/Left:  (Low back; L shooting pain) Pain - part of body: Leg (and back)     Time: 5329-9242 PT Time Calculation (min) (ACUTE ONLY): 39 min  Charges:  $Gait Training: 23-37 mins $Therapeutic Activity: 8-22 mins                     Scott Tucker, PT  Acute Rehabilitation Services Office [phone]    Scott Tucker 12/11/2021, 3:37 PM

## 2021-12-12 ENCOUNTER — Encounter (INDEPENDENT_AMBULATORY_CARE_PROVIDER_SITE_OTHER): Payer: Self-pay | Admitting: Primary Care

## 2021-12-12 LAB — GLUCOSE, CAPILLARY
Glucose-Capillary: 102 mg/dL — ABNORMAL HIGH (ref 70–99)
Glucose-Capillary: 164 mg/dL — ABNORMAL HIGH (ref 70–99)
Glucose-Capillary: 169 mg/dL — ABNORMAL HIGH (ref 70–99)
Glucose-Capillary: 132 mg/dL — ABNORMAL HIGH (ref 70–99)

## 2021-12-12 MED ORDER — PREGABALIN 75 MG PO CAPS
75.0000 mg | ORAL_CAPSULE | Freq: Two times a day (BID) | ORAL | Status: DC
  Administered 2021-12-12 – 2021-12-13 (×2): 75 mg via ORAL
  Filled 2021-12-12 (×2): qty 1

## 2021-12-12 NOTE — Progress Notes (Signed)
Inpatient Rehabilitation Admissions Coordinator   I have received insurance approval for [facility] admit and can plan admit Thursday. I made patient aware and he is in agreement. I will follow in the morning to arrange admit.  Ottie Glazier, RN, MSN Rehab Admissions Coordinator [phone] 12/12/2021 4:39 PM

## 2021-12-12 NOTE — Progress Notes (Signed)
     Subjective: 8 Days Post-Op Procedure(s) (LRB): TRANFORAMINAL LUMBAR INTERBODY FUSIONS LEFT LUMBAR THREE - FOUR AND LEFT LUMBAR FOUR - FIVE WITH REMOVAL OF HARDWARE LUMBAR FIVE -SACRAL ONE, POSTERIOR INSTRUMENTATION LUMBAR THREE, LUMBAR FOUR, LUMBAR FIVE AND SACRAL ONE, RODS SCREWS AND CAGES, LOCAL AND ALLOGRAFT BONE GRAFT (N/A) Awake, alert and oriented x 4. Dressing dry. Voiding well, BM today, regular. Tolerating po Nourishment and narcotics.  Patient reports pain as moderate.    Objective:   VITALS:  Temp:  [98.1 F (36.7 C)-98.3 F (36.8 C)] 98.1 F (36.7 C) (08/02 0724) Pulse Rate:  [80-96] 81 (08/02 0724) Resp:  [15-17] 15 (08/02 0554) BP: (117-120)/(67-81) 117/81 (08/02 0724) SpO2:  [95 %-99 %] 95 % (08/02 0724)  Neurologically intact ABD soft Neurovascular intact Sensation intact distally Intact pulses distally Dorsiflexion/Plantar flexion intact Incision: dressing C/D/I and no drainage Pain with dorsiflexion of the left foot complains of pain in the left anterior thigh. Motor is intact.  LABS No results for input(s): "HGB", "WBC", "PLT" in the last 72 hours. No results for input(s): "NA", "K", "CL", "CO2", "BUN", "CREATININE", "GLUCOSE" in the last 72 hours. No results for input(s): "LABPT", "INR" in the last 72 hours.   Assessment/Plan: 8 Days Post-Op Procedure(s) (LRB): TRANFORAMINAL LUMBAR INTERBODY FUSIONS LEFT LUMBAR THREE - FOUR AND LEFT LUMBAR FOUR - FIVE WITH REMOVAL OF HARDWARE LUMBAR FIVE -SACRAL ONE, POSTERIOR INSTRUMENTATION LUMBAR THREE, LUMBAR FOUR, LUMBAR FIVE AND SACRAL ONE, RODS SCREWS AND CAGES, LOCAL AND ALLOGRAFT BONE GRAFT (N/A) Likely neuropathic pain left L3 or L4.  Advance diet Up with therapy Discharge to SNF Increase pregablin to 75 mg BID.    [name] 12/12/2021, 4:44 PM Patient ID: Scott Tucker, male   DOB: March 25, 1966, 56 y.o.   MRN: [id number]

## 2021-12-12 NOTE — Plan of Care (Signed)

## 2021-12-13 ENCOUNTER — Inpatient Hospital Stay (HOSPITAL_COMMUNITY): Payer: Medicare Other

## 2021-12-13 ENCOUNTER — Encounter (HOSPITAL_COMMUNITY): Payer: Self-pay | Admitting: Physical Medicine & Rehabilitation

## 2021-12-13 ENCOUNTER — Encounter (HOSPITAL_COMMUNITY): Payer: Medicare Other

## 2021-12-13 ENCOUNTER — Inpatient Hospital Stay (HOSPITAL_COMMUNITY)
Admission: RE | Admit: 2021-12-13 | Discharge: 2021-12-25 | DRG: 560 | Disposition: A | Discharge status: Home Health | Payer: Medicare Other | Source: Intra-hospital | Attending: Physical Medicine & Rehabilitation | Admitting: Physical Medicine & Rehabilitation

## 2021-12-13 ENCOUNTER — Other Ambulatory Visit: Payer: Self-pay

## 2021-12-13 DIAGNOSIS — E119 Type 2 diabetes mellitus without complications: Secondary | ICD-10-CM | POA: Diagnosis present

## 2021-12-13 DIAGNOSIS — M48061 Spinal stenosis, lumbar region without neurogenic claudication: Secondary | ICD-10-CM | POA: Diagnosis not present

## 2021-12-13 DIAGNOSIS — E782 Mixed hyperlipidemia: Secondary | ICD-10-CM

## 2021-12-13 DIAGNOSIS — D62 Acute posthemorrhagic anemia: Secondary | ICD-10-CM | POA: Diagnosis not present

## 2021-12-13 DIAGNOSIS — Z981 Arthrodesis status: Secondary | ICD-10-CM

## 2021-12-13 DIAGNOSIS — M21372 Foot drop, left foot: Secondary | ICD-10-CM | POA: Diagnosis present

## 2021-12-13 DIAGNOSIS — N200 Calculus of kidney: Secondary | ICD-10-CM | POA: Diagnosis present

## 2021-12-13 DIAGNOSIS — M4319 Spondylolisthesis, multiple sites in spine: Secondary | ICD-10-CM | POA: Diagnosis not present

## 2021-12-13 DIAGNOSIS — L309 Dermatitis, unspecified: Secondary | ICD-10-CM | POA: Diagnosis present

## 2021-12-13 DIAGNOSIS — M792 Neuralgia and neuritis, unspecified: Secondary | ICD-10-CM

## 2021-12-13 DIAGNOSIS — Z79899 Other long term (current) drug therapy: Secondary | ICD-10-CM

## 2021-12-13 DIAGNOSIS — M545 Low back pain, unspecified: Secondary | ICD-10-CM | POA: Diagnosis present

## 2021-12-13 DIAGNOSIS — M48062 Spinal stenosis, lumbar region with neurogenic claudication: Secondary | ICD-10-CM | POA: Diagnosis not present

## 2021-12-13 DIAGNOSIS — Z91040 Latex allergy status: Secondary | ICD-10-CM

## 2021-12-13 DIAGNOSIS — Z4789 Encounter for other orthopedic aftercare: Secondary | ICD-10-CM | POA: Diagnosis not present

## 2021-12-13 DIAGNOSIS — M1990 Unspecified osteoarthritis, unspecified site: Secondary | ICD-10-CM | POA: Diagnosis present

## 2021-12-13 DIAGNOSIS — E559 Vitamin D deficiency, unspecified: Secondary | ICD-10-CM | POA: Diagnosis present

## 2021-12-13 DIAGNOSIS — L259 Unspecified contact dermatitis, unspecified cause: Secondary | ICD-10-CM | POA: Diagnosis not present

## 2021-12-13 DIAGNOSIS — Z794 Long term (current) use of insulin: Secondary | ICD-10-CM

## 2021-12-13 DIAGNOSIS — F419 Anxiety disorder, unspecified: Secondary | ICD-10-CM | POA: Diagnosis present

## 2021-12-13 DIAGNOSIS — E876 Hypokalemia: Secondary | ICD-10-CM | POA: Diagnosis not present

## 2021-12-13 DIAGNOSIS — M4716 Other spondylosis with myelopathy, lumbar region: Secondary | ICD-10-CM

## 2021-12-13 DIAGNOSIS — Z760 Encounter for issue of repeat prescription: Secondary | ICD-10-CM

## 2021-12-13 DIAGNOSIS — M4326 Fusion of spine, lumbar region: Secondary | ICD-10-CM | POA: Diagnosis not present

## 2021-12-13 DIAGNOSIS — Z8616 Personal history of COVID-19: Secondary | ICD-10-CM

## 2021-12-13 DIAGNOSIS — D72829 Elevated white blood cell count, unspecified: Secondary | ICD-10-CM | POA: Diagnosis present

## 2021-12-13 DIAGNOSIS — G894 Chronic pain syndrome: Secondary | ICD-10-CM | POA: Diagnosis present

## 2021-12-13 DIAGNOSIS — M4316 Spondylolisthesis, lumbar region: Secondary | ICD-10-CM | POA: Diagnosis present

## 2021-12-13 DIAGNOSIS — G992 Myelopathy in diseases classified elsewhere: Secondary | ICD-10-CM | POA: Diagnosis present

## 2021-12-13 DIAGNOSIS — M62838 Other muscle spasm: Secondary | ICD-10-CM | POA: Diagnosis present

## 2021-12-13 DIAGNOSIS — F329 Major depressive disorder, single episode, unspecified: Secondary | ICD-10-CM

## 2021-12-13 DIAGNOSIS — E785 Hyperlipidemia, unspecified: Secondary | ICD-10-CM | POA: Diagnosis present

## 2021-12-13 DIAGNOSIS — I2510 Atherosclerotic heart disease of native coronary artery without angina pectoris: Secondary | ICD-10-CM | POA: Diagnosis present

## 2021-12-13 DIAGNOSIS — L03213 Periorbital cellulitis: Secondary | ICD-10-CM

## 2021-12-13 DIAGNOSIS — I10 Essential (primary) hypertension: Secondary | ICD-10-CM | POA: Diagnosis not present

## 2021-12-13 DIAGNOSIS — R531 Weakness: Secondary | ICD-10-CM | POA: Diagnosis present

## 2021-12-13 DIAGNOSIS — J986 Disorders of diaphragm: Secondary | ICD-10-CM | POA: Diagnosis not present

## 2021-12-13 DIAGNOSIS — Z8249 Family history of ischemic heart disease and other diseases of the circulatory system: Secondary | ICD-10-CM

## 2021-12-13 DIAGNOSIS — R5381 Other malaise: Secondary | ICD-10-CM | POA: Diagnosis present

## 2021-12-13 LAB — CBC WITH DIFFERENTIAL/PLATELET
Abs Immature Granulocytes: 0 10*3/uL (ref 0.00–0.07)
Basophils Absolute: 0 10*3/uL (ref 0.0–0.1)
Basophils Relative: 0 %
Blasts: 1 %
Eosinophils Absolute: 0 10*3/uL (ref 0.0–0.5)
Eosinophils Relative: 0 %
HCT: 35 % — ABNORMAL LOW (ref 39.0–52.0)
Hemoglobin: 11.9 g/dL — ABNORMAL LOW (ref 13.0–17.0)
Lymphocytes Relative: 18 %
Lymphs Abs: 2.5 10*3/uL (ref 0.7–4.0)
MCH: 31.6 pg (ref 26.0–34.0)
MCHC: 34 g/dL (ref 30.0–36.0)
MCV: 93.1 fL (ref 80.0–100.0)
Monocytes Absolute: 0.4 10*3/uL (ref 0.1–1.0)
Monocytes Relative: 3 %
Neutro Abs: 11 10*3/uL — ABNORMAL HIGH (ref 1.7–7.7)
Neutrophils Relative %: 78 %
Platelets: 452 10*3/uL — ABNORMAL HIGH (ref 150–400)
RBC: 3.76 MIL/uL — ABNORMAL LOW (ref 4.22–5.81)
RDW: 12.7 % (ref 11.5–15.5)
WBC: 14.1 10*3/uL — ABNORMAL HIGH (ref 4.0–10.5)
nRBC: 0 % (ref 0.0–0.2)
nRBC: 1 /100 WBC — ABNORMAL HIGH

## 2021-12-13 LAB — GLUCOSE, CAPILLARY
Glucose-Capillary: 135 mg/dL — ABNORMAL HIGH (ref 70–99)
Glucose-Capillary: 132 mg/dL — ABNORMAL HIGH (ref 70–99)
Glucose-Capillary: 128 mg/dL — ABNORMAL HIGH (ref 70–99)
Glucose-Capillary: 159 mg/dL — ABNORMAL HIGH (ref 70–99)

## 2021-12-13 LAB — BASIC METABOLIC PANEL
Anion gap: 7 (ref 5–15)
BUN: 12 mg/dL (ref 6–20)
CO2: 27 mmol/L (ref 22–32)
Calcium: 8.9 mg/dL (ref 8.9–10.3)
Chloride: 105 mmol/L (ref 98–111)
Creatinine, Ser: 0.67 mg/dL (ref 0.61–1.24)
GFR, Estimated: >60 mL/min (ref >60)
Glucose, Bld: 131 mg/dL — ABNORMAL HIGH (ref 70–99)
Potassium: 4.2 mmol/L (ref 3.5–5.1)
Sodium: 139 mmol/L (ref 135–145)

## 2021-12-13 LAB — RETICULOCYTES
Immature Retic Fract: 20.5 % — ABNORMAL HIGH (ref 2.3–15.9)
RBC.: 3.99 MIL/uL — ABNORMAL LOW (ref 4.22–5.81)
Retic Count, Absolute: 99.4 10*3/uL (ref 19.0–186.0)
Retic Ct Pct: 2.5 % (ref 0.4–3.1)

## 2021-12-13 LAB — C-REACTIVE PROTEIN: CRP: 0.6 mg/dL (ref <1.0)

## 2021-12-13 LAB — PATHOLOGIST SMEAR REVIEW

## 2021-12-13 LAB — SEDIMENTATION RATE: Sed Rate: 18 mm/hr — ABNORMAL HIGH (ref 0–16)

## 2021-12-13 MED ORDER — VITAMIN D (ERGOCALCIFEROL) 1.25 MG (50000 UNIT) PO CAPS
50000.0000 [IU] | ORAL_CAPSULE | ORAL | Status: DC
  Administered 2021-12-18 – 2021-12-25 (×2): 50000 [IU] via ORAL
  Filled 2021-12-13 (×2): qty 1

## 2021-12-13 MED ORDER — DIPHENHYDRAMINE HCL 12.5 MG/5ML PO ELIX: 12.5000 mg | ORAL_SOLUTION | Freq: Four times a day (QID) | ORAL | Status: DC | PRN

## 2021-12-13 MED ORDER — PREDNISONE 10 MG (21) PO TBPK: 10.0000 mg | ORAL_TABLET | Freq: Four times a day (QID) | ORAL | Status: DC

## 2021-12-13 MED ORDER — MANAGING BACK PAIN BOOK
Freq: Once | Status: AC
  Filled 2021-12-13: qty 1

## 2021-12-13 MED ORDER — TRAZODONE HCL 50 MG PO TABS
25.0000 mg | ORAL_TABLET | Freq: Every evening | ORAL | Status: DC | PRN
  Administered 2021-12-13 – 2021-12-21 (×9): 50 mg via ORAL
  Filled 2021-12-13 (×9): qty 1

## 2021-12-13 MED ORDER — INSULIN ASPART 100 UNIT/ML IJ SOLN
0.0000 [IU] | Freq: Three times a day (TID) | INTRAMUSCULAR | Status: DC
  Administered 2021-12-13 – 2021-12-14 (×3): 2 [IU] via SUBCUTANEOUS
  Administered 2021-12-15: 3 [IU] via SUBCUTANEOUS
  Administered 2021-12-16 – 2021-12-17 (×2): 2 [IU] via SUBCUTANEOUS
  Administered 2021-12-17: 3 [IU] via SUBCUTANEOUS
  Administered 2021-12-18 – 2021-12-22 (×3): 2 [IU] via SUBCUTANEOUS
  Administered 2021-12-24: 3 [IU] via SUBCUTANEOUS

## 2021-12-13 MED ORDER — PREDNISONE 5 MG PO TABS
10.0000 mg | ORAL_TABLET | Freq: Two times a day (BID) | ORAL | Status: AC
  Administered 2021-12-13: 10 mg via ORAL
  Filled 2021-12-13: qty 2

## 2021-12-13 MED ORDER — PREGABALIN 75 MG PO CAPS
75.0000 mg | ORAL_CAPSULE | Freq: Once | ORAL | Status: AC
  Administered 2021-12-13: 75 mg via ORAL
  Filled 2021-12-13: qty 1

## 2021-12-13 MED ORDER — ATORVASTATIN CALCIUM 40 MG PO TABS
40.0000 mg | ORAL_TABLET | Freq: Every day | ORAL | Status: DC
  Administered 2021-12-14 – 2021-12-25 (×12): 40 mg via ORAL
  Filled 2021-12-13 (×12): qty 1

## 2021-12-13 MED ORDER — DOCUSATE SODIUM 100 MG PO CAPS
100.0000 mg | ORAL_CAPSULE | Freq: Two times a day (BID) | ORAL | Status: DC
  Administered 2021-12-13 – 2021-12-25 (×23): 100 mg via ORAL
  Filled 2021-12-13 (×24): qty 1

## 2021-12-13 MED ORDER — PANTOPRAZOLE SODIUM 40 MG PO TBEC
40.0000 mg | DELAYED_RELEASE_TABLET | Freq: Every day | ORAL | Status: DC
  Administered 2021-12-13 – 2021-12-16 (×4): 40 mg via ORAL
  Filled 2021-12-13 (×4): qty 1

## 2021-12-13 MED ORDER — FLEET ENEMA 7-19 GM/118ML RE ENEM
1.0000 | ENEMA | Freq: Once | RECTAL | Status: DC | PRN
Start: 2021-12-13 — End: 2021-12-25

## 2021-12-13 MED ORDER — MORPHINE SULFATE ER 15 MG PO TBCR
15.0000 mg | EXTENDED_RELEASE_TABLET | Freq: Two times a day (BID) | ORAL | Status: DC
  Administered 2021-12-13 – 2021-12-16 (×6): 15 mg via ORAL
  Filled 2021-12-13 (×6): qty 1

## 2021-12-13 MED ORDER — ASPIRIN 81 MG PO TBEC
81.0000 mg | DELAYED_RELEASE_TABLET | Freq: Every day | ORAL | Status: DC
  Administered 2021-12-14 – 2021-12-25 (×12): 81 mg via ORAL
  Filled 2021-12-13 (×12): qty 1

## 2021-12-13 MED ORDER — EMPAGLIFLOZIN 25 MG PO TABS
25.0000 mg | ORAL_TABLET | Freq: Every day | ORAL | Status: DC
  Administered 2021-12-14 – 2021-12-25 (×12): 25 mg via ORAL
  Filled 2021-12-13 (×12): qty 1

## 2021-12-13 MED ORDER — HYDROCODONE-ACETAMINOPHEN 7.5-325 MG PO TABS: 1.0000 | ORAL_TABLET | Freq: Four times a day (QID) | ORAL | Status: DC

## 2021-12-13 MED ORDER — PROCHLORPERAZINE EDISYLATE 10 MG/2ML IJ SOLN: 5.0000 mg | Freq: Four times a day (QID) | INTRAMUSCULAR | Status: DC | PRN

## 2021-12-13 MED ORDER — LORATADINE 10 MG PO TABS
10.0000 mg | ORAL_TABLET | Freq: Every day | ORAL | Status: DC
  Administered 2021-12-14 – 2021-12-24 (×11): 10 mg via ORAL
  Filled 2021-12-13 (×11): qty 1

## 2021-12-13 MED ORDER — PREDNISONE 5 MG PO TABS
5.0000 mg | ORAL_TABLET | Freq: Two times a day (BID) | ORAL | Status: AC
Start: 2021-12-14 — End: 2021-12-14
  Administered 2021-12-14 (×2): 5 mg via ORAL
  Filled 2021-12-13 (×2): qty 1

## 2021-12-13 MED ORDER — MAGNESIUM HYDROXIDE 400 MG/5ML PO SUSP
30.0000 mL | Freq: Every day | ORAL | Status: DC | PRN
  Filled 2021-12-13: qty 30

## 2021-12-13 MED ORDER — PROCHLORPERAZINE MALEATE 5 MG PO TABS: 5.0000 mg | ORAL_TABLET | Freq: Four times a day (QID) | ORAL | Status: DC | PRN

## 2021-12-13 MED ORDER — BISACODYL 5 MG PO TBEC
5.0000 mg | DELAYED_RELEASE_TABLET | Freq: Every day | ORAL | Status: DC | PRN
Start: 2021-12-13 — End: 2021-12-25

## 2021-12-13 MED ORDER — HYDROCODONE-ACETAMINOPHEN 7.5-325 MG PO TABS: 1.0000 | ORAL_TABLET | ORAL | Status: DC | PRN

## 2021-12-13 MED ORDER — PREDNISONE 5 MG PO TABS
5.0000 mg | ORAL_TABLET | Freq: Every day | ORAL | Status: AC
  Administered 2021-12-15: 5 mg via ORAL
  Filled 2021-12-13: qty 1

## 2021-12-13 MED ORDER — HYDROCODONE-ACETAMINOPHEN 7.5-325 MG PO TABS: 2.0000 | ORAL_TABLET | ORAL | Status: DC | PRN

## 2021-12-13 MED ORDER — PREGABALIN 75 MG PO CAPS: 75.0000 mg | ORAL_CAPSULE | Freq: Two times a day (BID) | ORAL | Status: DC

## 2021-12-13 MED ORDER — INSULIN ASPART 100 UNIT/ML IJ SOLN
4.0000 [IU] | Freq: Three times a day (TID) | INTRAMUSCULAR | Status: DC
  Administered 2021-12-13 – 2021-12-20 (×17): 4 [IU] via SUBCUTANEOUS

## 2021-12-13 MED ORDER — PREDNISONE 5 MG PO TABS
2.5000 mg | ORAL_TABLET | Freq: Every day | ORAL | Status: AC
  Administered 2021-12-16: 2.5 mg via ORAL
  Filled 2021-12-13: qty 1

## 2021-12-13 MED ORDER — OXYCODONE HCL 5 MG PO TABS
5.0000 mg | ORAL_TABLET | ORAL | Status: DC | PRN
  Administered 2021-12-13 – 2021-12-15 (×7): 10 mg via ORAL
  Filled 2021-12-13 (×7): qty 2

## 2021-12-13 MED ORDER — LOSARTAN POTASSIUM 50 MG PO TABS
25.0000 mg | ORAL_TABLET | Freq: Every day | ORAL | Status: DC
  Administered 2021-12-14 – 2021-12-25 (×12): 25 mg via ORAL
  Filled 2021-12-13 (×12): qty 1

## 2021-12-13 MED ORDER — LIVING WELL WITH DIABETES BOOK
Freq: Once | Status: AC
Start: 2021-12-13 — End: 2021-12-13
  Filled 2021-12-13: qty 1

## 2021-12-13 MED ORDER — AMLODIPINE BESYLATE 10 MG PO TABS
10.0000 mg | ORAL_TABLET | Freq: Every day | ORAL | Status: DC
  Administered 2021-12-14 – 2021-12-25 (×12): 10 mg via ORAL
  Filled 2021-12-13 (×12): qty 1

## 2021-12-13 MED ORDER — PROCHLORPERAZINE 25 MG RE SUPP: 12.5000 mg | Freq: Four times a day (QID) | RECTAL | Status: DC | PRN

## 2021-12-13 MED ORDER — PREGABALIN 75 MG PO CAPS
150.0000 mg | ORAL_CAPSULE | Freq: Two times a day (BID) | ORAL | Status: DC
  Administered 2021-12-14 – 2021-12-16 (×5): 150 mg via ORAL
  Filled 2021-12-13 (×5): qty 2

## 2021-12-13 MED ORDER — METHOCARBAMOL 500 MG PO TABS
500.0000 mg | ORAL_TABLET | Freq: Four times a day (QID) | ORAL | Status: DC | PRN
  Administered 2021-12-13 – 2021-12-25 (×27): 500 mg via ORAL
  Filled 2021-12-13 (×27): qty 1

## 2021-12-13 MED ORDER — ALUM & MAG HYDROXIDE-SIMETH 200-200-20 MG/5ML PO SUSP: 30.0000 mL | ORAL | Status: DC | PRN

## 2021-12-13 MED ORDER — GUAIFENESIN-DM 100-10 MG/5ML PO SYRP: 5.0000 mL | ORAL_SOLUTION | Freq: Four times a day (QID) | ORAL | Status: DC | PRN

## 2021-12-13 NOTE — Progress Notes (Signed)
Inpatient Rehabilitation Admissions Coordinator   I have insurance approval and can admit him to CIR today. Acute team made aware.  Ottie Glazier, RN, MSN Rehab Admissions Coordinator [phone] 12/13/2021 8:28 AM

## 2021-12-13 NOTE — H&P (Signed)
Physical Medicine and Rehabilitation Admission H&P     CC: Functional deficits due to Lumbar myelopathy.     HPI: Scott Tucker is a 56 year old male with history of CAD, T2DM, h/o Covid 19,  ACDF, lumbar spondylolisthesis w/stenosis and chronic LBP X 1 year who was admitted on 12/04/21 for transforaminal fusion L3/4 and L4/5 with removal of hardware L5-S1 and instrumentation L3-S! With rods, screws and cages by Dr. Otelia Sergeant. Post op limited by pain, constipation w/abdominal distension, rise in WBC to 16.1 and continues to fluctuate as well as weakness with LLE with foot drop. Steroid dose pack added 07/30 and CT lumbar spine done 07/31 revealing stable hardware with improvement in thecal sac and left foraminal patency, incidental finding of right renal nephrolithiasis and no acute findings.  Lyrica titrated up for mangement of left thigh neuropathic pain. WBC continues to fluctuate but remains afebrile and ABLA stable. Therapy has been working with patient who continues to be limited by pain and weakness LLE. CIR recommended due to functional decline.   Pt reports LBM this AM; peeing OK Pain is at best 8/10- is "awful"- burning and aching and throbbing and pulsing.  Also has memory issues per pt - wants people to talk to girlfriend about med/changes.   Review of Systems  Constitutional:  Negative for chills and fever.  HENT:  Positive for sore throat. Negative for hearing loss.   Eyes:  Negative for blurred vision and double vision.  Respiratory:  Negative for cough and shortness of breath.   Cardiovascular:  Negative for chest pain and leg swelling.  Gastrointestinal:  Negative for abdominal pain, nausea and vomiting.  Musculoskeletal:  Positive for myalgias.       Left hip , lateral thigh pain  Neurological:  Positive for headaches. Negative for dizziness.  All other systems reviewed and are negative.    Past Medical History:  Diagnosis Date   Anginal pain (HCC)    Anxiety     Arthritis    Chronic back pain    Coronary artery disease    Diabetes mellitus without complication (HCC)    type 2   Dyspnea    Headache    HTN (hypertension)    Neuromuscular disorder (HCC)     Past Surgical History:  Procedure Laterality Date   ANTERIOR CERVICAL DECOMP/DISCECTOMY FUSION N/A 12/04/2020   Procedure: ANTERIOR CERVICAL DISCECTOMY FUSION C3-4, C4-5, ALLOGRAFT, PLATE;  Surgeon: Eldred Manges, MD;  Location: [facility] OR;  Service: Orthopedics;  Laterality: N/A;  needs RNFA   BACK SURGERY     1992 and 2003(dr kritzer)   CARDIAC CATHETERIZATION     SHOULDER SURGERY Right    2017   TONSILLECTOMY     removed as a child    Family History  Problem Relation Age of Onset   Heart disease Maternal Uncle     Social History: Lives alone in 2 level home. Used cane PTA but limited by pain.  reports that he has never smoked. He has never used smokeless tobacco. He reports current alcohol use of about 3.0 standard drinks of alcohol per week. He reports current drug use. Drug: Marijuana.   Allergies  Allergen Reactions   Latex Rash    Medications Prior to Admission  Medication Sig Dispense Refill   amLODipine (NORVASC) 10 MG tablet Take 1 tablet (10 mg total) by mouth once daily. 90 tablet 1   FLUoxetine (PROZAC) 20 MG capsule Take 20 mg by mouth daily.  HYDROcodone-acetaminophen (NORCO/VICODIN) 5-325 MG tablet Take 1 tablet by mouth every 12 (twelve) hours as needed for moderate pain. 30 tablet 0   losartan (COZAAR) 25 MG tablet Take 1 tablet (25 mg total) by mouth once daily. (Please make overdue appt with Dr. Eldridge DaceVaranasi before anymore refills. Thank you 1st attempt) 90 tablet 1   nitroGLYCERIN (NITROSTAT) 0.4 MG SL tablet Place 1 tablet (0.4 mg total) under the tongue every 5 (five) minutes as needed for chest pain. 75 tablet 1   aspirin EC 81 MG tablet Take 1 tablet (81 mg total) by mouth daily. Swallow whole. (Patient not taking: Reported on 12/13/2021) 30 tablet 12    atorvastatin (LIPITOR) 40 MG tablet Take 1 tablet (40 mg total) by mouth daily. (Patient not taking: Reported on 12/13/2021) 90 tablet 3   cetirizine (ZYRTEC) 10 MG tablet Take 1 tablet (10 mg total) by mouth daily. (Patient not taking: Reported on 12/13/2021) 30 tablet 11   ciclopirox (PENLAC) 8 % solution Apply topically at bedtime. Apply over nail and surrounding skin. Apply daily over previous coat. After seven (7) days, may remove with alcohol and continue cycle. (Patient not taking: Reported on 12/13/2021) 6.6 mL 0   docusate sodium (COLACE) 100 MG capsule Take 1 capsule (100 mg total) by mouth 2 (two) times daily. (Patient not taking: Reported on 12/13/2021) 10 capsule 0   empagliflozin (JARDIANCE) 25 MG TABS tablet Take 1 tablet (25 mg total) by mouth once daily before breakfast. (Patient not taking: Reported on 12/13/2021) 90 tablet 1   ergocalciferol (VITAMIN D2) 1.25 MG (50000 UT) capsule Take 1 capsule (50,000 Units total) by mouth once a week. (Patient not taking: Reported on 11/28/2021) 12 capsule 0   HYDROcodone-acetaminophen (NORCO) 7.5-325 MG tablet Take 1-2 tablets by mouth every 6 (six) hours as needed for severe pain ((score 7 to 10)). (Patient not taking: Reported on 12/13/2021) 40 tablet 0   methocarbamol (ROBAXIN) 500 MG tablet Take 1 tablet (500 mg total) by mouth every 6 (six) hours as needed for muscle spasms. (Patient not taking: Reported on 12/13/2021) 40 tablet 1   pregabalin (LYRICA) 50 MG capsule Take 1 capsule (50 mg total) by mouth 2 (two) times daily. (Patient not taking: Reported on 12/13/2021) 60 capsule 0   traZODone (DESYREL) 50 MG tablet Take 0.5-1 tablets (25-50 mg total) by mouth once nightly at bedtime as needed for sleep. (Patient not taking: Reported on 12/13/2021) 30 tablet 3      Home: Home Living Family/patient expects to be discharged to:: Private residence Living Arrangements: Alone   Functional History:    Functional Status:  Mobility:           ADL:   Cognition:      Blood pressure 128/65, pulse 85, temperature 98.4 F (36.9 C), temperature source Oral, resp. rate 20, height 5\' 6"  (1.676 m), weight 86.2 kg, SpO2 96 %. Physical Exam Vitals and nursing note reviewed.  Constitutional:      Comments: Mild distress due to pain Holding self stiffly- no movement unless absolutely has to- appears younger than stated age; NAD  HENT:     Head: Normocephalic and atraumatic.     Right Ear: External ear normal.     Left Ear: External ear normal.     Nose: Nose normal. No congestion.     Mouth/Throat:     Mouth: Mucous membranes are dry.     Pharynx: Oropharynx is clear. No oropharyngeal exudate.  Eyes:     General:  Right eye: No discharge.        Left eye: No discharge.     Extraocular Movements: Extraocular movements intact.     Pupils: Pupils are equal, round, and reactive to light.  Cardiovascular:     Rate and Rhythm: Normal rate and regular rhythm.     Heart sounds: Normal heart sounds. No murmur heard.    No gallop.  Pulmonary:     Effort: Pulmonary effort is normal. No respiratory distress.     Breath sounds: Normal breath sounds. No wheezing, rhonchi or rales.  Abdominal:     General: Bowel sounds are normal. There is no distension.     Palpations: Abdomen is soft.     Tenderness: There is no abdominal tenderness.  Musculoskeletal:     Cervical back: Neck supple. No tenderness.     Right lower leg: No edema.     Left lower leg: No edema.     Comments: UE 5/5 B/L  LE's 4/5- limited due to pain, but DF/PF also appear a little weak B/L   Skin:    General: Skin is warm and dry.     Comments: Pt couldn't allow me to assess incision- couldn't turn over  Neurological:     General: No focal deficit present.     Mental Status: He is alert and oriented to person, place, and time.     Comments: Decreased to light touch from L knee down to toes Intact on RLE  Psychiatric:     Comments: Flat, slightly anxious  about pain     Results for orders placed or performed during the hospital encounter of 12/04/21 (from the past 48 hour(s))  Glucose, capillary     Status: Abnormal   Collection Time: 12/11/21  8:42 PM  Result Value Ref Range   Glucose-Capillary 147 (H) 70 - 99 mg/dL    Comment: Glucose reference range applies only to samples taken after fasting for at least 8 hours.  Glucose, capillary     Status: Abnormal   Collection Time: 12/12/21  7:26 AM  Result Value Ref Range   Glucose-Capillary 102 (H) 70 - 99 mg/dL    Comment: Glucose reference range applies only to samples taken after fasting for at least 8 hours.  Glucose, capillary     Status: Abnormal   Collection Time: 12/12/21 11:07 AM  Result Value Ref Range   Glucose-Capillary 164 (H) 70 - 99 mg/dL    Comment: Glucose reference range applies only to samples taken after fasting for at least 8 hours.  Glucose, capillary     Status: Abnormal   Collection Time: 12/12/21  4:50 PM  Result Value Ref Range   Glucose-Capillary 169 (H) 70 - 99 mg/dL    Comment: Glucose reference range applies only to samples taken after fasting for at least 8 hours.  Glucose, capillary     Status: Abnormal   Collection Time: 12/12/21  7:21 PM  Result Value Ref Range   Glucose-Capillary 132 (H) 70 - 99 mg/dL    Comment: Glucose reference range applies only to samples taken after fasting for at least 8 hours.  CBC with Differential/Platelet     Status: Abnormal   Collection Time: 12/13/21  1:46 AM  Result Value Ref Range   WBC 14.1 (H) 4.0 - 10.5 K/uL   RBC 3.76 (L) 4.22 - 5.81 MIL/uL   Hemoglobin 11.9 (L) 13.0 - 17.0 g/dL   HCT 34.7 (L) 42.5 - 95.6 %   MCV 93.1  80.0 - 100.0 fL   MCH 31.6 26.0 - 34.0 pg   MCHC 34.0 30.0 - 36.0 g/dL   RDW 40.9 81.1 - 91.4 %   Platelets 452 (H) 150 - 400 K/uL   nRBC 0.0 0.0 - 0.2 %   Neutrophils Relative % 78 %   Neutro Abs 11.0 (H) 1.7 - 7.7 K/uL   Lymphocytes Relative 18 %   Lymphs Abs 2.5 0.7 - 4.0 K/uL    Monocytes Relative 3 %   Monocytes Absolute 0.4 0.1 - 1.0 K/uL   Eosinophils Relative 0 %   Eosinophils Absolute 0.0 0.0 - 0.5 K/uL   Basophils Relative 0 %   Basophils Absolute 0.0 0.0 - 0.1 K/uL   nRBC 1 (H) 0 /100 WBC   Blasts 1 %   Abs Immature Granulocytes 0.00 0.00 - 0.07 K/uL    Comment: Performed at [facility], [address], [place], [place] [address]  Basic metabolic panel     Status: Abnormal   Collection Time: 12/13/21  1:46 AM  Result Value Ref Range   Sodium 139 135 - 145 mmol/L   Potassium 4.2 3.5 - 5.1 mmol/L   Chloride 105 98 - 111 mmol/L   CO2 27 22 - 32 mmol/L   Glucose, Bld 131 (H) 70 - 99 mg/dL    Comment: Glucose reference range applies only to samples taken after fasting for at least 8 hours.   BUN 12 6 - 20 mg/dL   Creatinine, Ser 6.21 0.61 - 1.24 mg/dL   Calcium 8.9 8.9 - 30.8 mg/dL   GFR, Estimated >65 >78 mL/min    Comment: (NOTE) Calculated using the CKD-EPI Creatinine Equation (2021)    Anion gap 7 5 - 15    Comment: Performed at [facility], [address] [place], [place] [address]  Pathologist smear review     Status: None   Collection Time: 12/13/21  1:46 AM  Result Value Ref Range   Path Review Neutrophilia and thrombocytosis.     Comment: No basophilia or blasts identified. Reviewed by Jesse Fall. Unknown Foley, M.D. 12/13/2021 Performed at [facility], [address], [place], [place] [address]   Glucose, capillary     Status: Abnormal   Collection Time: 12/13/21  7:47 AM  Result Value Ref Range   Glucose-Capillary 135 (H) 70 - 99 mg/dL    Comment: Glucose reference range applies only to samples taken after fasting for at least 8 hours.  Glucose, capillary     Status: Abnormal   Collection Time: 12/13/21 11:15 AM  Result Value Ref Range   Glucose-Capillary 132 (H) 70 - 99 mg/dL    Comment: Glucose reference range applies only to samples taken after fasting for at least 8 hours.  C-reactive protein     Status: None    Collection Time: 12/13/21 11:31 AM  Result Value Ref Range   CRP 0.6 <1.0 mg/dL    Comment: Performed at [facility], [address], [place], [place] [address]  Sedimentation rate     Status: Abnormal   Collection Time: 12/13/21 11:31 AM  Result Value Ref Range   Sed Rate 18 (H) 0 - 16 mm/hr    Comment: Performed at [facility], [address], [place], [place] [address]  Reticulocytes     Status: Abnormal   Collection Time: 12/13/21 11:31 AM  Result Value Ref Range   Retic Ct Pct 2.5 0.4 - 3.1 %  RBC. 3.99 (L) 4.22 - 5.81 MIL/uL   Retic Count, Absolute 99.4 19.0 - 186.0 K/uL   Immature Retic Fract 20.5 (H) 2.3 - 15.9 %    Comment: Performed at [facility], [address] [place], [place] [address]   DG Chest 2 View  Result Date: 12/13/2021 CLINICAL DATA:  Recent lumbar surgery, not feeling well EXAM: CHEST - 2 VIEW COMPARISON:  03/13/2020 FINDINGS: Chronic elevation of the right hemidiaphragm. Cardiac and mediastinal contours are within normal limits. No focal pulmonary opacity. No pleural effusion or pneumothorax. No acute osseous abnormality. IMPRESSION: No acute cardiopulmonary process. Electronically Signed   By: Wiliam Ke M.D.   On: 12/13/2021 12:52      Blood pressure 128/65, pulse 85, temperature 98.4 F (36.9 C), temperature source Oral, resp. rate 20, height 5\' 6"  (1.676 m), weight 86.2 kg, SpO2 96 %.  Medical Problem List and Plan: 1. Functional deficits secondary to lumbar stenosis s/p lumbar fusion L3-4, L4-5  -patient may  shower-cover incision  -ELOS/Goals: 7-10 days- supervision 2.  Antithrombotics: -DVT/anticoagulation:  Mechanical: Sequential compression devices, below knee Bilateral lower extremities--will check dopplers due to immobility/LLE weakness.   -antiplatelet therapy: aspirin 81 mg daily 3. Pain Management: MS contin add for more consistent pain relief.  --Norco changed to oxycodone as getting close to 4 gram tylenol. 5-10 mg  q4 hours prn --continue robaxin prn for muscle spasms.   -Lyrica 75 mg BID- increased as of 8/56 yo 150 mg BID  -added MS Contin 15 mg BID and d/c'd scheduled Norco and prn Norco  -K-pad 4. Mood/Behavior/Sleep: LCSW to evaluate and provide emotional support  -antipsychotic agents: n/a 5. Neuropsych/cognition: This patient is capable of making decisions on his own behalf. 6. Skin/Wound Care: Routine skin care checks  -monitor surgical incision 7. Fluids/Electrolytes/Nutrition: Routine Is and Os and follow-up chemistries 8: S/p Lumbar fusion Dr. 10/4 7/25. Maintain Aspen brace  -continue prednisone taper  9: Hypertension: continue Norvasc, Cozaar 10: Hyperlipidemia: continue Lipitor 11: DM-2: CBGs QID  -continue Jardiance, insulin 4 units with meals, SS 12. Leucocytosis: WBC 16.1 on POD#1--likely reactive  with + steroids 7/30.   --Continue to monitor for signs of infection. CRP-0.6 (on 08/03)  --recheck CBC in am. Steroids being tapered off.  13. Vitamin D deficiency: 06/15 labs w/Vit D-10.3. Continue Ergocalciferol weekly.    I have personally performed a face to face diagnostic evaluation of this patient and formulated the key components of the plan.  Additionally, I have personally reviewed laboratory data, imaging studies, as well as relevant notes and concur with the physician assistant's documentation above.   The patient's status has not changed from the original H&P.  Any changes in documentation from the acute care chart have been noted above.         7/15, PA-C 12/13/2021

## 2021-12-13 NOTE — H&P (Signed)
Physical Medicine and Rehabilitation Admission H&P       CC: Functional deficits due to Lumbar myelopathy.      HPI: Scott Tucker is a 56 year old male with history of CAD, T2DM, h/o Covid 19,  ACDF, lumbar spondylolisthesis w/stenosis and chronic LBP X 1 year who was admitted on 12/04/21 for transforaminal fusion L3/4 and L4/5 with removal of hardware L5-S1 and instrumentation L3-S! With rods, screws and cages by Dr. Otelia Sergeant. Post op limited by pain, constipation w/abdominal distension, rise in WBC to 16.1 and continues to fluctuate as well as weakness with LLE with foot drop. Steroid dose pack added 07/30 and CT lumbar spine done 07/31 revealing stable hardware with improvement in thecal sac and left foraminal patency, incidental finding of right renal nephrolithiasis and no acute findings.  Lyrica titrated up for mangement of left thigh neuropathic pain. WBC continues to fluctuate but remains afebrile and ABLA stable. Therapy has been working with patient who continues to be limited by pain and weakness LLE. CIR recommended due to functional decline.    Pt reports LBM this AM; peeing OK Pain is at best 8/10- is "awful"- burning and aching and throbbing and pulsing.  Also has memory issues per pt - wants people to talk to girlfriend about med/changes.    Review of Systems  Constitutional:  Negative for chills and fever.  HENT:  Positive for sore throat. Negative for hearing loss.   Eyes:  Negative for blurred vision and double vision.  Respiratory:  Negative for cough and shortness of breath.   Cardiovascular:  Negative for chest pain and leg swelling.  Gastrointestinal:  Negative for abdominal pain, nausea and vomiting.  Musculoskeletal:  Positive for myalgias.       Left hip , lateral thigh pain  Neurological:  Positive for headaches. Negative for dizziness.  All other systems reviewed and are negative.           Past Medical History:  Diagnosis Date   Anginal pain (HCC)      Anxiety     Arthritis     Chronic back pain     Coronary artery disease     Diabetes mellitus without complication (HCC)      type 2   Dyspnea     Headache     HTN (hypertension)     Neuromuscular disorder (HCC)             Past Surgical History:  Procedure Laterality Date   ANTERIOR CERVICAL DECOMP/DISCECTOMY FUSION N/A 12/04/2020    Procedure: ANTERIOR CERVICAL DISCECTOMY FUSION C3-4, C4-5, ALLOGRAFT, PLATE;  Surgeon: Eldred Manges, MD;  Location: [facility] OR;  Service: Orthopedics;  Laterality: N/A;  needs RNFA   BACK SURGERY        1992 and 2003(dr kritzer)   CARDIAC CATHETERIZATION       SHOULDER SURGERY Right      2017   TONSILLECTOMY        removed as a child           Family History  Problem Relation Age of Onset   Heart disease Maternal Uncle        Social History: Lives alone in 2 level home. Used cane PTA but limited by pain.  reports that he has never smoked. He has never used smokeless tobacco. He reports current alcohol use of about 3.0 standard drinks of alcohol per week. He reports current drug use. Drug: Marijuana.  Allergies  Allergen Reactions   Latex Rash            Medications Prior to Admission  Medication Sig Dispense Refill   amLODipine (NORVASC) 10 MG tablet Take 1 tablet (10 mg total) by mouth once daily. 90 tablet 1   FLUoxetine (PROZAC) 20 MG capsule Take 20 mg by mouth daily.       HYDROcodone-acetaminophen (NORCO/VICODIN) 5-325 MG tablet Take 1 tablet by mouth every 12 (twelve) hours as needed for moderate pain. 30 tablet 0   losartan (COZAAR) 25 MG tablet Take 1 tablet (25 mg total) by mouth once daily. (Please make overdue appt with Dr. Eldridge Dace before anymore refills. Thank you 1st attempt) 90 tablet 1   nitroGLYCERIN (NITROSTAT) 0.4 MG SL tablet Place 1 tablet (0.4 mg total) under the tongue every 5 (five) minutes as needed for chest pain. 75 tablet 1   aspirin EC 81 MG tablet Take 1 tablet (81 mg total) by mouth daily. Swallow  whole. (Patient not taking: Reported on 12/13/2021) 30 tablet 12   atorvastatin (LIPITOR) 40 MG tablet Take 1 tablet (40 mg total) by mouth daily. (Patient not taking: Reported on 12/13/2021) 90 tablet 3   cetirizine (ZYRTEC) 10 MG tablet Take 1 tablet (10 mg total) by mouth daily. (Patient not taking: Reported on 12/13/2021) 30 tablet 11   ciclopirox (PENLAC) 8 % solution Apply topically at bedtime. Apply over nail and surrounding skin. Apply daily over previous coat. After seven (7) days, may remove with alcohol and continue cycle. (Patient not taking: Reported on 12/13/2021) 6.6 mL 0   docusate sodium (COLACE) 100 MG capsule Take 1 capsule (100 mg total) by mouth 2 (two) times daily. (Patient not taking: Reported on 12/13/2021) 10 capsule 0   empagliflozin (JARDIANCE) 25 MG TABS tablet Take 1 tablet (25 mg total) by mouth once daily before breakfast. (Patient not taking: Reported on 12/13/2021) 90 tablet 1   ergocalciferol (VITAMIN D2) 1.25 MG (50000 UT) capsule Take 1 capsule (50,000 Units total) by mouth once a week. (Patient not taking: Reported on 11/28/2021) 12 capsule 0   HYDROcodone-acetaminophen (NORCO) 7.5-325 MG tablet Take 1-2 tablets by mouth every 6 (six) hours as needed for severe pain ((score 7 to 10)). (Patient not taking: Reported on 12/13/2021) 40 tablet 0   methocarbamol (ROBAXIN) 500 MG tablet Take 1 tablet (500 mg total) by mouth every 6 (six) hours as needed for muscle spasms. (Patient not taking: Reported on 12/13/2021) 40 tablet 1   pregabalin (LYRICA) 50 MG capsule Take 1 capsule (50 mg total) by mouth 2 (two) times daily. (Patient not taking: Reported on 12/13/2021) 60 capsule 0   traZODone (DESYREL) 50 MG tablet Take 0.5-1 tablets (25-50 mg total) by mouth once nightly at bedtime as needed for sleep. (Patient not taking: Reported on 12/13/2021) 30 tablet 3          Home: Home Living Family/patient expects to be discharged to:: Private residence Living Arrangements: Alone   Functional  History:   Functional Status:  Mobility:   ADL:     Cognition:       Blood pressure 128/65, pulse 85, temperature 98.4 F (36.9 C), temperature source Oral, resp. rate 20, height 5\' 6"  (1.676 m), weight 86.2 kg, SpO2 96 %. Physical Exam Vitals and nursing note reviewed.  Constitutional:      Comments: Mild distress due to pain Holding self stiffly- no movement unless absolutely has to- appears younger than stated age; NAD  HENT:  Head: Normocephalic and atraumatic.     Right Ear: External ear normal.     Left Ear: External ear normal.     Nose: Nose normal. No congestion.     Mouth/Throat:     Mouth: Mucous membranes are dry.     Pharynx: Oropharynx is clear. No oropharyngeal exudate.  Eyes:     General:        Right eye: No discharge.        Left eye: No discharge.     Extraocular Movements: Extraocular movements intact.     Pupils: Pupils are equal, round, and reactive to light.  Cardiovascular:     Rate and Rhythm: Normal rate and regular rhythm.     Heart sounds: Normal heart sounds. No murmur heard.    No gallop.  Pulmonary:     Effort: Pulmonary effort is normal. No respiratory distress.     Breath sounds: Normal breath sounds. No wheezing, rhonchi or rales.  Abdominal:     General: Bowel sounds are normal. There is no distension.     Palpations: Abdomen is soft.     Tenderness: There is no abdominal tenderness.  Musculoskeletal:     Cervical back: Neck supple. No tenderness.     Right lower leg: No edema.     Left lower leg: No edema.     Comments: UE 5/5 B/L  LE's 4/5- limited due to pain, but DF/PF also appear a little weak B/L   Skin:    General: Skin is warm and dry.     Comments: Pt couldn't allow me to assess incision- couldn't turn over  Neurological:     General: No focal deficit present.     Mental Status: He is alert and oriented to person, place, and time.     Comments: Decreased to light touch from L knee down to toes Intact on RLE   Psychiatric:     Comments: Flat, slightly anxious about pain        Lab Results Last 48 Hours        Results for orders placed or performed during the hospital encounter of 12/04/21 (from the past 48 hour(s))  Glucose, capillary     Status: Abnormal    Collection Time: 12/11/21  8:42 PM  Result Value Ref Range    Glucose-Capillary 147 (H) 70 - 99 mg/dL      Comment: Glucose reference range applies only to samples taken after fasting for at least 8 hours.  Glucose, capillary     Status: Abnormal    Collection Time: 12/12/21  7:26 AM  Result Value Ref Range    Glucose-Capillary 102 (H) 70 - 99 mg/dL      Comment: Glucose reference range applies only to samples taken after fasting for at least 8 hours.  Glucose, capillary     Status: Abnormal    Collection Time: 12/12/21 11:07 AM  Result Value Ref Range    Glucose-Capillary 164 (H) 70 - 99 mg/dL      Comment: Glucose reference range applies only to samples taken after fasting for at least 8 hours.  Glucose, capillary     Status: Abnormal    Collection Time: 12/12/21  4:50 PM  Result Value Ref Range    Glucose-Capillary 169 (H) 70 - 99 mg/dL      Comment: Glucose reference range applies only to samples taken after fasting for at least 8 hours.  Glucose, capillary     Status: Abnormal  Collection Time: 12/12/21  7:21 PM  Result Value Ref Range    Glucose-Capillary 132 (H) 70 - 99 mg/dL      Comment: Glucose reference range applies only to samples taken after fasting for at least 8 hours.  CBC with Differential/Platelet     Status: Abnormal    Collection Time: 12/13/21  1:46 AM  Result Value Ref Range    WBC 14.1 (H) 4.0 - 10.5 K/uL    RBC 3.76 (L) 4.22 - 5.81 MIL/uL    Hemoglobin 11.9 (L) 13.0 - 17.0 g/dL    HCT 78.235.0 (L) 95.639.0 - 52.0 %    MCV 93.1 80.0 - 100.0 fL    MCH 31.6 26.0 - 34.0 pg    MCHC 34.0 30.0 - 36.0 g/dL    RDW 21.312.7 08.611.5 - 57.815.5 %    Platelets 452 (H) 150 - 400 K/uL    nRBC 0.0 0.0 - 0.2 %    Neutrophils  Relative % 78 %    Neutro Abs 11.0 (H) 1.7 - 7.7 K/uL    Lymphocytes Relative 18 %    Lymphs Abs 2.5 0.7 - 4.0 K/uL    Monocytes Relative 3 %    Monocytes Absolute 0.4 0.1 - 1.0 K/uL    Eosinophils Relative 0 %    Eosinophils Absolute 0.0 0.0 - 0.5 K/uL    Basophils Relative 0 %    Basophils Absolute 0.0 0.0 - 0.1 K/uL    nRBC 1 (H) 0 /100 WBC    Blasts 1 %    Abs Immature Granulocytes 0.00 0.00 - 0.07 K/uL      Comment: Performed at [facility], [address] [place], [place] [address]  Basic metabolic panel     Status: Abnormal    Collection Time: 12/13/21  1:46 AM  Result Value Ref Range    Sodium 139 135 - 145 mmol/L    Potassium 4.2 3.5 - 5.1 mmol/L    Chloride 105 98 - 111 mmol/L    CO2 27 22 - 32 mmol/L    Glucose, Bld 131 (H) 70 - 99 mg/dL      Comment: Glucose reference range applies only to samples taken after fasting for at least 8 hours.    BUN 12 6 - 20 mg/dL    Creatinine, Ser 9.520.67 0.61 - 1.24 mg/dL    Calcium 8.9 8.9 - 84.110.3 mg/dL    GFR, Estimated >32>60 >44>60 mL/min      Comment: (NOTE) Calculated using the CKD-EPI Creatinine Equation (2021)      Anion gap 7 5 - 15      Comment: Performed at [facility], [address] [place], [place] [address]  Pathologist smear review     Status: None    Collection Time: 12/13/21  1:46 AM  Result Value Ref Range    Path Review Neutrophilia and thrombocytosis.        Comment: No basophilia or blasts identified. Reviewed by Jesse FallJerald F. Unknown FoleyWolford, M.D. 12/13/2021 Performed at [facility], [address] [place], [place] [address]    Glucose, capillary     Status: Abnormal    Collection Time: 12/13/21  7:47 AM  Result Value Ref Range    Glucose-Capillary 135 (H) 70 - 99 mg/dL      Comment: Glucose reference range applies only to samples taken after fasting for at least 8 hours.  Glucose, capillary     Status: Abnormal    Collection  Time: 12/13/21 11:15 AM  Result Value Ref Range    Glucose-Capillary 132  (H) 70 - 99 mg/dL      Comment: Glucose reference range applies only to samples taken after fasting for at least 8 hours.  C-reactive protein     Status: None    Collection Time: 12/13/21 11:31 AM  Result Value Ref Range    CRP 0.6 <1.0 mg/dL      Comment: Performed at [facility], [address], [place], [place] [address]  Sedimentation rate     Status: Abnormal    Collection Time: 12/13/21 11:31 AM  Result Value Ref Range    Sed Rate 18 (H) 0 - 16 mm/hr      Comment: Performed at [facility], [address] [place], [place] [address]  Reticulocytes     Status: Abnormal    Collection Time: 12/13/21 11:31 AM  Result Value Ref Range    Retic Ct Pct 2.5 0.4 - 3.1 %    RBC. 3.99 (L) 4.22 - 5.81 MIL/uL    Retic Count, Absolute 99.4 19.0 - 186.0 K/uL    Immature Retic Fract 20.5 (H) 2.3 - 15.9 %      Comment: Performed at [facility], [address], [place], [place] [address]       Imaging Results (Last 48 hours)  DG Chest 2 View   Result Date: 12/13/2021 CLINICAL DATA:  Recent lumbar surgery, not feeling well EXAM: CHEST - 2 VIEW COMPARISON:  03/13/2020 FINDINGS: Chronic elevation of the right hemidiaphragm. Cardiac and mediastinal contours are within normal limits. No focal pulmonary opacity. No pleural effusion or pneumothorax. No acute osseous abnormality. IMPRESSION: No acute cardiopulmonary process. Electronically Signed   By: Wiliam Ke M.D.   On: 12/13/2021 12:52           Blood pressure 128/65, pulse 85, temperature 98.4 F (36.9 C), temperature source Oral, resp. rate 20, height 5\' 6"  (1.676 m), weight 86.2 kg, SpO2 96 %.   Medical Problem List and Plan: 1. Functional deficits secondary to lumbar stenosis s/p lumbar fusion L3-4, L4-5             -patient may  shower-cover incision             -ELOS/Goals: 7-10 days- supervision 2.  Antithrombotics: -DVT/anticoagulation:  Mechanical: Sequential compression devices, below knee Bilateral lower  extremities--will check dopplers due to immobility/LLE weakness.              -antiplatelet therapy: aspirin 81 mg daily 3. Pain Management: MS contin add for more consistent pain relief.  --Norco changed to oxycodone as getting close to 4 gram tylenol. 5-10 mg q4 hours prn --continue robaxin prn for muscle spasms.              -Lyrica 75 mg BID- increased as of 8/56 yo 150 mg BID             -added MS Contin 15 mg BID and d/c'd scheduled Norco and prn Norco             -K-pad 4. Mood/Behavior/Sleep: LCSW to evaluate and provide emotional support             -antipsychotic agents: n/a 5. Neuropsych/cognition: This patient is capable of making decisions on his own behalf. 6. Skin/Wound Care: Routine skin care checks             -monitor surgical incision 7. Fluids/Electrolytes/Nutrition: Routine Is and  Os and follow-up chemistries 8: S/p Lumbar fusion Dr. Otelia Sergeant 7/25. Maintain Aspen brace             -continue prednisone taper  9: Hypertension: continue Norvasc, Cozaar 10: Hyperlipidemia: continue Lipitor 11: DM-2: CBGs QID             -continue Jardiance, insulin 4 units with meals, SS 12. Leucocytosis: WBC 16.1 on POD#1--likely reactive  with + steroids 7/30.   --Continue to monitor for signs of infection. CRP-0.6 (on 08/03)             --recheck CBC in am. Steroids being tapered off.  13. Vitamin D deficiency: 06/15 labs w/Vit D-10.3. Continue Ergocalciferol weekly.      I have personally performed a face to face diagnostic evaluation of this patient and formulated the key components of the plan.  Additionally, I have personally reviewed laboratory data, imaging studies, as well as relevant notes and concur with the physician assistant's documentation above.   The patient's status has not changed from the original H&P.  Any changes in documentation from the acute care chart have been noted above.               Jacquelynn Cree, PA-C 12/13/2021

## 2021-12-13 NOTE — Plan of Care (Addendum)
Greeted patient in chair. Patient alert and oriented.  Oriented to rehab. Provided education on incision care, DM, diet modifications, HTN, HLD, CAD and back precautions.  Patient requested Managing back pain and Living with diabetes to be put in binder.  Informed him that it's his binder and to take home.  Encouraged to look at the material daily during down time. Offered videos if of same material. All needs met. Therapy to return.

## 2021-12-13 NOTE — Progress Notes (Signed)
Inpatient Rehabilitation Admission Medication Review by a Pharmacist   A complete drug regimen review was completed for this patient to identify any potInpatient Rehabilitation Admission Medication Review by a Pharmacist   A complete drug regimen review was completed for this patient to identify any potential clinically significant medication issues.   High Risk Drug Classes Is patient taking? Indication by Medication  Antipsychotic Yes Prochlorperazine for nausea  Anticoagulant No    Antibiotic No    Opioid Yes Morphine and oxycodone for post-op pain  Antiplatelet Yes Aspirin for CAD  Hypoglycemics/insulin Yes Insulin for DM   Vasoactive Medication Yes Amlodipine, losartan for HTN   Chemotherapy No    Other Yes Atorvastatin for HLD Pantoprazole for stress ulcer prophylaxis   Prednisone taper for inflammation Pregabalin for neuropathic pain  Ergocalciferol for supplementation  Methocarbamol for muscle spasms Trazodone for sleep        Type of Medication Issue Identified Description of Issue Recommendation(s)  Drug Interaction(s) (clinically significant)        Duplicate Therapy        Allergy        No Medication Administration End Date        Incorrect Dose   Prednisone taper pack order does not match the pack Called 5N RN who clarified that she was giving it as ordered on MAR rather than how the pack instructs. Called Dr. Otelia Sergeant to clarify that he would like a 6 day taper. Obtained approval to manually enter remaining doses to complete desired taper   Additional Drug Therapy Needed        Significant med changes from prior encounter (inform family/care partners about these prior to discharge). New prednisone taper, pantoprazole, pregabalin, insulin  Educate patient at discharge if continuing  Other   -Home nitroglycerin SL PRN not resumed -New pantoprazole for stress ulcer prophylaxis -Resume as appropriate   -Consider stop pantoprazole after steroid taper (not a home med)       Clinically significant medication issues were identified that warrant physician communication and completion of prescribed/recommended actions by midnight of the next day:  Yes   Name of provider notified for urgent issues identified: Dr. [name]   Provider Method of Notification: Phone Call        Pharmacist comments: Clinically significant medication issue with prednisone taper was resolved per above. Orders entered.   Time spent performing this drug regimen review (minutes):  20   Alphia Moh, PharmD, [place], [facility] Clinical Pharmacist   Please check AMION for all [facility] phone numbers After 10:00 PM, call Main Pharmacy [phone]  ential clinically significant medication issues.

## 2021-12-13 NOTE — Plan of Care (Signed)

## 2021-12-13 NOTE — Progress Notes (Signed)
Occupational Therapy Treatment Patient Details Name: Scott Tucker MRN: [id number] DOB: Apr 04, 1966 Today's Date: 12/13/2021   History of present illness Pt is a 56 y/o male s/p L3-4, L4-5, S1 fusion performed on 12/04/21. Pt with hx of spinal surgeries reporting first one completed in 1992.   OT comments  Pt up in recliner upon therapy arrival and agreeable to participate in OT evaluation. Therapist provided patient with education and HEP including seated abdominal isometric exercises. VC were provided for form and technique. Pain and/or discomfort was monitored with therapist provided modifications as needed for comfort and to maintain back precautions. Pt demonstrated and verbalized understanding. Handout provided and left on pt's tray table. All needs met. Planned discharged to [facility] today to continue focusing on deficits related to recent spinal surgery.    Recommendations for follow up therapy are one component of a multi-disciplinary discharge planning process, led by the attending physician.  Recommendations may be updated based on patient status, additional functional criteria and insurance authorization.    Follow Up Recommendations  Acute inpatient rehab (3hours/day)    Assistance Recommended at Discharge Intermittent Supervision/Assistance  Patient can return home with the following  A little help with walking and/or transfers;A lot of help with bathing/dressing/bathroom;Assistance with cooking/housework;Assist for transportation;Help with stairs or ramp for entrance         Precautions / Restrictions Precautions Precautions: Back Required Braces or Orthoses: Spinal Brace Restrictions Weight Bearing Restrictions: No        ADL either performed or assessed with clinical judgement      Cognition Arousal/Alertness: Awake/alert Behavior During Therapy: WFL for tasks assessed/performed Overall Cognitive Status: Within Functional Limits for tasks assessed             Exercises Other Exercises Other Exercises: Abdominal isometric exercises completed seated; Hip flexion & rectus abdominus, obliques, and hip adductors & obliques; 5" hold, 3 sets.            Pertinent Vitals/ Pain       Pain Assessment Pain Assessment: 0-10 Pain Score: 7  Pain Descriptors / Indicators: Grimacing, Guarding, Shooting Pain Intervention(s): Monitored during session, Limited activity within patient's tolerance, Premedicated before session         Frequency  Min 3X/week        Progress Toward Goals  OT Goals(current goals can now be found in the care plan section)  Progress towards OT goals: Progressing toward goals     Plan   Pt to discharge acute care and be admitted to [facility] for further intensive rehab.       AM-PAC OT "6 Clicks" Daily Activity     Outcome Measure   Help from another person eating meals?: None Help from another person taking care of personal grooming?: A Little Help from another person toileting, which includes using toliet, bedpan, or urinal?: A Lot Help from another person bathing (including washing, rinsing, drying)?: A Lot Help from another person to put on and taking off regular upper body clothing?: A Little Help from another person to put on and taking off regular lower body clothing?: A Lot 6 Click Score: 16    End of Session    OT Visit Diagnosis: Unsteadiness on feet (R26.81);Repeated falls (R29.6);Muscle weakness (generalized) (M62.81);Other symptoms and signs involving the nervous system (R29.898);Pain Pain - part of body:  (back; surgical pain)   Activity Tolerance Patient tolerated treatment well   Patient Left in chair;with call bell/phone within reach  Time: 4830-1599 OT Time Calculation (min): 20 min  Charges: OT General Charges $OT Visit: 1 Visit OT Treatments $Therapeutic Exercise: 8-22 mins  Ailene Ravel, OTR/L,CBIS  Supplemental OT - [facility] and [facility]   Chanah Tidmore, Clarene Duke 12/13/2021, 3:22  PM

## 2021-12-13 NOTE — Progress Notes (Signed)
Courtney Heys, MD  Physician Physical Medicine and Rehabilitation PMR Pre-admission    Signed Date of Service:  12/08/2021  2:42 PM  Related encounter: Admission (Discharged) from 12/04/2021 in [facility] Admission Coordinator Pre-Admission Assessment   Patient: Scott Tucker is an 56 y.o., male MRN: [id number] DOB: 1966-02-17 Height: 5' 6" (167.6 cm) Weight: 86.2 kg   Insurance Information HMO: yes    PPO:      PCP:      IPA:      80/20:      OTHER:  PRIMARY: Healthy Blue + Medicare  Policy#: [id number]      Subscriber: patient CM Name: Kennyth Lose     Phone#: [phone]     Fax#: [phone] Pre-Cert#: [id number] approved for 7 days. F/u with Kennyth Lose      Benefits:  Phone #: [phone]     Name:  Eff. Date: 07/11/21     Deduct: $0 (does not have) Out of Pocket Max: $8,300 ($264.60 met) Life Max: NA CIR: 100% coverage for days 1-60, $400 co-pay/day for days 61-90, $800 co-pay for days 91-150      SNF: 100% coverage for days 1-20, $200 co-pay/day for days 21-100 Outpatient: 80% coverage     Co-Pay: 20% co-insurance Home Health: 100% coverage      Co-Pay:  DME: 80% coverage     Co-Pay: 20% co-insurance Providers: in-network SECONDARY: Medicaid [place] Access    Policy#: [id number]     Phone#: [phone]   Financial Counselor:       Phone#:    The "Data Collection Information Summary" for patients in Inpatient Rehabilitation Facilities with attached "Privacy Act [place] Records" was provided and verbally reviewed with: Patient   Emergency Contact Information Contact Information       Name Relation Home Work Mobile    BERNARD,[name] Significant other     [phone]         Current Medical History  Patient Admitting Diagnosis: lumbar spondylolisthesis, s/p lumbar spinal fusion   History of Present Illness:  56 year old male with medical hx significant for: multilevel lumbar  stenosis, HTN, neuromuscular disorder, CAD, anxiety, DM II, arthritis.  Presented to [facility] on 12/04/21 for surgery d/t lumbar spondylolisthesis. Pt underwent L3-4, L4-5, L5-S1 fusion.    Postoperatively  had emesis the night of 7/27. KUB was unremarkable. Eventually  with B< on POD #3. Developed severe left leg pain and weakness. CT imaging showed no significant hardware or bone graft abnormalities. Felt to be residual neuropathic pain due to persistent prolonged nerve compression. Lyrica begun.    Patient's medical record from [facility] has been reviewed by the rehabilitation admission coordinator and physician.   Past Medical History      Past Medical History:  Diagnosis Date   Anginal pain ([place])     Anxiety     Arthritis     Chronic back pain     Coronary artery disease     Diabetes mellitus without complication ([place])      type 2   Dyspnea     Headache     HTN (hypertension)     Neuromuscular disorder ([place])      Has the patient had major surgery during 100 days prior to admission? Yes   Family History   family history includes Heart disease in his maternal uncle.   Current Medications   Current Facility-Administered Medications:  0.9 %  sodium chloride infusion, 250 mL, Intravenous, Continuous, Louanne Skye, Daleen Bo, MD   0.9 %  sodium chloride infusion, , Intravenous, Continuous, Jessy Oto, MD   acetaminophen (TYLENOL) tablet 650 mg, 650 mg, Oral, Q4H PRN, 650 mg at 12/09/21 2021 **OR** acetaminophen (TYLENOL) suppository 650 mg, 650 mg, Rectal, Q4H PRN, Jessy Oto, MD   alum & mag hydroxide-simeth (MAALOX/MYLANTA) 200-200-20 MG/5ML suspension 30 mL, 30 mL, Oral, Q6H PRN, Jessy Oto, MD   amLODipine (NORVASC) tablet 10 mg, 10 mg, Oral, Daily, Jessy Oto, MD, 10 mg at 12/13/21 0848   aspirin EC tablet 81 mg, 81 mg, Oral, Daily, Jessy Oto, MD, 81 mg at 12/13/21 0847   atorvastatin (LIPITOR) tablet 40 mg, 40 mg, Oral, Daily, Jessy Oto,  MD, 40 mg at 12/13/21 0848   bisacodyl (DULCOLAX) EC tablet 5 mg, 5 mg, Oral, Daily PRN, Jessy Oto, MD, 5 mg at 12/07/21 0529   docusate sodium (COLACE) capsule 100 mg, 100 mg, Oral, BID, Jessy Oto, MD, 100 mg at 12/12/21 2121   empagliflozin (JARDIANCE) tablet 25 mg, 25 mg, Oral, Q breakfast, Jessy Oto, MD, 25 mg at 12/13/21 0848   HYDROcodone-acetaminophen ([place]) 7.5-325 MG per tablet 1 tablet, 1 tablet, Oral, Q6H, Jessy Oto, MD, 1 tablet at 12/11/21 0022   HYDROcodone-acetaminophen (NORCO) 7.5-325 MG per tablet 1 tablet, 1 tablet, Oral, Q4H PRN, Jessy Oto, MD, 1 tablet at 12/11/21 2145   HYDROcodone-acetaminophen (NORCO) 7.5-325 MG per tablet 2 tablet, 2 tablet, Oral, Q4H PRN, Jessy Oto, MD, 2 tablet at 12/13/21 [phone]   HYDROmorphone (DILAUDID) injection 1 mg, 1 mg, Intravenous, Q4H PRN, Garald Balding, MD, 1 mg at 12/13/21 0850   insulin aspart (novoLOG) injection 0-15 Units, 0-15 Units, Subcutaneous, TID WC, Jessy Oto, MD, 2 Units at 12/13/21 0849   insulin aspart (novoLOG) injection 4 Units, 4 Units, Subcutaneous, TID WC, Jessy Oto, MD, 4 Units at 12/12/21 1659   loratadine (CLARITIN) tablet 10 mg, 10 mg, Oral, Daily, Jessy Oto, MD, 10 mg at 12/13/21 0849   losartan (COZAAR) tablet 25 mg, 25 mg, Oral, Daily, Jessy Oto, MD, 25 mg at 12/13/21 0847   menthol-cetylpyridinium (CEPACOL) lozenge 3 mg, 1 lozenge, Oral, PRN **OR** phenol (CHLORASEPTIC) mouth spray 1 spray, 1 spray, Mouth/Throat, PRN, Jessy Oto, MD   methocarbamol (ROBAXIN) tablet 500 mg, 500 mg, Oral, Q6H PRN, 500 mg at 12/12/21 1659 **OR** methocarbamol (ROBAXIN) 500 mg in dextrose 5 % 50 mL IVPB, 500 mg, Intravenous, Q6H PRN, Jessy Oto, MD   morphine (PF) 2 MG/ML injection 1 mg, 1 mg, Intravenous, Q2H PRN, Jessy Oto, MD, 1 mg at 12/10/21 0204   nitroGLYCERIN (NITROSTAT) SL tablet 0.4 mg, 0.4 mg, Sublingual, Q5 min PRN, Jessy Oto, MD   ondansetron (ZOFRAN) tablet  4 mg, 4 mg, Oral, Q6H PRN **OR** ondansetron (ZOFRAN) injection 4 mg, 4 mg, Intravenous, Q6H PRN, Jessy Oto, MD, 4 mg at 12/06/21 2022   Oral care mouth rinse, 15 mL, Mouth Rinse, PRN, Jessy Oto, MD   pantoprazole (PROTONIX) EC tablet 40 mg, 40 mg, Oral, QHS, Skeet Simmer, RPH, 40 mg at 12/12/21 2121   polyethylene glycol (MIRALAX / GLYCOLAX) packet 17 g, 17 g, Oral, Daily PRN, Jessy Oto, MD, 17 g at 12/07/21 0530   predniSONE (STERAPRED UNI-PAK 21 TAB) tablet 10 mg, 10 mg, Oral, 4X daily taper, Jessy Oto,  MD, 10 mg at 12/12/21 2122   pregabalin (LYRICA) capsule 75 mg, 75 mg, Oral, BID, Jessy Oto, MD, 75 mg at 12/13/21 0847   sodium chloride flush (NS) 0.9 % injection 3 mL, 3 mL, Intravenous, Q12H, Jessy Oto, MD, 3 mL at 12/12/21 2124   sodium chloride flush (NS) 0.9 % injection 3 mL, 3 mL, Intravenous, PRN, Jessy Oto, MD   sodium phosphate (FLEET) 7-19 GM/118ML enema 1 enema, 1 enema, Rectal, Once PRN, Jessy Oto, MD   traZODone (DESYREL) tablet 25-50 mg, 25-50 mg, Oral, QHS PRN, Jessy Oto, MD, 50 mg at 12/11/21 2144   Vitamin D (Ergocalciferol) (DRISDOL) capsule 50,000 Units, 50,000 Units, Oral, Weekly, Jessy Oto, MD, 50,000 Units at 12/12/21 8250   Patients Current Diet:  Diet Order                  Diet - low sodium heart healthy             Diet Carb Modified Fluid consistency: Thin; Room service appropriate? Yes  Diet effective now                       Precautions / Restrictions Precautions Precautions: Back Precaution Booklet Issued: No Precaution Comments: reviewed 3/3 back precautions Spinal Brace: Applied in sitting position, Lumbar corset Restrictions Weight Bearing Restrictions: No    Has the patient had 2 or more falls or a fall with injury in the past year? Yes   Prior Activity Level Community (5-7x/wk): drives, gets out of house 3-4 days/week   Prior Functional Level Self Care: Did the patient need help  bathing, dressing, using the toilet or eating? Needed some help   Indoor Mobility: Did the patient need assistance with walking from room to room (with or without device)? Independent   Stairs: Did the patient need assistance with internal or external stairs (with or without device)? Independent   Functional Cognition: Did the patient need help planning regular tasks such as shopping or remembering to take medications? Needed some help   Patient Information Are you of Hispanic, Latino/a,or Spanish origin?: A. No, not of Hispanic, Latino/a, or Spanish origin What is your race?: B. Black or African American Do you need or want an interpreter to communicate with a doctor or health care staff?: 0. No   Patient's Response To:  Health Literacy and Transportation Is the patient able to respond to health literacy and transportation needs?: Yes Health Literacy - How often do you need to have someone help you when you read instructions, pamphlets, or other written material from your doctor or pharmacy?: Never (can read the pamphlets but doesn't always understand it or remember the information) In the past 12 months, has lack of transportation kept you from medical appointments or from getting medications?: Yes (girlfriend provides transportation at times. when she's not available he has missed appointments) In the past 12 months, has lack of transportation kept you from meetings, work, or from getting things needed for daily living?: Yes (girlfriend provides transportation. when she's not available, he has missed appointements)   [place] Devices/Equipment: None Home Equipment: Cane - single point   Prior Device Use: Indicate devices/aids used by the patient prior to current illness, exacerbation or injury?  cane   Current Functional Level Cognition   Overall Cognitive Status: Within Functional Limits for tasks assessed Orientation Level: Oriented  X4 General Comments: motivated to progress this  session, less fixation on pain when chatting about hobbies pt laughing and conversative    Extremity Assessment (includes Sensation/Coordination)   Upper Extremity Assessment: Overall WFL for tasks assessed  Lower Extremity Assessment: Defer to PT evaluation     ADLs   Overall ADL's : Needs assistance/impaired Eating/Feeding: Modified independent, Bed level Grooming: Modified independent, Sitting, Oral care, Wash/dry face, Wash/dry hands Grooming Details (indicate cue type and reason): preferred to perform task sitting at sink rather than standing d/t pain. discussed home modiifications/setup to allow energy conservation at home as well Upper Body Bathing: Minimal assistance, Sitting Lower Body Bathing: Total assistance, Adhering to back precautions, Sit to/from stand Upper Body Dressing : Minimal assistance, Sitting Upper Body Dressing Details (indicate cue type and reason): to don brace Lower Body Dressing: Moderate assistance, Sit to/from stand, Cueing for back precautions Lower Body Dressing Details (indicate cue type and reason): reviewed figure four position for LB dressing with pt typically pulling on compression stockings to lift LE. able to use socks to pull LE up 75% of the way though painful. may benefit from AE education Toilet Transfer: Min guard, Ambulation, Rolling walker (2 wheels) Toilet Transfer Details (indicate cue type and reason): simulated, very slow labored gait d/t pain-ambulated into b.room but did not need to use it-seated at sink for grooming tasks Toileting- Clothing Manipulation and Hygiene: Total assistance, Adhering to back precautions, Sit to/from stand Tub/Shower Transfer Details (indicate cue type and reason): pt reports tub shower at home, brief education on need for TTB after rehab Functional mobility during ADLs: Min guard, Rolling walker (2 wheels) General ADL Comments: pt able to don LSO brace with setup  sitting EOB. Emphasis on back precautions, activity pacing (d/t pain), and gradual progression of strategies for balance     Mobility   Overal bed mobility: Needs Assistance Bed Mobility: Sit to Sidelying, Rolling Rolling: Modified independent (Device/Increase time) Sidelying to sit: Supervision Supine to sit: HOB elevated, Min assist Sit to sidelying: Min assist General bed mobility comments: Assist for helping LEs into bed; good log roll form     Transfers   Overall transfer level: Needs assistance Equipment used: Rolling walker (2 wheels) Transfers: Sit to/from Stand Sit to Stand: Min guard Bed to/from chair/wheelchair/BSC transfer type:: Step pivot Step pivot transfers: Min assist, From elevated surface General transfer comment: increased time to stand with RW from recliner with good hand placement. minor cues for keeping spine straight     Ambulation / Gait / Stairs / Wheelchair Mobility   Ambulation/Gait Ambulation/Gait assistance: Min guard (with and without physical contact) Gait Distance (Feet): 35 Feet Assistive device: Rolling walker (2 wheels) Gait Pattern/deviations: Decreased stride length, Step-to pattern, Decreased weight shift to left General Gait Details: Noting slightly better LLE advancement with ace wrap for doriflexion assist; pt seemed to like teh dorsiflexion assist Gait velocity: very slow Gait velocity interpretation: <1.31 ft/sec, indicative of household ambulator     Posture / Balance Dynamic Sitting Balance Sitting balance - Comments: able to sit EOB to don brace with no LOB Balance Overall balance assessment: Needs assistance Sitting-balance support: Feet supported, No upper extremity supported Sitting balance-Leahy Scale: Fair Sitting balance - Comments: able to sit EOB to don brace with no LOB Standing balance support: Bilateral upper extremity supported, During functional activity Standing balance-Leahy Scale: Poor (approaching Fair) Standing  balance comment: reliant on at least one UE support in standing     Special needs/care consideration      Previous Home Environment  Living Arrangements: Alone Available Help at Discharge: Available PRN/intermittently (girlfriend) Type of Home: Other(Comment) (condo) Home Layout: 1/2 bath on main level Home Access: Stairs to enter (20-25 steps to get to bed/bath upstairs) Entrance Stairs-Rails: None Entrance Stairs-Number of Steps: 5 Bathroom Shower/Tub: Chiropodist: Standard Bathroom Accessibility: Yes (Upstairs bathroom is walker accessible. 1/2 bath is not) How Accessible: Accessible via walker Home Care Services: No   Discharge Living Setting Plans for Discharge Living Setting: Patient's home Type of Home at Discharge: Other (Comment) (condo) Discharge Home Layout: 1/2 bath on main level Discharge Home Access: Stairs to enter Entrance Stairs-Rails: None Entrance Stairs-Number of Steps: 5 Discharge Bathroom Shower/Tub: Tub/shower unit Discharge Bathroom Toilet: Standard Discharge Bathroom Accessibility: Yes How Accessible: Accessible via walker Does the patient have any problems obtaining your medications?: No   Social/Family/Support Systems Anticipated Caregiver: Rodolph Bong, girlfriend Anticipated Caregiver's Contact Information: [phone] Caregiver Availability: Intermittent Discharge Plan Discussed with Primary Caregiver: Yes Is Caregiver In Agreement with Plan?: Yes Does Caregiver/Family have Issues with Lodging/Transportation while Pt is in Rehab?: No   Goals Patient/Family Goal for Rehab: Supervision: PT/OT Expected length of stay: 7-10 days Pt/Family Agrees to Admission and willing to participate: Yes Program Orientation Provided & Reviewed with Pt/Caregiver Including Roles  & Responsibilities: Yes   Decrease burden of Care through IP rehab admission: NA   Possible need for SNF placement upon discharge: Not anticipated   Patient  Condition: I have reviewed medical records from [facility], spoken with CM, and patient, girlfriend. I met with patient at the bedside for inpatient rehabilitation assessment.  Patient will benefit from ongoing PT and OT, can actively participate in 3 hours of therapy a day 5 days of the week, and can make measurable gains during the admission.  Patient will also benefit from the coordinated team approach during an Inpatient Acute Rehabilitation admission.  The patient will receive intensive therapy as well as Rehabilitation physician, nursing, social worker, and care management interventions.  Due to safety, skin/wound care, disease management, medication administration, pain management, and patient education the patient requires 24 hour a day rehabilitation nursing.  The patient is currently min assist overall with mobility and basic ADLs.  Discharge setting and therapy post discharge at home with home health is anticipated.  Patient has agreed to participate in the Acute Inpatient Rehabilitation Program and will admit today.   Preadmission Screen Completed By:  Danne Baxter RN MSN, 12/13/2021 10:22 AM ______________________________________________________________________   Discussed status with Dr. Dagoberto Ligas on 12/13/21 at 1021 and received approval for admission today.   Admission Coordinator:  Danne Baxter RN MSN, time 1021 Date 12/13/2021    Assessment/Plan: Diagnosis: Does the need for close, 24 hr/day Medical supervision in concert with the patient's rehab needs make it unreasonable for this patient to be served in a less intensive setting? Yes Co-Morbidities requiring supervision/potential complications: DM2, neuromuscular d/o; CAD; anxiety; lumbar stenosis s/p multi level fusion; nerve pain Due to bladder management, bowel management, safety, skin/wound care, disease management, medication administration, pain management, and patient education, does the patient require 24 hr/day rehab  nursing? Yes Does the patient require coordinated care of a physician, rehab nurse, PT, OT, and SLP to address physical and functional deficits in the context of the above medical diagnosis(es)? Yes Addressing deficits in the following areas: balance, endurance, locomotion, strength, transferring, bowel/bladder control, bathing, dressing, feeding, grooming, and toileting Can the patient actively participate in an intensive therapy program of at least 3 hrs of therapy 5  days a week? Yes The potential for patient to make measurable gains while on inpatient rehab is good Anticipated functional outcomes upon discharge from inpatient rehab: supervision PT, supervision OT, n/a SLP Estimated rehab length of stay to reach the above functional goals is: 7-10 days Anticipated discharge destination: Home 10. Overall Rehab/Functional Prognosis: good     MD Signature:           Revision History                                               Note Details  Jan Fireman, MD File Time 12/13/2021 10:35 AM  Author Type Physician Status Signed  Last Editor Courtney Heys, MD Service Physical Medicine and [place] # [id number] Admit Date 12/13/2021

## 2021-12-13 NOTE — TOC Transition Note (Signed)
Transition of Care [facility]) - CM/SW Discharge Note   Patient Details  Name: Scott Tucker MRN: [id number] Date of Birth: March 27, 1966  [facility] CM/SW Contact:  Bess Kinds, RN Phone Number: [phone] 12/13/2021, 8:02 AM   Clinical Narrative:     Patient to transition to CIR today. No TOC needs identified at this time.  Final next level of care: IP Rehab Facility Barriers to Discharge: No Barriers Identified   Patient Goals and CMS Choice     Choice offered to / list presented to : Patient  Discharge Placement                       Discharge Plan and Services   Discharge Planning Services: CM Consult                                 Social Determinants of Health (SDOH) Interventions     Readmission Risk Interventions     No data to display

## 2021-12-14 ENCOUNTER — Encounter: Payer: Self-pay | Admitting: Specialist

## 2021-12-14 DIAGNOSIS — M48062 Spinal stenosis, lumbar region with neurogenic claudication: Secondary | ICD-10-CM | POA: Diagnosis not present

## 2021-12-14 DIAGNOSIS — D72829 Elevated white blood cell count, unspecified: Secondary | ICD-10-CM

## 2021-12-14 DIAGNOSIS — E876 Hypokalemia: Secondary | ICD-10-CM | POA: Diagnosis not present

## 2021-12-14 DIAGNOSIS — E119 Type 2 diabetes mellitus without complications: Secondary | ICD-10-CM

## 2021-12-14 DIAGNOSIS — M792 Neuralgia and neuritis, unspecified: Secondary | ICD-10-CM

## 2021-12-14 LAB — GLUCOSE, CAPILLARY
Glucose-Capillary: 95 mg/dL (ref 70–99)
Glucose-Capillary: 125 mg/dL — ABNORMAL HIGH (ref 70–99)
Glucose-Capillary: 135 mg/dL — ABNORMAL HIGH (ref 70–99)
Glucose-Capillary: 138 mg/dL — ABNORMAL HIGH (ref 70–99)

## 2021-12-14 LAB — CBC WITH DIFFERENTIAL/PLATELET
Abs Immature Granulocytes: 0 10*3/uL (ref 0.00–0.07)
Basophils Absolute: 0.4 10*3/uL — ABNORMAL HIGH (ref 0.0–0.1)
Basophils Relative: 2 %
Eosinophils Absolute: 0 10*3/uL (ref 0.0–0.5)
Eosinophils Relative: 0 %
HCT: 37.2 % — ABNORMAL LOW (ref 39.0–52.0)
Hemoglobin: 13.1 g/dL (ref 13.0–17.0)
Lymphocytes Relative: 32 %
Lymphs Abs: 5.7 10*3/uL — ABNORMAL HIGH (ref 0.7–4.0)
MCH: 31.9 pg (ref 26.0–34.0)
MCHC: 35.2 g/dL (ref 30.0–36.0)
MCV: 90.5 fL (ref 80.0–100.0)
Monocytes Absolute: 1.6 10*3/uL — ABNORMAL HIGH (ref 0.1–1.0)
Monocytes Relative: 9 %
Neutro Abs: 10.1 10*3/uL — ABNORMAL HIGH (ref 1.7–7.7)
Neutrophils Relative %: 57 %
Platelets: 531 10*3/uL — ABNORMAL HIGH (ref 150–400)
RBC: 4.11 MIL/uL — ABNORMAL LOW (ref 4.22–5.81)
RDW: 12.6 % (ref 11.5–15.5)
WBC: 17.8 10*3/uL — ABNORMAL HIGH (ref 4.0–10.5)
nRBC: 0 % (ref 0.0–0.2)
nRBC: 0 /100 WBC

## 2021-12-14 LAB — COMPREHENSIVE METABOLIC PANEL
ALT: 15 U/L (ref 0–44)
AST: 13 U/L — ABNORMAL LOW (ref 15–41)
Albumin: 4 g/dL (ref 3.5–5.0)
Alkaline Phosphatase: 92 U/L (ref 38–126)
Anion gap: 7 (ref 5–15)
BUN: 14 mg/dL (ref 6–20)
CO2: 25 mmol/L (ref 22–32)
Calcium: 9 mg/dL (ref 8.9–10.3)
Chloride: 106 mmol/L (ref 98–111)
Creatinine, Ser: 0.73 mg/dL (ref 0.61–1.24)
GFR, Estimated: >60 mL/min (ref >60)
Glucose, Bld: 123 mg/dL — ABNORMAL HIGH (ref 70–99)
Potassium: 3.3 mmol/L — ABNORMAL LOW (ref 3.5–5.1)
Sodium: 138 mmol/L (ref 135–145)
Total Bilirubin: 0.5 mg/dL (ref 0.3–1.2)
Total Protein: 7 g/dL (ref 6.5–8.1)

## 2021-12-14 MED ORDER — POTASSIUM CHLORIDE CRYS ER 20 MEQ PO TBCR
20.0000 meq | EXTENDED_RELEASE_TABLET | Freq: Two times a day (BID) | ORAL | Status: AC
  Administered 2021-12-14 (×2): 20 meq via ORAL
  Filled 2021-12-14 (×2): qty 1

## 2021-12-14 NOTE — Progress Notes (Signed)
Inpatient Rehabilitation Center Individual Statement of Services  Patient Name:  GEOVANY TRUDO  Date:  12/14/2021  Welcome to the Inpatient Rehabilitation Center.  Our goal is to provide you with an individualized program based on your diagnosis and situation, designed to meet your specific needs.  With this comprehensive rehabilitation program, you will be expected to participate in at least 3 hours of rehabilitation therapies Monday-Friday, with modified therapy programming on the weekends.  Your rehabilitation program will include the following services:  Physical Therapy (PT), Occupational Therapy (OT), 24 hour per day rehabilitation nursing, Therapeutic Recreaction (TR), Neuropsychology, Care Coordinator, Rehabilitation Medicine, Nutrition Services, and Pharmacy Services  Weekly team conferences will be held on Wednesday to discuss your progress.  Your Inpatient Rehabilitation Care Coordinator will talk with you frequently to get your input and to update you on team discussions.  Team conferences with you and your family in attendance may also be held.  Expected length of stay: 7-10 days  Overall anticipated outcome: independent with device  Depending on your progress and recovery, your program may change. Your Inpatient Rehabilitation Care Coordinator will coordinate services and will keep you informed of any changes. Your Inpatient Rehabilitation Care Coordinator's name and contact numbers are listed  below.  The following services may also be recommended but are not provided by the Inpatient Rehabilitation Center:  Driving Evaluations Home Health Rehabiltiation Services Outpatient Rehabilitation Services    Arrangements will be made to provide these services after discharge if needed.  Arrangements include referral to agencies that provide these services.  Your insurance has been verified to be:  [employer] and Medicaid Your primary doctor is:  Gwinda Passe  Pertinent  information will be shared with your doctor and your insurance company.  Inpatient Rehabilitation Care Coordinator:  Dossie Der, [place] [phone] or Luna Glasgow  Information discussed with and copy given to patient by: [name], 12/14/2021, 10:19 AM

## 2021-12-14 NOTE — Progress Notes (Signed)
Elevated WBC on CBC On Steroid dose Pak Slight elevation of BG CRP is 0.8 This suggests that steriod likely is cause of leukocytosis. Infection and inflamation less likely due to low normal CRP.  Legs NV intact complaints of left thigh pain L4 neuropathic discomfort Admitted to Rehab yesterday 8/3 Seen in Rehab today in good spirits.

## 2021-12-14 NOTE — Progress Notes (Signed)
Inpatient Rehabilitation Care Coordinator Assessment and Plan Patient Details  Name: Scott Tucker MRN: [id number] Date of Birth: 05-07-1966  Today's Date: 12/14/2021  Hospital Problems: Principal Problem:   Lumbar stenosis  Past Medical History:  Past Medical History:  Diagnosis Date   Anginal pain (HCC)    Anxiety    Arthritis    Chronic back pain    Coronary artery disease    Diabetes mellitus without complication (HCC)    type 2   Dyspnea    Headache    HTN (hypertension)    Neuromuscular disorder (HCC)    Past Surgical History:  Past Surgical History:  Procedure Laterality Date   ANTERIOR CERVICAL DECOMP/DISCECTOMY FUSION N/A 12/04/2020   Procedure: ANTERIOR CERVICAL DISCECTOMY FUSION C3-4, C4-5, ALLOGRAFT, PLATE;  Surgeon: Eldred Manges, MD;  Location: [facility];  Service: Orthopedics;  Laterality: N/A;  needs RNFA   BACK SURGERY     1992 and 2003(dr kritzer)   CARDIAC CATHETERIZATION     SHOULDER SURGERY Right    2017   TONSILLECTOMY     removed as a child   Social History:  reports that he has never smoked. He has never used smokeless tobacco. He reports current alcohol use of about 3.0 standard drinks of alcohol per week. He reports current drug use. Drug: Marijuana.  Family / Support Systems Marital Status: Single Patient Roles: Partner, Parent Spouse/Significant Other: [name]-girlfriend  [phone] Children: Son and daughter who are local Other Supports: Friends Anticipated Caregiver: [name] Ability/Limitations of Caregiver: Rodell Perna can only do intermittent assist works Engineer, structural Availability: Intermittent Family Dynamics: Close with children and Rodell Perna all will be checking in on him and making sure he has what he needs. They can not provide 24/7 care due to thier jobs  Social History Preferred language: English Religion: Christian Cultural Background: no issues Education: HS Health Literacy - How often do you need to have someone help you when you read  instructions, pamphlets, or other written material from your doctor or pharmacy?: Never Writes: Yes Employment Status: Disabled Marine scientist Issues: No issues Guardian/Conservator: None-according to MD pt is capable of making his own decisions while here. Although pt reprots memory issues since past two surgeries   Abuse/Neglect Abuse/Neglect Assessment Can Be Completed: Yes Physical Abuse: Denies Verbal Abuse: Denies Sexual Abuse: Denies Exploitation of patient/patient's resources: Denies Self-Neglect: Denies  Patient response to: Social Isolation - How often do you feel lonely or isolated from those around you?: Never  Emotional Status Pt's affect, behavior and adjustment status: Pt is motivated to do well but is having major pain he feels if thjis can be amanged he would do better. He has always been able to recover after his other surgeries and is hopeful he will again it just may take a little time since this is his fourth surgery Recent Psychosocial Issues: other health issues and limitations from prior surgeries-pain issues Psychiatric History: History of anxiety takes medications for which he finds helpful. May benefit from seeing neuro-psych while here Substance Abuse History: No issues  Patient / Family Perceptions, Expectations & Goals Pt/Family understanding of illness & functional limitations: Pt can explain his surgery he has had four now and hopes this is the last. He does talk with the MD and feels he understands his treatment plan moving forward. He does need reinforcement the team can give him rest breaks if too close together Premorbid pt/family roles/activities: Father, partner, friend, etc Anticipated changes in roles/activities/participation: resume Pt/family expectations/goals: Pt states: "  I hope to recover and be able to take care of myself when I leave here."  [employer]: None Premorbid Home Care/DME Agencies: Other  (Comment) (cane) Transportation available at discharge: Self may need to rely upon patrice and chidlren until he can drive again Is the patient able to respond to transportation needs?: Yes In the past 12 months, has lack of transportation kept you from medical appointments or from getting medications?: Yes In the past 12 months, has lack of transportation kept you from meetings, work, or from getting things needed for daily living?: Yes Resource referrals recommended: Neuropsychology  Discharge Planning Living Arrangements: Alone Support Systems: Spouse/significant other, Children, Friends/neighbors Type of Residence: Private residence Insurance Resources: [employer] (specify county), Media planner (specify) Theatre manager Medicare) Financial Resources: [employer] Financial Screen Referred: No Living Expenses: Rent Money Management: Patient Does the patient have any problems obtaining your medications?: No Home Management: self and Naval architect Preliminary Plans: Return home with intermittent assist from [place] since she works and doesn;t live with him. Hopefully can be safe and reach mod/i level prior to DC. Will await therapy evaluations and work on safe plan for him Care Coordinator Barriers to Discharge: Decreased caregiver support, Insurance for SNF coverage Care Coordinator Anticipated Follow Up Needs: HH/OP  Clinical Impression Pleasant gentleman who is obviously in pain but pushing through. He will need to reach mod/I level to go home alone safely since he will only have intermittent assist at discharge from [place]. Will await therapy evaluations and work on discharge needs. Will ask if needs speech evaluations due to self report of memory issues since past two surgereis  Melodye Swor, [name] 12/14/2021, 10:17 AM

## 2021-12-14 NOTE — Progress Notes (Signed)
Occupational Therapy Assessment and Plan  Patient Details  Name: Scott Tucker MRN: [id number] Date of Birth: 09/22/1965  OT Diagnosis: acute pain, lumbago (low back pain), muscle weakness (generalized), pain in thoracic spine Rehab Potential: Rehab Potential (ACUTE ONLY): Good ELOS: 7-10 days   Today's Date: 12/14/2021 OT Individual Time: 0847-1000 OT Individual Time Calculation (min): 72 min     Hospital Problem: Principal Problem:   Lumbar stenosis   Past Medical History:  Past Medical History:  Diagnosis Date   Anginal pain ([place])    Anxiety    Arthritis    Chronic back pain    Coronary artery disease    Diabetes mellitus without complication ([place])    type 2   Dyspnea    Headache    HTN (hypertension)    Neuromuscular disorder ([place])    Past Surgical History:  Past Surgical History:  Procedure Laterality Date   ANTERIOR CERVICAL DECOMP/DISCECTOMY FUSION N/A 12/04/2020   Procedure: ANTERIOR CERVICAL DISCECTOMY FUSION C3-4, C4-5, ALLOGRAFT, PLATE;  Surgeon: Marybelle Killings, MD;  Location: [facility];  Service: Orthopedics;  Laterality: N/A;  needs [place] and 2003(dr kritzer)   CARDIAC CATHETERIZATION     SHOULDER SURGERY Right    2017   TONSILLECTOMY     removed as a child    Assessment & Plan Clinical Impression: Scott Tucker is a 56 year old male with history of CAD, T2DM, h/o Covid 19,  ACDF, lumbar spondylolisthesis w/stenosis and chronic LBP X 1 year who was admitted on 12/04/21 for transforaminal fusion L3/4 and L4/5 with removal of hardware L5-S1 and instrumentation L3-S! With rods, screws and cages by Dr. Louanne Skye. Post op limited by pain, constipation w/abdominal distension, rise in WBC to 16.1 and continues to fluctuate as well as weakness with LLE with foot drop. Steroid dose pack added 07/30 and CT lumbar spine done 07/31 revealing stable hardware with improvement in thecal sac and left foraminal patency, incidental finding of right renal  nephrolithiasis and no acute findings.  Lyrica titrated up for mangement of left thigh neuropathic pain. WBC continues to fluctuate but remains afebrile and ABLA stable. Therapy has been working with patient who continues to be limited by pain and weakness LLE. CIR recommended due to functional decline. Patient transferred to CIR on 12/13/2021 .    Patient currently requires mod with basic self-care skills secondary to muscle weakness, decreased cardiorespiratoy endurance, and decreased standing balance, decreased balance strategies, and difficulty maintaining precautions.  Prior to hospitalization, patient could complete ADLs with modified independent .  Patient will benefit from skilled intervention to increase independence with basic self-care skills prior to discharge home independently.  Anticipate patient will require intermittent supervision and follow up outpatient.  OT - End of Session Activity Tolerance: Tolerates 30+ min activity with multiple rests Endurance Deficit: Yes Endurance Deficit Description: limited by pain OT Assessment Rehab Potential (ACUTE ONLY): Good OT Barriers to Discharge: Inaccessible home environment;Decreased caregiver support;Home environment access/layout OT Barriers to Discharge Comments: stairs within the home and living alone OT Patient demonstrates impairments in the following area(s): Balance;Safety;Sensory;Endurance;Motor;Pain OT Basic ADL's Functional Problem(s): Bathing;Dressing;Toileting OT Advanced ADL's Functional Problem(s): Simple Meal Preparation;Laundry OT Transfers Functional Problem(s): Toilet;Tub/Shower OT Additional Impairment(s): None OT Plan OT Intensity: Minimum of 1-2 x/day, 45 to 90 minutes OT Frequency: 5 out of 7 days OT Duration/Estimated Length of Stay: 7-10 days OT Treatment/Interventions: Balance/vestibular training;Discharge planning;Pain management;Self Care/advanced ADL retraining;Therapeutic Activities;UE/LE Coordination  activities;Functional  mobility training;Patient/family education;Therapeutic Exercise;Community reintegration;DME/adaptive equipment instruction;Psychosocial support;UE/LE Strength taining/ROM;Wheelchair propulsion/positioning OT Self Feeding Anticipated Outcome(s): Independent OT Basic Self-Care Anticipated Outcome(s): Mod I OT Toileting Anticipated Outcome(s): Mod I OT Bathroom Transfers Anticipated Outcome(s): Mod I OT Recommendation Patient destination: Home Equipment Recommended: To be determined  OT Evaluation Precautions/Restrictions  Precautions Precautions: Back Precaution Comments: reviewed 3/3 back precautions (no bending, lifting, twisting) Required Braces or Orthoses: Spinal Brace Spinal Brace: Applied in sitting position;Lumbar corset Restrictions Weight Bearing Restrictions: No General Chart Reviewed: Yes Additional Pertinent History: CAD, T2DM, h/o Covid 19,  ACDF, lumbar spondylolisthesis w/stenosis and chronic LBP X 1 year who was admitted on 12/04/21 for transforaminal fusion L3/4 and L4/5 with removal of hardware L5-S1 and instrumentation L3-S Response to Previous Treatment: Patient with no complaints from previous session Family/Caregiver Present: No Pain Pain Assessment Pain Scale: 0-10 Pain Score: 9  Faces Pain Scale: Hurts whole lot Pain Type: Acute pain;Chronic pain;Surgical pain Pain Location: Leg Pain Orientation: Proximal;Distal;Left Pain Descriptors / Indicators: Burning;Constant;Throbbing;Radiating;Shooting;Aching Pain Onset: On-going Pain Intervention(s): Emotional support;Rest;Distraction Multiple Pain Sites: No Home Living/Prior Functioning Home Living Family/patient expects to be discharged to:: Private residence Living Arrangements: Alone Available Help at Discharge: Available PRN/intermittently Type of Home: Other(Comment) (lives in a condo) Home Access: Stairs to enter [employer] of Steps: 5-7 steps to enter the house (no  rails), 20-25- steps inside (ralinging on R side) Entrance Stairs-Rails: None Home Layout: 1/2 bath on main level Bathroom Shower/Tub: Tub/shower unit Bathroom Accessibility: Yes (upstairs bathroom will fit w/c and walker- reports pt) Additional Comments: 1/2 bath downstairs will not fit walker or w/c  Lives With: Alone IADL History Homemaking Responsibilities: Yes Meal Prep Responsibility: Primary Laundry Responsibility: Primary Cleaning Responsibility: Primary Bill Paying/Finance Responsibility: Primary Shopping Responsibility: Primary Child Care Responsibility: Primary Current License: Yes Mode of Transportation: Car Occupation: On disability 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 Leisure and Hobbies: fishing, watching tv Prior Function Level of Independence: Requires assistive device for independence, Independent with homemaking with ambulation, Independent with transfers (used a SPC to complete functional mobility)  Able to Take Stairs?: Yes Driving: Yes ("when i felt like it") Vocation: On disability Leisure: Hobbies-yes (Comment) Vision Baseline Vision/History: 0 No visual deficits Ability to See in Adequate Light: 0 Adequate Patient Visual Report: No change from baseline;Other (comment) (reports blurriness/difficulty reading signs when driving and overall (this is not new)) Vision Assessment?: Vision impaired- to be further tested in functional context Perception  Perception: Within Functional Limits Praxis Praxis: Intact Cognition Cognition Overall Cognitive Status: Within Functional Limits for tasks assessed Arousal/Alertness: Awake/alert Orientation Level: Person;Place;Situation Person: Oriented Place: Oriented Situation: Oriented Memory: Impaired Attention: Selective Selective Attention: Impaired Selective Attention Impairment: Verbal complex;Functional complex Awareness: Appears intact Problem Solving: Appears intact Safety/Judgment: Appears  intact Comments: Self-reports trouble with memory but nothing new Brief Interview for Mental Status (BIMS) Repetition of Three Words (First Attempt): 3 Temporal Orientation: Year: Correct Temporal Orientation: Month: Accurate within 5 days Temporal Orientation: Day: Correct Recall: "Sock": Yes, no cue required Recall: "Blue": Yes, no cue required Recall: "Bed": Yes, no cue required BIMS Summary Score: 15 Sensation Sensation Light Touch: Appears Intact Hot/Cold: Appears Intact Proprioception: Appears Intact Coordination Gross Motor Movements are Fluid and Coordinated: No Fine Motor Movements are Fluid and Coordinated: Yes Coordination and Movement Description: overall impaired due to back pain- slower movements overall Finger Nose Finger Test: intact Motor  Motor Motor - Skilled Clinical Observations: much slower and limited due to back pain and precautions  Trunk/Postural Assessment  Cervical Assessment Cervical Assessment: Within Functional Limits Thoracic Assessment Thoracic Assessment: Exceptions to [facility] Lumbar Assessment Lumbar Assessment: Exceptions to [facility] Postural Control Postural Control: Deficits on evaluation Postural Limitations: limited d/t chronic and acute back pain  Balance Balance Balance Assessed: Yes Static Sitting Balance Static Sitting - Balance Support: Feet supported Static Sitting - Level of Assistance: 5: Stand by assistance Dynamic Sitting Balance Dynamic Sitting - Balance Support: Feet supported Dynamic Sitting - Level of Assistance: 4: Min assist Dynamic Sitting - Balance Activities: Reaching for objects Sitting balance - Comments: able to don shirt and brace with no LOB Static Standing Balance Static Standing - Balance Support: Bilateral upper extremity supported Static Standing - Level of Assistance: 4: Min assist Dynamic Standing Balance Dynamic Standing - Balance Support: Right upper extremity supported Dynamic Standing - Level of  Assistance: 3: Mod assist Dynamic Standing - Balance Activities: Reaching for objects;Reaching for weighted objects Dynamic Standing - Comments: Able to stand and use urinal and manipulate LB clothing with some impaired balance but no loss of balance Extremity/Trunk Assessment RUE Assessment RUE Assessment: Within Functional Limits Active Range of Motion (AROM) Comments: WFL General Strength Comments: 4/5 MMT, grip intact LUE Assessment LUE Assessment: Within Functional Limits Active Range of Motion (AROM) Comments: WFL General Strength Comments: 4/5 MMT, grip intact  Care Tool Care Tool Self Care Eating   Eating Assist Level: Supervision/Verbal cueing    Oral Care    Oral Care Assist Level: Supervision/Verbal cueing    Bathing   Body parts bathed by patient: Left arm;Right arm;Chest;Abdomen;Front perineal area;Face Body parts bathed by helper: Left upper leg;Right lower leg;Left lower leg;Right upper leg;Buttocks   Assist Level: Maximal Assistance - Patient 24 - 49%    Upper Body Dressing(including orthotics)   What is the patient wearing?: Pull over shirt   Assist Level: Contact Guard/Touching assist    Lower Body Dressing (excluding footwear)   What is the patient wearing?: Pants Assist for lower body dressing: Moderate Assistance - Patient 50 - 74%    Putting on/Taking off footwear   What is the patient wearing?: Non-skid slipper socks Assist for footwear: Moderate Assistance - Patient 50 - 74%       Care Tool Toileting Toileting activity   Assist for toileting: Moderate Assistance - Patient 50 - 74%     Care Tool Bed Mobility Roll left and right activity   Roll left and right assist level: Contact Guard/Touching assist    Sit to lying activity   Sit to lying assist level: Contact Guard/Touching assist    Lying to sitting on side of bed activity   Lying to sitting on side of bed assist level: the ability to move from lying on the back to sitting on the side  of the bed with no back support.: Contact Guard/Touching assist     Care Tool Transfers Sit to stand transfer   Sit to stand assist level: Minimal Assistance - Patient > 75%    Chair/bed transfer   Chair/bed transfer assist level: Minimal Assistance - Patient > 75%     Toilet transfer   Assist Level: Minimal Assistance - Patient > 75%     Care Tool Cognition  Expression of Ideas and Wants Expression of Ideas and Wants: 4. Without difficulty (complex and basic) - expresses complex messages without difficulty and with speech that is clear and easy to understand  Understanding Verbal and Non-Verbal Content Understanding Verbal and Non-Verbal Content: 4. Understands (complex and basic) - clear comprehension  without cues or repetitions   Memory/Recall Ability Memory/Recall Ability : Current season;Location of own room;That he or she is in a hospital/hospital unit   Refer to Care Plan for [place] 1 OT Short Term Goal 1 (Week 1): Pt will complete functional mobility for 70 ft. with LRAD with Min A for increased independence in ADLs around the house. OT Short Term Goal 2 (Week 1): Pt will complete LB with Min A using AE as needed. OT Short Term Goal 3 (Week 1): Pt will maintain dynamic standing balance with Min A while engaging in functional activity. OT Short Term Goal 4 (Week 1): Pt will recall 3/3 back precautions with min cueing for safety in ADLs.  Recommendations for other services: None    Skilled Therapeutic Intervention Session 1: Skilled OT evaluation completed with the creation of pt centered OT POC. Pt educated on condition, ELOS, rehab expectations, and fall risk reduction strategies throughout session. All ADLs completed at the level below. Pt received in recliner reporting he just used the bathroom and washed up at the sink. Pt able to dress sitting in the recliner at the levels listed. Completed functional mobility within the room using RW at [facility]  A level. Transported to rehab gym and practiced sit > side-lying > supine to simulated bed mobility. Pt able to do at overall Min A level. Pt limited by pain but very willing to participate in therapy. Pt left seated in w/c with alarm on, call bell in reach, and all needs met.  ADL ADL Eating: Supervision/safety Grooming: Supervision/safety Upper Body Bathing: Minimal assistance Lower Body Bathing: Maximal assistance Upper Body Dressing: Contact guard Where Assessed-Upper Body Dressing: Chair Lower Body Dressing: Moderate assistance Where Assessed-Lower Body Dressing: Chair Toileting: Not assessed Toilet Transfer: Not assessed Tub/Shower Transfer: Not assessed Walk-In Shower Transfer: Not assessed Mobility  Bed Mobility Bed Mobility: Sit to Supine;Sit to Sidelying Right;Rolling Left;Right Sidelying to Sit Rolling Left: Contact Guard/Touching assist Right Sidelying to Sit: Contact Guard/Touching assist Supine to Sit: Minimal Assistance - Patient > 75% Sit to Supine: Minimal Assistance - Patient > 75% Sit to Sidelying Right: Contact Guard/Touching assist Transfers Sit to Stand: Minimal Assistance - Patient > 75% Stand to Sit: Minimal Assistance - Patient > 75%   Discharge Criteria: Patient will be discharged from OT if patient refuses treatment 3 consecutive times without medical reason, if treatment goals not met, if there is a change in medical status, if patient makes no progress towards goals or if patient is discharged from hospital.  The above assessment, treatment plan, treatment alternatives and goals were discussed and mutually agreed upon: by patient  Catalina Lunger 12/14/2021, 12:31 PM

## 2021-12-14 NOTE — Plan of Care (Cosign Needed)
  Problem: RH Balance Goal: LTG Patient will maintain dynamic standing with ADLs (OT) Description: LTG:  Patient will maintain dynamic standing balance with assist during activities of daily living (OT)  Flowsheets (Taken 12/14/2021 1234) LTG: Pt will maintain dynamic standing balance during ADLs with: Independent with assistive device   Problem: Sit to Stand Goal: LTG:  Patient will perform sit to stand in prep for activites of daily living with assistance level (OT) Description: LTG:  Patient will perform sit to stand in prep for activites of daily living with assistance level (OT) Flowsheets (Taken 12/14/2021 1234) LTG: PT will perform sit to stand in prep for activites of daily living with assistance level: Independent with assistive device   Problem: RH Dressing Goal: LTG Patient will perform lower body dressing w/assist (OT) Description: LTG: Patient will perform lower body dressing with assist, with/without cues in positioning using equipment (OT) Flowsheets (Taken 12/14/2021 1234) LTG: Pt will perform lower body dressing with assistance level of: Independent with assistive device   Problem: RH Toileting Goal: LTG Patient will perform toileting task (3/3 steps) with assistance level (OT) Description: LTG: Patient will perform toileting task (3/3 steps) with assistance level (OT)  Flowsheets (Taken 12/14/2021 1234) LTG: Pt will perform toileting task (3/3 steps) with assistance level: Independent with assistive device   Problem: RH Tub/Shower Transfers Goal: LTG Patient will perform tub/shower transfers w/assist (OT) Description: LTG: Patient will perform tub/shower transfers with assist, with/without cues using equipment (OT) Flowsheets (Taken 12/14/2021 1234) LTG: Pt will perform tub/shower stall transfers with assistance level of: Independent with assistive device LTG: Pt will perform tub/shower transfers from: Tub/shower combination

## 2021-12-14 NOTE — Discharge Summary (Signed)
Physician Discharge Summary  Patient ID: [name] MRN: [id number] DOB/AGE: [birth year] 56 y.o.  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 Cardiac and mediastinal contours are within normal limits. No focal pulmonary opacity. No pleural effusion or pneumothorax. No acute osseous abnormality.   IMPRESSION: No acute cardiopulmonary process.     Electronically Signed   By: Wiliam Ke M.D.   On: 12/13/2021 12:52     Narrative & Impression  CLINICAL DATA:  Lumbar fusion   EXAM: LUMBAR SPINE - 2-3 VIEW   COMPARISON:  12/07/2021   FINDINGS: Bilateral pedicle screw and interbody fusion L3-4 and L4-5. Hardware in satisfactory position and unchanged from the prior study. No fracture or complication.   Mild anterolisthesis L4-5 and mild retrolisthesis L5-S1 unchanged. Interbody spacer L5-S1 unchanged in position.   IMPRESSION: Bilateral pedicle screw and interbody fusion L3-4 L4-5 unchanged interbody spacer L5-S1 unchanged. No acute complication.     Electronically Signed   By: Marlan Palau M.D.   On: 12/20/2021 15:46      Labs:  Basic Metabolic Panel: Recent Labs  Lab 12/24/21 0037  NA 141  K 4.4  CL 107  CO2 27  GLUCOSE 114*  BUN 9  CREATININE 0.70  CALCIUM 9.0    CBC: Recent Labs  Lab 12/24/21 0037  WBC 9.5  HGB 12.9*  HCT 39.3  MCV 93.3  PLT 468*    CBG: Recent Labs  Lab 12/24/21 0615  12/24/21 1203 12/24/21 1653 12/24/21 2033 12/25/21 0611  GLUCAP 151* 161* 85 90 90    Brief HPI:   Scott Tucker is a 56 y.o. male with a history of lumbar spondylolisthesis with stenosis and chronic lower back pain who was admitted on 12/04/2021 for transforaminal fusion L3/4 and L4/5 by Dr. Otelia Sergeant.  Treated postoperatively with steroids which were tapered.  Continues with left thigh neuropathic pain and Lyrica titrated upward.   Hospital Course: [name] was admitted to rehab 12/13/2021 for inpatient therapies to consist of PT, ST and OT at least three hours five days a week. Past admission physiatrist, therapy team and rehab RN have worked together to provide customized collaborative inpatient rehab. Norco changed to oxycodone 5-10 mg every 4 hours to limit acetaminophen. Lyrica increased to 150 mg BID on 8/4. MS Contin 15 mg BID added. K-pad applied. Steroids were tapered. Vit D supplement weekly. Lyrica increased to TID dosing on 8/9 as well as starting duloxetine. Follow-up xrays stable on 8/10.  WBC trended downward with steroid taper. Incision healing nicely without signs of infection. Hypokalemia resolved with supplementation. Mildly pruritic skin rash on back noted. Improved with anti-histamines and Kenalog cream. Neuropsychologic consult obtained on 8/14.  Blood pressures were monitored on TID basis and remained stable.  Diabetes has been monitored with ac/hs CBG checks and SSI was use prn for tighter BS control. Jardiance and meal coverage with 4 units continued.Blood sugars well controlled and able to discontinue meal coverage at discharge.   Rehab course: During patient's stay in rehab  weekly team conferences were held to monitor patient's progress, set goals and discuss barriers to discharge. At admission, patient required min to mod assist with ADLs. CGA/min Assist with mobility and transfers.  He has had improvement in activity tolerance, balance, postural control as well as  ability to compensate for deficits. He has had improvement in functional use RUE/LUE  and RLE/LLE as well as improvement in awareness   Disposition:  Discharge disposition: 01-Home or Self Care      Diet: Carb modified  Special Instructions: No driving, alcohol consumption or tobacco use.   Plan to taper methadone to 2.5 mg BID at time of transition care appointment.  Discharge Instructions     Ambulatory referral to Physical Medicine Rehab   Complete by: As directed    Discharge patient   Complete by: As directed    Discharge disposition: 01-Home or Self Care   Discharge patient date: 12/25/2021      Allergies as of 12/25/2021       Reactions   Latex Rash        Medication List     STOP taking these medications    ciclopirox 8 % solution Commonly known as: Penlac   FLUoxetine 20 MG capsule Commonly known as: PROZAC   HYDROcodone-acetaminophen 5-325 MG tablet Commonly known as: NORCO/VICODIN   HYDROcodone-acetaminophen 7.5-325 MG tablet Commonly known as: NORCO   traZODone 50 MG tablet Commonly known as: DESYREL       TAKE these medications    amLODipine 10 MG tablet Commonly known as: NORVASC Take 1 tablet (10 mg total) by mouth once daily.   aspirin EC 81 MG tablet Take 1 tablet (81 mg total) by mouth daily. Swallow whole.   atorvastatin 40 MG tablet Commonly known as: LIPITOR Take 1 tablet (40 mg total) by mouth daily.   camphor-menthol lotion Commonly known as: SARNA Apply topically as needed for itching.   cetirizine 10 MG tablet Commonly known as: ZYRTEC Take 1 tablet (10 mg total) by mouth daily.   docusate sodium 100 MG capsule Commonly known as: COLACE Take 1 capsule (100 mg total) by mouth 2 (two) times daily.   DULoxetine 60 MG capsule Commonly known as: CYMBALTA Take 1 capsule (60 mg total) by mouth daily.   empagliflozin 25 MG Tabs tablet Commonly known as: Jardiance Take 1 tablet (25 mg total) by mouth once daily  before breakfast.   ergocalciferol 1.25 MG (50000 UT) capsule Commonly known as: VITAMIN D2 Take 1 capsule (50,000 Units total) by mouth once a week.   losartan 25 MG tablet Commonly known as: COZAAR Take 1 tablet (25 mg total) by mouth once daily. (Please make overdue appt with Dr. Eldridge Dace before anymore refills. Thank you 1st attempt)   methadone 5 MG tablet Commonly known as: DOLOPHINE Take 1 tablet (5 mg total) by mouth every 12 (twelve) hours.   methocarbamol 500 MG tablet Commonly known as: ROBAXIN Take 1 tablet (500 mg total) by mouth every 6 (six) hours as needed for muscle spasms.   nitroGLYCERIN 0.4 MG SL tablet Commonly known as: NITROSTAT Place 1 tablet (0.4 mg total) under the tongue every 5 (five) minutes as needed for chest pain.   oxyCODONE 15 MG immediate release tablet Commonly known as: ROXICODONE Take 1 tablet (15 mg total) by mouth every 6 (six) hours as needed for moderate pain or severe pain.   pregabalin 200 MG capsule Commonly known as: LYRICA Take 1 capsule (200 mg total) by mouth 3 (three)  times daily. What changed:  medication strength how much to take when to take this   triamcinolone cream 0.1 % Commonly known as: KENALOG Apply topically 3 (three) times daily.        Follow-up Information     Fanny Dance, MD Follow up.   Specialty: Physical Medicine and Rehabilitation Why: office will call you with follow up appointment Contact information: [address] [place] [address] [phone]         Kerrin Champagne, MD Follow up.   Specialty: Orthopedic Surgery Why: Call in 1-2 days for post hospital follow up Contact information: [address] [place] [address] [phone]         Grayce Sessions, NP Follow up.   Specialty: Internal Medicine Why: Call in 1-2 days for post hospital follow up Contact information: [address] [place] [address] [phone]                  Signed: Milinda Antis 12/25/2021, 10:14 AM

## 2021-12-14 NOTE — Progress Notes (Signed)
Physical Therapy Assessment and Plan  Patient Details  Name: Scott Tucker MRN: [id number] Date of Birth: March 12, 1966  PT Diagnosis: Abnormal posture, Abnormality of gait, Difficulty walking, Impaired sensation, Low back pain, Muscle weakness, and Pain in joint Rehab Potential: Good ELOS: 7-10 days   Today's Date: 12/14/2021 PT Individual Time: 6270-3500 PT Individual Time Calculation (min): 75 min    Hospital Problem: Principal Problem:   Lumbar stenosis Active Problems:   Leukocytosis   Neuropathic pain   Past Medical History:  Past Medical History:  Diagnosis Date   Anginal pain ([place])    Anxiety    Arthritis    Chronic back pain    Coronary artery disease    Diabetes mellitus without complication ([place])    type 2   Dyspnea    Headache    HTN (hypertension)    Neuromuscular disorder ([place])    Past Surgical History:  Past Surgical History:  Procedure Laterality Date   ANTERIOR CERVICAL DECOMP/DISCECTOMY FUSION N/A 12/04/2020   Procedure: ANTERIOR CERVICAL DISCECTOMY FUSION C3-4, C4-5, ALLOGRAFT, PLATE;  Surgeon: Marybelle Killings, MD;  Location: [facility];  Service: Orthopedics;  Laterality: N/A;  needs [place] and 2003(dr kritzer)   CARDIAC CATHETERIZATION     SHOULDER SURGERY Right    2017   TONSILLECTOMY     removed as a child    Assessment & Plan Clinical Impression: Patient is a 56 y.o. year old male with history of CAD, T2DM, h/o Covid 19,  ACDF, lumbar spondylolisthesis w/stenosis and chronic LBP X 1 year who was admitted on 12/04/21 for transforaminal fusion L3/4 and L4/5 with removal of hardware L5-S1 and instrumentation L3-S! With rods, screws and cages by Dr. Louanne Skye. Post op limited by pain, constipation w/abdominal distension, rise in WBC to 16.1 and continues to fluctuate as well as weakness with LLE with foot drop. Steroid dose pack added 07/30 and CT lumbar spine done 07/31 revealing stable hardware with improvement in thecal sac and left  foraminal patency, incidental finding of right renal nephrolithiasis and no acute findings.  Lyrica titrated up for mangement of left thigh neuropathic pain. WBC continues to fluctuate but remains afebrile and ABLA stable. Therapy has been working with patient who continues to be limited by pain and weakness LLE. CIR recommended due to functional decline.Patient transferred to CIR on 12/13/2021 .   Patient currently requires min with mobility secondary to muscle weakness, decreased cardiorespiratoy endurance, unbalanced muscle activation, decreased coordination, and decreased motor planning, and decreased standing balance, decreased postural control, and decreased balance strategies.  Prior to hospitalization, patient was modified independent  with mobility and lived with Alone in a Other(Comment) (condo, 2 levels) home.  Home access is 5 steps followed by landing then 2 additional steps with no rails to enter. 20-25 steps to access 2nd floor with right side railingStairs to enter.  Patient will benefit from skilled PT intervention to maximize safe functional mobility, minimize fall risk, and decrease caregiver burden for planned discharge home with intermittent assist.  Anticipate patient will benefit from follow up OP at discharge.  PT - End of Session Activity Tolerance: Tolerates 30+ min activity with multiple rests Endurance Deficit: Yes Endurance Deficit Description: limited due to pain, weakness and balance/coordination deficits PT Assessment Rehab Potential (ACUTE/IP ONLY): Good PT Barriers to Discharge: [place] home environment;Home environment access/layout;Decreased caregiver support;Lack of/limited family support PT Barriers to Discharge Comments: lives alone in 2 story condo with 7 steps  to enter without rails and 20-25 steps to access 2nd floor with bed/bath. unable to live on main level PT Patient demonstrates impairments in the following area(s):  Balance;Pain;Endurance;Sensory;Motor PT Transfers Functional Problem(s): Bed Mobility;Bed to Chair;Car PT Locomotion Functional Problem(s): Ambulation;Stairs;Wheelchair Mobility PT Plan PT Intensity: Minimum of 1-2 x/day ,45 to 90 minutes PT Frequency: 5 out of 7 days PT Duration Estimated Length of Stay: 7-10 days PT Treatment/Interventions: Ambulation/gait training;Discharge planning;DME/adaptive equipment instruction;Functional mobility training;Pain management;Psychosocial support;Splinting/orthotics;Therapeutic Activities;UE/LE Strength taining/ROM;Balance/vestibular training;Community reintegration;Disease management/prevention;Functional electrical stimulation;Neuromuscular re-education;Patient/family education;Stair training;Therapeutic Exercise;UE/LE Coordination activities;Wheelchair propulsion/positioning PT Transfers Anticipated Outcome(s): Mod I PT Locomotion Anticipated Outcome(s): Mod I PT Recommendation Follow Up Recommendations: Outpatient PT Patient destination: Home Equipment Recommended: To be determined Equipment Details: TBD   PT Evaluation Precautions/Restrictions Precautions Precautions: Back;Fall Precaution Comments: reviewed 3/3 back precautions (no bending, lifting, twisting) Required Braces or Orthoses: Spinal Brace Spinal Brace: Applied in sitting position;Lumbar corset Restrictions Weight Bearing Restrictions: No Pain Interference Pain Interference Pain Effect on Sleep: 4. Almost constantly Pain Interference with Therapy Activities: 4. Almost constantly Pain Interference with Day-to-Day Activities: 4. Almost constantly Home Living/Prior Functioning Home Living Available Help at Discharge: Available PRN/intermittently (girlfriend and adult son) Type of Home: Other(Comment) (condo, 2 levels) Home Access: Stairs to enter [employer] of Steps: 5 steps followed by landing then 2 additional steps with no rails to enter. 20-25 steps to access 2nd  floor with right side railing Entrance Stairs-Rails: None Home Layout: 1/2 bath on main level;Bed/bath upstairs Bathroom Shower/Tub: Chiropodist: Standard Bathroom Accessibility: Yes Additional Comments: 1/2 bath downstairs will not fit walker or w/c  Lives With: Alone Prior Function Level of Independence: Requires assistive device for independence  Able to Take Stairs?: Yes Driving: Yes Vocation: On disability Leisure: Hobbies-yes (Comment) (outdoors (hunting, fishing)) Vision/Perception  Vision - History Ability to See in Adequate Light: 0 Adequate Perception Perception: Within Functional Limits Praxis Praxis: Intact  Cognition Overall Cognitive Status: Within Functional Limits for tasks assessed Arousal/Alertness: Awake/alert Orientation Level: Oriented X4 Attention: Selective Selective Attention: Impaired Selective Attention Impairment: Verbal complex;Functional complex Memory: Impaired Awareness: Appears intact Problem Solving: Appears intact Safety/Judgment: Appears intact Comments: Self-reports trouble with memory but nothing new Sensation Sensation Light Touch: Impaired by gross assessment Hot/Cold: Appears Intact Proprioception: Impaired by gross assessment Additional Comments: diminished sensation on left compared to right (L4, L5, S1) Coordination Gross Motor Movements are Fluid and Coordinated: No Fine Motor Movements are Fluid and Coordinated: No Coordination and Movement Description: grossly impaired due to low back pain, weakness and balance deficits Finger Nose Finger Test: intact Heel Shin Test: impaired ablet to attain position but limition range due to pain Motor  Motor Motor: Other (comment) (grossly impaired due to pain, weakness, coordination/balance deficits and decreased activity tolerance) Motor - Skilled Clinical Observations: grossly impaired due to low back pain, weakness, coordination/balance deficits and decreased  activity tolerance   Trunk/Postural Assessment  Cervical Assessment Cervical Assessment: Exceptions to [facility] (forward head and rounded shoulders) Thoracic Assessment Thoracic Assessment: Exceptions to [facility] (thoracic kyphosis) Lumbar Assessment Lumbar Assessment: Exceptions to [facility] (posterior pelvic tilt) Postural Control Postural Control: Deficits on evaluation (impaired due to low back pain and coordination/balance deficits) Trunk Control: impaired due to low back pain, weakness and balance/coordination deficits Postural Limitations: limited d/t chronic and acute back pain  Balance Balance Balance Assessed: Yes Static Sitting Balance Static Sitting - Balance Support: Feet supported Static Sitting - Level of Assistance: 5: Stand by assistance Dynamic Sitting Balance Dynamic Sitting - Balance  Support: Feet supported (SBA) Dynamic Sitting - Level of Assistance: 5: Stand by assistance (CGA) Dynamic Sitting - Balance Activities: Reaching for objects Sitting balance - Comments: LE coordination/balance activities Static Standing Balance Static Standing - Balance Support: Right upper extremity supported (SPC) Static Standing - Level of Assistance: 4: Min assist Dynamic Standing Balance Dynamic Standing - Balance Support: Right upper extremity supported Dynamic Standing - Level of Assistance: 4: Min assist Dynamic Standing - Balance Activities: Other (comment) (with functional activity) Dynamic Standing - Comments: Able to stand and use urinal and manipulate LB clothing with some impaired balance but no loss of balance Extremity Assessment  RLE Assessment RLE Assessment: Not tested (unable to perform formal MMT due to pain) General Strength Comments: unable to perform formal MMT due to pain LLE Assessment LLE Assessment: Not tested (unable to perform formal MMT due to pain) General Strength Comments: unable to perform formal MMT due to pain  Care Tool Care Tool Bed Mobility Roll left  and right activity   Roll left and right assist level: Contact Guard/Touching assist    Sit to lying activity   Sit to lying assist level: Contact Guard/Touching assist    Lying to sitting on side of bed activity   Lying to sitting on side of bed assist level: the ability to move from lying on the back to sitting on the side of the bed with no back support.: Contact Guard/Touching assist     Care Tool Transfers Sit to stand transfer   Sit to stand assist level: Contact Guard/Touching assist    Chair/bed transfer   Chair/bed transfer assist level: Contact Guard/Touching assist     Toilet transfer   Assist Level: Minimal Assistance - Patient > 75%    Car transfer   Car transfer assist level: Contact Guard/Touching assist      Care Tool Locomotion Ambulation   Assist level: Minimal Assistance - Patient > 75% Assistive device: Cane-straight (L UE on rail. R UE with SPC) Max distance: 35 feet  Walk 10 feet activity   Assist level: Minimal Assistance - Patient > 75% Assistive device: Cane-straight (L UE on rail. R UE SPC)   Walk 50 feet with 2 turns activity Walk 50 feet with 2 turns activity did not occur: Safety/medical concerns (unable to perform due to pain, weakness and decreased activity tolerance)      Walk 150 feet activity Walk 150 feet activity did not occur: Safety/medical concerns (unable to perform due to pain, weakness and decreased activity tolerance)      Walk 10 feet on uneven surfaces activity   Assist level: Minimal Assistance - Patient > 75% Assistive device: Cane-straight (SPC and rail)  Stairs   Assist level: Minimal Assistance - Patient > 75% Stairs assistive device: 2 hand rails Max number of stairs: 8 (6 inches)  Walk up/down 1 step activity   Walk up/down 1 step (curb) assist level: Minimal Assistance - Patient > 75% Walk up/down 1 step or curb assistive device: 2 hand rails  Walk up/down 4 steps activity   Walk up/down 4 steps assist level:  Minimal Assistance - Patient > 75% Walk up/down 4 steps assistive device: 2 hand rails  Walk up/down 12 steps activity Walk up/down 12 steps activity did not occur: Safety/medical concerns (unable to perform due to pain, weakness and decreased activity tolerance)      Pick up small objects from floor Pick up small object from the floor (from standing position) activity did not occur: Safety/medical concerns (  unable to complete due to bending restrictions)      Wheelchair Is the patient using a wheelchair?: Yes Type of Wheelchair: Manual   Wheelchair assist level: Supervision/Verbal cueing Max wheelchair distance: 50 feet  Wheel 50 feet with 2 turns activity   Assist Level: Supervision/Verbal cueing  Wheel 150 feet activity Wheelchair 150 feet activity did not occur: Safety/medical concerns (unable to perform due to pain, weakness and decreased activity tolerance)      Refer to Care Plan for Long Term Goals  SHORT TERM GOAL WEEK 1 PT Short Term Goal 1 (Week 1): STG=LTG due to ELOS  Recommendations for other services: None   Skilled Therapeutic Intervention   Pt received semi-reclined in bed and reports 7/10 low back pain at rest, pt pre-medicated. Pt reports onset of left foot drop ~ 2 years ago. Pt reports at baseline he utilizes wooden stick as cane and has had multiple falls. Pt verbalized and adhered to spine precautions throughout session. Pt requires CGA for balance with log rolling to sit edge of bed, sit to stand with SPC, and with stand pivot from bed to w/c with SPC. Pt ambulated 35 ft with SPC and intermittent railing support with min A. Pt presents with left LE weakness resulting in decreased foot clearance and mild knee instability during gait. Pt transported to main gym for time management and energy conservation. Pt negotiated 8 steps (6 inches) with 2 HR's and min A. Pt ascends steps with reciprocal pattern and descends steps with step-to and requires verbal cueing for LE  placement/sequencing. Pt transported to ortho gym and performed car transfer CGA with single point cane and requires min A with ramp negotiation with SPC and intermittent rail support. Pt propelled w/c ~50 ft with bilateral UE and required supervision for safety. Pt transported back to hospital room and left seated edge of bed with bed alarm on and all needs within reach.   Mobility Bed Mobility Bed Mobility: Rolling Left;Supine to Sit;Sitting - Scoot to [employer] of Bed;Right Sidelying to Sit Rolling Left: Contact Guard/Touching assist Right Sidelying to Sit: Contact Guard/Touching assist Supine to Sit: Contact Guard/Touching assist Sitting - Scoot to Edge of Bed: Contact Guard/Touching assist Sit to Supine: Minimal Assistance - Patient > 75% Sit to Sidelying Right: Contact Guard/Touching assist Transfers Transfers: Sit to Stand;Stand to Sit;Stand Pivot Transfers Sit to Stand: Contact Guard/Touching assist Stand to Sit: Contact Guard/Touching assist Stand Pivot Transfers: Contact Guard/Touching assist Transfer (Assistive device): Straight cane Locomotion  Gait Ambulation: Yes Gait Assistance: Minimal Assistance - Patient > 75% Gait Distance (Feet): 35 Feet Assistive device: Straight cane Gait Assistance Details: Verbal cues for precautions/safety Gait Assistance Details: L LE with decreased foot clearance and mild knee instability Gait Gait: Yes Gait Pattern: Impaired Gait Pattern: Antalgic;Poor foot clearance - left;Shuffle;Wide base of support Gait velocity: decreased Stairs / Additional Locomotion Stairs: Yes Stairs Assistance: Minimal Assistance - Patient > 75% Stair Management Technique: Two rails Number of Stairs: 8 Height of Stairs: 6 Ramp: Minimal Assistance - Patient >75% Curb: Minimal Assistance - Patient >75% Wheelchair Mobility Wheelchair Mobility: Yes Wheelchair Assistance: Chartered loss adjuster: Both upper extremities Wheelchair Parts  Management: Needs assistance Distance: 50 feet   Discharge Criteria: Patient will be discharged from PT if patient refuses treatment 3 consecutive times without medical reason, if treatment goals not met, if there is a change in medical status, if patient makes no progress towards goals or if patient is discharged from hospital.  The above assessment, treatment  plan, treatment alternatives and goals were discussed and mutually agreed upon: by patient  Tanja Port PT, DPT  12/14/2021, 2:45 PM

## 2021-12-14 NOTE — Progress Notes (Signed)
Occupational Therapy Session Note  Patient Details  Name: Scott Tucker MRN: [id number] Date of Birth: 01-Nov-1965  Today's Date: 12/14/2021 OT Individual Time: 1102-1200 OT Individual Time Calculation (min): 58 min    Short Term Goals: Week 1:  OT Short Term Goal 1 (Week 1): Pt will complete functional mobility for 70 ft. with LRAD with Min A for increased independence in ADLs around the house. OT Short Term Goal 2 (Week 1): Pt will complete LB with Min A using AE as needed. OT Short Term Goal 3 (Week 1): Pt will maintain dynamic standing balance with Min A while engaging in functional activity. OT Short Term Goal 4 (Week 1): Pt will recall 3/3 back precautions with min cueing for safety in ADLs.  Skilled Therapeutic Interventions/Progress Updates:  Session 2: Pt received sitting in w/c and agreeable to OT session. Reporting pain is still 8/10 rest provided throughout session as needed. Pt stood from bed with CGA and RW. Transported to day gym for time management. On NuStep, pt  completed 10 minutes at load 2. Average was 16-20 steps per minute for the duration of the activity. Pt seated EOM able to locate 7 clothes pin from UB/LB while maintaining back precautions. Pt requesting to use the bathroom. Stood at EOB and voided bladder in urinal. Pt able to manipulate pants and maintain balance. No LOB noted but swaying and reported feeling off balance. Pt left supine in bed with heat pack on LLE, alarm on, call bell in reach, and all needs met.  Therapy Documentation Precautions:  Precautions Precautions: Back Precaution Comments: reviewed 3/3 back precautions (no bending, lifting, twisting) Required Braces or Orthoses: Spinal Brace Spinal Brace: Applied in sitting position, Lumbar corset Restrictions Weight Bearing Restrictions: No   Therapy/Group: Individual Therapy  Catalina Lunger 12/14/2021, 12:24 PM

## 2021-12-14 NOTE — Progress Notes (Signed)
PROGRESS NOTE   Subjective/Complaints:  Reports significant burning pain in his legs.  He reports pain much improved since medications adjusted yesterday.  Review of Systems  Constitutional:  Negative for chills and fever.  Eyes:  Negative for double vision.  Respiratory:  Negative for cough.   Cardiovascular:  Negative for chest pain and palpitations.  Gastrointestinal:  Negative for abdominal pain.  Musculoskeletal:  Positive for back pain and joint pain.  Neurological:  Positive for weakness.    Objective:   DG Chest 2 View  Result Date: 12/13/2021 CLINICAL DATA:  Recent lumbar surgery, not feeling well EXAM: CHEST - 2 VIEW COMPARISON:  03/13/2020 FINDINGS: Chronic elevation of the right hemidiaphragm. Cardiac and mediastinal contours are within normal limits. No focal pulmonary opacity. No pleural effusion or pneumothorax. No acute osseous abnormality. IMPRESSION: No acute cardiopulmonary process. Electronically Signed   By: Wiliam Ke M.D.   On: 12/13/2021 12:52   Recent Labs    12/13/21 0146 12/14/21 1003  WBC 14.1* 17.8*  HGB 11.9* 13.1  HCT 35.0* 37.2*  PLT 452* 531*   Recent Labs    12/13/21 0146 12/14/21 1003  NA 139 138  K 4.2 3.3*  CL 105 106  CO2 27 25  GLUCOSE 131* 123*  BUN 12 14  CREATININE 0.67 0.73  CALCIUM 8.9 9.0    Intake/Output Summary (Last 24 hours) at 12/14/2021 1220 Last data filed at 12/14/2021 0900 Gross per 24 hour  Intake 120 ml  Output 1801 ml  Net -1681 ml        Physical Exam: Vital Signs Blood pressure 126/73, pulse 72, temperature 97.9 F (36.6 C), temperature source Oral, resp. rate 16, height 5\' 6"  (1.676 m), weight 86.2 kg, SpO2 98 %.    General: Alert and oriented x 3, No apparent distress HEENT: Head is normocephalic, atraumatic, PERRLA, EOMI, sclera anicteric, oral mucosa pink and moist  Neck: Supple without JVD or lymphadenopathy Heart: Reg rate and rhythm.  No murmurs rubs or gallops Chest: CTA bilaterally without wheezes, rales, or rhonchi; no distress Abdomen: Soft, non-tender, non-distended, bowel sounds positive. Extremities: No clubbing, cyanosis, or edema. Pulses are 2+ Psych: Pt's affect is appropriate. Pt is cooperative Musculoskeletal:     Cervical back: Neck supple. No tenderness.     Right lower leg: No edema.     Left lower leg: No edema.     Comments: UE 5/5 B/L  LE's 4/5- limited due to pain, but DF/PF also appear a little weak B/L   Skin:    General: Skin is warm and dry.     Comments: Surgical incision with staples, dressing CDI Neurological:     General: No focal deficit present.     Mental Status: He is alert and oriented to person, place, and time.     Comments: Decreased to light touch from L knee down to toes Intact on RLE       Assessment/Plan: 1. Functional deficits which require 3+ hours per day of interdisciplinary therapy in a comprehensive inpatient rehab setting. Physiatrist is providing close team supervision and 24 hour management of active medical problems listed below. Physiatrist and rehab team continue to assess  barriers to discharge/monitor patient progress toward functional and medical goals  Care Tool:  Bathing              Bathing assist       Upper Body Dressing/Undressing Upper body dressing        Upper body assist      Lower Body Dressing/Undressing Lower body dressing            Lower body assist       Toileting Toileting    Toileting assist       Transfers Chair/bed transfer  Transfers assist           Locomotion Ambulation   Ambulation assist              Walk 10 feet activity   Assist           Walk 50 feet activity   Assist           Walk 150 feet activity   Assist           Walk 10 feet on uneven surface  activity   Assist           Wheelchair     Assist               Wheelchair 50 feet with  2 turns activity    Assist            Wheelchair 150 feet activity     Assist          Blood pressure 126/73, pulse 72, temperature 97.9 F (36.6 C), temperature source Oral, resp. rate 16, height 5\' 6"  (1.676 m), weight 86.2 kg, SpO2 98 %.  Medical Problem List and Plan: 1. Functional deficits secondary to lumbar stenosis s/p lumbar fusion L3-4, L4-5             -patient may  shower-cover incision             -ELOS/Goals: 7-10 days- supervision  -CIR, PT/OT evals 2.  Antithrombotics: -DVT/anticoagulation:  Mechanical: Sequential compression devices, below knee Bilateral lower extremities--will check dopplers due to immobility/LLE weakness.              -antiplatelet therapy: aspirin 81 mg daily 3. Pain Management: MS contin add for more consistent pain relief.  --Norco changed to oxycodone as getting close to 4 gram tylenol. 5-10 mg q4 hours prn --continue robaxin prn for muscle spasms.              -Lyrica 75 mg BID- increased as of 8/56 yo 150 mg BID             -added MS Contin 15 mg BID and d/c'd scheduled Norco and prn Norco             -K-pad  -7/4 pain much improved with medication adjustments 4. Mood/Behavior/Sleep: LCSW to evaluate and provide emotional support             -antipsychotic agents: n/a 5. Neuropsych/cognition: This patient is capable of making decisions on his own behalf. 6. Skin/Wound Care: Routine skin care checks             -monitor surgical incision 7. Fluids/Electrolytes/Nutrition: Routine Is and Os and follow-up chemistries 8: S/p Lumbar fusion Dr. 10/4 7/25. Maintain Aspen brace             -continue prednisone taper  9: Hypertension: continue Norvasc, Cozaar 10: Hyperlipidemia: continue Lipitor 11: DM-2: CBGs QID             -  continue Jardiance, insulin 4 units with meals, SS  -CBGs well controlled, follow trend 12. Leucocytosis: WBC 16.1 on POD#1--likely reactive  with + steroids 7/30.   --Continue to monitor for signs of  infection. CRP-0.6 (on 08/03)             --8/4 WBC higher today at 17.8, no signs of infection noted at this time, likely related to steroids, will recheck CBC tomorrow  13. Vitamin D deficiency: 06/15 labs w/Vit D-10.3. Continue Ergocalciferol weekly.  14. Hypokalemia  -KCL x2, recheck BMET tomorrow  LOS: 1 days A FACE TO FACE EVALUATION WAS PERFORMED  Fanny Dance 12/14/2021, 12:20 PM

## 2021-12-14 NOTE — Progress Notes (Signed)
Inpatient Rehabilitation  Patient information reviewed and entered into eRehab system by [place] Duquette M. Dona Walby, M.A., CCC/SLP, PPS Coordinator.  Information including medical coding, functional ability and quality indicators will be reviewed and updated through discharge.    

## 2021-12-15 DIAGNOSIS — M48062 Spinal stenosis, lumbar region with neurogenic claudication: Secondary | ICD-10-CM | POA: Diagnosis not present

## 2021-12-15 LAB — BASIC METABOLIC PANEL
Anion gap: 7 (ref 5–15)
BUN: 10 mg/dL (ref 6–20)
CO2: 25 mmol/L (ref 22–32)
Calcium: 8.5 mg/dL — ABNORMAL LOW (ref 8.9–10.3)
Chloride: 105 mmol/L (ref 98–111)
Creatinine, Ser: 0.56 mg/dL — ABNORMAL LOW (ref 0.61–1.24)
GFR, Estimated: >60 mL/min (ref >60)
Glucose, Bld: 103 mg/dL — ABNORMAL HIGH (ref 70–99)
Potassium: 3.7 mmol/L (ref 3.5–5.1)
Sodium: 137 mmol/L (ref 135–145)

## 2021-12-15 LAB — CBC WITH DIFFERENTIAL/PLATELET
Abs Immature Granulocytes: 0 10*3/uL (ref 0.00–0.07)
Basophils Absolute: 0 10*3/uL (ref 0.0–0.1)
Basophils Relative: 0 %
Eosinophils Absolute: 0 10*3/uL (ref 0.0–0.5)
Eosinophils Relative: 0 %
HCT: 32.4 % — ABNORMAL LOW (ref 39.0–52.0)
Hemoglobin: 11.5 g/dL — ABNORMAL LOW (ref 13.0–17.0)
Lymphocytes Relative: 40 %
Lymphs Abs: 5.7 10*3/uL — ABNORMAL HIGH (ref 0.7–4.0)
MCH: 32.4 pg (ref 26.0–34.0)
MCHC: 35.5 g/dL (ref 30.0–36.0)
MCV: 91.3 fL (ref 80.0–100.0)
Monocytes Absolute: 0.6 10*3/uL (ref 0.1–1.0)
Monocytes Relative: 4 %
Neutro Abs: 8 10*3/uL — ABNORMAL HIGH (ref 1.7–7.7)
Neutrophils Relative %: 56 %
Platelets: 457 10*3/uL — ABNORMAL HIGH (ref 150–400)
RBC: 3.55 MIL/uL — ABNORMAL LOW (ref 4.22–5.81)
RDW: 12.5 % (ref 11.5–15.5)
WBC: 14.2 10*3/uL — ABNORMAL HIGH (ref 4.0–10.5)
nRBC: 0 % (ref 0.0–0.2)
nRBC: 0 /100 WBC

## 2021-12-15 LAB — GLUCOSE, CAPILLARY
Glucose-Capillary: 93 mg/dL (ref 70–99)
Glucose-Capillary: 120 mg/dL — ABNORMAL HIGH (ref 70–99)
Glucose-Capillary: 176 mg/dL — ABNORMAL HIGH (ref 70–99)
Glucose-Capillary: 122 mg/dL — ABNORMAL HIGH (ref 70–99)

## 2021-12-15 MED ORDER — OXYCODONE HCL 5 MG PO TABS
10.0000 mg | ORAL_TABLET | ORAL | Status: DC | PRN
  Administered 2021-12-15 – 2021-12-24 (×41): 15 mg via ORAL
  Filled 2021-12-15 (×43): qty 3

## 2021-12-15 NOTE — Progress Notes (Signed)
PROGRESS NOTE   Subjective/Complaints:  Pt reports last night had pain spike again- asking for IV pain meds, but explained we don't do them on CIR- only PO meds.  Couldn't go back to sleep as a result.  But overall pain much better.   Thinks getting meds for sleep but couldn't go back to sleep after "turned the wrong way'.    ROS  Pt denies SOB, abd pain, CP, N/V/C/D, and vision changes   Objective:   No results found. Recent Labs    12/14/21 1003 12/15/21 0518  WBC 17.8* 14.2*  HGB 13.1 11.5*  HCT 37.2* 32.4*  PLT 531* 457*    Recent Labs    12/14/21 1003 12/15/21 0518  NA 138 137  K 3.3* 3.7  CL 106 105  CO2 25 25  GLUCOSE 123* 103*  BUN 14 10  CREATININE 0.73 0.56*  CALCIUM 9.0 8.5*     Intake/Output Summary (Last 24 hours) at 12/15/2021 1822 Last data filed at 12/15/2021 1325 Gross per 24 hour  Intake 712 ml  Output 1475 ml  Net -763 ml         Physical Exam: Vital Signs Blood pressure 135/82, pulse 92, temperature 98.2 F (36.8 C), temperature source Oral, resp. rate 18, height 5\' 6"  (1.676 m), weight 86.2 kg, SpO2 95 %.      General: awake, alert, appropriate, sitting in w/c at bedside; Nurse in room; NAD HENT: conjugate gaze; oropharynx moist CV: regular rate; no JVD Pulmonary: CTA B/L; no W/R/R- good air movement GI: soft, NT, ND, (+)BS Psychiatric: appropriate- asking about IV pain meds Neurological: Ox3 Extremities: No clubbing, cyanosis, or edema. Pulses are 2+ Skin: incision on lower back looks great- staples intact but no drainage or redness Psych: Pt's affect is appropriate. Pt is cooperative Musculoskeletal:     Cervical back: Neck supple. No tenderness.     Right lower leg: No edema.     Left lower leg: No edema.     Comments: UE 5/5 B/L  LE's 4/5- limited due to pain, but DF/PF also appear a little weak B/L   Skin:    General: Skin is warm and dry.     Comments:  Surgical incision with staples, dressing CDI Neurological:     General: No focal deficit present.     Mental Status: He is alert and oriented to person, place, and time.     Comments: Decreased to light touch from L knee down to toes Intact on RLE       Assessment/Plan: 1. Functional deficits which require 3+ hours per day of interdisciplinary therapy in a comprehensive inpatient rehab setting. Physiatrist is providing close team supervision and 24 hour management of active medical problems listed below. Physiatrist and rehab team continue to assess barriers to discharge/monitor patient progress toward functional and medical goals  Care Tool:  Bathing    Body parts bathed by patient: Left arm, Right arm, Chest, Abdomen, Front perineal area, Face   Body parts bathed by helper: Left upper leg, Right lower leg, Left lower leg, Right upper leg, Buttocks     Bathing assist Assist Level: Maximal Assistance - Patient 24 - 49%  Upper Body Dressing/Undressing Upper body dressing   What is the patient wearing?: Pull over shirt    Upper body assist Assist Level: Contact Guard/Touching assist    Lower Body Dressing/Undressing Lower body dressing      What is the patient wearing?: Pants     Lower body assist Assist for lower body dressing: Moderate Assistance - Patient 50 - 74%     Toileting Toileting    Toileting assist Assist for toileting: Moderate Assistance - Patient 50 - 74%     Transfers Chair/bed transfer  Transfers assist     Chair/bed transfer assist level: Contact Guard/Touching assist     Locomotion Ambulation   Ambulation assist      Assist level: Minimal Assistance - Patient > 75% Assistive device: Cane-straight (L UE on rail. R UE with SPC) Max distance: 35 feet   Walk 10 feet activity   Assist     Assist level: Minimal Assistance - Patient > 75% Assistive device: Cane-straight (L UE on rail. R UE SPC)   Walk 50 feet  activity   Assist Walk 50 feet with 2 turns activity did not occur: Safety/medical concerns (unable to perform due to pain, weakness and decreased activity tolerance)         Walk 150 feet activity   Assist Walk 150 feet activity did not occur: Safety/medical concerns (unable to perform due to pain, weakness and decreased activity tolerance)         Walk 10 feet on uneven surface  activity   Assist     Assist level: Minimal Assistance - Patient > 75% Assistive device: Cane-straight (SPC and rail)   Wheelchair     Assist Is the patient using a wheelchair?: Yes Type of Wheelchair: Manual    Wheelchair assist level: Supervision/Verbal cueing Max wheelchair distance: 50 feet    Wheelchair 50 feet with 2 turns activity    Assist        Assist Level: Supervision/Verbal cueing   Wheelchair 150 feet activity     Assist  Wheelchair 150 feet activity did not occur: Safety/medical concerns (unable to perform due to pain, weakness and decreased activity tolerance)       Blood pressure 135/82, pulse 92, temperature 98.2 F (36.8 C), temperature source Oral, resp. rate 18, height 5\' 6"  (1.676 m), weight 86.2 kg, SpO2 95 %.  Medical Problem List and Plan: 1. Functional deficits secondary to lumbar stenosis s/p lumbar fusion L3-4, L4-5             -patient may  shower-cover incision             -ELOS/Goals: 7-10 days- supervision  -Con't CIR- PT, and OT 2.  Antithrombotics: -DVT/anticoagulation:  Mechanical: Sequential compression devices, below knee Bilateral lower extremities--will check dopplers due to immobility/LLE weakness.              -antiplatelet therapy: aspirin 81 mg daily 3. Pain Management: MS contin add for more consistent pain relief.  --Norco changed to oxycodone as getting close to 4 gram tylenol. 5-10 mg q4 hours prn --continue robaxin prn for muscle spasms.              -Lyrica 75 mg BID- increased as of 8/56 yo 150 mg BID              -added MS Contin 15 mg BID and d/c'd scheduled Norco and prn Norco             -K-pad  -7/4  pain much improved with medication adjustments  8/5- will increase to 10-15 mg q4 hours prn for Oxy- made clear we don't do IV pain meds here.  4. Mood/Behavior/Sleep: LCSW to evaluate and provide emotional support             -antipsychotic agents: n/a 5. Neuropsych/cognition: This patient is capable of making decisions on his own behalf. 6. Skin/Wound Care: Routine skin care checks             -monitor surgical incision 7. Fluids/Electrolytes/Nutrition: Routine Is and Os and follow-up chemistries 8: S/p Lumbar fusion Dr. Louanne Skye 7/25. Maintain Aspen brace             -continue prednisone taper  9: Hypertension: continue Norvasc, Cozaar 10: Hyperlipidemia: continue Lipitor 11: DM-2: CBGs QID             -continue Jardiance, insulin 4 units with meals, SS  -CBGs well controlled, follow trend 12. Leucocytosis: WBC 16.1 on POD#1--likely reactive  with + steroids 7/30.   --Continue to monitor for signs of infection. CRP-0.6 (on 08/03)             --8/4 WBC higher today at 17.8, no signs of infection noted at this time, likely related to steroids, will recheck CBC tomorrow  8/5- WBC down to 14.2- con't regimen  13. Vitamin D deficiency: 06/15 labs w/Vit D-10.3. Continue Ergocalciferol weekly.  14. Hypokalemia  -KCL 38meq x2, recheck BMET tomorrow  8/5- K+ level 3.7- con't to monitor trend   I spent a total of 36    minutes on total care today- >50% coordination of care- due to prolonged time in room with pt and d/w nursing.   LOS: 2 days A FACE TO FACE EVALUATION WAS PERFORMED  Scott Tucker 12/15/2021, 6:22 PM

## 2021-12-15 NOTE — Progress Notes (Signed)
Occupational Therapy Session Note  Patient Details  Name: Scott Tucker MRN: [id number] Date of Birth: 05-30-1965  Today's Date: 12/15/2021 OT Individual Time: 1115-1200 & 1530-1630 OT Individual Time Calculation (min): 45 min & 60 min   Short Term Goals: Week 1:  OT Short Term Goal 1 (Week 1): Pt will complete functional mobility for 70 ft. with LRAD with Min A for increased independence in ADLs around the house. OT Short Term Goal 2 (Week 1): Pt will complete LB with Min A using AE as needed. OT Short Term Goal 3 (Week 1): Pt will maintain dynamic standing balance with Min A while engaging in functional activity. OT Short Term Goal 4 (Week 1): Pt will recall 3/3 back precautions with min cueing for safety in ADLs.  Skilled Therapeutic Interventions/Progress Updates:  Session 1:   Upon OT arrival, pt seated in w/c reporting pain at 10/10 and nurse providing medication at beginning of session. Pt agreeable to OT treatment. Treatment intervention with a focus on LB dressing with AE and standing tolerance. Pt was educated on AE including sock aid, reacher, shoe horn, and long handled sponge. Pt also educated on where to purchase and S/O also informed. Verbal and visual demonstration provided for equipment and pt manages footwear with Supervision and increased time. Pt appreciative of equipment. Pt then requesting engage in standing task. Pt completes sit to stand transfer with CGA and stands with CGA to sort cards using B UE based on suit and in numerical order. Pt has 1 UE on tabletop during task for support and is able to stand for 7 minutes with 1 LOB but was able to correct himself. Pt completes stand to sit transfer with CGA and propels himself back to his room using B UE for light endurance/strengthening requiring short rest breaks to return to room. Pt was left in w/c at end of session with S/O present in room and all needs met.   Session 2: Upon OT arrival, pt seated in w/c reporting pain  in lower back. Pt agreeable to OT treatment session. Treatment intervention with a focus on strengthening, endurance, functional transfers and functional mobility. Pt transports himself to main therapy gym via w/c and Supervision. Pt completes 2 x 10 reps of B UE exercises including shoulder flex/ext, shoulder abd/add, elbow flex/ext, overhead press, chest press, supinat/pronat, wrist flex/ext. Pt completes using a 1lb dumbbell and requires occasional rest breaks and extended time to complete. Pt then transports to tub shower room to engage in tub transfer training with w/c and with RW. Bathroom setup was discussed and pt completes going into shower to the R side and out to the L side. Pt completes tub transfer with w/c and SBA and with RW with CGA. Pt performs slowly and safely. Pt then ambulates from tub shower room to main therapy gym ~175' with CGA and increased time. Pt performs stand to sit transfer with CGA and was transported back to his room via w/c and Total A. Pt was left in the w/c with all needs met.   Therapy Documentation Precautions:  Precautions Precautions: Back, Fall Precaution Comments: reviewed 3/3 back precautions (no bending, lifting, twisting) Required Braces or Orthoses: Spinal Brace Spinal Brace: Applied in sitting position, Lumbar corset Restrictions Weight Bearing Restrictions: No   Therapy/Group: Individual Therapy  Marvetta Gibbons 12/15/2021, 12:33 PM

## 2021-12-15 NOTE — Progress Notes (Signed)
Physical Therapy Session Note  Patient Details  Name: Scott Tucker MRN: [id number] Date of Birth: Aug 13, 1965  Today's Date: 12/15/2021 PT Individual Time: 5176-1607 session 1, 13:07-1398 session 2 PT Individual Time Calculation (min): 42 min session 1, 57 min session 2  Short Term Goals: Week 1:  PT Short Term Goal 1 (Week 1): STG=LTG due to ELOS  Skilled Therapeutic Interventions/Progress Updates:  Session 1 Pt seated in wheelchair on arrival to room. Agreeable to PT at this time. Reporting pain at 5/10, has had pain meds.    TRANSFERS/THERAPEUTIC ACTIVITY: Pt able to self don pants on bil LE's while seated and pull them up in standing with single UE support on walker with CGA for balance. Pt able to self doff gown/don shirt in siting with set up assist. Pt able to self don back brace in sitting.  Sit<>stands with CGA, increased time and effort needed with cues on hand placement.   GAIT: Gait pattern: step through pattern, decreased step length- Left, decreased stride length, decreased ankle dorsiflexion- Left, genu recurvatum- Left, narrow BOS, and poor foot clearance- Left Distance walked: from room to exit sign at end of hallway near nursing station, around dayroom between tasks Assistive device utilized: Walker - 2 wheeled Level of assistance: CGA Comments: slow and guard gait with left toes/foot occasionally scuffing/catching on floor. Cues to relax UE's/decr weight bearing through UE's. Wheelchair pulled behind pt for safety.     STRENGTHENING:  NuStep UE/LE's level 3 x 10 minutes with goal >/=30 steps per minute for strengthening, reciprocal movements and activity tolerance.   Pt transported back to room by PTA in wheelchair. Pt left in room in wheelchair with all needs in reach.   Session 2 Pt seated in whee\chair on arrival with girlfriend in room. Agreeable to PT at this time. Does rate pain at 10/10 in lower back down left leg. Had pain meds about 1.5 hours ago. Pt  transported to/from Dayroom in wheelchair by PTA for energy conservation and time management.   TRANSFERS: Sit<>stand w/c<>RW CGA with increased time/effort needed  STRENGTHENING:  Seated in wheelchair  with 2.5 ankle weights: heel toe raises, alternating marching, and long arc quads for 2 sets of 10 reps each.  With red band resistance: heel slides/HS curls and clamshells 2 sets of 10 reps each/each side.   BALANCE:  With RW: weaving around 4 cones<>stepping over 2 long weighted bars with up to min assist needed for balance x 2 laps. Cues for step length and walker position with weaving around cones and for sequencing with stepping over weight bars.   Pt left in wheelchair in room with all needs in reach. No change in pain level with session. RN aware and to bring medication when due.    Therapy Documentation Precautions:  Precautions Precautions: Back, Fall Precaution Comments: reviewed 3/3 back precautions (no bending, lifting, twisting) Required Braces or Orthoses: Spinal Brace Spinal Brace: Applied in sitting position, Lumbar corset Restrictions Weight Bearing Restrictions: No    Therapy/Group: Individual Therapy  Sallyanne Kuster, PTA, [facility] 12/15/21, 4:08 PM

## 2021-12-16 DIAGNOSIS — M48062 Spinal stenosis, lumbar region with neurogenic claudication: Secondary | ICD-10-CM | POA: Diagnosis not present

## 2021-12-16 LAB — GLUCOSE, CAPILLARY
Glucose-Capillary: 105 mg/dL — ABNORMAL HIGH (ref 70–99)
Glucose-Capillary: 125 mg/dL — ABNORMAL HIGH (ref 70–99)
Glucose-Capillary: 94 mg/dL (ref 70–99)
Glucose-Capillary: 135 mg/dL — ABNORMAL HIGH (ref 70–99)

## 2021-12-16 MED ORDER — PREGABALIN 75 MG PO CAPS
150.0000 mg | ORAL_CAPSULE | Freq: Three times a day (TID) | ORAL | Status: DC
  Administered 2021-12-16 – 2021-12-22 (×18): 150 mg via ORAL
  Filled 2021-12-16 (×18): qty 2

## 2021-12-16 MED ORDER — METHADONE HCL 10 MG PO TABS
5.0000 mg | ORAL_TABLET | Freq: Two times a day (BID) | ORAL | Status: DC
  Administered 2021-12-16 – 2021-12-25 (×19): 5 mg via ORAL
  Filled 2021-12-16 (×19): qty 1

## 2021-12-16 NOTE — Progress Notes (Signed)
PROGRESS NOTE   Subjective/Complaints:  Pt reports pain spiked again last night- always burning, "on fire". Oxycodone increase "didn't touch it".  Explained that's because the pain is nerve pain- doesn't respond to opiates/only rare ones help nerve pain.    ROS  Pt denies SOB, abd pain, CP, N/V/C/D, and vision changes Except for HPI  Objective:   No results found.  Recent Labs    12/14/21 1003 12/15/21 0518  WBC 17.8* 14.2*  HGB 13.1 11.5*  HCT 37.2* 32.4*  PLT 531* 457*   Recent Labs    12/14/21 1003 12/15/21 0518  NA 138 137  K 3.3* 3.7  CL 106 105  CO2 25 25  GLUCOSE 123* 103*  BUN 14 10  CREATININE 0.73 0.56*  CALCIUM 9.0 8.5*    Intake/Output Summary (Last 24 hours) at 12/16/2021 1354 Last data filed at 12/16/2021 0730 Gross per 24 hour  Intake 476 ml  Output 1525 ml  Net -1049 ml        Physical Exam: Vital Signs Blood pressure 121/79, pulse 76, temperature 98.2 F (36.8 C), temperature source Oral, resp. rate 16, height 5\' 6"  (1.676 m), weight 86.2 kg, SpO2 98 %.       General: awake, alert, appropriate, sitting on BSC doing grooming at sink; NAD HENT: conjugate gaze; oropharynx moist CV: regular rate; no JVD Pulmonary: CTA B/L; no W/R/R- good air movement GI: soft, NT, ND, (+)BS Psychiatric: appropriate- nears tears due to pain; frustrated Neurological: Ox3  Extremities: No clubbing, cyanosis, or edema. Pulses are 2+ Skin: incision on lower back looks great- staples intact but no drainage or redness- still good- C/D/I Psych: Pt's affect is appropriate. Pt is cooperative Musculoskeletal:     Cervical back: Neck supple. No tenderness.     Right lower leg: No edema.     Left lower leg: No edema.     Comments: UE 5/5 B/L  LE's 4/5- limited due to pain, but DF/PF also appear a little weak B/L   Skin:    General: Skin is warm and dry.     Comments: Surgical incision with staples,  dressing CDI Neurological:     General: No focal deficit present.     Mental Status: He is alert and oriented to person, place, and time.     Comments: Decreased to light touch from L knee down to toes Intact on RLE       Assessment/Plan: 1. Functional deficits which require 3+ hours per day of interdisciplinary therapy in a comprehensive inpatient rehab setting. Physiatrist is providing close team supervision and 24 hour management of active medical problems listed below. Physiatrist and rehab team continue to assess barriers to discharge/monitor patient progress toward functional and medical goals  Care Tool:  Bathing    Body parts bathed by patient: Left arm, Right arm, Chest, Abdomen, Front perineal area, Face   Body parts bathed by helper: Left upper leg, Right lower leg, Left lower leg, Right upper leg, Buttocks     Bathing assist Assist Level: Maximal Assistance - Patient 24 - 49%     Upper Body Dressing/Undressing Upper body dressing   What is the patient wearing?: Pull  over shirt    Upper body assist Assist Level: Contact Guard/Touching assist    Lower Body Dressing/Undressing Lower body dressing      What is the patient wearing?: Pants     Lower body assist Assist for lower body dressing: Moderate Assistance - Patient 50 - 74%     Toileting Toileting    Toileting assist Assist for toileting: Set up assist Assistive Device Comment: urinal   Transfers Chair/bed transfer  Transfers assist     Chair/bed transfer assist level: Contact Guard/Touching assist     Locomotion Ambulation   Ambulation assist      Assist level: Minimal Assistance - Patient > 75% Assistive device: Cane-straight (L UE on rail. R UE with SPC) Max distance: 35 feet   Walk 10 feet activity   Assist     Assist level: Minimal Assistance - Patient > 75% Assistive device: Cane-straight (L UE on rail. R UE SPC)   Walk 50 feet activity   Assist Walk 50 feet with 2  turns activity did not occur: Safety/medical concerns (unable to perform due to pain, weakness and decreased activity tolerance)         Walk 150 feet activity   Assist Walk 150 feet activity did not occur: Safety/medical concerns (unable to perform due to pain, weakness and decreased activity tolerance)         Walk 10 feet on uneven surface  activity   Assist     Assist level: Minimal Assistance - Patient > 75% Assistive device: Cane-straight (SPC and rail)   Wheelchair     Assist Is the patient using a wheelchair?: Yes Type of Wheelchair: Manual    Wheelchair assist level: Supervision/Verbal cueing Max wheelchair distance: 50 feet    Wheelchair 50 feet with 2 turns activity    Assist        Assist Level: Supervision/Verbal cueing   Wheelchair 150 feet activity     Assist  Wheelchair 150 feet activity did not occur: Safety/medical concerns (unable to perform due to pain, weakness and decreased activity tolerance)       Blood pressure 121/79, pulse 76, temperature 98.2 F (36.8 C), temperature source Oral, resp. rate 16, height 5\' 6"  (1.676 m), weight 86.2 kg, SpO2 98 %.  Medical Problem List and Plan: 1. Functional deficits secondary to lumbar stenosis s/p lumbar fusion L3-4, L4-5             -patient may  shower-cover incision             -ELOS/Goals: 7-10 days- supervision  -Continue CIR- PT, OT - nerve pain biggest limitation.  2.  Antithrombotics: -DVT/anticoagulation:  Mechanical: Sequential compression devices, below knee Bilateral lower extremities--will check dopplers due to immobility/LLE weakness.              -antiplatelet therapy: aspirin 81 mg daily 3. Pain Management: MS contin add for more consistent pain relief.  --Norco changed to oxycodone as getting close to 4 gram tylenol. 5-10 mg q4 hours prn --continue robaxin prn for muscle spasms.              -Lyrica 75 mg BID- increased as of 8/56 yo 150 mg BID             -added MS  Contin 15 mg BID and d/c'd scheduled Norco and prn Norco             -K-pad  -7/4 pain much improved with medication adjustments  8/5- will increase to  10-15 mg q4 hours prn for Oxy- made clear we don't do IV pain meds here.   8/6- change MS Contin to methadone 5 mg BID since cannot get Nucynta, etc in hospital and increased lyrica to 150 mg TID 4. Mood/Behavior/Sleep: LCSW to evaluate and provide emotional support             -antipsychotic agents: n/a 5. Neuropsych/cognition: This patient is capable of making decisions on his own behalf. 6. Skin/Wound Care: Routine skin care checks             -monitor surgical incision 7. Fluids/Electrolytes/Nutrition: Routine Is and Os and follow-up chemistries 8: S/p Lumbar fusion Dr. Otelia Sergeant 7/25. Maintain Aspen brace             -continue prednisone taper  9: Hypertension: continue Norvasc, Cozaar 10: Hyperlipidemia: continue Lipitor 11: DM-2: CBGs QID             -continue Jardiance, insulin 4 units with meals, SS  -CBGs well controlled, follow trend  8/6- CBG's looks pretty well controlled- con't regimen 12. Leucocytosis: WBC 16.1 on POD#1--likely reactive  with + steroids 7/30.   --Continue to monitor for signs of infection. CRP-0.6 (on 08/03)             --8/4 WBC higher today at 17.8, no signs of infection noted at this time, likely related to steroids, will recheck CBC tomorrow  8/5- WBC down to 14.2- con't regimen  13. Vitamin D deficiency: 06/15 labs w/Vit D-10.3. Continue Ergocalciferol weekly.  14. Hypokalemia  -KCL x2, recheck BMET tomorrow  8/5- K+ level 3.7- con't to monitor trend   I spent a total of 35   minutes on total care today- >50% coordination of care- due to prolonged d/w pt about pain mgmt- explaining that nerve pain doesn't respond to most opiates- and discussed options.    LOS: 3 days A FACE TO FACE EVALUATION WAS PERFORMED  Bo Rogue 12/16/2021, 1:54 PM

## 2021-12-16 NOTE — IPOC Note (Signed)
Overall Plan of Care [facility]) Patient Details Name: Scott Tucker MRN: [id number] DOB: 1965/11/06  Admitting Diagnosis: Lumbar stenosis  Hospital Problems: Principal Problem:   Lumbar stenosis Active Problems:   Leukocytosis   Neuropathic pain     Functional Problem List: Nursing Pain, Safety, Endurance, Sensory, Medication Management, Skin Integrity, Motor  PT Balance, Pain, Endurance, Sensory, Motor  OT Balance, Safety, Sensory, Endurance, Motor, Pain  SLP    TR         Basic ADL's: OT Bathing, Dressing, Toileting     Advanced  ADL's: OT Simple Meal Preparation, Laundry     Transfers: PT Bed Mobility, Bed to Chair, Customer service manager, Tub/Shower     Locomotion: PT Ambulation, Stairs, Wheelchair Mobility     Additional Impairments: OT None  SLP        TR      Anticipated Outcomes Item Anticipated Outcome  Self Feeding Independent  Swallowing      Basic self-care  Mod I  Toileting  Mod I   Bathroom Transfers Mod I  Bowel/Bladder  remain continent x 2 LBM 07/31  Transfers  Mod I  Locomotion  Mod I  Communication     Cognition     Pain  less than 3  Safety/Judgment  remain free from falls while in rehab   Therapy Plan: PT Intensity: Minimum of 1-2 x/day ,45 to 90 minutes PT Frequency: 5 out of 7 days PT Duration Estimated Length of Stay: 7-10 days OT Intensity: Minimum of 1-2 x/day, 45 to 90 minutes OT Frequency: 5 out of 7 days OT Duration/Estimated Length of Stay: 7-10 days     Team Interventions: Nursing Interventions Patient/Family Education, Disease Management/Prevention, Skin Care/Wound Management, Discharge Planning, Pain Management, Psychosocial Support, Medication Management  PT interventions Ambulation/gait training, Discharge planning, DME/adaptive equipment instruction, Functional mobility training, Pain management, Psychosocial support, Splinting/orthotics, Therapeutic Activities, UE/LE Strength taining/ROM, Designer, jewellery, Community reintegration, Disease management/prevention, Development worker, international aid stimulation, Neuromuscular re-education, Patient/family education, Museum/gallery curator, Therapeutic Exercise, UE/LE Coordination activities, Wheelchair propulsion/positioning  OT Interventions Warden/ranger, Discharge planning, Pain management, Self Care/advanced ADL retraining, Therapeutic Activities, UE/LE Coordination activities, Functional mobility training, Patient/family education, Therapeutic Exercise, Community reintegration, Fish farm manager, Psychosocial support, UE/LE Strength taining/ROM, Wheelchair propulsion/positioning  SLP Interventions    TR Interventions    SW/CM Interventions Discharge Planning, Psychosocial Support, Patient/Family Education   Barriers to Discharge MD  Medical stability, Home enviroment access/loayout, Wound care, Weight bearing restrictions, and pain  Nursing Decreased caregiver support, Home environment access/layout, Other (comments) (LSO brace)    PT Inaccessible home environment, Home environment access/layout, Decreased caregiver support, Lack of/limited family support lives alone in 2 story condo with 7 steps to enter without rails and 20-25 steps to access 2nd floor with bed/bath. unable to live on main level  OT Inaccessible home environment, Decreased caregiver support, Home environment access/layout stairs within the home and living alone  SLP      SW Decreased caregiver support, Insurance for SNF coverage     Team Discharge Planning: Destination: PT-Home ,OT- Home , SLP-  Projected Follow-up: PT-Outpatient PT, OT-   , SLP-  Projected Equipment Needs: PT-To be determined, OT- To be determined, SLP-  Equipment Details: PT-TBD, OT-  Patient/family involved in discharge planning: PT- Patient,  OT-Patient, SLP-   MD ELOS: 7-10 days Medical Rehab Prognosis:  Good Assessment: The patient has been admitted for CIR therapies with the  diagnosis of lumbar stenosis s/p PLIF. The team will be addressing  functional mobility, strength, stamina, balance, safety, adaptive techniques and equipment, self-care, bowel and bladder mgt, patient and caregiver education, . Goals have been set at mod I. Anticipated discharge destination is home.        See Team Conference Notes for weekly updates to the plan of care

## 2021-12-17 DIAGNOSIS — M792 Neuralgia and neuritis, unspecified: Secondary | ICD-10-CM | POA: Diagnosis not present

## 2021-12-17 DIAGNOSIS — D72829 Elevated white blood cell count, unspecified: Secondary | ICD-10-CM | POA: Diagnosis not present

## 2021-12-17 DIAGNOSIS — M48062 Spinal stenosis, lumbar region with neurogenic claudication: Secondary | ICD-10-CM | POA: Diagnosis not present

## 2021-12-17 DIAGNOSIS — E119 Type 2 diabetes mellitus without complications: Secondary | ICD-10-CM | POA: Diagnosis not present

## 2021-12-17 LAB — BASIC METABOLIC PANEL
Anion gap: 10 (ref 5–15)
BUN: 7 mg/dL (ref 6–20)
CO2: 31 mmol/L (ref 22–32)
Calcium: 9.1 mg/dL (ref 8.9–10.3)
Chloride: 100 mmol/L (ref 98–111)
Creatinine, Ser: 0.63 mg/dL (ref 0.61–1.24)
GFR, Estimated: >60 mL/min (ref >60)
Glucose, Bld: 106 mg/dL — ABNORMAL HIGH (ref 70–99)
Potassium: 3.5 mmol/L (ref 3.5–5.1)
Sodium: 141 mmol/L (ref 135–145)

## 2021-12-17 LAB — GLUCOSE, CAPILLARY
Glucose-Capillary: 87 mg/dL (ref 70–99)
Glucose-Capillary: 146 mg/dL — ABNORMAL HIGH (ref 70–99)
Glucose-Capillary: 156 mg/dL — ABNORMAL HIGH (ref 70–99)
Glucose-Capillary: 126 mg/dL — ABNORMAL HIGH (ref 70–99)

## 2021-12-17 LAB — CBC
HCT: 38.4 % — ABNORMAL LOW (ref 39.0–52.0)
Hemoglobin: 12.8 g/dL — ABNORMAL LOW (ref 13.0–17.0)
MCH: 31.6 pg (ref 26.0–34.0)
MCHC: 33.3 g/dL (ref 30.0–36.0)
MCV: 94.8 fL (ref 80.0–100.0)
Platelets: 480 10*3/uL — ABNORMAL HIGH (ref 150–400)
RBC: 4.05 MIL/uL — ABNORMAL LOW (ref 4.22–5.81)
RDW: 12.9 % (ref 11.5–15.5)
WBC: 11.6 10*3/uL — ABNORMAL HIGH (ref 4.0–10.5)
nRBC: 0 % (ref 0.0–0.2)

## 2021-12-17 MED ORDER — OXYCODONE HCL 5 MG PO TABS
10.0000 mg | ORAL_TABLET | Freq: Once | ORAL | Status: AC
  Administered 2021-12-17: 10 mg via ORAL
  Filled 2021-12-17: qty 2

## 2021-12-17 MED ORDER — DULOXETINE HCL 30 MG PO CPEP
30.0000 mg | ORAL_CAPSULE | Freq: Every day | ORAL | Status: DC
  Administered 2021-12-17 – 2021-12-24 (×8): 30 mg via ORAL
  Filled 2021-12-17 (×8): qty 1

## 2021-12-17 NOTE — Progress Notes (Signed)
Occupational Therapy Session Note  Patient Details  Name: Scott Tucker MRN: [id number] Date of Birth: 06/20/1965  Today's Date: 12/17/2021 OT Individual Time: [id number] OT Individual Time Calculation (min): 60 min    Short Term Goals: Week 1:  OT Short Term Goal 1 (Week 1): Pt will complete functional mobility for 70 ft. with LRAD with Min A for increased independence in ADLs around the house. OT Short Term Goal 2 (Week 1): Pt will complete LB with Min A using AE as needed. OT Short Term Goal 3 (Week 1): Pt will maintain dynamic standing balance with Min A while engaging in functional activity. OT Short Term Goal 4 (Week 1): Pt will recall 3/3 back precautions with min cueing for safety in ADLs.  Skilled Therapeutic Interventions/Progress Updates:    Pt received sitting up in the recliner c/o 10/10 pain in his BLE, shooting and burning. Extensive discussion re pain management and review of medications already given. Offered shower, ambulation for pain management. Encouraged pt to stand and he was agreeable. He was able to don lumbar corset seated with min A. He completed 5 ft of functional mobility with the RW with CGA overall and then requested to take a shower. He completed remainder of mobility into the shower with CGA. Min cueing to sequence transfer to TTB. Total A to doff socks. Lumbar incision reinforced with tegaderm to keep it dry. Pt completed all of shower seated on TTB to remain compliant with back precautions. LH sponge issued and used for LB bathing. Pt completed all bathing seated with (S) overall, min cueing only for LH sponge use. Good adherence to spinal precautions. He donned a gown following shower and back brace with set up assist. He used the RW to complete functional mobility back to the recliner with very slow pace d/t pain but (S) overall. Pt was left sitting up in the recliner with all needs met, chair alarm set, and call bell within reach.   Pt not controlled during  session, pt crying and moving very slowly. Only encouraged activity within pt tolerance. MD aware of pain, rounding during session.   Therapy Documentation Precautions:  Precautions Precautions: Back, Fall Precaution Comments: reviewed 3/3 back precautions (no bending, lifting, twisting) Required Braces or Orthoses: Spinal Brace Spinal Brace: Applied in sitting position, Lumbar corset Restrictions Weight Bearing Restrictions: No  Therapy/Group: Individual Therapy  Curtis Sites 12/17/2021, 6:38 AM

## 2021-12-17 NOTE — Progress Notes (Signed)
PROGRESS NOTE   Subjective/Complaints:  He continues to have burning pain in his legs. He reports pain poorly controlled with medications.    ROS  Pt denies SOB, abd pain, CP, N/V/C/D, and vision changes Except for HPI  Objective:   No results found.  Recent Labs    12/15/21 0518 12/17/21 0736  WBC 14.2* 11.6*  HGB 11.5* 12.8*  HCT 32.4* 38.4*  PLT 457* 480*    Recent Labs    12/15/21 0518 12/17/21 0736  NA 137 141  K 3.7 3.5  CL 105 100  CO2 25 31  GLUCOSE 103* 106*  BUN 10 7  CREATININE 0.56* 0.63  CALCIUM 8.5* 9.1     Intake/Output Summary (Last 24 hours) at 12/17/2021 1407 Last data filed at 12/17/2021 0130 Gross per 24 hour  Intake --  Output 1500 ml  Net -1500 ml         Physical Exam: Vital Signs Blood pressure 119/72, pulse 80, temperature 98.1 F (36.7 C), temperature source Oral, resp. rate 18, height 5\' 6"  (1.676 m), weight 86.2 kg, SpO2 97 %.       General: awake, alert, appropriate,  NAD HENT: conjugate gaze; oropharynx moist CV: regular rate; no JVD Pulmonary: CTA B/L; no W/R/R- good air movement GI: soft, NT, ND, (+)BS Psychiatric: appropriate- nears tears due to pain; frustrated Neurological: Ox3  Extremities: No clubbing, cyanosis, or edema. Pulses are 2+ Skin: incision on lower back looks great- staples intact but no drainage or redness- still good- C/D/I Psych: anxious Musculoskeletal:     Cervical back: Neck supple. No tenderness.     Right lower leg: No edema.     Left lower leg: No edema.     Comments: UE 5/5 B/L  LE's 4/5- limited due to pain, but DF/PF also appear a little weak B/L   Skin:    General: Skin is warm and dry.     Comments: Surgical incision with staples, dressing CDI Neurological:     General: No focal deficit present.     Mental Status: He is alert and oriented to person, place, and time.     Comments: Decreased to light touch from L knee  down to toes Intact on RLE       Assessment/Plan: 1. Functional deficits which require 3+ hours per day of interdisciplinary therapy in a comprehensive inpatient rehab setting. Physiatrist is providing close team supervision and 24 hour management of active medical problems listed below. Physiatrist and rehab team continue to assess barriers to discharge/monitor patient progress toward functional and medical goals  Care Tool:  Bathing    Body parts bathed by patient: Left arm, Right arm, Chest, Abdomen, Front perineal area, Face   Body parts bathed by helper: Left upper leg, Right lower leg, Left lower leg, Right upper leg, Buttocks     Bathing assist Assist Level: Maximal Assistance - Patient 24 - 49%     Upper Body Dressing/Undressing Upper body dressing   What is the patient wearing?: Pull over shirt    Upper body assist Assist Level: Contact Guard/Touching assist    Lower Body Dressing/Undressing Lower body dressing      What  is the patient wearing?: Pants     Lower body assist Assist for lower body dressing: Moderate Assistance - Patient 50 - 74%     Toileting Toileting    Toileting assist Assist for toileting: Set up assist Assistive Device Comment: urinal   Transfers Chair/bed transfer  Transfers assist     Chair/bed transfer assist level: Contact Guard/Touching assist     Locomotion Ambulation   Ambulation assist      Assist level: Contact Guard/Touching assist Assistive device: Walker-rolling Max distance: 60 feet   Walk 10 feet activity   Assist     Assist level: Contact Guard/Touching assist Assistive device: Walker-rolling   Walk 50 feet activity   Assist Walk 50 feet with 2 turns activity did not occur: Safety/medical concerns (unable to perform due to pain, weakness and decreased activity tolerance)  Assist level: Contact Guard/Touching assist Assistive device: Walker-rolling    Walk 150 feet activity   Assist Walk  150 feet activity did not occur: Safety/medical concerns (unable to perform due to pain, weakness and decreased activity tolerance)         Walk 10 feet on uneven surface  activity   Assist     Assist level: Minimal Assistance - Patient > 75% Assistive device: Cane-straight (SPC and rail)   Wheelchair     Assist Is the patient using a wheelchair?: Yes Type of Wheelchair: Manual    Wheelchair assist level: Supervision/Verbal cueing Max wheelchair distance: 50 feet    Wheelchair 50 feet with 2 turns activity    Assist        Assist Level: Supervision/Verbal cueing   Wheelchair 150 feet activity     Assist  Wheelchair 150 feet activity did not occur:  (unable to perform due to pain, weakness and decreased activity tolerance)   Assist Level: Maximal Assistance - Patient 25 - 49%   Blood pressure 119/72, pulse 80, temperature 98.1 F (36.7 C), temperature source Oral, resp. rate 18, height 5\' 6"  (1.676 m), weight 86.2 kg, SpO2 97 %.  Medical Problem List and Plan: 1. Functional deficits secondary to lumbar stenosis s/p lumbar fusion L3-4, L4-5             -patient may  shower-cover incision             -ELOS/Goals: 7-10 days- supervision  -Continue CIR- PT, OT - nerve pain biggest limitation.  2.  Antithrombotics: -DVT/anticoagulation:  Mechanical: Sequential compression devices, below knee Bilateral lower extremities--will check dopplers due to immobility/LLE weakness.              -antiplatelet therapy: aspirin 81 mg daily 3. Pain Management: MS contin add for more consistent pain relief.  --Norco changed to oxycodone as getting close to 4 gram tylenol. 5-10 mg q4 hours prn --continue robaxin prn for muscle spasms.              -Lyrica 75 mg BID- increased as of 8/56 yo 150 mg BID             -added MS Contin 15 mg BID and d/c'd scheduled Norco and prn Norco             -K-pad  -7/4 pain much improved with medication adjustments  8/5- will increase to  10-15 mg q4 hours prn for Oxy- made clear we don't do IV pain meds here.   8/6- change MS Contin to methadone 5 mg BID since cannot get Nucynta, etc in hospital and increased lyrica to 150  mg TID  8/7 additional 10mg  oxycodone this AM, will start duloxetine 30mg  daily 4. Mood/Behavior/Sleep: LCSW to evaluate and provide emotional support             -antipsychotic agents: n/a 5. Neuropsych/cognition: This patient is capable of making decisions on his own behalf. 6. Skin/Wound Care: Routine skin care checks             -monitor surgical incision 7. Fluids/Electrolytes/Nutrition: Routine Is and Os and follow-up chemistries 8: S/p Lumbar fusion Dr. 7/25. Maintain Aspen brace             -continue prednisone taper  9: Hypertension: continue Norvasc, Cozaar 10: Hyperlipidemia: continue Lipitor 11: DM-2: CBGs QID             -continue Jardiance, insulin 4 units with meals, SS  -CBGs well controlled, follow trend  8/7 CBGs well controlled, continue to monitor 12. Leucocytosis: WBC 16.1 on POD#1--likely reactive  with + steroids 7/30.   --Continue to monitor for signs of infection. CRP-0.6 (on 08/03)             --8/4 WBC higher today at 17.8, no signs of infection noted at this time, likely related to steroids, will recheck CBC tomorrow  8/7- WBC improved down to 11.6  13. Vitamin D deficiency: 06/15 labs w/Vit D-10.3. Continue Ergocalciferol weekly.  14. Hypokalemia  -KCL 10/7 x2, recheck BMET tomorrow  8/5- K+ level 3.7- con't to monitor trend   LOS: 4 days A FACE TO FACE EVALUATION WAS PERFORMED  7/15 12/17/2021, 2:07 PM

## 2021-12-17 NOTE — Progress Notes (Signed)
Physical Therapy Session Note  Patient Details  Name: Scott Tucker MRN: [id number] Date of Birth: 1966/04/21  Today's Date: 12/17/2021 PT Individual Time: 1002-1104 PT Individual Time Calculation (min): 62 min & 74 min    Short Term Goals: Week 1:  PT Short Term Goal 1 (Week 1): STG=LTG due to ELOS  Skilled Therapeutic Interventions/Progress Updates:      Therapy Documentation Precautions:  Precautions Precautions: Back, Fall Precaution Comments: reviewed 3/3 back precautions (no bending, lifting, twisting) Required Braces or Orthoses: Spinal Brace Spinal Brace: Applied in sitting position, Lumbar corset Restrictions Weight Bearing Restrictions: No  Treatment Session 1:  Pt received seated in recliner at bedside and reports sharp, shooting and burning pain to low back that radiates to testicles and bilateral LE's. Discussed pt pain with nursing and MD and pt unable to receive additional pain medications until mid-day. Pt donned lumbar brace and required supervision with sit to stand and CGA for safety with ambulation of ~60 ft with RW. Observed adequate foot clearance of L LE with gait training. Pt required seated rest break due to pain and was transported to the main gym for time management and energy conservation. Pt performed nerve glides (ankle plantarflexion and cervical flexion followed by ankle dorsiflexion and cervical extension) to bilateral LE 3 x 10. Pt largely limited due to pain in session and transported by w/c to hospital room and left seated in recliner. PT placed chair alarm pad under pt in recliner and nurse tech retrieved green box to hook up chair alarm. Pt left with all needs in reach.    Treatment Session 2:  Pt received seated in recliner at bedside and reports 10/10 pain from low back that radiates in to bilateral LE's, pt pre-medicated. Pt reports he "always" has 10/10 pain. Pt agreeable to PT session and performed stand pivot with supervision and was  transported to main gym for time management and energy conservation. Discussed potential options for stair navigation upon discharge to his condo. Pt has 7 steps to enter without rails.PT session emphasized stair and balance training to improve patient's independence with functional mobility upon discharge home. PT demonstrated backward technique with RW and pt declined strategy. Pt reports he would prefer to utilize his cane for stairs. Pt practiced stair negotiation x 8 (6 inches) with 1 HR and SPC. Pt requires verbal cues for LE placement and sequencing. Pt transitioned to 3 inch alternating step taps with unilateral support on RW (2 x 6). Pt progressed to step ups with unilateral support on RW and required min A to maintain steady. Pt maintained semi-tandem stance with eyes open/closed ( 2 x 2 min) with R LE in front and behind. Observed greater difficulty with R LE in front of L LE and pt requires min A to maintain balance. Pt transported to room and left seated in recliner at bedside with chair alarm on and all needs within reach.   Therapy/Group: Individual Therapy  Truitt Leep Truitt Leep PT, DPT  12/17/2021, 7:42 AM

## 2021-12-18 DIAGNOSIS — I10 Essential (primary) hypertension: Secondary | ICD-10-CM

## 2021-12-18 DIAGNOSIS — M48062 Spinal stenosis, lumbar region with neurogenic claudication: Secondary | ICD-10-CM | POA: Diagnosis not present

## 2021-12-18 DIAGNOSIS — E119 Type 2 diabetes mellitus without complications: Secondary | ICD-10-CM | POA: Diagnosis not present

## 2021-12-18 DIAGNOSIS — M792 Neuralgia and neuritis, unspecified: Secondary | ICD-10-CM | POA: Diagnosis not present

## 2021-12-18 LAB — GLUCOSE, CAPILLARY
Glucose-Capillary: 112 mg/dL — ABNORMAL HIGH (ref 70–99)
Glucose-Capillary: 117 mg/dL — ABNORMAL HIGH (ref 70–99)
Glucose-Capillary: 150 mg/dL — ABNORMAL HIGH (ref 70–99)
Glucose-Capillary: 111 mg/dL — ABNORMAL HIGH (ref 70–99)

## 2021-12-18 NOTE — Progress Notes (Signed)
Physical Therapy Session Note  Patient Details  Name: Scott Tucker MRN: [id number] Date of Birth: 07-04-1965  Today's Date: 12/18/2021 PT Individual Time: 0807-0910 PT Individual Time Calculation (min): 63 min   Short Term Goals: Week 1:  PT Short Term Goal 1 (Week 1): STG=LTG due to ELOS  Skilled Therapeutic Interventions/Progress Updates:  Patient supine in bed on entrance to room. Patient alert and agreeable to PT session although states that it was "hard to get up" this morning for therapy as this was the first night that he felt he got more rest than he has been getting.   Patient with continued pain complaint of n/t in BLE  as well as shooting, burning pains down BLE throughout session. Pain does not improve throughout session.   Therapeutic Activity: Bed Mobility: Patient performed supine --> sit to R side with close supervision and extra time for pain. VC for lower abdominal activation to stabilize trunk and spine prior to mobility. Transfers: Patient performed sit<>stand and stand pivot transfers from elevated bed initially and then to/ from w/c height throughout session with close supervision/ CGA. Provided verbal cues for***.  Gait Training:  Patient ambulated *** ft using *** with ***. Demonstrated ***. Provided vc/ tc for ***.  Wheelchair Mobility:  Patient propelled wheelchair *** feet with ***. Provided vc for ***.  Neuromuscular Re-ed: NMR facilitated during session with focus on***. Pt guided in ***. NMR performed for improvements in motor control and coordination, balance, sequencing, judgement, and self confidence/ efficacy in performing all aspects of mobility at highest level of independence.   Therapeutic Exercise: Patient performed the following exercises with vc/ tc for proper technique. ***  Patient ***  in *** at end of session with brakes locked, *** alarm set, and all needs within reach.   Therapy Documentation Precautions:   Precautions Precautions: Back, Fall Precaution Comments: reviewed 3/3 back precautions (no bending, lifting, twisting) Required Braces or Orthoses: Spinal Brace Spinal Brace: Applied in sitting position, Lumbar corset Restrictions Weight Bearing Restrictions: No General:   Vital Signs:   Pain:    Therapy/Group: Individual Therapy  Loel Dubonnet PT, DPT, CSRS 12/18/2021, 12:19 PM

## 2021-12-18 NOTE — Progress Notes (Signed)
Occupational Therapy Session Note  Patient Details  Name: Scott Tucker MRN: [id number] Date of Birth: [birth year]  Today's Date: 12/18/2021 OT Individual Time: 1005-1100 OT Individual Time Calculation (min): 55 min    Short Term Goals: Week 1:  OT Short Term Goal 1 (Week 1): Pt will complete functional mobility for 70 ft. with LRAD with Min A for increased independence in ADLs around the house. OT Short Term Goal 2 (Week 1): Pt will complete LB with Min A using AE as needed. OT Short Term Goal 3 (Week 1): Pt will maintain dynamic standing balance with Min A while engaging in functional activity. OT Short Term Goal 4 (Week 1): Pt will recall 3/3 back precautions with min cueing for safety in ADLs.  Skilled Therapeutic Interventions/Progress Updates:    Pt received sitting in the w/c with 9/10 pain in his B legs and back described as shooting/tingling. He was premedicated and OT suggested going outside for therapeutic benefit of fresh air/outdoors and pt was very agreeable. He was able to propel the w/c 400+ ft over several uneven terrain with (S), performed to increase BUE endurance and safety/independence with w/c mobility. Once outside he sat quietly reflecting for several minutes and reported his pain was GREATLY improved and he was comfortable for the first time in a long time. Discussed d/c planning and provided education on further pain management/return to ADL/IADls to encourage higher self efficacy/confidence. He then performed theraband circuit focused on tricep, bicep, anterior and middle deltoid strengthening. Graded activity on strain felt in back to ensure adherence to precautions. Min cueing for technique and rest breaks provided. Pt returned inside and was left sitting up with all needs met.   Therapy Documentation Precautions:  Precautions Precautions: Back, Fall Precaution Comments: reviewed 3/3 back precautions (no bending, lifting, twisting) Required Braces or Orthoses:  Spinal Brace Spinal Brace: Applied in sitting position, Lumbar corset Restrictions Weight Bearing Restrictions: No  Therapy/Group: Individual Therapy  Curtis Sites 12/18/2021, 6:11 AM

## 2021-12-18 NOTE — Progress Notes (Signed)
PROGRESS NOTE   Subjective/Complaints:  Pain is a little improved from yesterday. Worst pain continues to be burning in the legs.    ROS  Pt denies SOB, abd pain, CP, palpitations, N/V/C/D, and vision changes Except for HPI  Objective:   No results found.  Recent Labs    12/17/21 0736  WBC 11.6*  HGB 12.8*  HCT 38.4*  PLT 480*    Recent Labs    12/17/21 0736  NA 141  K 3.5  CL 100  CO2 31  GLUCOSE 106*  BUN 7  CREATININE 0.63  CALCIUM 9.1     Intake/Output Summary (Last 24 hours) at 12/18/2021 1234 Last data filed at 12/18/2021 0115 Gross per 24 hour  Intake 236 ml  Output 1875 ml  Net -1639 ml         Physical Exam: Vital Signs Blood pressure 109/63, pulse 72, temperature 98.4 F (36.9 C), temperature source Oral, resp. rate 18, height 5\' 6"  (1.676 m), weight 86.2 kg, SpO2 91 %.       General: awake, alert, appropriate,  NAD HENT: conjugate gaze; oropharynx moist CV: regular rate; no JVD Pulmonary: CTA B/L; no W/R/R- good air movement GI: soft, NT, ND, (+)BS Psychiatric: appropriate- nears tears due to pain; frustrated Neurological: Ox3  Extremities: No clubbing, cyanosis, or edema. Pulses are 2+ Skin: incision on lower back looks great- staples intact but no drainage or redness- still good- C/D/I Psych: appropriate, less anxious, pleasant Musculoskeletal:     Cervical back: Neck supple. No tenderness.     Right lower leg: No edema.     Left lower leg: No edema.     Comments: UE 5/5 B/L  LE's 4/5- limited due to pain, but DF/PF also appear a little weak B/L   Skin:    General: Skin is warm and dry.     Comments: Surgical incision with staples, dressing CDI Neurological:     General: No focal deficit present.     Mental Status: He is alert and oriented to person, place, and time.     Comments: Decreased to light touch from L knee down to toes Intact on RLE        Assessment/Plan: 1. Functional deficits which require 3+ hours per day of interdisciplinary therapy in a comprehensive inpatient rehab setting. Physiatrist is providing close team supervision and 24 hour management of active medical problems listed below. Physiatrist and rehab team continue to assess barriers to discharge/monitor patient progress toward functional and medical goals  Care Tool:  Bathing    Body parts bathed by patient: Left arm, Right arm, Chest, Abdomen, Front perineal area, Face   Body parts bathed by helper: Left upper leg, Right lower leg, Left lower leg, Right upper leg, Buttocks     Bathing assist Assist Level: Maximal Assistance - Patient 24 - 49%     Upper Body Dressing/Undressing Upper body dressing   What is the patient wearing?: Pull over shirt    Upper body assist Assist Level: Contact Guard/Touching assist    Lower Body Dressing/Undressing Lower body dressing      What is the patient wearing?: Pants     Lower body  assist Assist for lower body dressing: Moderate Assistance - Patient 50 - 74%     Toileting Toileting    Toileting assist Assist for toileting: Set up assist Assistive Device Comment: urinal   Transfers Chair/bed transfer  Transfers assist     Chair/bed transfer assist level: Contact Guard/Touching assist     Locomotion Ambulation   Ambulation assist      Assist level: Contact Guard/Touching assist Assistive device: Walker-rolling Max distance: 60 feet   Walk 10 feet activity   Assist     Assist level: Contact Guard/Touching assist Assistive device: Walker-rolling   Walk 50 feet activity   Assist Walk 50 feet with 2 turns activity did not occur: Safety/medical concerns (unable to perform due to pain, weakness and decreased activity tolerance)  Assist level: Contact Guard/Touching assist Assistive device: Walker-rolling    Walk 150 feet activity   Assist Walk 150 feet activity did not occur:  Safety/medical concerns (unable to perform due to pain, weakness and decreased activity tolerance)         Walk 10 feet on uneven surface  activity   Assist     Assist level: Minimal Assistance - Patient > 75% Assistive device: Cane-straight (SPC and rail)   Wheelchair     Assist Is the patient using a wheelchair?: Yes Type of Wheelchair: Manual    Wheelchair assist level: Supervision/Verbal cueing Max wheelchair distance: 50 feet    Wheelchair 50 feet with 2 turns activity    Assist        Assist Level: Supervision/Verbal cueing   Wheelchair 150 feet activity     Assist  Wheelchair 150 feet activity did not occur:  (unable to perform due to pain, weakness and decreased activity tolerance)   Assist Level: Maximal Assistance - Patient 25 - 49%   Blood pressure 109/63, pulse 72, temperature 98.4 F (36.9 C), temperature source Oral, resp. rate 18, height 5\' 6"  (1.676 m), weight 86.2 kg, SpO2 91 %.  Medical Problem List and Plan: 1. Functional deficits secondary to lumbar stenosis s/p lumbar fusion L3-4, L4-5             -patient may  shower-cover incision             -ELOS/Goals: 7-10 days- supervision  -Continue CIR- PT, OT - nerve pain biggest limitation, a little improved with current medications 2.  Antithrombotics: -DVT/anticoagulation:  Mechanical: Sequential compression devices, below knee Bilateral lower extremities--will check dopplers due to immobility/LLE weakness.              -antiplatelet therapy: aspirin 81 mg daily 3. Pain Management: MS contin add for more consistent pain relief.  --Norco changed to oxycodone as getting close to 4 gram tylenol. 5-10 mg q4 hours prn --continue robaxin prn for muscle spasms.              -Lyrica 75 mg BID- increased as of 8/56 yo 150 mg BID             -added MS Contin 15 mg BID and d/c'd scheduled Norco and prn Norco             -K-pad  -7/4 pain much improved with medication adjustments  8/5- will  increase to 10-15 mg q4 hours prn for Oxy- made clear we don't do IV pain meds here.   8/6- change MS Contin to methadone 5 mg BID since cannot get Nucynta, etc in hospital and increased lyrica to 150 mg TID  8/7 additional 10mg   oxycodone this AM, will start duloxetine 30mg  daily  8/8 Pain appears a overall improved from yesterday, continue current 4. Mood/Behavior/Sleep: LCSW to evaluate and provide emotional support             -antipsychotic agents: n/a 5. Neuropsych/cognition: This patient is capable of making decisions on his own behalf. 6. Skin/Wound Care: Routine skin care checks             -monitor surgical incision 7. Fluids/Electrolytes/Nutrition: Routine Is and Os and follow-up chemistries 8: S/p Lumbar fusion Dr. 7/25. Maintain Aspen brace             -continue prednisone taper  9: Hypertension: continue Norvasc, Cozaar  -8/8 well controlled 10: Hyperlipidemia: continue Lipitor 11: DM-2: CBGs QID             -continue Jardiance, insulin 4 units with meals, SS  -CBGs well controlled, follow trend  8/8 CBG with good control overall 12. Leucocytosis: WBC 16.1 on POD#1--likely reactive  with + steroids 7/30.   --Continue to monitor for signs of infection. CRP-0.6 (on 08/03)             --8/4 WBC higher today at 17.8, no signs of infection noted at this time, likely related to steroids, will recheck CBC tomorrow  8/7- WBC improved down to 11.6  13. Vitamin D deficiency: 06/15 labs w/Vit D-10.3. Continue Ergocalciferol weekly.  14. Hypokalemia  -KCL 7/15 x2, recheck BMET tomorrow  8/7 K+ 3.5 continue to monitor   LOS: 5 days A FACE TO FACE EVALUATION WAS PERFORMED  10/7 12/18/2021, 12:34 PM

## 2021-12-18 NOTE — Progress Notes (Signed)
Occupational Therapy Session Note  Patient Details  Name: Scott Tucker MRN: [id number] Date of Birth: Mar 28, 1966  Today's Date: 12/18/2021 OT Individual Time: 0017-4944 OT Individual Time Calculation (min): 84 min    Short Term Goals: Week 1:  OT Short Term Goal 1 (Week 1): Pt will complete functional mobility for 70 ft. with LRAD with Min A for increased independence in ADLs around the house. OT Short Term Goal 2 (Week 1): Pt will complete LB with Min A using AE as needed. OT Short Term Goal 3 (Week 1): Pt will maintain dynamic standing balance with Min A while engaging in functional activity. OT Short Term Goal 4 (Week 1): Pt will recall 3/3 back precautions with min cueing for safety in ADLs.  Skilled Therapeutic Interventions/Progress Updates:  Pt awake sitting in the w/c upon OT arrival to the room. Pt reports, "It just hurts so bad." Pt in agreement for OT session.   Therapy Documentation Precautions:  Precautions Precautions: Back, Fall Precaution Comments: reviewed 3/3 back precautions (no bending, lifting, twisting) Required Braces or Orthoses: Spinal Brace Spinal Brace: Applied in sitting position, Lumbar corset Restrictions Weight Bearing Restrictions: No Vital Signs: Please see "Flowsheet" for most recent vitals charted by nursing staff.  Pain: Pain Assessment Pain Scale: 0-10 Pain Score: 8  Pain Type: Acute pain Pain Location: Back Pain Orientation: Mid Pain Descriptors / Indicators: Burning;Shooting Pain Onset: On-going Pain Intervention(s): Distraction;Emotional support Multiple Pain Sites: No  ADL: Grooming: Setup (Pt able to complete oral care with set-up assist for item retrieval while seated on BSC at the sink.) Where Assessed-Grooming: Sitting at sink Upper Body Bathing: Supervision/safety (Pt able to bathe all UB parts with supervision and minimal prompts for spinal precautions during a sponge bathe seated on BSC at the sink.) Where Assessed-Upper  Body Bathing: Sitting at sink (seated on BSC at the sink) Upper Body Dressing: Setup (Pt able to doff a pull-over style shirt and don a gown with set-up assistance while seated on BSC for partial sponge bath.) Where Assessed-Upper Body Dressing:  (seated on BSC) Lower Body Dressing: Contact guard (Pt able to doff pants with CGA for standing balance while standing in front of toilet with FWW and sitting on toilet to doff over BLE. Pt does require minimal prompts to maintain spinal precautions. See "ADL comments" for further details.) Where Assessed-Lower Body Dressing:  (sitting on toilet and standing with FWW) Toileting: Contact guard (Pt able to complete clothing management with CGA for standing balance with use of grab bars ans FWW to void bladder. No hygiene required due to only voiding bladder.) Where Assessed-Toileting: Teacher, adult education: Contact guard (Pt able to complet ambulatory transfer from w/c > toilet > BSC seated at the sink with use of FWW for safety and CGA. Pt demo's slowed stepping and a step-to pattern due to increased pain in low back and BLEs.) Toilet Transfer Method: Proofreader: Grab bars ADL Comments: Pt able to complete partial sponge bath, toileting, and grooming with CGA for mobility and standing balance. However, pt able to complete seated tasks with supervision and minimal VCs to maintain spinal precautions. Pt able to recall techniques to utilize reacher and sock aid to doff and don B socks while seated at EOB. Pt able to complete task with set-up assistance. Pt able to complete various ambulatory transfers from w/c > toilet > BSC in front of sink for sponge bath & grooming tasks > EOB with close supervision - CGA with use of FWW.  Pt demo's a step-to pattern due to increased pain in BLE. Pt requires increased time to complete ADL's to allow for education on proper positioning for spinal precautions, increased pain with movement/mobility, and  increased fatigue.  Home Safety & IADLs: Pt reports difficulty with recall and problem solving. Pt's S/O entered room halfway throughout session. Pt and S/O report that pt has difficulty managing medication or recall to take medication. Pt reports potential to not transfer out of bed to take medication or complete ADL's if experiencing high levels of pain. Education provided to pt and S/O on importance of taking medication properly and how not taking medication can increase risks of readmission. Educated pt and S/O on recommendation to use a pill organizer, having S/O ensure that medication is organized appropriately, and setting alarms on pt's phone as reminders to take medication. Educated pt and S/O that OT can practice and perform medication management tasks prior to DC to improve safety and independence with proper medication management.   Pt returned to bed at end of session. Pt left resting comfortably in bed with personal belongings and call light within reach, bed alarm on and activated, bed in low position, 3 bed rails up, and comfort needs attended to. Pt requires increased time for OT to position pillows and place heating pad on RLE at end of session.   Therapy/Group: Individual Therapy  Lavera Guise 12/18/2021, 4:47 PM

## 2021-12-19 DIAGNOSIS — E119 Type 2 diabetes mellitus without complications: Secondary | ICD-10-CM | POA: Diagnosis not present

## 2021-12-19 DIAGNOSIS — M792 Neuralgia and neuritis, unspecified: Secondary | ICD-10-CM | POA: Diagnosis not present

## 2021-12-19 DIAGNOSIS — M48062 Spinal stenosis, lumbar region with neurogenic claudication: Secondary | ICD-10-CM | POA: Diagnosis not present

## 2021-12-19 DIAGNOSIS — I10 Essential (primary) hypertension: Secondary | ICD-10-CM | POA: Diagnosis not present

## 2021-12-19 LAB — GLUCOSE, CAPILLARY
Glucose-Capillary: 105 mg/dL — ABNORMAL HIGH (ref 70–99)
Glucose-Capillary: 76 mg/dL (ref 70–99)
Glucose-Capillary: 115 mg/dL — ABNORMAL HIGH (ref 70–99)
Glucose-Capillary: 144 mg/dL — ABNORMAL HIGH (ref 70–99)

## 2021-12-19 NOTE — Progress Notes (Signed)
Patient ID: Scott Tucker, male   DOB: 19-Oct-1965, 56 y.o.   MRN: [id number]  Met with pt and spoke with patrice via telephone to discuss team conference goals of supervision-mod/I level and target discharge date of 8/15. Pt continues to have pain in his legs-shooting pain that is limiting him in his therapies. MD is aware and is working on his pain management. Sharl Ma will get tub bench on her won aware not covered by insurance. Will continue to work on discharge needs, discussing home health versus OP therapies at discharge.

## 2021-12-19 NOTE — Progress Notes (Signed)
Physical Therapy Session Note  Patient Details  Name: Scott Tucker MRN: [id number] Date of Birth: 24-Jul-1965  Today's Date: 12/19/2021 PT Individual Time: 1025-8527 PT Individual Time Calculation (min): 40 min Today's Date: 12/19/2021 PT Missed Time: 45 Minutes Missed Time Reason: Pain  Short Term Goals: Week 1:  PT Short Term Goal 1 (Week 1): STG=LTG due to ELOS  Skilled Therapeutic Interventions/Progress Updates:      Therapy Documentation Precautions:  Precautions Precautions: Back, Fall Precaution Comments: reviewed 3/3 back precautions (no bending, lifting, twisting) Required Braces or Orthoses: Spinal Brace Spinal Brace: Applied in sitting position, Lumbar corset Restrictions Weight Bearing Restrictions: No   Treatment Session 1:  Pt received semi-reclined in bed sleeping and is agreeable to PT session. Pt reports 10/10 low back pain. Nursing updated and provided pain medications during session. Pt requires supervision with supine to sit with log roll strategy and adheres to spinal precautions. Pt requires supervision for static and dynamic seated balance edge of bed and performed oral hygiene. Pt requires supervision with stand pivot from bed<>chair with no AD and supervision for safety. Pt ambulated ~50 ft with RW with supervision for safety and w/c follow due to decreased activity tolerance. Pt propelled w/c 50 ft with bilateral UE and supervision to room. While mobilizing back to room in w/c pt waved and looked at MD and reported lightheadedness that quickly dissipated. Pt reports history of lightheadedness with standing up too fast and denies it with rolling in bed. Pt left seated in w/c at bedside with chair alarm on and all needs within reach.    Treatment Session 2:  Pt declined treatment session due to 10/10 back pain and missed 45 minutes of skilled PT.    Therapy/Group: Individual Therapy  Truitt Leep Truitt Leep PT, DPT  12/19/2021, 7:28 AM

## 2021-12-19 NOTE — Progress Notes (Signed)
PROGRESS NOTE   Subjective/Complaints:  Continues to have LE burning pain. Reports its overall improved compared to the weekend.  He is working with therapy, going to take a shower.     ROS  Pt denies SOB, abd pain, CP, palpitations, N/V/C/D, and vision changes Except for HPI  Objective:   No results found.  Recent Labs    12/17/21 0736  WBC 11.6*  HGB 12.8*  HCT 38.4*  PLT 480*    Recent Labs    12/17/21 0736  NA 141  K 3.5  CL 100  CO2 31  GLUCOSE 106*  BUN 7  CREATININE 0.63  CALCIUM 9.1     Intake/Output Summary (Last 24 hours) at 12/19/2021 1213 Last data filed at 12/19/2021 0720 Gross per 24 hour  Intake 360 ml  Output 1325 ml  Net -965 ml         Physical Exam: Vital Signs Blood pressure 111/70, pulse 79, temperature 98.2 F (36.8 C), resp. rate 18, height 5\' 6"  (1.676 m), weight 86.2 kg, SpO2 92 %.       General: awake, alert, appropriate,  NAD HENT: conjugate gaze; oropharynx moist CV: regular rate; no JVD Pulmonary: CTA B/L; no W/R/R- good air movement GI: soft, NT, ND, (+)BS Neurological: Ox3  Extremities: No clubbing, cyanosis, or edema. Pulses are 2+ Skin: incision on lower back looks great- staples intact but no drainage or redness- still good- C/D/I Psych: appropriate, less anxious, flat Musculoskeletal:     Cervical back: Neck supple. No tenderness.     Right lower leg: No edema.     Left lower leg: No edema.     Comments: UE 5/5 B/L  LE's 4/5- limited due to pain, but DF/PF also appear a little weak B/L   Skin:    General: Skin is warm and dry.     Comments: Surgical incision with staples, dressing CDI Neurological:     General: No focal deficit present.     Mental Status: He is alert and oriented to person, place, and time.     Comments: Decreased to light touch from L knee down to toes Intact on RLE       Assessment/Plan: 1. Functional deficits which  require 3+ hours per day of interdisciplinary therapy in a comprehensive inpatient rehab setting. Physiatrist is providing close team supervision and 24 hour management of active medical problems listed below. Physiatrist and rehab team continue to assess barriers to discharge/monitor patient progress toward functional and medical goals  Care Tool:  Bathing    Body parts bathed by patient: Left arm, Right arm, Chest, Abdomen, Front perineal area, Face   Body parts bathed by helper: Left upper leg, Right lower leg, Left lower leg, Right upper leg, Buttocks     Bathing assist Assist Level: Maximal Assistance - Patient 24 - 49%     Upper Body Dressing/Undressing Upper body dressing   What is the patient wearing?: Pull over shirt    Upper body assist Assist Level: Contact Guard/Touching assist    Lower Body Dressing/Undressing Lower body dressing      What is the patient wearing?: Pants     Lower body assist  Assist for lower body dressing: Moderate Assistance - Patient 50 - 74%     Toileting Toileting    Toileting assist Assist for toileting: Set up assist Assistive Device Comment: urinal   Transfers Chair/bed transfer  Transfers assist     Chair/bed transfer assist level: Supervision/Verbal cueing     Locomotion Ambulation   Ambulation assist      Assist level: Supervision/Verbal cueing Assistive device: Walker-rolling Max distance: 60 feet   Walk 10 feet activity   Assist     Assist level: Supervision/Verbal cueing Assistive device: Walker-rolling   Walk 50 feet activity   Assist Walk 50 feet with 2 turns activity did not occur: Safety/medical concerns (unable to perform due to pain, weakness and decreased activity tolerance)  Assist level: Contact Guard/Touching assist Assistive device: Walker-rolling    Walk 150 feet activity   Assist Walk 150 feet activity did not occur: Safety/medical concerns (unable to perform due to pain, weakness  and decreased activity tolerance)         Walk 10 feet on uneven surface  activity   Assist     Assist level: Minimal Assistance - Patient > 75% Assistive device: Cane-straight (SPC and rail)   Wheelchair     Assist Is the patient using a wheelchair?: Yes Type of Wheelchair: Manual    Wheelchair assist level: Supervision/Verbal cueing Max wheelchair distance: 50 feet    Wheelchair 50 feet with 2 turns activity    Assist        Assist Level: Supervision/Verbal cueing   Wheelchair 150 feet activity     Assist  Wheelchair 150 feet activity did not occur:  (unable to perform due to pain, weakness and decreased activity tolerance)   Assist Level: Maximal Assistance - Patient 25 - 49%   Blood pressure 111/70, pulse 79, temperature 98.2 F (36.8 C), resp. rate 18, height 5\' 6"  (1.676 m), weight 86.2 kg, SpO2 92 %.  Medical Problem List and Plan: 1. Functional deficits secondary to lumbar stenosis s/p lumbar fusion L3-4, L4-5             -patient may  shower-cover incision             -ELOS/Goals: 7-10 days- supervision  -Continue CIR- PT, OT - nerve pain biggest limitation, a little improved with current medications  -Team Conference today 2.  Antithrombotics: -DVT/anticoagulation:  Mechanical: Sequential compression devices, below knee Bilateral lower extremities--will check dopplers due to immobility/LLE weakness.              -antiplatelet therapy: aspirin 81 mg daily 3. Pain Management: MS contin add for more consistent pain relief.  --Norco changed to oxycodone as getting close to 4 gram tylenol. 5-10 mg q4 hours prn --continue robaxin prn for muscle spasms.              -Lyrica 75 mg BID- increased as of 8/56 yo 150 mg BID             -added MS Contin 15 mg BID and d/c'd scheduled Norco and prn Norco             -K-pad  -7/4 pain much improved with medication adjustments  8/5- will increase to 10-15 mg q4 hours prn for Oxy- made clear we don't do IV  pain meds here.   8/6- change MS Contin to methadone 5 mg BID since cannot get Nucynta, etc in hospital and increased lyrica to 150 mg TID  8/7 additional 10mg  oxycodone this AM,  will start duloxetine 30mg  daily  8/9 Continues to have neuropathic pain, improved from this weekend, continue current medications 4. Mood/Behavior/Sleep: LCSW to evaluate and provide emotional support             -antipsychotic agents: n/a 5. Neuropsych/cognition: This patient is capable of making decisions on his own behalf. 6. Skin/Wound Care: Routine skin care checks             -monitor surgical incision 7. Fluids/Electrolytes/Nutrition: Routine Is and Os and follow-up chemistries 8: S/p Lumbar fusion Dr. 10/9 7/25. Maintain Aspen brace             -continue prednisone taper  9: Hypertension: continue Norvasc, Cozaar  -8/9 good BP control, continue current medications 10: Hyperlipidemia: continue Lipitor 11: DM-2: CBGs QID             -continue Jardiance, insulin 4 units with meals, SS  8/9 Good control 12. Leucocytosis: WBC 16.1 on POD#1--likely reactive  with + steroids 7/30.   --Continue to monitor for signs of infection. CRP-0.6 (on 08/03)             --8/4 WBC higher today at 17.8, no signs of infection noted at this time, likely related to steroids, will recheck CBC tomorrow  8/7- WBC improved down to 11.6  13. Vitamin D deficiency: 06/15 labs w/Vit D-10.3. Continue Ergocalciferol weekly.  14. Hypokalemia  -KCL 7/15 x2, recheck BMET tomorrow  8/7 K+ 3.5 continue to monitor   LOS: 6 days A FACE TO FACE EVALUATION WAS PERFORMED  10/7 12/19/2021, 12:13 PM

## 2021-12-19 NOTE — Patient Care Conference (Signed)
Inpatient RehabilitationTeam Conference and Plan of Care Update Date: 12/19/2021   Time: 5:00 PM    Patient Name: Scott Tucker      Medical Record Number: [id number]  Date of Birth: 1965-07-13 Sex: Male         Room/Bed: 4W18C/4W18C-01 Payor Info: Payor: [employer]ICARE / Plan: HEALTHY BLUE MEDICARE / Product Type: *No Product type* /    Admit Date/Time:  12/13/2021  2:40 PM  Primary Diagnosis:  Lumbar stenosis  Hospital Problems: Principal Problem:   Lumbar stenosis Active Problems:   Leukocytosis   Neuropathic pain    Expected Discharge Date: Expected Discharge Date: 12/25/21  Team Members Present: Physician leading conference: Dr. Fanny Dance Social Worker Present: Dossie Der, LCSW Nurse Present: Other (comment) Regino Schultze, RN) PT Present: Other (comment) Truitt Leep, PT) OT Present: Jake Shark, OT PPS Coordinator present : Fae Pippin, SLP     Current Status/Progress Goal Weekly Team Focus  Bowel/Bladder   Pt is continent of B/B  remain continent  continues continent episodes   Swallow/Nutrition/ Hydration             ADL's   Limited by pain- alleviated with shower and outdoor time. (S) UB, CGA LB ADLs, CGA- (S) transfers with limited endurance  mod I overall  pain management, ADLs, transfers, functional activity tolerance   Mobility   Supervsion bed+ transfers, 60 ft RW CGA, steps x 8 min A 1 HR & SPC  mod I, supervision with stairs  trunk control, stair training, endurance with gait, pt family edu   Communication             Safety/Cognition/ Behavioral Observations            Pain   pt complains of pain 10/10 in lower back and down left leg  decrease pain score to level 5/10  assess pain q shift and PRN   Skin   skin is intact  skin integrity remains intact  assess skin q shift and PRN     Discharge Planning:  Home alone with girlfriend checking in on him, he neesds to be mod/i to be safe home alone. Pain is his  limiting factor-MD working on this   Team Discussion: Admitted with lumbar stenosis. Continent x 2, incision to lower back. Reporting significant back, BLE neuropathic pain and medications have been adjusted to address. Will d/c home alone with intermittent check from girlfriend. 7 steps at home with cane. Asking about adjustment to medication for depression Patient on target to meet rehab goals: yes  *See Care Plan and progress notes for long and short-term goals.   Revisions to Treatment Plan:  Medication for pain adjusted,   Teaching Needs: Pain management, safety, family education  Current Barriers to Discharge: Decreased caregiver support  Possible Resolutions to Barriers: Family education, pain/depression better controlled     Medical Summary Current Status: lumbar stenosis, neuropathic pain, HTN, DM2, Leukocytosis  Barriers to Discharge: Medical stability;Home enviroment access/layout  Barriers to Discharge Comments: lumbar stenosis, neuropathic pain, HTN, DM2, Leukocytosis Possible Resolutions to [employer] Focus: monitor CBC, adjust pain meds, monitor CBG and BP   Continued Need for Acute Rehabilitation Level of Care: The patient requires daily medical management by a physician with specialized training in physical medicine and rehabilitation for the following reasons: Direction of a multidisciplinary physical rehabilitation program to maximize functional independence : Yes Medical management of patient stability for increased activity during participation in an intensive rehabilitation regime.: Yes Analysis  of laboratory values and/or radiology reports with any subsequent need for medication adjustment and/or medical intervention. : Yes   I attest that I was present, lead the team conference, and concur with the assessment and plan of the team.   Bluford Kaufmann 12/19/2021, 5:00 PM

## 2021-12-19 NOTE — Progress Notes (Signed)
Occupational Therapy Session Note  Patient Details  Name: Scott Tucker MRN: [id number] Date of Birth: 1965-08-21  Today's Date: 12/19/2021 OT Individual Time: 1335-1450 OT Individual Time Calculation (min): 75 min    Short Term Goals: Week 1:  OT Short Term Goal 1 (Week 1): Pt will complete functional mobility for 70 ft. with LRAD with Min A for increased independence in ADLs around the house. OT Short Term Goal 2 (Week 1): Pt will complete LB with Min A using AE as needed. OT Short Term Goal 3 (Week 1): Pt will maintain dynamic standing balance with Min A while engaging in functional activity. OT Short Term Goal 4 (Week 1): Pt will recall 3/3 back precautions with min cueing for safety in ADLs.  Skilled Therapeutic Interventions/Progress Updates:  Pt awake in bed upon OT arrival to the room. Pt reports, "I just feels like it's hurting more today. I can feel it in my R leg too." Pt in agreement for OT session.  Therapy Documentation Precautions:  Precautions Precautions: Back, Fall Precaution Comments: reviewed 3/3 back precautions (no bending, lifting, twisting) Required Braces or Orthoses: Spinal Brace Spinal Brace: Applied in sitting position, Lumbar corset Restrictions Weight Bearing Restrictions: No Vital Signs: Please see "Flowsheet" for most recent vitals charted by nursing staff.  Pain: Pain Assessment Pain Scale: 0-10 Pain Score: 10-Worst pain ever Pain Type: Acute pain Pain Location: Back (& travels to BLE) Pain Orientation: Lower Pain Radiating Towards: legs Pain Descriptors / Indicators: Burning;Shooting Pain Onset: On-going Pain Intervention(s): Distraction;Emotional support Multiple Pain Sites: No  ADL: Grooming: Setup (Pt able to complete oral care with set-up assist for item retrieval while in the shower.) Where Assessed-Grooming: Shower Upper Body Bathing: Supervision/safety (Pt able to bathe all UB parts while seated on tub-bench while in the  shower.) Where Assessed-Upper Body Bathing: Shower Lower Body Bathing: Supervision/safety (Pt able to bathe all LB parts using LH sponge and comp techniques to bathe buttocks while seated on the tub-bench.) Where Assessed-Lower Body Bathing: Shower Upper Body Dressing:  (Pt able to doff and don a hospital down with set-up assist.) Lower Body Dressing: Setup (Pt able to doff and don B socks using reacher to doff and sock aid to don with set-up assist for item retrieval. Pt able to recall proper techniques to utilize AE to don footwear.) Where Assessed-Lower Body Dressing: Edge of bed Toileting:  (Pt declines need at this time.) Film/video editor: Contact guard (Pt able to compelte ambulatory transfer from EOB <> tub-bench in shower with CGA, increased time, and use of FWW.) Film/video editor Method: Designer, industrial/product: Transfer tub bench ADL Comments: Pt able to perform ADL's with good adherence to spinal precautions and good recall ofusing AE with ADL's in order to maintain precautions. Pt able to perform ambulatory transfers with significantly increased time, CGA, and FWW for safety. Pt did demo 1 small LOB with L knee buckling when stepping to to transfer to the tub-bench, however, pt able to recover balance with CGA and use of FWW. Pt able to perform supine <> sit transfer using log roll techniques with supervision and strong use of bed rails with BUE. Educated pt on techniques to use BLE to push up in bed instead of strong reliance of BUE pulling/pushing on bed rails due to this potentially causing increased pressure and straining on lower back. Pt verbalizes understanding. Pt requires increased time to complete ADL's and functional mobility due to significantly high pain levels.   Rehab Level  of Care & Pain Management: Education provided to pt on rehab process and required minutes. Educated pt on pain management techniques and importance of using pain management  techniques or compensatory tasks to meet required minutes within the rehab setting. Pt verbalizes understanding.   DC Planning: Education provided to pt and daughter (daughter arrived toward end of session) on family training that will occur prior to DC and OT recommendation of having supervision with bathing tasks at time of DC. OT provided education on how pt requires CGA for mobility at this time and could potentially need physical 24-hour assistance at time of DC as well. Pt and daughter verbalized understanding.   Pt returned to bed at end of session. Pt left resting comfortably in bed with personal belongings and call light within reach, bed alarm on and activated, bed in low position, 3 bed rails up, and comfort needs attended to.   Therapy/Group: Individual Therapy  Lavera Guise 12/19/2021, 4:05 PM

## 2021-12-19 NOTE — Progress Notes (Signed)
Occupational Therapy Session Note  Patient Details  Name: Scott Tucker MRN: [id number] Date of Birth: May 13, 1966  Today's Date: 12/19/2021 OT Individual Time: 8588-5027 OT Individual Time Calculation (min): 54 min    Short Term Goals: Week 1:  OT Short Term Goal 1 (Week 1): Pt will complete functional mobility for 70 ft. with LRAD with Min A for increased independence in ADLs around the house. OT Short Term Goal 2 (Week 1): Pt will complete LB with Min A using AE as needed. OT Short Term Goal 3 (Week 1): Pt will maintain dynamic standing balance with Min A while engaging in functional activity. OT Short Term Goal 4 (Week 1): Pt will recall 3/3 back precautions with min cueing for safety in ADLs.  Skilled Therapeutic Interventions/Progress Updates:    Pt received in the w/c with 9/10 pain in his legs, shooting and burning. He reports being premedicated but fatigued from back to back sessions. He declined shower or ant ADLs. He was agreeable to going outside for pain management. He was able to propel the w/c 400+ ft outside, in/out of the elevator, and over uneven terrain on concrete with (S) overall- performed to increase BUE endurance and strengthening. Discussed d/c planning, equipment needs, home accessibility, and fall prevention in length. He completed 35 ft of functional mobility over the uneven concrete with the RW with (S) overall. He had a very slow and labored pace d/t pain but overall safe and no LOB. He returned inside with OT pushing chair d/t fatigue. He completed a stand pivot transfer to the toilet with (S). He was left sitting on the toilet with all needs met, attempting BM- instructed to call NT before getting up.   Therapy Documentation Precautions:  Precautions Precautions: Back, Fall Precaution Comments: reviewed 3/3 back precautions (no bending, lifting, twisting) Required Braces or Orthoses: Spinal Brace Spinal Brace: Applied in sitting position, Lumbar  corset Restrictions Weight Bearing Restrictions: No  Therapy/Group: Individual Therapy  Curtis Sites 12/19/2021, 6:41 AM

## 2021-12-20 ENCOUNTER — Inpatient Hospital Stay (HOSPITAL_COMMUNITY): Payer: Medicare Other

## 2021-12-20 DIAGNOSIS — M4319 Spondylolisthesis, multiple sites in spine: Secondary | ICD-10-CM | POA: Diagnosis not present

## 2021-12-20 DIAGNOSIS — E119 Type 2 diabetes mellitus without complications: Secondary | ICD-10-CM | POA: Diagnosis not present

## 2021-12-20 DIAGNOSIS — Z981 Arthrodesis status: Secondary | ICD-10-CM | POA: Diagnosis not present

## 2021-12-20 DIAGNOSIS — M792 Neuralgia and neuritis, unspecified: Secondary | ICD-10-CM | POA: Diagnosis not present

## 2021-12-20 DIAGNOSIS — I10 Essential (primary) hypertension: Secondary | ICD-10-CM | POA: Diagnosis not present

## 2021-12-20 DIAGNOSIS — M4326 Fusion of spine, lumbar region: Secondary | ICD-10-CM | POA: Diagnosis not present

## 2021-12-20 DIAGNOSIS — M48062 Spinal stenosis, lumbar region with neurogenic claudication: Secondary | ICD-10-CM | POA: Diagnosis not present

## 2021-12-20 LAB — GLUCOSE, CAPILLARY
Glucose-Capillary: 104 mg/dL — ABNORMAL HIGH (ref 70–99)
Glucose-Capillary: 88 mg/dL (ref 70–99)
Glucose-Capillary: 135 mg/dL — ABNORMAL HIGH (ref 70–99)
Glucose-Capillary: 122 mg/dL — ABNORMAL HIGH (ref 70–99)

## 2021-12-20 NOTE — Progress Notes (Signed)
Physical Therapy Session Note  Patient Details  Name: Scott Tucker MRN: [id number] Date of Birth: [birth year]  Today's Date: 12/20/2021 PT Individual Time: 1103-1202 PT Individual Time Calculation (min): 59 min   Short Term Goals: Week 1:  PT Short Term Goal 1 (Week 1): STG=LTG due to ELOS  Skilled Therapeutic Interventions/Progress Updates:      Therapy Documentation Precautions:  Precautions Precautions: Back, Fall Precaution Comments: reviewed 3/3 back precautions (no bending, lifting, twisting) Required Braces or Orthoses: Spinal Brace Spinal Brace: Applied in sitting position, Lumbar corset Restrictions Weight Bearing Restrictions: No  Pt received seated in w/c at bedside and reports low back pain, pt pre-medicated. Pt transported outside [facility] in w/c for time management and energy conservation. Pt participated in gait training ~75 feet with RW on unlevel surfaces and required supervision for safety. Patient required increased time for initiation, rest breaks, and for mobility throughout session. Utilized therapeutic use of self throughout to promote efficiency. Pt mood increased with community integration based session outside. Pt transported to room in w/c and required supervision with stand pivot transfer from chair<>bed and sit to supine. Pt left semi-reclined in bed with all needs in reach and bed alarm on.     Therapy/Group: Individual Therapy  Truitt Leep 12/20/2021, 7:35 AM

## 2021-12-20 NOTE — Progress Notes (Signed)
Occupational Therapy Session Note  Patient Details  Name: Scott Tucker MRN: [id number] Date of Birth: November 24, 1965  Today's Date: 12/20/2021 OT Individual Time: 4098-1191 & 1304 - 1403 OT Individual Time Calculation (min): 78 min & 59 min   Short Term Goals: Week 1:  OT Short Term Goal 1 (Week 1): Pt will complete functional mobility for 70 ft. with LRAD with Min A for increased independence in ADLs around the house. OT Short Term Goal 2 (Week 1): Pt will complete LB with Min A using AE as needed. OT Short Term Goal 3 (Week 1): Pt will maintain dynamic standing balance with Min A while engaging in functional activity. OT Short Term Goal 4 (Week 1): Pt will recall 3/3 back precautions with min cueing for safety in ADLs.  First Session [phone] - [phone]) - Skilled Therapeutic Interventions/Progress Updates:  Pt awake seated in w/c upon OT arrival to the room. Pt reports, "The pain just makes it where I don't know what I need really." Pt in agreement for OT session.  Therapy Documentation Precautions:  Precautions Precautions: Back, Fall Precaution Comments: reviewed 3/3 back precautions (no bending, lifting, twisting) Required Braces or Orthoses: Spinal Brace Spinal Brace: Applied in sitting position, Lumbar corset Restrictions Weight Bearing Restrictions: No Vital Signs: Please see "Flowsheet" for most recent vitals charted by nursing staff.  Pain: Pain Assessment Pain Scale: 0-10 Pain Score: 10-Worst pain ever Pain Location: Leg Pain Orientation: Right;Left Pain Descriptors / Indicators: Burning;Shooting Pain Onset: On-going Pain Intervention(s): Emotional support;Distraction (Pt reports RN gave medication prior to OT session) Multiple Pain Sites: No  ADL: Grooming:  (Pt reported completing prior to OT session.) Upper Body Dressing: Setup (Pt able to don a pull-over style shirt with set-up assist for item retrieval while seated in the w/c.) Where Assessed-Upper Body Dressing:  Wheelchair Lower Body Dressing: Minimal assistance (Pt able to don shorts while seated in w/c using comp techniques to maintain spinal precautions with CGA for standing balance with use of FWW to stand and minimal assist to pull pants up over hips.) Where Assessed-Lower Body Dressing: Wheelchair (& standing with FWW) Toileting:  (Pt declines need at this time.) ADL Comments: Pt continues to demo significant pain levels in lower back and BLE which pt describes as "burning & shooting". This requires pt to have increased time to complete ADLs with various rest breaks. Pt demo's s/s of increased pain and pt became tearful various times throughout session. Pt able to complete various sit <> stand transfers from w/c with CGA for safety and use of FWW. Pt requires CGA to don LSO brace while seated and standing for proper fit with assist needed to stabilize brace in place while pt fastens.  Pain Management: Pt continues to demo significant pain levels in lower back and BLE as pt describes as more nerve sensation ("burning & shooting"). OT provides extensive empathetic listening and education for various pain management techniques during therapy. OT provides education on the role that calming techniques such as visualization prior to mobility can have with pain management. Encouraged pt to utilize multi-sensory approach by playing calming sounds on the phone (pt enjoys the sound of waves) while using visualization approaches to visualize a calming environment in relation to these sounds. OT then provided education on how utilizing music during mobility as part of calming and distraction. OT provides education on the importance of finding healthy coping techniques to assist with pain management. Pt reports that at home pt would take pain medication and intermittently  pair with ETOH. OT provided education on contraindications and safety risks with this and how this could increase risk of readmission for various reasons  (side effects, fall risks, etc.). OT provided education to pt of neuropsych service that is available during admission and provided basic education on purpose of this service/consult. However, pt reports not being interested at this time.   BUE Strengthening: Pt participates in BUE theraband exercises in order to improve BUE strength needed for functional mobility, force production with transfers, and w/c propulsion if needed. Pt able to complete 10 reps x 2of the following exercises using a moderate resistance (red) theraband: elbow flexion/extension and shoulder external rotation. Minimal shoulder exercises in order to decrease the strain on lower back. Pt reports no fatigue after exercises.  Pt returned to bed at end of session. Pt left resting comfortably in bed with personal belongings and call light within reach, bed alarm on and activated, bed in low position, 3 bed rails up, and comfort needs attended to.   --------------------------------------------------------------------------------------------------------------------------------------------------------------------------------------------------------------------------  Second Session (1304 - 1403) - Skilled Therapeutic Interventions/Progress Updates:  Pt awake in bed upon OT arrival to the room. Pt reports, "Can we take a shower and then lay down?" Pt reports high pain levels. Pt in agreement for OT session.  Therapy Documentation Pain:  12/20/21 1304  Pain Assessment  Pain Scale 0-10  Pain Score 10  Pain Type Acute pain  Pain Location Leg  Pain Orientation Right;Left  Pain Descriptors / Indicators Burning;Shooting  Pain Onset On-going  Pain Intervention(s) Emotional support;Distraction  Multiple Pain Sites No     ADL's:  12/20/21 1331  ADL  Upper Body Bathing Setup (Pt able to bathe all UB parts while seated in shower with set-up assist for bathing tools and use of hand-held shower hose.)  Where Assessed-Upper Body  Bathing Shower  Lower Body Bathing Setup (Pt able to bathe all LB parts with set-up assist for bathing tools with use of LH sponge and hand-held shower hose while seated on tub-bench using comp techniques to bathe peri-areas while remaining seated throughout task.)  Where Assessed-Lower Body Bathing Shower  Upper Body Dressing  (Pt able to doff a pull-over style shirt with modified independence while seated at EOB.)  Lower Body Dressing  (Pt requires maximal assistance to doff and don B socks during session due to time constraint. However, pt has demo'd abiltiy to don and doff socks with set-up assist using AE for LB dressing. Pt has demo'd understanding in various OT sessions.)  Toileting  (Pt declines need at this time.)  Proofreader guard (Pt able to complete ambulatory transfer from EOB <> tub-bench in walk-in shower with use of FWW, CGA for safety, and grab bars for sit <> stand portion. Pt requires increased time for transfer due to significant pain levels in BLE.)  Musician bars;Transfer tub bench  ADL Comments Pt able to compelte supine <> sit transfer with supervision for safety, strong use of bed rails to elevate/lower trunk, and increased time due to pain levels. Pt continues to demo high pain levels in BLE which pt requires significantly increased time for ambulatory transfers from EOB > tub-bench for bathing tasks. Pt able to complete sit <> stand from EOB and tub-bench and ambulatory transfers with CGA and use of FWW. Pt able to recall proper techniques to use AE and comp techniques to improve safety and independence with ADLs while maintain spinal precautions. Pt requires  increased time to perform bathing tasks due to pain levels and warm water assisting with pain in BLE/lower back. Pt allows an extended shower to assist with pain management.    Pt returned to bed at end of session. Pt left resting  comfortably in bed with personal belongings and call light within reach, bed alarm on and activated, bed in low position, 3 bed rails up, NT present in room taking vitals, S/O present in room, and comfort needs attended to.   Therapy/Group: Individual Therapy  Lavera Guise 12/20/2021, 2:07 PM

## 2021-12-20 NOTE — Progress Notes (Signed)
PROGRESS NOTE   Subjective/Complaints:  Continues to have burning pain in his Legs. Reports good sleep last night.    ROS  Pt denies SOB, abd pain, CP, palpitations, N/V/C/D, and vision changes Except for HPI  Objective:   No results found.  No results for input(s): "WBC", "HGB", "HCT", "PLT" in the last 72 hours.  No results for input(s): "NA", "K", "CL", "CO2", "GLUCOSE", "BUN", "CREATININE", "CALCIUM" in the last 72 hours.   Intake/Output Summary (Last 24 hours) at 12/20/2021 1300 Last data filed at 12/20/2021 7341 Gross per 24 hour  Intake 240 ml  Output 1200 ml  Net -960 ml         Physical Exam: Vital Signs Blood pressure 120/81, pulse 88, temperature 98.9 F (37.2 C), resp. rate 16, height 5\' 6"  (1.676 m), weight 86.2 kg, SpO2 94 %.       General: awake, alert, appropriate,  NAD HENT: conjugate gaze; oropharynx moist CV: regular rate; no JVD Pulmonary: CTA B/L; no W/R/R- good air movement GI: soft, NT, ND, (+)BS Neurological: Ox3  Extremities: No clubbing, cyanosis, or edema. Pulses are 2+ Skin: incision on lower back looks great- staples intact but no drainage or redness- still good- C/D/I Psych: appropriate, flat Musculoskeletal:     Cervical back: Neck supple. No tenderness.     Right lower leg: No edema.     Left lower leg: No edema.   Wearing TED hose    Comments: UE 5/5 B/L  LE's 4/5- limited due to pain, but DF/PF also appear a little weak B/L   Skin:    General: Skin is warm and dry.     Comments: Surgical incision with staples, dressing CDI Neurological:     General: No focal deficit present.     Mental Status: He is alert and oriented to person, place, and time.     Comments: Decreased to light touch from L knee down to toes Intact on RLE       Assessment/Plan: 1. Functional deficits which require 3+ hours per day of interdisciplinary therapy in a comprehensive inpatient  rehab setting. Physiatrist is providing close team supervision and 24 hour management of active medical problems listed below. Physiatrist and rehab team continue to assess barriers to discharge/monitor patient progress toward functional and medical goals  Care Tool:  Bathing    Body parts bathed by patient: Left arm, Right arm, Chest, Abdomen, Front perineal area, Face   Body parts bathed by helper: Left upper leg, Right lower leg, Left lower leg, Right upper leg, Buttocks     Bathing assist Assist Level: Maximal Assistance - Patient 24 - 49%     Upper Body Dressing/Undressing Upper body dressing   What is the patient wearing?: Pull over shirt    Upper body assist Assist Level: Contact Guard/Touching assist    Lower Body Dressing/Undressing Lower body dressing      What is the patient wearing?: Pants     Lower body assist Assist for lower body dressing: Moderate Assistance - Patient 50 - 74%     Toileting Toileting    Toileting assist Assist for toileting: Set up assist Assistive Device Comment: urinal  Transfers Chair/bed transfer  Transfers assist     Chair/bed transfer assist level: Supervision/Verbal cueing     Locomotion Ambulation   Ambulation assist      Assist level: Supervision/Verbal cueing Assistive device: Walker-rolling Max distance: 75 feet   Walk 10 feet activity   Assist     Assist level: Supervision/Verbal cueing Assistive device: Walker-rolling   Walk 50 feet activity   Assist Walk 50 feet with 2 turns activity did not occur: Safety/medical concerns (unable to perform due to pain, weakness and decreased activity tolerance)  Assist level: Supervision/Verbal cueing Assistive device: Walker-rolling    Walk 150 feet activity   Assist Walk 150 feet activity did not occur: Safety/medical concerns (unable to perform due to pain, weakness and decreased activity tolerance)         Walk 10 feet on uneven surface   activity   Assist     Assist level: Minimal Assistance - Patient > 75% Assistive device: Cane-straight (SPC and rail)   Wheelchair     Assist Is the patient using a wheelchair?: Yes Type of Wheelchair: Manual    Wheelchair assist level: Supervision/Verbal cueing Max wheelchair distance: 50 feet    Wheelchair 50 feet with 2 turns activity    Assist        Assist Level: Supervision/Verbal cueing   Wheelchair 150 feet activity     Assist  Wheelchair 150 feet activity did not occur:  (unable to perform due to pain, weakness and decreased activity tolerance)   Assist Level: Maximal Assistance - Patient 25 - 49%   Blood pressure 120/81, pulse 88, temperature 98.9 F (37.2 C), resp. rate 16, height 5\' 6"  (1.676 m), weight 86.2 kg, SpO2 94 %.  Medical Problem List and Plan: 1. Functional deficits secondary to lumbar stenosis s/p lumbar fusion L3-4, L4-5             -patient may  shower-cover incision             -ELOS/Goals: 8/15- supervision  -Continue CIR- PT, OT - nerve pain biggest limitation, a little improved with current medications  -Team Conference today 2.  Antithrombotics: -DVT/anticoagulation:  Mechanical: Sequential compression devices, below knee Bilateral lower extremities--will check dopplers due to immobility/LLE weakness.              -antiplatelet therapy: aspirin 81 mg daily 3. Pain Management: MS contin add for more consistent pain relief.  --Norco changed to oxycodone as getting close to 4 gram tylenol. 5-10 mg q4 hours prn --continue robaxin prn for muscle spasms.              -Lyrica 75 mg BID- increased as of 8/56 yo 150 mg BID             -added MS Contin 15 mg BID and d/c'd scheduled Norco and prn Norco             -K-pad  -7/4 pain much improved with medication adjustments  8/5- will increase to 10-15 mg q4 hours prn for Oxy- made clear we don't do IV pain meds here.   8/6- change MS Contin to methadone 5 mg BID since cannot get  Nucynta, etc in hospital and increased lyrica to 150 mg TID  8/7 Started duloxetine 30mg  daily  8/10 Continue to monitor for response to methadone, lyrica, duloxetine, check lumbar xray   4. Mood/Behavior/Sleep: LCSW to evaluate and provide emotional support             -antipsychotic  agents: n/a 5. Neuropsych/cognition: This patient is capable of making decisions on his own behalf. 6. Skin/Wound Care: Routine skin care checks             -monitor surgical incision 7. Fluids/Electrolytes/Nutrition: Routine Is and Os and follow-up chemistries 8: S/p Lumbar fusion Dr. Otelia Sergeant 7/25. Maintain Aspen brace             -continue prednisone taper  9: Hypertension: continue Norvasc, Cozaar  -8/10 good control 10: Hyperlipidemia: continue Lipitor 11: DM-2: CBGs QID             -continue Jardiance, insulin 4 units with meals, SS  8/10 well controlled, continue current 12. Leucocytosis: WBC 16.1 on POD#1--likely reactive  with + steroids 7/30.   --Continue to monitor for signs of infection. CRP-0.6 (on 08/03)             --8/4 WBC higher today at 17.8, no signs of infection noted at this time, likely related to steroids, will recheck CBC tomorrow  8/7- WBC improved down to 11.6  13. Vitamin D deficiency: 06/15 labs w/Vit D-10.3. Continue Ergocalciferol weekly.  14. Hypokalemia  -KCL x2, recheck BMET tomorrow  8/7 K+ 3.5 continue to monitor   LOS: 7 days A FACE TO FACE EVALUATION WAS PERFORMED  Fanny Dance 12/20/2021, 1:00 PM

## 2021-12-21 ENCOUNTER — Telehealth (INDEPENDENT_AMBULATORY_CARE_PROVIDER_SITE_OTHER): Payer: Self-pay | Admitting: Primary Care

## 2021-12-21 DIAGNOSIS — E119 Type 2 diabetes mellitus without complications: Secondary | ICD-10-CM | POA: Diagnosis not present

## 2021-12-21 DIAGNOSIS — M48062 Spinal stenosis, lumbar region with neurogenic claudication: Secondary | ICD-10-CM | POA: Diagnosis not present

## 2021-12-21 DIAGNOSIS — M792 Neuralgia and neuritis, unspecified: Secondary | ICD-10-CM | POA: Diagnosis not present

## 2021-12-21 DIAGNOSIS — I10 Essential (primary) hypertension: Secondary | ICD-10-CM | POA: Diagnosis not present

## 2021-12-21 LAB — GLUCOSE, CAPILLARY
Glucose-Capillary: 101 mg/dL — ABNORMAL HIGH (ref 70–99)
Glucose-Capillary: 92 mg/dL (ref 70–99)
Glucose-Capillary: 109 mg/dL — ABNORMAL HIGH (ref 70–99)

## 2021-12-21 MED ORDER — INSULIN ASPART 100 UNIT/ML IJ SOLN
2.0000 [IU] | Freq: Three times a day (TID) | INTRAMUSCULAR | Status: DC
  Administered 2021-12-21 – 2021-12-23 (×3): 2 [IU] via SUBCUTANEOUS

## 2021-12-21 NOTE — Progress Notes (Signed)
Physical Therapy Session Note  Patient Details  Name: Scott Tucker MRN: [id number] Date of Birth: 12-25-1965  Today's Date: 12/21/2021 PT Individual Time: 3154-0086 PT Individual Time Calculation (min): 28 min   Short Term Goals: Week 1:  PT Short Term Goal 1 (Week 1): STG=LTG due to ELOS  Skilled Therapeutic Interventions/Progress Updates:      Therapy Documentation Precautions:  Precautions Precautions: Back, Fall Precaution Comments: reviewed 3/3 back precautions (no bending, lifting, twisting) Required Braces or Orthoses: Spinal Brace Spinal Brace: Applied in sitting position, Lumbar corset Restrictions Weight Bearing Restrictions: No  Pt received seated in w/c at bedside and reports decreased back pain today. Pt reports ted hose are helpful and he was able to get a good night of sleep. Pt propelled w/c to main gym and session with emphasis on stair training. Pt navigated 4 steps with 1 handrail and SPC and requires verbal cueing for sequencing. Pt practiced 1 step with SPC and hand held assist from PT and required min to light mod A. Pt transported to room and left seated in w/c at bedside with chair alarm on and all needs in reach.   Therapy/Group: Individual Therapy  Truitt Leep Truitt Leep PT, DPT  12/21/2021, 7:26 AM

## 2021-12-21 NOTE — Telephone Encounter (Signed)
Spoke with patient. He would like to cancel appt until he is discharged. At that time he will call and schedule hospital follow up.

## 2021-12-21 NOTE — Progress Notes (Signed)
Occupational Therapy Session Note  Patient Details  Name: Scott Tucker MRN: [id number] Date of Birth: 25-Jul-1965  Today's Date: 12/21/2021 OT Individual Time: 0228-4069 OT Individual Time Calculation (min): 30 min    Short Term Goals: Week 2:  OT Short Term Goal 1 (Week 2): STG= LTG d/t ELOS  Skilled Therapeutic Interventions/Progress Updates:    Pt received sitting up in the w/c with c/o pain, agreeable to OT session. Session focused on kitchen education, including safety, accessibility, energy conservation strategies. Teachback with multiple different functional reaching activities. Pt uses RW to improve reach/safety and transportation of items with education on energy conservation techniques throughout activity. Also practiced without the RW to simulate home navigation and tight kitchen space, he was also able to return demo with (S). Pt returned to his room via w/c propulsion with (S). (S) stand pivot transfer back to bed. Pt was left supine with all needs met, bed alarm set.    Therapy Documentation Precautions:  Precautions Precautions: Back, Fall Precaution Comments: reviewed 3/3 back precautions (no bending, lifting, twisting) Required Braces or Orthoses: Spinal Brace Spinal Brace: Applied in sitting position, Lumbar corset Restrictions Weight Bearing Restrictions: No  Therapy/Group: Individual Therapy  Curtis Sites 12/21/2021, 12:11 PM

## 2021-12-21 NOTE — Telephone Encounter (Signed)
Copied from CRM [id number]. Topic: General - Other >> Dec 21, 2021  9:20 AM Esperanza Sheets wrote: Patient states he is currently in the hospital and his projected release date is 8-15 the same day of his next ov  Patient is requesting a cb to discuss his appt  Please fu w/ patient

## 2021-12-21 NOTE — Progress Notes (Signed)
Occupational Therapy Session Note  Patient Details  Name: Scott Tucker MRN: [id number] Date of Birth: 1965/11/29  Today's Date: 12/21/2021 OT Individual Time: 8676-1950 OT Individual Time Calculation (min): 31 min    Short Term Goals: Week 2:  OT Short Term Goal 1 (Week 2): STG= LTG d/t ELOS  Skilled Therapeutic Interventions/Progress Updates:  Pt awake in bed with RN present in room upon OT arrival to the room. Pt reports, "I slept so good last night I forgot to take those (TEDs) off." Pt in agreement for OT session.  Therapy Documentation Precautions:  Precautions Precautions: Back, Fall Precaution Comments: reviewed 3/3 back precautions (no bending, lifting, twisting) Required Braces or Orthoses: Spinal Brace Spinal Brace: Applied in sitting position, Lumbar corset Restrictions Weight Bearing Restrictions: No Vital Signs: Please see "Flowsheet" for most recent vitals charted by nursing staff.  Pain: Pain Assessment Pain Scale: 0-10 Pain Score: 9  Pain Type: Acute pain Pain Location: Leg Pain Orientation: Right;Left Pain Descriptors / Indicators: Burning;Shooting Pain Onset: On-going Pain Intervention(s): Distraction;Emotional support (RN in room providing medicatoin) Multiple Pain Sites: No  ADL: Grooming: Modified independent (Pt able to perform oral and facial hygiene with modified independence while seated in the w/c at the sink.) Where Assessed-Grooming: Sitting at sink, Wheelchair Upper Body Dressing: Setup (Pt able to don a pull-over style shirt with set-up assist for item retrieval.) Where Assessed-Upper Body Dressing: Edge of bed Lower Body Dressing: Setup (Pt able to don shorts while seated at EOB while maintaining spinal precautions. Pt able to pull shorts over buttocks using a lateral leaning method with no safety concerns observed or fall risks due to no standing performing with clothing management.) Where Assessed-Lower Body Dressing: Edge of  bed Toileting:  (Pt declines need at this time.) ADL Comments: Pt able to perform supine > sit and sit > stand with close supervision with use of FWW and bed rails. Pt able to perform ambulatory transfer from EOB > w/c with CGA and use of FWW using s step-to pattern due to LLE weakness and pain levels. Pt able to complete grooming tasks seated in w/c at the sink with set-up assist. Pt then able to ambulate from w/c to the hallway and back to w/c with CGA and using a step-to pattern.  Pain Management: Pt continues to report high pain levels. However, pt reports that TEDs hose have assistance with decreasing pain levels or comfort in legs d/t hypersensitivity to touch in BLE. Pt requests to continue to TED hose today.   D/C Planning: Education provided to pt on the concern that OT has for fall risks and recommendation of having 24-hour assistance at this time. However, OT acknowledged pt's progress, but reinforced importance of safety after DC.   Pt requested to stay in the w/c at end of session. Pt left sitting comfortably in the w/c with personal belongings and call light within reach, belt alarm placed and activated, and comfort needs attended to.   Therapy/Group: Individual Therapy  Lavera Guise 12/21/2021, 9:01 AM

## 2021-12-21 NOTE — Progress Notes (Signed)
Patient ID: Scott Tucker, male   DOB: 1965/08/11, 56 y.o.   MRN: [id number]  Met with pt to discuss discharge needs rolling walker and either HH versus OP. He feels he has a rolling walker from last year after surgery and does want HH. [facility] had a referral while he was on acute. He is fine with them. Have reached out to [facility] for [facility] regarding this referral. Work toward discharge for Tuesday.

## 2021-12-21 NOTE — Progress Notes (Signed)
Occupational Therapy Weekly Progress Note  Patient Details  Name: Scott Tucker MRN: [id number] Date of Birth: 04/26/1966  Beginning of progress report period: December 14, 2021 End of progress report period: December 21, 2021  Today's Date: 12/21/2021 OT Individual Time: 1110-1205 OT Individual Time Calculation (min): 55 min    Patient has met 4 of 4 short term goals.  Scott Tucker continues to progress in OT, currently at a (S) level overall, occasional CGA with ADLs and transfers. His pain has been more manageable and with the use of outside time and emotional support he is able to participate in the majority of session. His goals are set at a mod I level to reflect only intermittent (S) he will have at home.   Patient continues to demonstrate the following deficits: muscle weakness, decreased cardiorespiratoy endurance, and decreased standing balance, decreased postural control, and decreased balance strategies and therefore will continue to benefit from skilled OT intervention to enhance overall performance with BADL and iADL.  Patient progressing toward long term goals..  Continue plan of care.  OT Short Term Goals Week 1:  OT Short Term Goal 1 (Week 1): Pt will complete functional mobility for 70 ft. with LRAD with Min A for increased independence in ADLs around the house. OT Short Term Goal 1 - Progress (Week 1): Met OT Short Term Goal 2 (Week 1): Pt will complete LB with Min A using AE as needed. OT Short Term Goal 2 - Progress (Week 1): Met OT Short Term Goal 3 (Week 1): Pt will maintain dynamic standing balance with Min A while engaging in functional activity. OT Short Term Goal 3 - Progress (Week 1): Met OT Short Term Goal 4 (Week 1): Pt will recall 3/3 back precautions with min cueing for safety in ADLs. OT Short Term Goal 4 - Progress (Week 1): Met Week 2:  OT Short Term Goal 1 (Week 2): STG= LTG d/t ELOS  Skilled Therapeutic Interventions/Progress Updates:    Pt received sitting up  in the w/c with 9/10 pain, agreeable to OT session. He requested to address functional mobility outside. He propelled the w/c 400+ ft of w/c propulsion outside with improved endurance, speed, and efficiency of propulsion. Discussed mood, chronic pain, and self efficacy outside, encouraged pt to continue talking/sharing his feelings with chosen family and friends. He completed 40 ft of functional mobility at a very slow but stable pace d/t pain. Cueing for breathing to work through pain. Discussed IADls at home and provided recommendations for assist with moving laundry between floors, etc. He returned inside with OT assist d/t fatigue. Pt completed 2x10 dowel rod therex for BUE shoulder strengthening required for BADLs/functional transfers as follows with demo cuing and 4 # dowel rod. Shoulder flex/ext, shoulder press, Elbow flex/ext  Pt was left sitting up in the w/c with all needs met and call bell within reach.    Therapy Documentation Precautions:  Precautions Precautions: Back, Fall Precaution Comments: reviewed 3/3 back precautions (no bending, lifting, twisting) Required Braces or Orthoses: Spinal Brace Spinal Brace: Applied in sitting position, Lumbar corset Restrictions Weight Bearing Restrictions: No  Therapy/Group: Individual Therapy  Sandra H Davis 12/21/2021, 6:54 AM  

## 2021-12-21 NOTE — Progress Notes (Signed)
Physical Therapy Session Note  Patient Details  Name: Scott Tucker MRN: [id number] Date of Birth: June 02, 1965  Today's Date: 12/21/2021 PT Individual Time: 1650-1730 PT Individual Time Calculation (min): 40 min   Short Term Goals: Week 1:  PT Short Term Goal 1 (Week 1): STG=LTG due to ELOS  Skilled Therapeutic Interventions/Progress Updates:   Pt received supine in bed and agreeable to PT at bed level. Rolling R and L to allow PT to assess wound. Pt with questions about dressing. NP notified and then present to assess wound. Reports adequate healing and no change in steristrip dress needed.   Supine BLE therex: SAQ, hip abduction, ankle PF/DF, SLR. Each performed x 8-10 with max cues for full ROM. Performed at very slow speed due to pain LPB radiating in the BLE, LLE>RLE. Pt also required prolonged therapeutic rest breaks between bouts with pursed lip breathing for pain management.   Pt left supine in bed with call bell in reach and all needs met.        Therapy Documentation Precautions:  Precautions Precautions: Back, Fall Precaution Comments: reviewed 3/3 back precautions (no bending, lifting, twisting) Required Braces or Orthoses: Spinal Brace Spinal Brace: Applied in sitting position, Lumbar corset Restrictions Weight Bearing Restrictions: No    Vital Signs: Therapy Vitals Temp: 98.4 F (36.9 C) Temp Source: Oral Pulse Rate: 89 Resp: 17 BP: 117/77 Patient Position (if appropriate): Lying Oxygen Therapy SpO2: 98 % O2 Device: Room Air Pain: Pain Assessment Pain Scale: 0-10 Pain Score: 8  Pain Type: Acute pain Pain Location: Back Pain Orientation: Right;Left;Lower Pain Radiating Towards: legs Pain Descriptors / Indicators: Burning;Shooting Pain Frequency: Constant Pain Onset: On-going Patients Stated Pain Goal: 2 Pain Intervention(s): Medication (See eMAR) Multiple Pain Sites: No     Therapy/Group: Individual Therapy  Lorie Phenix 12/21/2021,  5:59 PM

## 2021-12-21 NOTE — Progress Notes (Signed)
PROGRESS NOTE   Subjective/Complaints:  Reports he had a better night last night, pain is a little improved.    ROS  Pt denies SOB, abd pain,cough, CP, palpitations, N/V/C/D, and vision changes Except for HPI  Objective:   DG Lumbar Spine 2-3 Views  Result Date: 12/20/2021 CLINICAL DATA:  Lumbar fusion EXAM: LUMBAR SPINE - 2-3 VIEW COMPARISON:  12/07/2021 FINDINGS: Bilateral pedicle screw and interbody fusion L3-4 and L4-5. Hardware in satisfactory position and unchanged from the prior study. No fracture or complication. Mild anterolisthesis L4-5 and mild retrolisthesis L5-S1 unchanged. Interbody spacer L5-S1 unchanged in position. IMPRESSION: Bilateral pedicle screw and interbody fusion L3-4 L4-5 unchanged interbody spacer L5-S1 unchanged. No acute complication. Electronically Signed   By: Marlan Palau M.D.   On: 12/20/2021 15:46    No results for input(s): "WBC", "HGB", "HCT", "PLT" in the last 72 hours.  No results for input(s): "NA", "K", "CL", "CO2", "GLUCOSE", "BUN", "CREATININE", "CALCIUM" in the last 72 hours.   Intake/Output Summary (Last 24 hours) at 12/21/2021 1406 Last data filed at 12/21/2021 1317 Gross per 24 hour  Intake 716 ml  Output 1200 ml  Net -484 ml         Physical Exam: Vital Signs Blood pressure 117/77, pulse 89, temperature 98.4 F (36.9 C), temperature source Oral, resp. rate 17, height 5\' 6"  (1.676 m), weight 82.3 kg, SpO2 98 %.       General: awake, alert, appropriate,  NAD HENT: conjugate gaze; oropharynx moist CV: regular rate; no JVD Pulmonary: CTA B/L; no W/R/R- good air movement GI: soft, NT, ND, (+)BS Neurological: Ox3, follows commands, CN 2-12 grossly intact  Extremities: No clubbing, cyanosis, or edema. Pulses are 2+ Skin: incision on lower back looks great- staples intact but no drainage or redness- still good- C/D/I Psych: appropriate, flat  Musculoskeletal:      Cervical back: Neck supple. No tenderness.     Right lower leg: No edema.     Left lower leg: No edema.   Wearing TED hose    Comments: UE 5/5 B/L  LE's 4/5- limited due to pain, but DF/PF also appear a little weak B/L   Skin:    General: Skin is warm and dry.     Comments: Surgical incision with staples, dressing CDI Neurological:     General: No focal deficit present.     Mental Status: He is alert and oriented to person, place, and time.     Comments: Decreased to light touch from L knee down to toes Intact on RLE       Assessment/Plan: 1. Functional deficits which require 3+ hours per day of interdisciplinary therapy in a comprehensive inpatient rehab setting. Physiatrist is providing close team supervision and 24 hour management of active medical problems listed below. Physiatrist and rehab team continue to assess barriers to discharge/monitor patient progress toward functional and medical goals  Care Tool:  Bathing    Body parts bathed by patient: Left arm, Right arm, Chest, Abdomen, Front perineal area, Face   Body parts bathed by helper: Left upper leg, Right lower leg, Left lower leg, Right upper leg, Buttocks     Bathing assist Assist  Level: Maximal Assistance - Patient 24 - 49%     Upper Body Dressing/Undressing Upper body dressing   What is the patient wearing?: Pull over shirt    Upper body assist Assist Level: Contact Guard/Touching assist    Lower Body Dressing/Undressing Lower body dressing      What is the patient wearing?: Pants     Lower body assist Assist for lower body dressing: Moderate Assistance - Patient 50 - 74%     Toileting Toileting    Toileting assist Assist for toileting: Set up assist Assistive Device Comment: urinal   Transfers Chair/bed transfer  Transfers assist     Chair/bed transfer assist level: Supervision/Verbal cueing     Locomotion Ambulation   Ambulation assist      Assist level: Supervision/Verbal  cueing Assistive device: Walker-rolling Max distance: 75 feet   Walk 10 feet activity   Assist     Assist level: Supervision/Verbal cueing Assistive device: Walker-rolling   Walk 50 feet activity   Assist Walk 50 feet with 2 turns activity did not occur: Safety/medical concerns (unable to perform due to pain, weakness and decreased activity tolerance)  Assist level: Supervision/Verbal cueing Assistive device: Walker-rolling    Walk 150 feet activity   Assist Walk 150 feet activity did not occur: Safety/medical concerns (unable to perform due to pain, weakness and decreased activity tolerance)         Walk 10 feet on uneven surface  activity   Assist     Assist level: Minimal Assistance - Patient > 75% Assistive device: Cane-straight (SPC and rail)   Wheelchair     Assist Is the patient using a wheelchair?: Yes Type of Wheelchair: Manual    Wheelchair assist level: Supervision/Verbal cueing Max wheelchair distance: 50 feet    Wheelchair 50 feet with 2 turns activity    Assist        Assist Level: Supervision/Verbal cueing   Wheelchair 150 feet activity     Assist  Wheelchair 150 feet activity did not occur:  (unable to perform due to pain, weakness and decreased activity tolerance)   Assist Level: Maximal Assistance - Patient 25 - 49%   Blood pressure 117/77, pulse 89, temperature 98.4 F (36.9 C), temperature source Oral, resp. rate 17, height 5\' 6"  (1.676 m), weight 82.3 kg, SpO2 98 %.  Medical Problem List and Plan: 1. Functional deficits secondary to lumbar stenosis s/p lumbar fusion L3-4, L4-5             -patient may  shower-cover incision             -ELOS/Goals: 8/15- supervision  -Continue CIR- PT, OT - nerve pain biggest limitation, a little improved with current medications  -Team Conference today 2.  Antithrombotics: -DVT/anticoagulation:  Mechanical: Sequential compression devices, below knee Bilateral lower  extremities--will check dopplers due to immobility/LLE weakness.              -antiplatelet therapy: aspirin 81 mg daily 3. Pain Management: MS contin add for more consistent pain relief.  --Norco changed to oxycodone as getting close to 4 gram tylenol. 5-10 mg q4 hours prn --continue robaxin prn for muscle spasms.              -Lyrica 75 mg BID- increased as of 8/56 yo 150 mg BID             -added MS Contin 15 mg BID and d/c'd scheduled Norco and prn Norco             -  K-pad  -7/4 pain much improved with medication adjustments  8/5- will increase to 10-15 mg q4 hours prn for Oxy- made clear we don't do IV pain meds here.   8/6- change MS Contin to methadone 5 mg BID since cannot get Nucynta, etc in hospital and increased lyrica to 150 mg TID  8/7 Started duloxetine 30mg  daily  8/10 Lumbar xray- stable fusion hardware  8/11 continue current medications and monitor response   4. Mood/Behavior/Sleep: LCSW to evaluate and provide emotional support             -antipsychotic agents: n/a 5. Neuropsych/cognition: This patient is capable of making decisions on his own behalf. 6. Skin/Wound Care: Routine skin care checks             -monitor surgical incision 7. Fluids/Electrolytes/Nutrition: Routine Is and Os and follow-up chemistries 8: S/p Lumbar fusion Dr. 10/11 7/25. Maintain Aspen brace             -continue prednisone taper - complete 9: Hypertension: continue Norvasc, Cozaar  -8/11 well controlled 10: Hyperlipidemia: continue Lipitor 11: DM-2: CBGs QID             -continue Jardiance, insulin 4 units with meals, SS  8/11 Will decreased insulin to 2 units with meals, not using insulin at home 12. Leucocytosis: WBC 16.1 on POD#1--likely reactive  with + steroids 7/30.   --Continue to monitor for signs of infection. CRP-0.6 (on 08/03)             --8/4 WBC higher today at 17.8, no signs of infection noted at this time, likely related to steroids, will recheck CBC tomorrow  8/7- WBC  improved down to 11.6  13. Vitamin D deficiency: 06/15 labs w/Vit D-10.3. Continue Ergocalciferol weekly.  14. Hypokalemia  -KCL 7/15 x2, recheck BMET tomorrow  8/7 K+ 3.5 continue to monitor   LOS: 8 days A FACE TO FACE EVALUATION WAS PERFORMED  10/7 12/21/2021, 2:06 PM

## 2021-12-22 DIAGNOSIS — M48062 Spinal stenosis, lumbar region with neurogenic claudication: Secondary | ICD-10-CM | POA: Diagnosis not present

## 2021-12-22 LAB — GLUCOSE, CAPILLARY
Glucose-Capillary: 107 mg/dL — ABNORMAL HIGH (ref 70–99)
Glucose-Capillary: 122 mg/dL — ABNORMAL HIGH (ref 70–99)
Glucose-Capillary: 111 mg/dL — ABNORMAL HIGH (ref 70–99)

## 2021-12-22 MED ORDER — PREGABALIN 75 MG PO CAPS
200.0000 mg | ORAL_CAPSULE | Freq: Three times a day (TID) | ORAL | Status: DC
  Administered 2021-12-22 – 2021-12-25 (×9): 200 mg via ORAL
  Filled 2021-12-22 (×9): qty 1

## 2021-12-22 NOTE — Progress Notes (Signed)
Occupational Therapy Session Note  Patient Details  Name: Scott Tucker MRN: [id number] Date of Birth: [birth year]  Today's Date: 12/22/2021 OT Individual Time: 7989-2119 OT Individual Time Calculation (min): 63 min    Short Term Goals: Week 2:  OT Short Term Goal 1 (Week 2): STG= LTG d/t ELOS  Skilled Therapeutic Interventions/Progress Updates:  Pt awake up in w/c upon OT arrival to the room. Pt reports, "I got up and ready for you." Pt in agreement for OT session.   Therapy Documentation Precautions:  Precautions Precautions: Back, Fall Precaution Comments: reviewed 3/3 back precautions (no bending, lifting, twisting) Required Braces or Orthoses: Spinal Brace Spinal Brace: Applied in sitting position, Lumbar corset Restrictions Weight Bearing Restrictions: No Vital Signs: Please see "Flowsheet" for most recent vitals charted by nursing staff. \ Pain: Pain Assessment Pain Scale: 0-10 Pain Score: 9  Pain Location: Leg Pain Orientation: Right;Left Pain Descriptors / Indicators: Burning;Shooting Pain Onset: On-going Pain Intervention(s): Distraction;Emotional support Multiple Pain Sites: No  ADL: Pt declines need to perform ADL's at this time due to completing prior to OT session.  BUE Strengthening: Pt participates in BUE strengthening (outside) to improve independence and safety with functional mobility and w/c propulsion. Pt able to perform 10-15 reps x2 of the following exercises using a 5# dowel rod while seated in the w/c: forward/reverse rows, elbow flexion (bicep curls), and wrist flexion/extension. Pt requires rest breaks between exercises for maximal strengthening benefits. OT encouraged pt to perform minimal shoulder exercises in order to decrease strain on lower back to assist with pain management. Pt reports minimal fatigue after completing exercises, however, no discomfort reported.   STM & Recall Skills: While completing BUE strengthening exercises, pt  challenged to recall the routine of BUE exercises from the 1st set to the 2nd set in order to improve techniques and independence with STM recall. Pt requires minimal prompt to initiate 2nd set with the 1st exercises that was completed. However, pt able to utilize motor movements to assist with proper recall for the rest of the exercises.   W/C Propulsion: Pt participates in w/c propulsion to go outside as part of improving morale, improving therapeutic rapport, and BUE endurance needed for pre-community mobility. Pt able to propel w/c from room <> outside (>200') and navigate through various turns, elevators, and doorways with fluctuations of close - distant supervision for safety. Increased supervision requires for busy environments. Pt reports only minimal fatigue with increased w/c propulsion today.   Environmental Navigation: Education provided to pt on techniques to utilize environmental cues to improve independence and safety with pre-community mobility task. Pt participates in pre-community mobility and environmental navigation task to navigate from outdoor > room after therapist had provided assistance with navigation. Pt requires minimal prompts for proper navigation back to the room. Education provided to pt on recommendation to have assistance with community mobility and navigation at this time due to [facility] deficits impacting safe navigation. Pt verbalizes understanding.   Functional Mobility: Pt requests to return to bed at end of session. Pt able to correctly orient w/c in relation to the bed to improve safety with SPT from w/c > bed with minimal assistance to management leg rests. Pt then able to complete SPT from w/c > EOB with close supervision for safety. Pt able to complete sit > supine transfer with modified independence with strong use of bed rails.    Pt returned to bed at end of session. Pt left resting comfortably in bed with personal belongings and call  light within reach, bed  alarm on and activated, bed in low position, 3 bed rails up, and comfort needs attended to.    Therapy/Group: Individual Therapy  Lavera Guise 12/22/2021, 12:43 PM

## 2021-12-22 NOTE — Progress Notes (Signed)
PROGRESS NOTE   Subjective/Complaints:  Has had a lot of burning pain in BLEs this am , L>R   ROS  Pt denies SOB, abd pain,cough, CP, palpitations, N/V/C/D, and vision changes Except for HPI  Objective:   DG Lumbar Spine 2-3 Views  Result Date: 12/20/2021 CLINICAL DATA:  Lumbar fusion EXAM: LUMBAR SPINE - 2-3 VIEW COMPARISON:  12/07/2021 FINDINGS: Bilateral pedicle screw and interbody fusion L3-4 and L4-5. Hardware in satisfactory position and unchanged from the prior study. No fracture or complication. Mild anterolisthesis L4-5 and mild retrolisthesis L5-S1 unchanged. Interbody spacer L5-S1 unchanged in position. IMPRESSION: Bilateral pedicle screw and interbody fusion L3-4 L4-5 unchanged interbody spacer L5-S1 unchanged. No acute complication. Electronically Signed   By: Marlan Palau M.D.   On: 12/20/2021 15:46    No results for input(s): "WBC", "HGB", "HCT", "PLT" in the last 72 hours.  No results for input(s): "NA", "K", "CL", "CO2", "GLUCOSE", "BUN", "CREATININE", "CALCIUM" in the last 72 hours.   Intake/Output Summary (Last 24 hours) at 12/22/2021 1117 Last data filed at 12/22/2021 0420 Gross per 24 hour  Intake --  Output 2150 ml  Net -2150 ml         Physical Exam: Vital Signs Blood pressure 123/71, pulse 75, temperature 98.2 F (36.8 C), resp. rate 14, height 5\' 6"  (1.676 m), weight 82.3 kg, SpO2 97 %.   General: No acute distress Mood and affect are appropriate Heart: Regular rate and rhythm no rubs murmurs or extra sounds Lungs: Clear to auscultation, breathing unlabored, no rales or wheezes Abdomen: Positive bowel sounds, soft nontender to palpation, nondistended Extremities: No clubbing, cyanosis, or edema Skin: No evidence of breakdown, no evidence of rash    Extremities: No clubbing, cyanosis, or edema. Pulses are 2+ Skin: incision on lower back looks great- staples intact but no drainage or  redness- still good- C/D/I Psych: appropriate, flat  Musculoskeletal:     Cervical back: Neck supple. No tenderness.     Right lower leg: No edema.     Left lower leg: No edema.   Wearing TED hose    Comments: UE 5/5 B/L  LE's 4/5- limited due to pain, but DF/PF also 4/5  Skin:    General: Skin is warm and dry.     Comments: Surgical incision with staples, dressing CDI Neurological:     General: No focal deficit present.     Mental Status: He is alert and oriented to person, place, and time.     Comments: Decreased to light touch from L knee down to toes Intact on RLE       Assessment/Plan: 1. Functional deficits which require 3+ hours per day of interdisciplinary therapy in a comprehensive inpatient rehab setting. Physiatrist is providing close team supervision and 24 hour management of active medical problems listed below. Physiatrist and rehab team continue to assess barriers to discharge/monitor patient progress toward functional and medical goals  Care Tool:  Bathing    Body parts bathed by patient: Left arm, Right arm, Chest, Abdomen, Front perineal area, Face   Body parts bathed by helper: Left upper leg, Right lower leg, Left lower leg, Right upper leg, Buttocks  Bathing assist Assist Level: Maximal Assistance - Patient 24 - 49%     Upper Body Dressing/Undressing Upper body dressing   What is the patient wearing?: Pull over shirt    Upper body assist Assist Level: Contact Guard/Touching assist    Lower Body Dressing/Undressing Lower body dressing      What is the patient wearing?: Pants     Lower body assist Assist for lower body dressing: Moderate Assistance - Patient 50 - 74%     Toileting Toileting    Toileting assist Assist for toileting: Set up assist Assistive Device Comment: urinal   Transfers Chair/bed transfer  Transfers assist     Chair/bed transfer assist level: Supervision/Verbal cueing      Locomotion Ambulation   Ambulation assist      Assist level: Supervision/Verbal cueing Assistive device: Walker-rolling Max distance: 75 feet   Walk 10 feet activity   Assist     Assist level: Supervision/Verbal cueing Assistive device: Walker-rolling   Walk 50 feet activity   Assist Walk 50 feet with 2 turns activity did not occur: Safety/medical concerns (unable to perform due to pain, weakness and decreased activity tolerance)  Assist level: Supervision/Verbal cueing Assistive device: Walker-rolling    Walk 150 feet activity   Assist Walk 150 feet activity did not occur: Safety/medical concerns (unable to perform due to pain, weakness and decreased activity tolerance)         Walk 10 feet on uneven surface  activity   Assist     Assist level: Minimal Assistance - Patient > 75% Assistive device: Cane-straight (SPC and rail)   Wheelchair     Assist Is the patient using a wheelchair?: Yes Type of Wheelchair: Manual    Wheelchair assist level: Supervision/Verbal cueing Max wheelchair distance: 50 feet    Wheelchair 50 feet with 2 turns activity    Assist        Assist Level: Supervision/Verbal cueing   Wheelchair 150 feet activity     Assist  Wheelchair 150 feet activity did not occur:  (unable to perform due to pain, weakness and decreased activity tolerance)   Assist Level: Maximal Assistance - Patient 25 - 49%   Blood pressure 123/71, pulse 75, temperature 98.2 F (36.8 C), resp. rate 14, height 5\' 6"  (1.676 m), weight 82.3 kg, SpO2 97 %.  Medical Problem List and Plan: 1. Functional deficits secondary to lumbar stenosis s/p lumbar fusion L3-4, L4-5             -patient may  shower-cover incision             -ELOS/Goals: 8/15- supervision  -Continue CIR- PT, OT - nerve pain biggest limitation, a little improved with current medications  -Team Conference today 2.  Antithrombotics: -DVT/anticoagulation:  Mechanical:  Sequential compression devices, below knee Bilateral lower extremities--will check dopplers due to immobility/LLE weakness.              -antiplatelet therapy: aspirin 81 mg daily 3. Pain Management: MS contin add for more consistent pain relief.  --Norco changed to oxycodone as getting close to 4 gram tylenol. 5-10 mg q4 hours prn --continue robaxin prn for muscle spasms.              -Lyrica 75 mg BID- increased as of 8/56 yo 150 mg BID- increased to 200mg  TID on 8/12             -added MS Contin 15 mg BID and d/c'd scheduled Norco and prn Norco             -  K-pad  -7/4 pain much improved with medication adjustments  8/5- will increase to 10-15 mg q4 hours prn for Oxy- made clear we don't do IV pain meds here.   8/6- change MS Contin to methadone 5 mg BID since cannot get Nucynta, etc in hospital and increased lyrica to 150 mg TID  8/7 Started duloxetine 30mg  daily  8/10 Lumbar xray- stable fusion hardware  8/11 continue current medications and monitor response   4. Mood/Behavior/Sleep: LCSW to evaluate and provide emotional support             -antipsychotic agents: n/a 5. Neuropsych/cognition: This patient is capable of making decisions on his own behalf. 6. Skin/Wound Care: Routine skin care checks             -monitor surgical incision 7. Fluids/Electrolytes/Nutrition: Routine Is and Os and follow-up chemistries 8: S/p Lumbar fusion Dr. 10/11 7/25. Maintain Aspen brace             -continue prednisone taper - complete 9: Hypertension: continue Norvasc, Cozaar  -8/11 well controlled 10: Hyperlipidemia: continue Lipitor 11: DM-2: CBGs QID             -continue Jardiance, insulin 4 units with meals, SS  8/11 Will decreased insulin to 2 units with meals, not using insulin at home 12. Leucocytosis: WBC 16.1 on POD#1--likely reactive  with + steroids 7/30.   --Continue to monitor for signs of infection. CRP-0.6 (on 08/03)             --8/4 WBC higher today at 17.8, no signs of infection  noted at this time, likely related to steroids, will recheck CBC tomorrow  8/7- WBC improved down to 11.6  13. Vitamin D deficiency: 06/15 labs w/Vit D-10.3. Continue Ergocalciferol weekly.  14. Hypokalemia  -KCL 7/15 x2, recheck BMET tomorrow  8/7 K+ 3.5 continue to monitor   LOS: 9 days A FACE TO FACE EVALUATION WAS PERFORMED  10/7 12/22/2021, 11:17 AM

## 2021-12-22 NOTE — Progress Notes (Signed)
Physical Therapy Session Note  Patient Details  Name: Scott Tucker MRN: [id number] Date of Birth: Jan 28, 1966  Today's Date: 12/22/2021 PT Individual Time: 1407-1506 PT Individual Time Calculation (min): 59 min   Short Term Goals: Week 1:  PT Short Term Goal 1 (Week 1): STG=LTG due to ELOS  Skilled Therapeutic Interventions/Progress Updates:    Pt received supported upright in the bed, asleep but easily awakens and agreeable to therapy session. Supine>sitting L EOB, HOB elevated and using bedrail, via logroll technique with min cuing to maintain spine precautions and only supervision.  Sitting EOB donned lumbar corset with set-up assist.   Sit<>stands using RW with CGA progressed to close supervision during session.   Gait training 75ft using RW with CGA for safety and pt demonstrating the following gait deviations with therapist providing the described cuing and facilitation for improvement: extremely slow gait pattern requiring 28min33sec to ambulate 32ft (0.064m/s gait speed indicating a severely diminished gait speed for household only) with significantly increased time spent in double limb support and very slow swing phase advancement and starting with more of a step-to pattern - therapist attempting to provide manual facilitation for forward movement of AD with cuing for a more continuous forward gait pattern with slight reciprocal stepping pattern with mild improvements noted.   Transported to/from gym in w/c for time management and energy conservation.  Stair navigation training ascending/descending 4 steps (6" height) using R HR only with HHA on opposite side to simulate internal home set-up with light min assist - step-to pattern leading with R LE on ascent and L LE on descent.   Transitioned to 4 steps x2 using SPC in R UE and L HHA to simulate home entry stairs with no HRs - continued with step-to pattern as described above - pt requires heavy min assist using this technique  primarily on ascent due to minor posterior lean.   Discussed following primary PT's recommendations for AD use at home after D/C and pt stating he doesn't want to use the RW but would prefer to use the [facility] in his home because he is concerned about RW moving over the carpet - educated pt that RW is also made to navigate over carpet, but he will require additional education on AD recommendations.  Gait training additional ~77ft using RW with CGA for safety and continued cuing as described above - +2 bringing w/c follow to allow longer distance for time management.   Transported the remainder of distance back to room and with encouragement agreeable to remain sitting up in w/c. Left with needs in reach and chair pad alarm on.  Therapy Documentation Precautions:  Precautions Precautions: Back, Fall Precaution Comments: reviewed 3/3 back precautions (no bending, lifting, twisting) Required Braces or Orthoses: Spinal Brace Spinal Brace: Applied in sitting position, Lumbar corset Restrictions Weight Bearing Restrictions: No   Pain: Reports pain rated as 10/10 stating "it's always a ten" - during session pt states this about his pain: "I don't know if it has increased, but it didn't decrease." Therapist provided distraction and emotional support for pain management - pt reports he is premedicated.    Therapy/Group: Individual Therapy  Ginny Forth , PT, DPT, NCS, CSRS 12/22/2021, 12:36 PM

## 2021-12-23 LAB — GLUCOSE, CAPILLARY
Glucose-Capillary: 101 mg/dL — ABNORMAL HIGH (ref 70–99)
Glucose-Capillary: 87 mg/dL (ref 70–99)
Glucose-Capillary: 111 mg/dL — ABNORMAL HIGH (ref 70–99)
Glucose-Capillary: 139 mg/dL — ABNORMAL HIGH (ref 70–99)

## 2021-12-23 NOTE — Progress Notes (Signed)
Physical Therapy Session Note  Patient Details  Name: Scott Tucker MRN: [id number] Date of Birth: 1965-10-04  Today's Date: 12/23/2021 PT Individual Time: 1431-1540 PT Individual Time Calculation (min): 69 min   Short Term Goals: Week 1:  PT Short Term Goal 1 (Week 1): STG=LTG due to ELOS   Skilled Therapeutic Interventions/Progress Updates:      Therapy Documentation Precautions:  Precautions Precautions: Back, Fall Precaution Comments: reviewed 3/3 back precautions (no bending, lifting, twisting) Required Braces or Orthoses: Spinal Brace Spinal Brace: Applied in sitting position, Lumbar corset Restrictions Weight Bearing Restrictions: No  Pt received semi-reclined in bed and agreeable to PT session. PT encouraged patient to reach out to family to see if they would be available to attend OT and PT sessions tomorrow for family education. Pt reports he will call them at a later time. Pt reported low back pain and PT provided rest breaks for relief. Pt independent with bed mobility and mod I with stand pivot transfer to w/c. Pt propelled w/c ~200 ft mod I to main gym for stair training. Pt able to navigate 12 steps ( 6 inches) with supervision and SPC. Pt requires verbal cues for AD management and LE sequencing. Pt transported to main gym for time management and energy conservation and is mod I with car transfer and ramp negotiation with RW. Pt reported feeling hot and started to sweat. Pt returned to room and nurse updated and vitals assessed.   Pulse: 110-117  SPO2: 95   BP: 124/84 (97) Pt independent with sit to supine and left semi-reclined in bed with all needs in reach and bed alarm on.    Therapy/Group: Individual Therapy  Truitt Leep Truitt Leep PT, DPT  12/23/2021, 7:48 AM

## 2021-12-24 ENCOUNTER — Other Ambulatory Visit (HOSPITAL_COMMUNITY): Payer: Self-pay

## 2021-12-24 DIAGNOSIS — F329 Major depressive disorder, single episode, unspecified: Secondary | ICD-10-CM | POA: Diagnosis not present

## 2021-12-24 DIAGNOSIS — E119 Type 2 diabetes mellitus without complications: Secondary | ICD-10-CM | POA: Diagnosis not present

## 2021-12-24 DIAGNOSIS — D72829 Elevated white blood cell count, unspecified: Secondary | ICD-10-CM | POA: Diagnosis not present

## 2021-12-24 DIAGNOSIS — M792 Neuralgia and neuritis, unspecified: Secondary | ICD-10-CM | POA: Diagnosis not present

## 2021-12-24 DIAGNOSIS — M48062 Spinal stenosis, lumbar region with neurogenic claudication: Secondary | ICD-10-CM | POA: Diagnosis not present

## 2021-12-24 LAB — BASIC METABOLIC PANEL
Anion gap: 7 (ref 5–15)
BUN: 9 mg/dL (ref 6–20)
CO2: 27 mmol/L (ref 22–32)
Calcium: 9 mg/dL (ref 8.9–10.3)
Chloride: 107 mmol/L (ref 98–111)
Creatinine, Ser: 0.7 mg/dL (ref 0.61–1.24)
GFR, Estimated: >60 mL/min (ref >60)
Glucose, Bld: 114 mg/dL — ABNORMAL HIGH (ref 70–99)
Potassium: 4.4 mmol/L (ref 3.5–5.1)
Sodium: 141 mmol/L (ref 135–145)

## 2021-12-24 LAB — CBC
HCT: 39.3 % (ref 39.0–52.0)
Hemoglobin: 12.9 g/dL — ABNORMAL LOW (ref 13.0–17.0)
MCH: 30.6 pg (ref 26.0–34.0)
MCHC: 32.8 g/dL (ref 30.0–36.0)
MCV: 93.3 fL (ref 80.0–100.0)
Platelets: 468 10*3/uL — ABNORMAL HIGH (ref 150–400)
RBC: 4.21 MIL/uL — ABNORMAL LOW (ref 4.22–5.81)
RDW: 12.5 % (ref 11.5–15.5)
WBC: 9.5 10*3/uL (ref 4.0–10.5)
nRBC: 0 % (ref 0.0–0.2)

## 2021-12-24 LAB — GLUCOSE, CAPILLARY
Glucose-Capillary: 151 mg/dL — ABNORMAL HIGH (ref 70–99)
Glucose-Capillary: 161 mg/dL — ABNORMAL HIGH (ref 70–99)
Glucose-Capillary: 85 mg/dL (ref 70–99)
Glucose-Capillary: 90 mg/dL (ref 70–99)

## 2021-12-24 MED ORDER — FEXOFENADINE HCL 180 MG PO TABS
180.0000 mg | ORAL_TABLET | Freq: Every day | ORAL | 0 refills | Status: DC
  Filled 2021-12-24: qty 30, 30d supply, fill #0

## 2021-12-24 MED ORDER — NON FORMULARY: 180.0000 mg | Freq: Every day | Status: DC

## 2021-12-24 MED ORDER — METHOCARBAMOL 500 MG PO TABS
500.0000 mg | ORAL_TABLET | Freq: Four times a day (QID) | ORAL | 1 refills | Status: DC | PRN
  Filled 2021-12-24: qty 120, 30d supply, fill #0

## 2021-12-24 MED ORDER — DULOXETINE HCL 30 MG PO CPEP
30.0000 mg | ORAL_CAPSULE | Freq: Once | ORAL | Status: AC
  Administered 2021-12-24: 30 mg via ORAL
  Filled 2021-12-24: qty 1

## 2021-12-24 MED ORDER — PREGABALIN 200 MG PO CAPS
200.0000 mg | ORAL_CAPSULE | Freq: Three times a day (TID) | ORAL | 0 refills | Status: DC
  Filled 2021-12-24: qty 90, 15d supply, fill #0
  Filled 2021-12-24 (×2): qty 90, 30d supply, fill #0

## 2021-12-24 MED ORDER — EMPAGLIFLOZIN 25 MG PO TABS
25.0000 mg | ORAL_TABLET | Freq: Every day | ORAL | 0 refills | Status: DC
  Filled 2021-12-24: qty 30, 30d supply, fill #0

## 2021-12-24 MED ORDER — DULOXETINE HCL 60 MG PO CPEP
60.0000 mg | ORAL_CAPSULE | Freq: Every day | ORAL | Status: DC
  Administered 2021-12-25: 60 mg via ORAL
  Filled 2021-12-24: qty 1

## 2021-12-24 MED ORDER — ERGOCALCIFEROL 1.25 MG (50000 UT) PO CAPS
50000.0000 [IU] | ORAL_CAPSULE | ORAL | 0 refills | Status: DC
  Filled 2021-12-24: qty 4, 28d supply, fill #0

## 2021-12-24 MED ORDER — AMLODIPINE BESYLATE 10 MG PO TABS
10.0000 mg | ORAL_TABLET | Freq: Every day | ORAL | 0 refills | Status: DC
  Filled 2021-12-24: qty 30, 30d supply, fill #0

## 2021-12-24 MED ORDER — CAMPHOR-MENTHOL 0.5-0.5 % EX LOTN: TOPICAL_LOTION | CUTANEOUS | Status: DC | PRN

## 2021-12-24 MED ORDER — OXYCODONE HCL 5 MG PO TABS: 10.0000 mg | ORAL_TABLET | ORAL | Status: DC | PRN

## 2021-12-24 MED ORDER — LOSARTAN POTASSIUM 25 MG PO TABS
25.0000 mg | ORAL_TABLET | Freq: Every day | ORAL | 0 refills | Status: DC
  Filled 2021-12-24: qty 30, 30d supply, fill #0

## 2021-12-24 MED ORDER — FEXOFENADINE HCL 60 MG PO TABS
180.0000 mg | ORAL_TABLET | Freq: Every day | ORAL | Status: DC
  Filled 2021-12-24: qty 3

## 2021-12-24 MED ORDER — DOCUSATE SODIUM 100 MG PO CAPS
100.0000 mg | ORAL_CAPSULE | Freq: Two times a day (BID) | ORAL | 0 refills | Status: DC
  Filled 2021-12-24: qty 60, 30d supply, fill #0

## 2021-12-24 MED ORDER — OXYCODONE HCL 15 MG PO TABS
15.0000 mg | ORAL_TABLET | Freq: Four times a day (QID) | ORAL | 0 refills | Status: DC | PRN
Start: 2021-12-24 — End: 2022-01-01
  Filled 2021-12-24: qty 28, 7d supply, fill #0

## 2021-12-24 MED ORDER — TRIAMCINOLONE ACETONIDE 0.1 % EX CREA
TOPICAL_CREAM | Freq: Three times a day (TID) | CUTANEOUS | Status: DC
  Filled 2021-12-24: qty 15

## 2021-12-24 MED ORDER — METFORMIN HCL 500 MG PO TABS
250.0000 mg | ORAL_TABLET | Freq: Every day | ORAL | Status: DC
Start: 2021-12-24 — End: 2021-12-25
  Filled 2021-12-24 (×2): qty 1

## 2021-12-24 MED ORDER — TRIAMCINOLONE ACETONIDE 0.1 % EX CREA
TOPICAL_CREAM | Freq: Three times a day (TID) | CUTANEOUS | 0 refills | Status: DC
  Filled 2021-12-24: qty 60, 20d supply, fill #0
  Filled 2022-03-20: qty 30, 30d supply, fill #1
  Filled 2022-05-14 (×2): qty 30, 10d supply, fill #1

## 2021-12-24 MED ORDER — ASPIRIN 81 MG PO TBEC
81.0000 mg | DELAYED_RELEASE_TABLET | Freq: Every day | ORAL | 12 refills | Status: AC
  Filled 2021-12-24: qty 30, 30d supply, fill #0
  Filled 2022-03-20: qty 100, 100d supply, fill #1
  Filled 2022-05-14 (×2): qty 30, 30d supply, fill #1
  Filled 2022-06-12: qty 30, 30d supply, fill #0

## 2021-12-24 MED ORDER — DULOXETINE HCL 30 MG PO CPEP
30.0000 mg | ORAL_CAPSULE | Freq: Every day | ORAL | 0 refills | Status: DC
  Filled 2021-12-24: qty 30, 30d supply, fill #0

## 2021-12-24 MED ORDER — DULOXETINE HCL 60 MG PO CPEP
60.0000 mg | ORAL_CAPSULE | Freq: Every day | ORAL | 0 refills | Status: DC
  Filled 2021-12-24: qty 30, 30d supply, fill #0

## 2021-12-24 MED ORDER — INSULIN ASPART 100 UNIT/ML IJ SOLN: 0.0000 [IU] | Freq: Every day | INTRAMUSCULAR | Status: DC

## 2021-12-24 MED ORDER — METHADONE HCL 5 MG PO TABS
5.0000 mg | ORAL_TABLET | Freq: Two times a day (BID) | ORAL | 0 refills | Status: DC
  Filled 2021-12-24: qty 14, 7d supply, fill #0

## 2021-12-24 MED ORDER — INSULIN ASPART 100 UNIT/ML IJ SOLN
0.0000 [IU] | Freq: Three times a day (TID) | INTRAMUSCULAR | Status: DC
  Administered 2021-12-24: 2 [IU] via SUBCUTANEOUS

## 2021-12-24 MED ORDER — OXYCODONE HCL 5 MG PO TABS
15.0000 mg | ORAL_TABLET | Freq: Four times a day (QID) | ORAL | Status: DC | PRN
  Administered 2021-12-24 – 2021-12-25 (×3): 15 mg via ORAL
  Filled 2021-12-24 (×3): qty 3

## 2021-12-24 MED ORDER — ATORVASTATIN CALCIUM 40 MG PO TABS
40.0000 mg | ORAL_TABLET | Freq: Every day | ORAL | 0 refills | Status: DC
  Filled 2021-12-24: qty 30, 30d supply, fill #0

## 2021-12-24 NOTE — Consult Note (Signed)
Neuropsychological Consultation   Patient:   Scott Tucker   DOB:   02/24/1966  MR Number:  [id number]  Location:  [facility] [address] CENTER [address] [id number] [place] [address] Dept: [phone] Loc: [phone]           Date of Service:   09/23/2021  Start Time:   10 AM End Time:   11 AM  Provider/Observer:  Arley Phenix, Psy.D.       Clinical Neuropsychologist       Billing Code/Service: [phone]  Chief Complaint:    Scott Tucker is a 56 year old male with past medical history including CAD, type 2 diabetes, history of COVID-19, ACDF, lumbar spondylolisthesis with stenosis and chronic low back pain for the prior year.  Patient was admitted on 12/04/2021 for fusion L3/4 and L4/5 and removal of hardware L5-S1.  Patient limited by pain and other difficulties postoperatively with significant left thigh neuropathic pain.  Therapies were consulted and patient was admitted to CIR due to functional Graciela Husbands with limitations by pain and weakness.  Reason for Service:  Patient was referred for neuropsychological consultation due to depression due to significant residual pain.  Below is the HPI for the current admission.  HPI: Scott Tucker is a 56 year old male with history of CAD, T2DM, h/o Covid 19,  ACDF, lumbar spondylolisthesis w/stenosis and chronic LBP X 1 year who was admitted on 12/04/21 for transforaminal fusion L3/4 and L4/5 with removal of hardware L5-S1 and instrumentation L3-S! With rods, screws and cages by Dr. Otelia Sergeant. Post op limited by pain, constipation w/abdominal distension, rise in WBC to 16.1 and continues to fluctuate as well as weakness with LLE with foot drop. Steroid dose pack added 07/30 and CT lumbar spine done 07/31 revealing stable hardware with improvement in thecal sac and left foraminal patency, incidental finding of right renal nephrolithiasis and no acute findings.  Lyrica titrated up  for mangement of left thigh neuropathic pain. WBC continues to fluctuate but remains afebrile and ABLA stable. Therapy has been working with patient who continues to be limited by pain and weakness LLE. CIR recommended due to functional decline.   Current Status:  Patient was awake and alert sitting up in his chair is entered the room.  Patient was oriented with good mental status.  Patient acknowledged significant pain with planned discharge tomorrow.  Patient reports that he does not expect to have a lot of help once he discharges home with minimal expectations from girlfriend and family members to help.  Patient acknowledges significant depressive symptomatology and has been taking Cymbalta for depression.  Delay that behavioral Observation: [name]  presents as a 56 y.o.-year-old Right handed African American Male who appeared his stated age. his dress was Appropriate and he was Well Groomed and his manners were Appropriate to the situation.  his participation was indicative of Appropriate and Redirectable behaviors.  There were physical disabilities noted.  he displayed an appropriate level of cooperation and motivation.     Interactions:    Active Appropriate  Attention:   abnormal and attention span appeared shorter than expected for age  Memory:   within normal limits; recent and remote memory intact  Visuo-spatial:  not examined  Speech (Volume):  low  Speech:   normal; normal  Thought Process:  Coherent and Relevant  Though Content:  WNL; not suicidal and not homicidal  Orientation:   person, place, time/date, and situation  Judgment:   Good  Planning:   Fair  Affect:    Depressed  Mood:    Dysphoric  Insight:   Good  Intelligence:   normal  Medical History:   Past Medical History:  Diagnosis Date   Anginal pain (HCC)    Anxiety    Arthritis    Chronic back pain    Coronary artery disease    Diabetes mellitus without complication (HCC)    type 2    Dyspnea    Headache    HTN (hypertension)    Neuromuscular disorder (HCC)          Patient Active Problem List   Diagnosis Date Noted   Leukocytosis    Neuropathic pain    Spondylolisthesis, lumbar region 12/04/2021    Class: Chronic   Degenerative disc disease, lumbar 12/04/2021   Lumbar stenosis 12/04/2021   Status post lumbar spinal fusion 12/04/2021   Spinal stenosis of lumbar region 05/30/2021   S/P cervical spinal fusion 12/18/2020   Chronic radicular cervical pain 05/01/2020   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  Family Med/Psych History:  Family History  Problem Relation Age of Onset   Heart disease Maternal Uncle     Impression/DX:  Scott Tucker is a 56 year old male with past medical history including CAD, type 2 diabetes, history of COVID-19, ACDF, lumbar spondylolisthesis with stenosis and chronic low back pain for the prior year.  Patient was admitted on 12/04/2021 for fusion L3/4 and L4/5 and removal of hardware L5-S1.  Patient limited by pain and other difficulties postoperatively with significant left thigh neuropathic pain.  Therapies were consulted and patient was admitted to CIR due to functional Graciela Husbands with limitations by pain and weakness.   Current Status:  Patient was awake and alert sitting up in his chair is entered the room.  Patient was oriented with good mental  status.  Patient acknowledged significant pain with planned discharge tomorrow.  Patient reports that he does not expect to have a lot of help once he discharges home with minimal expectations from girlfriend and family members to help.  Patient acknowledges significant depressive symptomatology and has been taking Cymbalta for depression.  Disposition/Plan:  Today we worked on coping and adjustment issues regarding his chronic pain that is worsening since most recent surgery and a long history of chronic pain and multiple back surgeries prior.          Electronically Signed   _______________________ Arley Phenix, Psy.D. Clinical Neuropsychologist

## 2021-12-24 NOTE — Progress Notes (Signed)
Occupational Therapy Discharge Summary  Patient Details  Name: Scott Tucker MRN: [id number] Date of Birth: 1965/12/24  Date of Discharge from OT service:December 24, 2021  Today's Date: 12/24/2021 OT Individual Time: 1350-1455 OT Individual Time Calculation (min): 65 min    Patient has met 5 of 5 long term goals due to improved activity tolerance, improved balance, postural control, ability to compensate for deficits, and improved coordination.  Patient to discharge at overall Modified Independent level.  Patient's care partner is independent to provide the necessary intermittent physical assistance at discharge. Scott Tucker has made great progress in CIR despite his constant battle with back pain. He is able to make appropriate compensations and safely complete his ADLs at a mod I level. He has been given the recommendation to perform as little iADLs as possible when his girlfriend is not home and to stay on one level of his home. He is currently on the waitlist for an independent living facility and OT agrees with this being a safe plan long term d/t his ongoing pain and frequent falls.    Recommendation:  Patient will benefit from ongoing skilled OT services in outpatient setting to continue to advance functional skills in the area of BADL and iADL.  Equipment: No equipment provided as pt's SO is privately purchasing a recommended TTB.   Reasons for discharge: treatment goals met and discharge from hospital  Patient/family agrees with progress made and goals achieved: Yes   Skilled OT Intervention  Family education session completed with pt and his girlfriend Runner, broadcasting/film/video. Verbal education provided re fall risk reduction, energy conservation strategies, home carryover of transfer training, ADLs, and IADLs. Demonstration and hands on training completed for pt performance of UB/LB bathing and dressing at mod I level, toileting hygiene and transfers, and shower transfers. Provided clear  recommendations for when pt is mod I (ADLs) and when pt requires supervision (IADLs, stairs). Provided recommendation for TTB again, as well as purchase of a second RW to leave one upstairs and one downstairs. Reviewed equipment in room and adjusted RW to correct height. Pt was able to complete ambulatory transfers around room at mod I level and transfer into shower. Bathing completed at mod I level, except for pt asking his girlfriend to wash his back d/t not wanting to use the LH sponge anymore d/t fear of it causing him a rash. Pt dressed following the shower with mod I overall, including lumbar corset brace. Provided pt with TTB handout. Pt left sitting EOB with all needs met, [name] present.    OT Discharge Precautions/Restrictions  Precautions Precautions: Back;Fall Required Braces or Orthoses: Spinal Brace Spinal Brace: Applied in sitting position;Lumbar corset Restrictions Weight Bearing Restrictions: No   ADL ADL Eating: Independent Where Assessed-Eating: Wheelchair Grooming: Modified independent Where Assessed-Grooming: Sitting at sink, Wheelchair Upper Body Bathing: Modified independent Where Assessed-Upper Body Bathing: Shower Lower Body Bathing: Modified independent Where Assessed-Lower Body Bathing: Shower Upper Body Dressing: Modified independent (Device) Where Assessed-Upper Body Dressing: Wheelchair Lower Body Dressing: Modified independent Where Assessed-Lower Body Dressing: Wheelchair Toileting: Modified independent Where Assessed-Toileting: Glass blower/designer: Modified Programmer, applications Method: Arts development officer: Ambulance person Transfer: Distant supervision Tub/Shower Transfer Method: Optometrist: Facilities manager: Contact guard (Pt able to complete ambulatory transfer from EOB <> tub-bench in walk-in shower with use of FWW, CGA for safety, and grab bars for sit <> stand  portion. Pt requires increased time for transfer due to significant pain levels in BLE.)  Walk-In Banker Method: Heritage manager: Grab bars, Transfer tub bench ADL Comments: Pt able to perform supine > sit and sit > stand with close supervision with use of FWW and bed rails. Pt able to perform ambulatory transfer from EOB > w/c with CGA and use of FWW using s step-to pattern due to LLE weakness and pain levels. Pt able to complete grooming tasks seated in w/c at the sink with set-up assist. Pt then able to ambulate from w/c to the hallway and back to w/c with CGA and using a step-to pattern. Vision Baseline Vision/History: 0 No visual deficits Patient Visual Report: No change from baseline Vision Assessment?: No apparent visual deficits Perception  Perception: Within Functional Limits Praxis Praxis: Intact Cognition Cognition Overall Cognitive Status: Within Functional Limits for tasks assessed Arousal/Alertness: Awake/alert Orientation Level: Person;Place;Situation Person: Oriented Place: Oriented Situation: Oriented Memory: Appears intact Attention: Alternating Awareness: Appears intact Problem Solving: Appears intact Safety/Judgment: Appears intact Comments: Some overestimation of abilities Brief Interview for Mental Status (BIMS) Repetition of Three Words (First Attempt): 3 Temporal Orientation: Year: Correct Temporal Orientation: Month: Accurate within 5 days Temporal Orientation: Day: Correct Recall: "Sock": Yes, no cue required Recall: "Blue": Yes, no cue required Recall: "Bed": Yes, no cue required BIMS Summary Score: 15 Sensation Sensation Light Touch: Impaired Detail Light Touch Impaired Details: Impaired LLE;Impaired RLE Hot/Cold: Appears Intact Additional Comments: BLE nerve pain bilaterally, reports near constant shooting and burning pain Coordination Gross Motor Movements are Fluid and Coordinated: No Fine Motor Movements are Fluid  and Coordinated: Yes Coordination and Movement Description: grossly impaired due to low back pain and pain Finger Nose Finger Test: intact Motor  Motor Motor: Other (comment) Motor - Discharge Observations: grossly impaired d/t low back pain and coordination/balance deficits Mobility  Bed Mobility Bed Mobility: Rolling Left;Supine to Sit;Sitting - Scoot to [employer] of Bed;Right Sidelying to Sit Rolling Left: Independent Right Sidelying to Sit: Independent Supine to Sit: Independent Sitting - Scoot to Edge of Bed: Independent Sit to Supine: Independent Sit to Sidelying Right: Independent Transfers Sit to Stand: Independent with assistive device Stand to Sit: Independent with assistive device  Trunk/Postural Assessment  Cervical Assessment Cervical Assessment: Within Functional Limits Thoracic Assessment Thoracic Assessment: Exceptions to [facility] (rounded shoulders, pain guarding) Lumbar Assessment Lumbar Assessment: Within Functional Limits Postural Control Postural Control: Deficits on evaluation Postural Limitations: limited d/t chronic and acute back pain  Balance Balance Balance Assessed: Yes Static Sitting Balance Static Sitting - Balance Support: Feet supported Static Sitting - Level of Assistance: 7: Independent Dynamic Sitting Balance Dynamic Sitting - Balance Support: Feet supported Dynamic Sitting - Level of Assistance: 7: Independent Static Standing Balance Static Standing - Balance Support: Bilateral upper extremity supported Static Standing - Level of Assistance: 6: Modified independent (Device/Increase time) Dynamic Standing Balance Dynamic Standing - Balance Support: Bilateral upper extremity supported Dynamic Standing - Level of Assistance: 6: Modified independent (Device/Increase time) Extremity/Trunk Assessment RUE Assessment RUE Assessment: Within Functional Limits General Strength Comments: Reports some baseline limitations in his R shoulder from previous  injury LUE Assessment LUE Assessment: Within Functional Limits   Scott Tucker Sites 12/24/2021, 8:20 AM

## 2021-12-24 NOTE — Progress Notes (Signed)
Patient refused metformin states spoke with PCP and told them that he doesn't want to be on it. Notified PA

## 2021-12-24 NOTE — Progress Notes (Addendum)
Patient ID: Scott Tucker, male   DOB: 1965/12/25, 56 y.o.   MRN: [id number]  PT recommending a wheelchair pt reports he has a walker so will cancel order for walker and have ordered a wheelchair via [employer]. Preparing for discharge tomorrow.  10:10 AM [name] will be here at 11:00 to go through therapies with pt in preparation for discharge. She will get a rolling walker he does not have one, according to her. See her when here  11:08 AM [name]-girlfriend here for PT education and wheelchair has been delivered to room. She has gotten a rolling walker for him for home. Aware discharge tomorrow.

## 2021-12-24 NOTE — Progress Notes (Signed)
Occupational Therapy Session Note  Patient Details  Name: Scott Tucker MRN: [id number] Date of Birth: 03-14-1966  Today's Date: 12/24/2021 OT Individual Time: 4098-1191 OT Individual Time Calculation (min): 75 min    Short Term Goals: Week 2:  OT Short Term Goal 1 (Week 2): STG= LTG d/t ELOS  Skilled Therapeutic Interventions/Progress Updates:    Pt received in wc with 9/10 pain at rest, premedicated and agreeable to OT session. He completed w/c propulsion to the laundry room but washer in use so propelled to the ADL apt instead with mod I. He trialed using a SPC to ambulate around the kitchen, reaching into the fridge and transporting items to the counter. Pt at a very unsteady (S) level. Provided recommendation that pt not use the [facility] for functional mobility just yet. He trialed using BUE on the counter d/t kitchen not accessible to RW. This was much more safe with min cueing for adherence to back precautions. He then used the RW to complete functional mobility into the bathroom with mod I overall. He completed a TTB with (S), d/t needing min cueing. Pt brought to Ot's attention a rash on his neck, descending down his chest slightly, as well as it noticed on his posterior thighs proximally. Will notify MD on rounds. Pt returned to his w/c and required an extended rest break d/t pain. He completed laundry task- loading clothes into top loading washing machine. Pt able to complete with (S) overall. Pt used his RW to transfer to the NuStep- completing 10 min in order to increase strength/flexibility in his BLE, as well as hopefully alleviate some pain associated with tightness. He returned to his w/c and to his room. He was left sitting up with all needs met.   Therapy Documentation Precautions:  Precautions Precautions: Back, Fall Precaution Comments: reviewed 3/3 back precautions (no bending, lifting, twisting) Required Braces or Orthoses: Spinal Brace Spinal Brace: Applied in sitting  position, Lumbar corset Restrictions Weight Bearing Restrictions: No  Therapy/Group: Individual Therapy  Curtis Sites 12/24/2021, 6:18 AM

## 2021-12-24 NOTE — Progress Notes (Signed)
Inpatient Rehabilitation Discharge Medication Review by a Pharmacist  A complete drug regimen review was completed for this patient to identify any potential clinically significant medication issues.  High Risk Drug Classes Is patient taking? Indication by Medication  Antipsychotic No   Anticoagulant No   Antibiotic No   Opioid Yes OxyIR, methadone- pain  Antiplatelet Yes Aspirin- CVA prophylaxis  Hypoglycemics/insulin Yes Jardiance- T2DM  Vasoactive Medication Yes Norvasc, losartan- hypertension NitroSL- prn angina   Chemotherapy No   Other Yes Lipitor- HLD Zyrtec- seasonal allergies Cymbalta- MDD / neuropathies Lyrica- neuropathy     Type of Medication Issue Identified Description of Issue Recommendation(s)  Drug Interaction(s) (clinically significant)     Duplicate Therapy     Allergy     No Medication Administration End Date     Incorrect Dose     Additional Drug Therapy Needed     Significant med changes from prior encounter (inform family/care partners about these prior to discharge).    Other       Clinically significant medication issues were identified that warrant physician communication and completion of prescribed/recommended actions by midnight of the next day:  No   Time spent performing this drug regimen review (minutes):  30   Jaidy Cottam BS, PharmD, BCPS Clinical Pharmacist 12/24/2021 10:39 AM  Contact: [phone] after 3 PM  "Be curious, not judgmental..." -Debbora Dus

## 2021-12-24 NOTE — Progress Notes (Signed)
Physical Therapy Discharge Summary  Patient Details  Name: Scott Tucker MRN: [id number] Date of Birth: [birth year]  Date of Discharge from PT service:December 24, 2021   Patient has met 6 of 8 long term goals due to improved activity tolerance, improved balance, improved postural control, increased strength, and improved coordination.  Patient to discharge at  w/c level primarily and is ambulatory with RW  level Modified Independent. Patient's significant other participated in family education and reports confidence with ability to support and assist pt at discharge.   Reasons goals not met: Pt max ambulation distance was 75 ft with RW. Pt unable to ambulate to 100 feet due to pain, decreased activity tolerance and impaired balance. Pt able to navigate 12 steps with single point cane during admission. Pt unable to negotiate 20 steps due to pain and decreased activity tolerance. Pt educated to live on main level of condo for safety instead of negotiating 20 steps to access bedroom.   Recommendation:  Patient will benefit from ongoing skilled PT services in home health setting to continue to advance safe functional mobility, address ongoing impairments in strength, activity tolerance and coordination, and minimize fall risk.  Equipment: Manual w/c   Reasons for discharge: treatment goals met and discharge from hospital  Patient/family agrees with progress made and goals achieved: Yes  PT Discharge Pain Interference Pain Interference Pain Effect on Sleep: 4. Almost constantly Pain Interference with Therapy Activities: 4. Almost constantly Pain Interference with Day-to-Day Activities: 4. Almost constantly Cognition Overall Cognitive Status: Within Functional Limits for tasks assessed Arousal/Alertness: Awake/alert Orientation Level: Oriented X4 Memory: Appears intact Awareness: Appears intact Problem Solving: Appears intact Safety/Judgment: Appears intact Sensation Sensation Light  Touch: Impaired Detail (numbness, tingling, burning to bilateral LE) Proprioception: Impaired by gross assessment;Appears Intact Additional Comments: BLE nerve pain bilaterally, reports near constant shooting and burning pain Coordination Gross Motor Movements are Fluid and Coordinated: No Fine Motor Movements are Fluid and Coordinated: Yes Coordination and Movement Description: grossly impaired due to low back pain and pain Finger Nose Finger Test: intact Heel Shin Test: able to perform at decreased speed Motor  Motor Motor: Other (comment) (grossly impaired due to pain, weakness, coordination/balance deficits and decreased activity tolerance) Motor - Skilled Clinical Observations: grossly impaired due to low back pain, weakness, coordination/balance deficits and decreased activity tolerance  Mobility Bed Mobility Bed Mobility: Rolling Right;Rolling Left;Sit to Supine;Supine to Sit Rolling Right: Independent Rolling Left: Independent Right Sidelying to Sit: Independent Left Sidelying to Sit: Independent Supine to Sit: Independent Sitting - Scoot to Edge of Bed: Independent Sit to Supine: Independent Sit to Sidelying Right: Independent Transfers Transfers: Sit to Stand;Stand to Sit;Stand Pivot Transfers;Squat Pivot Transfers Sit to Stand: Independent with assistive device Stand to Sit: Independent with assistive device Stand Pivot Transfers: Independent with assistive device Squat Pivot Transfers: Independent Transfer (Assistive device): Rolling walker Locomotion  Gait Ambulation: Yes Gait Assistance: Independent with assistive device Gait Distance (Feet): 75 Feet (feet) Assistive device: Rolling walker Gait Assistance Details: decreased gait speed due to pain Gait Gait: Yes Gait Pattern: Impaired Gait Pattern: Antalgic;Shuffle Gait velocity: decreased Stairs / Additional Locomotion Stairs: Yes Stairs Assistance: Supervision/Verbal cueing Stair Management Technique: With  cane Number of Stairs: 12 Height of Stairs: 6 Ramp: Independent with assistive device Curb: Supervision/Verbal cueing Pick up small object from the floor (from standing position) activity did not occur: Safety/medical concerns (unable to complete due to spine precuations) Wheelchair Mobility Wheelchair Mobility: Yes Wheelchair Assistance: Independent with Camera operator:  Both upper extremities Wheelchair Parts Management: Supervision/cueing Distance: 200 feet  Trunk/Postural Assessment  Cervical Assessment Cervical Assessment: Within Functional Limits Thoracic Assessment Thoracic Assessment: Exceptions to [facility] (thoracic kyphosis) Lumbar Assessment Lumbar Assessment: Within Functional Limits Postural Control Postural Control: Deficits on evaluation Trunk Control: impaired due to low back pain, weakness and balance/coordination deficits  Balance Balance Balance Assessed: Yes Static Sitting Balance Static Sitting - Balance Support: Feet supported Static Sitting - Level of Assistance: 7: Independent Dynamic Sitting Balance Dynamic Sitting - Balance Support: Feet supported Dynamic Sitting - Level of Assistance: 7: Independent Sitting balance - Comments: LE coordination/balance activities Static Standing Balance Static Standing - Balance Support: Bilateral upper extremity supported Static Standing - Level of Assistance: 6: Modified independent (Device/Increase time) Dynamic Standing Balance Dynamic Standing - Balance Support: Bilateral upper extremity supported Dynamic Standing - Level of Assistance: 6: Modified independent (Device/Increase time) Dynamic Standing - Balance Activities: Other (comment) (with functional activity) RLE Assessment RLE Assessment: Exceptions to [facility] General Strength Comments: grossly at least 3/5 limited due to pain LLE Assessment LLE Assessment: Exceptions to [facility] General Strength Comments: grossly at least 3/5 limited due to  pain   Tanja Port PT, DPT  12/24/2021, 12:51 PM

## 2021-12-24 NOTE — Progress Notes (Signed)
PROGRESS NOTE   Subjective/Complaints:  He continues to have severe burning in his legs.  Plan for discharge home tomorrow.  LBM yesterday. Pt did have small rash on his anterior neck. Minimal pruritis.  Possible use of a new type of sponge.    ROS  Pt denies SOB, abd pain,cough, CP, palpitations, N/V/C/D, and vision or hearing changes  Objective:   No results found.  Recent Labs    12/24/21 0037  WBC 9.5  HGB 12.9*  HCT 39.3  PLT 468*    Recent Labs    12/24/21 0037  NA 141  K 4.4  CL 107  CO2 27  GLUCOSE 114*  BUN 9  CREATININE 0.70  CALCIUM 9.0     Intake/Output Summary (Last 24 hours) at 12/24/2021 1149 Last data filed at 12/24/2021 0517 Gross per 24 hour  Intake 600 ml  Output 950 ml  Net -350 ml         Physical Exam: Vital Signs Blood pressure 119/79, pulse 83, temperature 98.2 F (36.8 C), temperature source Oral, resp. rate 16, height 5\' 6"  (1.676 m), weight 82.3 kg, SpO2 94 %.   General: No acute distress Mood and affect are appropriate Heart: Regular rate and rhythm no rubs murmurs or extra sounds Lungs: CTAB, non-labored, good air movement Abdomen: Positive bowel sounds, soft nontender to palpation, nondistended Extremities: No clubbing, cyanosis, or edema    Extremities: No clubbing, cyanosis, or edema. Pulses are 2+ Skin: incision on lower back looks great- staples intact but no drainage or redness- still good- C/D/I Psych: appropriate, flat  Musculoskeletal:     Cervical back: Neck supple. No tenderness.     Right lower leg: No edema.     Left lower leg: No edema.   Wearing TED hose    Comments: UE 5/5 B/L  LE's 4/5- limited due to pain, but DF/PF also 4/5  Skin:    General: Skin is warm and dry.  Erythematous rash noted anterior neck    Comments: Surgical incision with staples, dressing CDI Neurological:     General: No focal deficit present.     Mental Status: He  is alert and oriented to person, place, and time.     Comments: Decreased to light touch from L knee down to toes Intact on RLE       Assessment/Plan: 1. Functional deficits which require 3+ hours per day of interdisciplinary therapy in a comprehensive inpatient rehab setting. Physiatrist is providing close team supervision and 24 hour management of active medical problems listed below. Physiatrist and rehab team continue to assess barriers to discharge/monitor patient progress toward functional and medical goals  Care Tool:  Bathing    Body parts bathed by patient: Left arm, Right arm, Chest, Abdomen, Front perineal area, Face, Buttocks, Right upper leg, Left upper leg, Right lower leg, Left lower leg   Body parts bathed by helper: Left upper leg, Right lower leg, Left lower leg, Right upper leg, Buttocks     Bathing assist Assist Level: Independent with assistive device     Upper Body Dressing/Undressing Upper body dressing   What is the patient wearing?: Pull over shirt, Orthosis Orthosis activity  level: Performed by patient  Upper body assist Assist Level: Independent    Lower Body Dressing/Undressing Lower body dressing      What is the patient wearing?: Pants     Lower body assist Assist for lower body dressing: Independent with assitive device     Toileting Toileting    Toileting assist Assist for toileting: Independent with assistive device Assistive Device Comment: RW   Transfers Chair/bed transfer  Transfers assist     Chair/bed transfer assist level: Independent with assistive device Chair/bed transfer assistive device: Arboriculturist assist      Assist level: Supervision/Verbal cueing Assistive device: Walker-rolling Max distance: 75 feet   Walk 10 feet activity   Assist     Assist level: Supervision/Verbal cueing Assistive device: Walker-rolling   Walk 50 feet activity   Assist Walk 50 feet with 2  turns activity did not occur: Safety/medical concerns (unable to perform due to pain, weakness and decreased activity tolerance)  Assist level: Supervision/Verbal cueing Assistive device: Walker-rolling    Walk 150 feet activity   Assist Walk 150 feet activity did not occur: Safety/medical concerns (unable to perform due to pain, weakness and decreased activity tolerance)         Walk 10 feet on uneven surface  activity   Assist     Assist level: Minimal Assistance - Patient > 75% Assistive device: Cane-straight (SPC and rail)   Wheelchair     Assist Is the patient using a wheelchair?: Yes Type of Wheelchair: Manual    Wheelchair assist level: Independent Max wheelchair distance: 200 ft    Wheelchair 50 feet with 2 turns activity    Assist        Assist Level: Independent   Wheelchair 150 feet activity     Assist  Wheelchair 150 feet activity did not occur:  (unable to perform due to pain, weakness and decreased activity tolerance)   Assist Level: Independent   Blood pressure 119/79, pulse 83, temperature 98.2 F (36.8 C), temperature source Oral, resp. rate 16, height 5\' 6"  (1.676 m), weight 82.3 kg, SpO2 94 %.  Medical Problem List and Plan: 1. Functional deficits secondary to lumbar stenosis s/p lumbar fusion L3-4, L4-5             -patient may  shower-cover incision             -ELOS/Goals: 8/15- supervision  -Continue CIR- PT, OT - nerve pain biggest limitation, a little improved with current medications  -Home tomorrow 2.  Antithrombotics: -DVT/anticoagulation:  Mechanical: Sequential compression devices, below knee Bilateral lower extremities--will check dopplers due to immobility/LLE weakness.              -antiplatelet therapy: aspirin 81 mg daily 3. Pain Management: MS contin add for more consistent pain relief.  --Norco changed to oxycodone as getting close to 4 gram tylenol. 5-10 mg q4 hours prn --continue robaxin prn for muscle  spasms.              -Lyrica 75 mg BID- increased as of 8/56 yo 150 mg BID- increased to 200mg  TID on 8/12             -added MS Contin 15 mg BID and d/c'd scheduled Norco and prn Norco             -K-pad  -7/4 pain much improved with medication adjustments  8/5- will increase to 10-15 mg q4 hours prn for Oxy- made clear  we don't do IV pain meds here.   8/6- change MS Contin to methadone 5 mg BID since cannot get Nucynta, etc in hospital and increased lyrica to 150 mg TID  8/7 Started duloxetine 30mg  daily  8/10 Lumbar xray- stable fusion hardware  8/11 continue current medications and monitor response  8/14 Decrease oxycodone to 15mg  Q6h Prn, increase duloxetine to 60mg  daily, lyrica was increased to 200 TID on 8/12   4. Mood/Behavior/Sleep: LCSW to evaluate and provide emotional support             -antipsychotic agents: n/a 5. Neuropsych/cognition: This patient is capable of making decisions on his own behalf. 6. Skin/Wound Care: Routine skin care checks             -monitor surgical incision 7. Fluids/Electrolytes/Nutrition: Routine Is and Os and follow-up chemistries 8: S/p Lumbar fusion Dr. 7/25. Maintain Aspen brace             -continue prednisone taper - complete 9: Hypertension: continue Norvasc, Cozaar  -8/11 well controlled 10: Hyperlipidemia: continue Lipitor 11: DM-2: CBGs QID             -continue Jardiance, insulin 4 units with meals, SS  8/11 Will decreased insulin to 2 units with meals, not using insulin at home  8/14 Stop mealtime insulin, stat metformin 250mg  daily 12. Leucocytosis: WBC 16.1 on POD#1--likely reactive  with + steroids 7/30.   --Continue to monitor for signs of infection. CRP-0.6 (on 08/03)             --8/4 WBC higher today at 17.8, no signs of infection noted at this time, likely related to steroids, will recheck CBC tomorrow  8/14 WBC down to 9.5 stable  13. Vitamin D deficiency: 06/15 labs w/Vit D-10.3. Continue Ergocalciferol weekly.  14.  Hypokalemia  -KCL 8/30 x2, recheck BMET tomorrow  8/14 K+ 4.4 stable 15. Rash anterior neck. Minimal itching at this time. Avoid new lotions, soaps etc. Pt thinks he used a new type of sponge, will stop use of this item.   LOS: 11 days A FACE TO FACE EVALUATION WAS PERFORMED  9/14 12/24/2021, 11:49 AM

## 2021-12-24 NOTE — Progress Notes (Signed)
Physical Therapy Session Note  Patient Details  Name: Scott Tucker MRN: [id number] Date of Birth: [birth year]  Today's Date: 12/24/2021 PT Individual Time: 1101-1206 PT Individual Time Calculation (min): 65 min   Short Term Goals: Week 1:  PT Short Term Goal 1 (Week 1): STG=LTG due to ELOS  Skilled Therapeutic Interventions/Progress Updates:      Therapy Documentation Precautions:  Precautions Precautions: Back, Fall Precaution Comments: reviewed 3/3 back precautions (no bending, lifting, twisting) Required Braces or Orthoses: Spinal Brace Spinal Brace: Applied in sitting position, Lumbar corset Restrictions Weight Bearing Restrictions: No   Pt received semi-reclined in bed eating with S/O present. Pt agreeable to PT session and was pre-medicated. PT assessed pain interference, sensation, coordination, balance and strength. PT educated spouse on how to safely guard patient with squat and stand pivot transfers. Pt instructed to utilize RW for all mobility except for stair negotiation, in which pt will utilize single point cane that S/O plans to obtain. Pt propelled w/c mod I to main gym for stair training. PT educated and demonstrated stair negotiation with cane. Pt ascended and descended 4 steps with single point cane and no rail with supervision. Pt's S/O educated on how to guard and provide hand held assist for balance. Discussed with patient and S/O PT's recommendation for patient to reside on main level at discharge for safety and S/O in agreement. PT educated pt and S/O on spine precautions and pt demonstrated adherence to restrictions with bed mobility (independent). Pt with increased pain and provided with rest. Pt propelled w/c to room and left seated edge of bed with S/O present and all needs in reach. Pt marked independent in room with RW and sign posted.   Therapy/Group: Individual Therapy  Truitt Leep Truitt Leep PT, DPT  12/24/2021, 7:45 AM

## 2021-12-24 NOTE — Discharge Instructions (Addendum)
Inpatient Rehab Discharge Instructions  Scott Tucker Discharge date and time:  12/25/2021  Activities/Precautions/ Functional Status: Activity: no lifting, driving, or strenuous exercise till cleared by MD Diet: diabetic diet Wound Care: keep wound clean and dry. Contact Dr. Otelia Sergeant if you develop any problems with your incision/wound--redness, swelling, increase in pain, drainage or if you develop fever or chills.     Functional status:  ___ No restrictions     ___ Walk up steps independently ___ 24/7 supervision/assistance   ___ Walk up steps with assistance _X__ Intermittent supervision/assistance  __X_ Bathe/dress independently ___ Walk with walker     ___ Bathe/dress with assistance ___ Walk Independently    ___ Shower independently ___ Walk with assistance    _X__ Shower with assistance _X__ No alcohol     ___ Return to work/school ________  Special Instructions: No driving, alcohol consumption or tobacco use.    COMMUNITY REFERRALS UPON DISCHARGE:    Home Health:   PT, OT  RN                Agency: [facility]   Phone: [phone]   Medical Equipment/Items Ordered:WHEELCHAIR  WILL GET ROLLING WALKER AND TUB BENCH ON OWN DUE TO NOT COVERED                                                 Agency/Supplier:[facility]   [phone]   My questions have been answered and I understand these instructions. I will adhere to these goals and the provided educational materials after my discharge from the hospital.  Patient/Caregiver Signature _______________________________ Date __________  Clinician Signature _______________________________________ Date __________  Please bring this form and your medication list with you to all your follow-up doctor's appointments.

## 2021-12-24 NOTE — Progress Notes (Signed)
Discussed need to decrease Oxycodone to 15 mg qid (90 MME) per DEA rules. 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 He is refusing low dose metformin--has been on novolog 2 units TID-->will d/c and monitor overnight. Discussed diet, activity and need to check 3-4 times a day to keep BS close to 100 to promote wound healing.   He has developed rash over last 24-48 hours. Appears worse on shoulders, sides and posterior thigh. Hypoallergenic sheets ordered. Will also add triamcinolone cream and allergra for likely contact dermatitis.

## 2021-12-25 ENCOUNTER — Encounter (INDEPENDENT_AMBULATORY_CARE_PROVIDER_SITE_OTHER): Payer: Self-pay | Admitting: Primary Care

## 2021-12-25 ENCOUNTER — Encounter: Payer: Self-pay | Admitting: Specialist

## 2021-12-25 ENCOUNTER — Ambulatory Visit (INDEPENDENT_AMBULATORY_CARE_PROVIDER_SITE_OTHER): Payer: Medicare Other | Admitting: Primary Care

## 2021-12-25 ENCOUNTER — Other Ambulatory Visit (HOSPITAL_COMMUNITY): Payer: Self-pay

## 2021-12-25 DIAGNOSIS — M48062 Spinal stenosis, lumbar region with neurogenic claudication: Secondary | ICD-10-CM | POA: Diagnosis not present

## 2021-12-25 DIAGNOSIS — M792 Neuralgia and neuritis, unspecified: Secondary | ICD-10-CM | POA: Diagnosis not present

## 2021-12-25 DIAGNOSIS — L259 Unspecified contact dermatitis, unspecified cause: Secondary | ICD-10-CM

## 2021-12-25 DIAGNOSIS — E119 Type 2 diabetes mellitus without complications: Secondary | ICD-10-CM | POA: Diagnosis not present

## 2021-12-25 LAB — GLUCOSE, CAPILLARY: Glucose-Capillary: 90 mg/dL (ref 70–99)

## 2021-12-25 MED ORDER — CAMPHOR-MENTHOL 0.5-0.5 % EX LOTN: TOPICAL_LOTION | CUTANEOUS | 0 refills | Status: DC | PRN

## 2021-12-25 MED ORDER — FEXOFENADINE HCL 60 MG PO TABS
60.0000 mg | ORAL_TABLET | Freq: Two times a day (BID) | ORAL | 0 refills | Status: DC
  Filled 2021-12-25: qty 60, 30d supply, fill #0

## 2021-12-25 MED ORDER — FEXOFENADINE HCL 60 MG PO TABS
60.0000 mg | ORAL_TABLET | Freq: Two times a day (BID) | ORAL | Status: DC
  Administered 2021-12-25: 60 mg via ORAL
  Filled 2021-12-25 (×2): qty 1

## 2021-12-25 MED ORDER — CETIRIZINE HCL 10 MG PO TABS
10.0000 mg | ORAL_TABLET | Freq: Every day | ORAL | 0 refills | Status: DC
  Filled 2021-12-25: qty 30, 30d supply, fill #0

## 2021-12-25 NOTE — Progress Notes (Signed)
Inpatient Rehabilitation Care Coordinator Discharge Note   Patient Details  Name: Scott Tucker MRN: [id number] Date of Birth: 1965/11/08   Discharge location: HOME ALONE WITN INTERMITTENT ASSIST FROM FRIEND. IF PLAN FALLS THROUGH WILL COME UP WITH SOME OTHER OPTION  Length of Stay: 12 DAYS  Discharge activity level: SUPERVISION-MOD/I LEVEL  Home/community participation: ACTIVE  Patient response AT:FTDDUK Literacy - How often do you need to have someone help you when you read instructions, pamphlets, or other written material from your doctor or pharmacy?: Never  Patient response GU:RKYHCW Isolation - How often do you feel lonely or isolated from those around you?: Never  Services provided included: MD, RD, PT, OT, RN, CM, TR, Pharmacy, Neuropsych, SW  Financial Services:  Field seismologist Utilized: Engineer, civil (consulting)  Choices offered to/list presented to: PT  Follow-up services arranged:  Home Health, DME, Patient/Family has no preference for HH/DME agencies Home Health Agency: [facility] PT  OT  RN    DME : [facility]-WHEELCHAIR, FRIEND GOT ROLLING WALKER AND TUB BENCH    Patient response to transportation need: Is the patient able to respond to transportation needs?: Yes In the past 12 months, has lack of transportation kept you from medical appointments or from getting medications?: Yes In the past 12 months, has lack of transportation kept you from meetings, work, or from getting things needed for daily living?: Yes    Comments (or additional information): Scott Tucker WAS HERE FOR EDUCATION. PT CONTINUES TO HAVE PAIN ISSUES WHICH LIMIT HIS MOVEMENT AND MAKE HIM AT A GREAT RISK TO FALL AT HOME. ESPECIALLY SINCE HOME ALONE  Patient/Family verbalized understanding of follow-up arrangements:  Yes  Individual responsible for coordination of the follow-up plan: Scott Tucker-FRIEND [phone]  Confirmed correct DME delivered: Scott Tucker  12/25/2021    Scott Tucker, Scott Tucker

## 2021-12-25 NOTE — Progress Notes (Addendum)
Patient ID: Scott Tucker, male   DOB: July 25, 1965, 56 y.o.   MRN: [id number]  Pt informed worker he may not be able to go to where he was staying. Discussed his discharge today and he has no option to go to a SNF, other option is possibly the shelter if he can get in. Will await [name]'s return call.  10:50 AM Spoke with patrice who has not spoken with the family he lives with last she was told they were deciding if he could come back there. Suggested what will they do if he shows up there. Told pt to call them and speak with them and come up with a plan for where he is going to go from here.

## 2021-12-25 NOTE — Progress Notes (Signed)
PROGRESS NOTE   Subjective/Complaints:  DC home today.  Pain a little improved this AM. He is noted to have rash on back, chest and legs yesterday.   ROS  Pt denies SOB, abd pain,cough, CP, palpitations, N/V/C/D, and vision or hearing changes + rash  Objective:   No results found.  Recent Labs    12/24/21 0037  WBC 9.5  HGB 12.9*  HCT 39.3  PLT 468*    Recent Labs    12/24/21 0037  NA 141  K 4.4  CL 107  CO2 27  GLUCOSE 114*  BUN 9  CREATININE 0.70  CALCIUM 9.0     Intake/Output Summary (Last 24 hours) at 12/25/2021 1051 Last data filed at 12/25/2021 0557 Gross per 24 hour  Intake 355 ml  Output 1075 ml  Net -720 ml         Physical Exam: Vital Signs Blood pressure 126/85, pulse 90, temperature 98.4 F (36.9 C), temperature source Oral, resp. rate 18, height 5\' 6"  (1.676 m), weight 82.3 kg, SpO2 96 %.   General: No acute distress Mood and affect are appropriate Heart: Regular rate and rhythm no rubs murmurs or extra sounds Lungs: CTAB, non-labored, good air movement Abdomen: Positive bowel sounds, soft nontender to palpation, nondistended Extremities: No clubbing, cyanosis, or edema    Extremities: No clubbing, cyanosis, or edema. Pulses are 2+ Skin: incision on lower back looks great- staples intact but no drainage or redness- still good- C/D/I Psych: appropriate, normal affect  Musculoskeletal:     Cervical back: Neck supple. No tenderness.     Right lower leg: No edema.     Left lower leg: No edema.   Wearing TED hose    Comments: UE 5/5 B/L  LE's 4/5- limited due to pain, but DF/PF also 4/5  Skin:    General: Skin is warm and dry.  Erythematous rash noted on back, chest and upper legs    Comments: Surgical incision with staples, dressing CDI Neurological:     General: No focal deficit present.     Mental Status: He is alert and oriented to person, place, and time.      Comments: Decreased to light touch from L knee down to toes Intact on RLE       Assessment/Plan: 1. Functional deficits which require 3+ hours per day of interdisciplinary therapy in a comprehensive inpatient rehab setting. Physiatrist is providing close team supervision and 24 hour management of active medical problems listed below. Physiatrist and rehab team continue to assess barriers to discharge/monitor patient progress toward functional and medical goals  Care Tool:  Bathing    Body parts bathed by patient: Left arm, Right arm, Chest, Abdomen, Front perineal area, Face, Buttocks, Right upper leg, Left upper leg, Right lower leg, Left lower leg   Body parts bathed by helper: Left upper leg, Right lower leg, Left lower leg, Right upper leg, Buttocks     Bathing assist Assist Level: Independent with assistive device     Upper Body Dressing/Undressing Upper body dressing   What is the patient wearing?: Pull over shirt, Orthosis Orthosis activity level: Performed by patient  Upper body assist Assist Level:  Independent    Lower Body Dressing/Undressing Lower body dressing      What is the patient wearing?: Pants     Lower body assist Assist for lower body dressing: Independent with assitive device     Toileting Toileting    Toileting assist Assist for toileting: Independent with assistive device Assistive Device Comment: RW   Transfers Chair/bed transfer  Transfers assist     Chair/bed transfer assist level: Independent with assistive device Chair/bed transfer assistive device: Geologist, engineering   Ambulation assist      Assist level: Independent with assistive device Assistive device: Walker-rolling Max distance: 75 feet   Walk 10 feet activity   Assist     Assist level: Independent with assistive device Assistive device: Walker-rolling   Walk 50 feet activity   Assist Walk 50 feet with 2 turns activity did not occur:  Safety/medical concerns (unable to perform due to pain, weakness and decreased activity tolerance)  Assist level: Independent with assistive device Assistive device: Walker-rolling    Walk 150 feet activity   Assist Walk 150 feet activity did not occur: Safety/medical concerns (unable to perform due to pain, weakness, and decreased activity tolerance)         Walk 10 feet on uneven surface  activity   Assist     Assist level: Independent with assistive device Assistive device: Walker-rolling   Wheelchair     Assist Is the patient using a wheelchair?: Yes Type of Wheelchair: Manual    Wheelchair assist level: Independent Max wheelchair distance: 200 ft    Wheelchair 50 feet with 2 turns activity    Assist        Assist Level: Independent   Wheelchair 150 feet activity     Assist  Wheelchair 150 feet activity did not occur:  (unable to perform due to pain, weakness and decreased activity tolerance)   Assist Level: Independent   Blood pressure 126/85, pulse 90, temperature 98.4 F (36.9 C), temperature source Oral, resp. rate 18, height 5\' 6"  (1.676 m), weight 82.3 kg, SpO2 96 %.  Medical Problem List and Plan: 1. Functional deficits secondary to lumbar stenosis s/p lumbar fusion L3-4, L4-5             -patient may  shower-cover incision             -ELOS/Goals: 8/15- supervision  -Continue CIR- PT, OT - nerve pain biggest limitation, a little improved with current medications  -seen by neuropsychology 8/14  -Home today 2.  Antithrombotics: -DVT/anticoagulation:  Mechanical: Sequential compression devices, below knee Bilateral lower extremities--will check dopplers due to immobility/LLE weakness.              -antiplatelet therapy: aspirin 81 mg daily 3. Pain Management: MS contin add for more consistent pain relief.  --Norco changed to oxycodone as getting close to 4 gram tylenol. 5-10 mg q4 hours prn --continue robaxin prn for muscle spasms.               -Lyrica 75 mg BID- increased as of 8/56 yo 150 mg BID- increased to 200mg  TID on 8/12             -added MS Contin 15 mg BID and d/c'd scheduled Norco and prn Norco             -K-pad  -7/4 pain much improved with medication adjustments  8/5- will increase to 10-15 mg q4 hours prn for Oxy- made clear we don't do IV  pain meds here.   8/6- change MS Contin to methadone 5 mg BID since cannot get Nucynta, etc in hospital and increased lyrica to 150 mg TID  8/7 Started duloxetine 30mg  daily  8/10 Lumbar xray- stable fusion hardware  8/11 continue current medications and monitor response  8/14 Decrease oxycodone to 15mg  Q6h Prn, increase duloxetine to 60mg  daily, lyrica was increased to 200 TID on 8/12  8/15 Pain improved, will continue to wean opioid medications as outpatient   4. Mood/Behavior/Sleep: LCSW to evaluate and provide emotional support             -antipsychotic agents: n/a 5. Neuropsych/cognition: This patient is capable of making decisions on his own behalf. 6. Skin/Wound Care: Routine skin care checks             -monitor surgical incision 7. Fluids/Electrolytes/Nutrition: Routine Is and Os and follow-up chemistries 8: S/p Lumbar fusion Dr. Louanne Skye 7/25. Maintain Aspen brace             -continue prednisone taper - complete 9: Hypertension: continue Norvasc, Cozaar  -8/11 well controlled 10: Hyperlipidemia: continue Lipitor 11: DM-2: CBGs QID             -continue Jardiance, insulin 4 units with meals, SS  8/11 Will decreased insulin to 2 units with meals, not using insulin at home  8/14 Stop mealtime insulin, start metformin 250mg  daily  8/15 pt declined metformin, CBGs well controlled overall 12. Leucocytosis: WBC 16.1 on POD#1--likely reactive  with + steroids 7/30.   --Continue to monitor for signs of infection. CRP-0.6 (on 08/03)             --8/4 WBC higher today at 17.8, no signs of infection noted at this time, likely related to steroids, will recheck CBC  tomorrow  8/14 WBC down to 9.5 stable  13. Vitamin D deficiency: 06/15 labs w/Vit D-10.3. Continue Ergocalciferol weekly.  14. Hypokalemia  -KCL 10meq x2, recheck BMET tomorrow  8/14 K+ 4.4 stable 15. Rash. Likely contract dermatitis, hypoallergenic sheets, triamcinolone cream and allegra started   LOS: 12 days A FACE TO FACE EVALUATION WAS PERFORMED  Jennye Boroughs 12/25/2021, 10:51 AM

## 2021-12-26 ENCOUNTER — Other Ambulatory Visit (HOSPITAL_COMMUNITY): Payer: Self-pay

## 2021-12-26 ENCOUNTER — Telehealth: Payer: Self-pay

## 2021-12-26 DIAGNOSIS — Z4789 Encounter for other orthopedic aftercare: Secondary | ICD-10-CM | POA: Diagnosis not present

## 2021-12-26 DIAGNOSIS — I10 Essential (primary) hypertension: Secondary | ICD-10-CM | POA: Diagnosis not present

## 2021-12-26 DIAGNOSIS — M48061 Spinal stenosis, lumbar region without neurogenic claudication: Secondary | ICD-10-CM | POA: Diagnosis not present

## 2021-12-26 DIAGNOSIS — I2510 Atherosclerotic heart disease of native coronary artery without angina pectoris: Secondary | ICD-10-CM | POA: Diagnosis not present

## 2021-12-26 DIAGNOSIS — E119 Type 2 diabetes mellitus without complications: Secondary | ICD-10-CM | POA: Diagnosis not present

## 2021-12-26 DIAGNOSIS — G709 Myoneural disorder, unspecified: Secondary | ICD-10-CM | POA: Diagnosis not present

## 2021-12-26 DIAGNOSIS — F419 Anxiety disorder, unspecified: Secondary | ICD-10-CM | POA: Diagnosis not present

## 2021-12-26 DIAGNOSIS — M4316 Spondylolisthesis, lumbar region: Secondary | ICD-10-CM | POA: Diagnosis not present

## 2021-12-26 DIAGNOSIS — G8929 Other chronic pain: Secondary | ICD-10-CM | POA: Diagnosis not present

## 2021-12-26 DIAGNOSIS — M1990 Unspecified osteoarthritis, unspecified site: Secondary | ICD-10-CM | POA: Diagnosis not present

## 2021-12-26 NOTE — Telephone Encounter (Signed)
Dana RN with [facility] called stating that the pt fell down the last 2 steps of his townhome while getting to the door to let her in. She states his incisions look good and are still well approximated. His caregiver states he is not supposed to be climbing stairs, but he is unwilling to stay on the first floor.  Reviewed pt's chart, returned call for clarification, two identifiers used. Msg sent to Dr Barbaraann Faster office to f/u.

## 2021-12-27 ENCOUNTER — Telehealth: Payer: Self-pay

## 2021-12-27 NOTE — NC FL2 (Signed)
[place] MEDICAID FL2 LEVEL OF CARE SCREENING TOOL     IDENTIFICATION  Patient Name: Scott Tucker Birthdate: [birth year] Sex: male Admission Date (Current Location): 12/13/2021  [place] and [place] Number:  Haynes Bast [id number] T Facility and Address:  [facility], [address] [place], [place] [address]      Provider Number: [id number]  Attending Physician Name and Address:  No att. providers found  Relative Name and Phone Number:  [name]-friend [phone]    Current Level of Care: Home Recommended Level of Care: Assisted Living Facility Prior Approval Number:    Date Approved/Denied:   PASRR Number:    Discharge Plan: Other (Comment) (ALF)    Current Diagnoses: Patient Active Problem List   Diagnosis Date Noted   Depressive reaction    Leukocytosis    Neuropathic pain    Spondylolisthesis, lumbar region 12/04/2021   Degenerative disc disease, lumbar 12/04/2021   Lumbar stenosis 12/04/2021   Status post lumbar spinal fusion 12/04/2021   Spinal stenosis of lumbar region 05/30/2021   S/P cervical spinal fusion 12/18/2020   Chronic radicular cervical pain 05/01/2020   Foraminal stenosis of cervical region (right) 05/01/2020   Chronic pain syndrome 05/01/2020   History of lumbar fusion 05/01/2020   HTN (hypertension)    Diabetes mellitus without complication (HCC)    Chronic back pain    Coronary artery disease    CAD (coronary artery disease) 03/22/2019   Hypertension 02/23/2019   Hyperlipidemia 02/23/2019   Chest pain 02/22/2019    Orientation RESPIRATION BLADDER Height & Weight     Self, Time, Situation, Place  Normal Continent Weight: 181 lb 7 oz (82.3 kg) Height:  5\' 6"  (167.6 cm)  BEHAVIORAL SYMPTOMS/MOOD NEUROLOGICAL BOWEL NUTRITION STATUS      Continent Diet (regular thin liquids)  AMBULATORY STATUS COMMUNICATION OF NEEDS Skin   Supervision Verbally Surgical wounds                       Personal Care  Assistance Level of Assistance  Bathing, Dressing, Feeding Bathing Assistance: Independent Feeding assistance: Independent Dressing Assistance: Independent     Functional Limitations Info             SPECIAL CARE FACTORS FREQUENCY  PT (By licensed PT), OT (By licensed OT)     PT Frequency: 3x week OT Frequency: 3x week            Contractures Contractures Info: Not present    Additional Factors Info  Code Status, Allergies Code Status Info: full code Allergies Info: latex           Current Medications (12/27/2021):  This is the current hospital active medication list No current facility-administered medications for this encounter.   Current Outpatient Medications  Medication Sig Dispense Refill   nitroGLYCERIN (NITROSTAT) 0.4 MG SL tablet Place 1 tablet (0.4 mg total) under the tongue every 5 (five) minutes as needed for chest pain. 75 tablet 1   amLODipine (NORVASC) 10 MG tablet Take 1 tablet (10 mg total) by mouth once daily. 30 tablet 0   aspirin EC 81 MG tablet Take 1 tablet (81 mg total) by mouth daily. Swallow whole. 100 tablet 12   atorvastatin (LIPITOR) 40 MG tablet Take 1 tablet (40 mg total) by mouth daily. 30 tablet 0   camphor-menthol (SARNA) lotion Apply topically as needed for itching. 222 mL 0   cetirizine (ZYRTEC) 10 MG tablet Take 1 tablet (10 mg total) by mouth  daily. 30 tablet 0   docusate sodium (COLACE) 100 MG capsule Take 1 capsule (100 mg total) by mouth 2 (two) times daily. 60 capsule 0   DULoxetine (CYMBALTA) 60 MG capsule Take 1 capsule (60 mg total) by mouth daily. 30 capsule 0   empagliflozin (JARDIANCE) 25 MG TABS tablet Take 1 tablet (25 mg total) by mouth once daily before breakfast. 30 tablet 0   ergocalciferol (VITAMIN D2) 1.25 MG (50000 UT) capsule Take 1 capsule (50,000 Units total) by mouth once a week. 12 capsule 0   losartan (COZAAR) 25 MG tablet Take 1 tablet (25 mg total) by mouth once daily. (Please make overdue appt with Dr.  Eldridge Dace before anymore refills. Thank you 1st attempt) 30 tablet 0   methadone (DOLOPHINE) 5 MG tablet Take 1 tablet (5 mg total) by mouth every 12 (twelve) hours. 14 tablet 0   methocarbamol (ROBAXIN) 500 MG tablet Take 1 tablet (500 mg total) by mouth every 6 (six) hours as needed for muscle spasms. 120 tablet 1   oxyCODONE (ROXICODONE) 15 MG immediate release tablet Take 1 tablet (15 mg total) by mouth every 6 (six) hours as needed for moderate pain or severe pain. 28 tablet 0   pregabalin (LYRICA) 200 MG capsule Take 1 capsule (200 mg total) by mouth 3 (three) times daily. 90 capsule 0   triamcinolone cream (KENALOG) 0.1 % Apply topically 3 (three) times daily. 90 g 0     Discharge Medications: Please see discharge summary for a list of discharge medications.  Relevant Imaging Results:  Relevant Lab Results:   Additional Information Needs assisted living/resthome someone to keep an eye on him and assist with medications, meals and one level  Dandra Shambaugh, [name], LCSW

## 2021-12-27 NOTE — Telephone Encounter (Signed)
Transition Care Management Follow-up Telephone Call Date of discharge and from where: 12/26/2030, [facility]- inpatient rehab How have you been since you were released from the hospital? He stated he is doing all right. Any questions or concerns? Yes-  he asked that I contact his girlfriend, Rodell Perna, about housing, ALF. He said he looked into a place on [place] but he could not remember the name of the facility.  I called [name] # [phone] and left a message requesting a call back. There is no DPR to speak to [name] but patient gave me verbal authorization to speak with her regarding his medical conditions and needs.   Items Reviewed: Did the pt receive and understand the discharge instructions provided? Yes  Medications obtained and verified? Yes - he said he has all of the medications but needs help to remember taking them. He is considering a pill box. I also explained that some pharmacies will bubble pack medications and also offer home delivery.  He said he will think about that.  Other? No  Any new allergies since your discharge? No  Dietary orders reviewed? No Do you have support at home?  He is staying with his Pauline Aus and is on the first floor of her home. He is no longer staying at the home where he fell down some stairs.   Home Care and Equipment/Supplies: Were home health services ordered? yes If so, what is the name of the agency? [facility]  Has the agency set up a time to come to the patient's home? Yes- they have been out to see him  Were any new equipment or medical supplies ordered?  Yes: wheelchair What is the name of the medical supply agency? [facility]  Were you able to get the supplies/equipment? yes Do you have any questions related to the use of the equipment or supplies? No  Functional Questionnaire: (I = Independent and D = Dependent) ADLs: he stated he uses a cane or walker with ambulation.  He also requires assistance with ADLs and is dependent  on others for assistance with managing his medications.    Follow up appointments reviewed:  PCP Hospital f/u appt confirmed? Yes  Scheduled to see Efrain Sella, NP - 01/25/2022.  He needs to check with Rodell Perna before changing that appointment.  Specialist Hospital f/u appt confirmed? Yes  Scheduled to see PMR- 01/01/2022.   Are transportation arrangements needed? No - he said Rodell Perna drives If their condition worsens, is the pt aware to call PCP or go to the Emergency Dept.? Yes Was the patient provided with contact information for the PCP's office or ED? Yes Was to pt encouraged to call back with questions or concerns? Yes

## 2021-12-27 NOTE — Progress Notes (Addendum)
Patient ID: Scott Tucker, male   DOB: [birth year], 56 y.o.   MRN: [id number] Message from MD office pt has fallen walking down steps to let home health therapist in. Pt was mod/I with rolling walker but not advised to do stairs on his own. Discussion prior to discharge of not knowing if people he was staying with will allow him back there, but not made clear at discharge. Discussed his insurance will not cover SNF due to mod/I level and came to rehab. He would need to pursue assisted living or rest home he is considered custodial care which is not covered by his health insurance. Have completed an FL2 for ALF or RH and have sent out to see if any offers. Directed facilities admissions to pt and [name] to follow up with, since pt has been discharged from the hospital. [facility] to have a home health social worker see when able. If further questions feel; free to reach out. Worse case scenio if not able to stay with his friend/family would need to go to the shelter.

## 2021-12-28 ENCOUNTER — Telehealth: Payer: Self-pay | Admitting: Primary Care

## 2021-12-28 NOTE — Telephone Encounter (Signed)
Copied from CRM [id number]. Topic: General - Other >> Dec 27, 2021  4:46 PM Everette C wrote: Reason for CRM: The patient's significant other Ms Adela Glimpse has returned a missed call from Erskine Squibb B   Please contact further when possible

## 2021-12-28 NOTE — Telephone Encounter (Signed)
Scott Tucker tried to call you back and I could not get a number or answer at [facility].  Please return her call at [phone]

## 2021-12-31 DIAGNOSIS — Z008 Encounter for other general examination: Secondary | ICD-10-CM | POA: Diagnosis not present

## 2021-12-31 NOTE — Telephone Encounter (Signed)
I spoke to Scott Tucker today and that conversation is documented in another telephone call today.

## 2021-12-31 NOTE — Telephone Encounter (Signed)
I spoke to [place] . She explained that she was contacted by [facility] and the patient cannot afford that facility.  She said there is no facility on [address] but they have contacted [facility] however, that facility does not offer assisted living.   Rodell Perna has access to a computer and I encouraged her to check [email or web address] and review the listing of Adult Care Homes/ALF.  She can call to check male bed availability and explain patient's needs as well as visit the facility.  I explained that the facilities that accept Medicaid are very limited and have a notation of State/ [employer] on the listing. The other facilities are private pay.   Once they have found a facility with an available bed,  I asked her to let me know and we can send the FL2 to that facility. I also explained to her that FL2 was sent to ALFs by the hospital rehab SW.

## 2021-12-31 NOTE — Telephone Encounter (Signed)
I just spoke to [facility] and sent you a telephone encounter

## 2022-01-01 ENCOUNTER — Encounter: Payer: Self-pay | Admitting: Registered Nurse

## 2022-01-01 ENCOUNTER — Encounter: Payer: Medicare Other | Attending: Registered Nurse | Admitting: Registered Nurse

## 2022-01-01 VITALS — BP 117/75 | HR 85 | Ht 66.0 in | Wt 180.0 lb

## 2022-01-01 DIAGNOSIS — G894 Chronic pain syndrome: Secondary | ICD-10-CM | POA: Diagnosis not present

## 2022-01-01 DIAGNOSIS — Z79899 Other long term (current) drug therapy: Secondary | ICD-10-CM | POA: Insufficient documentation

## 2022-01-01 DIAGNOSIS — M48061 Spinal stenosis, lumbar region without neurogenic claudication: Secondary | ICD-10-CM | POA: Diagnosis not present

## 2022-01-01 DIAGNOSIS — M79605 Pain in left leg: Secondary | ICD-10-CM | POA: Diagnosis not present

## 2022-01-01 DIAGNOSIS — M792 Neuralgia and neuritis, unspecified: Secondary | ICD-10-CM | POA: Insufficient documentation

## 2022-01-01 DIAGNOSIS — Z5181 Encounter for therapeutic drug level monitoring: Secondary | ICD-10-CM | POA: Insufficient documentation

## 2022-01-01 DIAGNOSIS — I10 Essential (primary) hypertension: Secondary | ICD-10-CM | POA: Insufficient documentation

## 2022-01-01 DIAGNOSIS — M79604 Pain in right leg: Secondary | ICD-10-CM | POA: Insufficient documentation

## 2022-01-01 DIAGNOSIS — E7849 Other hyperlipidemia: Secondary | ICD-10-CM | POA: Diagnosis not present

## 2022-01-01 DIAGNOSIS — Z981 Arthrodesis status: Secondary | ICD-10-CM | POA: Diagnosis not present

## 2022-01-01 MED ORDER — OXYCODONE HCL 15 MG PO TABS: 15.0000 mg | ORAL_TABLET | Freq: Four times a day (QID) | ORAL | 0 refills | Status: DC | PRN

## 2022-01-01 NOTE — Progress Notes (Unsigned)
Subjective:    Patient ID: Scott Tucker, male    DOB: 1965/11/12, 56 y.o.   MRN: [id number]  HPI: Scott Tucker is a 56 y.o. male who is here for HFU for follow up of his S/P Lumbar Fusion, Lumbar Stenosis, Neuropathic Pain, Muscle Spasm, Essential Hypertension, Hyperlipidemia, and Type Two DM. Scott Tucker reports bilateral lower extremities pain, numbness and hypersensitivity.  This provider spoke with Dr. Benjie Karvonen regarding Scott Tucker increase bilateral lower extremities pain R>L, right lower extremity  with coolness noted to Right lower extremity with tissue discoloration. Scott Tucker states he had the above complaints while in hospital, Scott Tucker significant other states he has been complaining of increase pain and she doesn't remember seeing the skin discoloration,. Venous doppler ordered, they verbalizes understanding. Dr Natale Lay is aware of the above.   Scott Tucker was admitted to [facility] on 12/04/2021 for , by Dr Otelia Sergeant : TRANFORAMINAL LUMBAR INTERBODY FUSIONS LEFT LUMBAR THREE - FOUR AND LEFT LUMBAR FOUR - FIVE WITH REMOVAL OF HARDWARE LUMBAR FIVE -SACRAL ONE, POSTERIOR INSTRUMENTATION LUMBAR THREE, LUMBAR FOUR, LUMBAR FIVE AND SACRAL ONE, RODS SCREWS AND CAGES, LOCAL AND ALLOGRAFT BONE GRAFT  H&P on 12/04/2021 [name]  HPI: 56 year old black male with history of multilevel lumbar stenosis comes in for prep evaluation.  States that symptoms unchanged from previous visit.  He is wanting to proceed with TRANFORAMINAL LUMBAR INTERBODY FUSIONS LEFT L3-4 AND LEFT L4-5 WITH REMOVAL OF HARDWARE L5-S1, POSTERIOR INSTRUMENTATION L3, L4, L5 AND S1, RODS SCREWS AND CAGES, LOCAL AND ALLOGRAFT BONE GRAFT, VIVIGEN as scheduled.  We have received preop cardiac clearance.  Today history and physical performed.  Review of systems positive for headaches that he has been told is related to ongoing issues with the cervical spine.  Has left trapezius spasm that radiates pain into the occipital  region..  Lumbar X-ray:  IMPRESSION: Fluoroscopic images show surgical fusion at L3-L4 and L4-L5 levels and removal of surgical hardware at L5-S1 level.  CT Lumbar Spine   IMPRESSION: 1. Postoperative changes from interval left-sided TLIF at L3-4 and L4-5, with hardware removal at L5-S1. No visible complication by CT. Improved thecal sac and left foraminal patency at L3-4 and L4-5. 2. Otherwise stable appearance of the lumbar spine. No other new or progressive finding. 3. 3 mm nonobstructive right renal nephrolithiasis. 4.  Aortic Atherosclerosis (ICD10-I70.0).   Aortic Atherosclerosis (ICD10-I70.0).  Scott Tucker was admitted to inpatient rehabilitation on 12/13/2021 and discharged home on 12/25/2021. He states he has pain in his lower back radiating into his bilateral lower extremities and bilateral lower pain R>L. He rates his pain 7. Also reports increase intensity and frequency of muscle spasms, Robaxin dose increase, he verbalizes understanding.   He also reports he has a good appetite.   He is receiving Home Health Therapy with [facility].    Pain Inventory Average Pain 9 Pain Right Now 7 My pain is constant, sharp, burning, tingling, and aching  In the last 24 hours, has pain interfered with the following? General activity 9 Relation with others 9 Enjoyment of life 9 What TIME of day is your pain at its worst? morning , daytime, evening, and night Sleep (in general) Poor  Pain is worse with: walking, bending, sitting, inactivity, standing, and some activites Pain improves with:  position changes Relief from Meds: 5  use a walker how many minutes can you walk? uknown ability to climb steps?  yes do you drive?  no Do  you have any goals in this area?  yes  disabled: date disabled 2022 I need assistance with the following:  dressing, bathing, meal prep, household duties, and shopping Do you have any goals in this area?   yes  weakness numbness tremor tingling trouble walking spasms dizziness confusion depression anxiety  Any changes since last visit?  no  Any changes since last visit?  no    Family History  Problem Relation Age of Onset   Heart disease Maternal Uncle    Social History   Socioeconomic History   Marital status: Single    Spouse name: Not on file   Number of children: 2   Years of education: Not on file   Highest education level: Not on file  Occupational History   Not on file  Tobacco Use   Smoking status: Never   Smokeless tobacco: Never  Vaping Use   Vaping Use: Never used  Substance and Sexual Activity   Alcohol use: Yes    Alcohol/week: 3.0 standard drinks of alcohol    Types: 3 Glasses of wine per week   Drug use: Yes    Types: Marijuana    Comment: smokes mostly every day   Sexual activity: Not Currently  Other Topics Concern   Not on file  Social History Narrative   Not on file   Social Determinants of Health   Financial Resource Strain: Not on file  Food Insecurity: Not on file  Transportation Needs: Not on file  Physical Activity: Not on file  Stress: Not on file  Social Connections: Not on file   Past Surgical History:  Procedure Laterality Date   ANTERIOR CERVICAL DECOMP/DISCECTOMY FUSION N/A 12/04/2020   Procedure: ANTERIOR CERVICAL DISCECTOMY FUSION C3-4, C4-5, ALLOGRAFT, PLATE;  Surgeon: Eldred Manges, MD;  Location: [facility] OR;  Service: Orthopedics;  Laterality: N/A;  needs RNFA   BACK SURGERY     1992 and 2003(dr kritzer)   CARDIAC CATHETERIZATION     SHOULDER SURGERY Right    2017   TONSILLECTOMY     removed as a child   Past Medical History:  Diagnosis Date   Anginal pain (HCC)    Anxiety    Arthritis    Chronic back pain    Coronary artery disease    Diabetes mellitus without complication (HCC)    type 2   Dyspnea    Headache    HTN (hypertension)    Neuromuscular disorder (HCC)    Ht 5\' 6"  (1.676 m)   Wt 180 lb (81.6  kg)   BMI 29.05 kg/m   Opioid Risk Score:   Fall Risk Score:  `1  Depression screen [employer] 2/9     10/25/2021    8:38 AM 09/11/2021    9:10 AM 09/06/2021    8:43 AM 04/26/2021    8:30 AM 01/25/2021   11:03 AM 12/20/2020    3:57 PM 10/27/2020    8:43 AM  Depression screen PHQ 2/9  Decreased Interest 2 3 0 1 2 2 3   Down, Depressed, Hopeless 2 0 0 2 2 2 3   PHQ - 2 Score 4 3 0 3 4 4 6   Altered sleeping 3 3 3  3 3 3   Tired, decreased energy 1 2 3 3 3 1 2   Change in appetite 0 0 0 0 2 2 3   Feeling bad or failure about yourself  0 0 0 0 1 1 3   Trouble concentrating 3 3 3 3 3 3  3  Moving slowly or fidgety/restless 1 1 0 0 2 2 3   Suicidal thoughts 0 0 0 0 1 0 0  PHQ-9 Score 12 12 9  19 16 23   Difficult doing work/chores Extremely dIfficult   Somewhat difficult Very difficult Very difficult     Review of Systems  Musculoskeletal:  Positive for back pain, gait problem and neck pain.       Pain in both legs  Neurological:  Positive for dizziness, tremors, weakness and numbness.       Tingling, spasms  Psychiatric/Behavioral:  Positive for confusion.        Depression, anxiety  All other systems reviewed and are negative.      Objective:   Physical Exam Vitals and nursing note reviewed.  Constitutional:      Appearance: Normal appearance.  Cardiovascular:     Rate and Rhythm: Normal rate and regular rhythm.     Pulses: Normal pulses.     Heart sounds: Normal heart sounds.  Pulmonary:     Effort: Pulmonary effort is normal.     Breath sounds: Normal breath sounds.  Musculoskeletal:     Cervical back: Normal range of motion and neck supple.     Comments: Normal Muscle Bulk and Muscle Testing Reveals:  Upper Extremities: Full ROM and Muscle Strength 5/5  Lumbar Hypersensitivity Lower Extremities: Decreased ROM and Muscle Strength 4/5 Arises from Table slowly using walker for support Antalgic  Gait     Skin:    General: Skin is warm and dry.     Comments: Right Lower Extremity  with Skin discoloration and temperature changes noted to bilateral lower extremity. Right lower extremity cool temperature with palpation   Neurological:     General: No focal deficit present.     Mental Status: He is alert and oriented to person, place, and time.  Psychiatric:        Mood and Affect: Mood normal.        Behavior: Behavior normal.         Assessment & Plan:  S/P Lumbar Fusion, Lumbar Stenosis: S/P  12/04/2021 Dr Louanne Skye: Has a scheduled appointment with Dr Louanne Skye .  TRANFORAMINAL LUMBAR INTERBODY FUSIONS LEFT LUMBAR THREE - FOUR AND LEFT LUMBAR FOUR - FIVE WITH REMOVAL OF HARDWARE LUMBAR FIVE -SACRAL ONE, POSTERIOR INSTRUMENTATION LUMBAR THREE, LUMBAR FOUR, LUMBAR FIVE AND SACRAL ONE, RODS SCREWS AND CAGES, LOCAL AND ALLOGRAFT BONE GRAFT N  Continue Slow weaning Methadone: 2.5 mg BID. Instructed to send a MY Chart message on Friday with update on medication change.  Refilled: Oxycodone 15 mg one tablet every 6 hours as needed for pain #120.  2.Neuropathic Pain; continue Pregablin  and Cymbalta. Continue to Monitor.  3. Muscle Spasm,: Robaxin increased to 750 mg 4 times a day as needed , send a My Chart on Friday with update, he verbalizes understanding.  4. Essential Hypertension: Continue current medication regimen. Has a scheduled appointment with PCP. Continue to monitor.  5. Hyperlipidemia: Continue current medication regimen. PCP following. Continue to monitor.  6. Type Two DM.: Continue Current Medication PCP following. 7. Bilateral Lower Extremity Pain: R>L: RX Venous Dopplers. He has a scheduled appointment on 01/02/2022  F/U with Dr Curlene Dolphin in 4- 6 weeks

## 2022-01-01 NOTE — Patient Instructions (Addendum)
Take Methadone 1/2 tablet ( 2.5 mg ) one tablet every 12 hours   Continue Oxycodone as prescribed .   Robaxin ( Methocarbamol ) increase to 1/12 tablets = 750 mg  every 6 hours as needed.  Send a My-Chart Message on Friday with Update on Medication Change  Keep a Pain Journal

## 2022-01-02 ENCOUNTER — Ambulatory Visit (HOSPITAL_COMMUNITY)
Admission: RE | Admit: 2022-01-02 | Discharge: 2022-01-02 | Disposition: A | Discharge status: Home / Self Care | Payer: Medicare Other | Source: Ambulatory Visit | Attending: Registered Nurse | Admitting: Registered Nurse

## 2022-01-02 ENCOUNTER — Telehealth: Payer: Self-pay

## 2022-01-02 ENCOUNTER — Telehealth (INDEPENDENT_AMBULATORY_CARE_PROVIDER_SITE_OTHER): Payer: Self-pay | Admitting: Primary Care

## 2022-01-02 DIAGNOSIS — M79604 Pain in right leg: Secondary | ICD-10-CM | POA: Insufficient documentation

## 2022-01-02 NOTE — Telephone Encounter (Signed)
The vascular lab called and stated the doppler was negative for DVT in both legs

## 2022-01-02 NOTE — Telephone Encounter (Signed)
Verbal order for OT left on voicemail for Melissa. Please see encounter note from today regarding patient request for glucometer and no longer wanting home health but wants therapy.

## 2022-01-02 NOTE — Progress Notes (Signed)
Lower extremity venous has been completed.   Preliminary results in CV Proc.   Aundra Millet Latifa Noble 01/02/2022 2:16 PM

## 2022-01-02 NOTE — Telephone Encounter (Signed)
Misty Stanley RN with [facility] is calling in to update provider. She says that pt feels that he no longer need nursing home health service. Pt would like to have Therapy.    Pt is also like to request a glucometer to be sent in to his pharmacy.   Pharmacy: [facility]

## 2022-01-02 NOTE — Telephone Encounter (Signed)
Copied from CRM [id number]. Topic: General - Inquiry >> Dec 31, 2021  1:24 PM Marlow Baars wrote: Reason for CRM: Melissa with [facility] home health needs to get approval for verbal orders for OT for 1 x 6wks. Please assist further

## 2022-01-05 LAB — TOXASSURE SELECT,+ANTIDEPR,UR

## 2022-01-07 ENCOUNTER — Ambulatory Visit (INDEPENDENT_AMBULATORY_CARE_PROVIDER_SITE_OTHER): Payer: Self-pay | Admitting: *Deleted

## 2022-01-07 ENCOUNTER — Telehealth: Payer: Self-pay | Admitting: *Deleted

## 2022-01-07 NOTE — Telephone Encounter (Signed)
Triage summary routed to PCP for advise 

## 2022-01-07 NOTE — Telephone Encounter (Signed)
Patient patient HR elevated during physical therapy he was in pain prior to during and after. Reasonable for elevated HR also, review medicines which he was prescribed for pain methadone,  oxycodone and muscle relaxer states not helping advised to call ortho. PCP does not prescribe pain medication

## 2022-01-07 NOTE — Telephone Encounter (Signed)
Urine drug screen for this encounter is consistent for prescribed medication. However it is positive for THC.

## 2022-01-07 NOTE — Telephone Encounter (Signed)
  Chief Complaint: OT: Scott Tucker reporting elevated heart rate Symptoms: weakness- patient is post surgical Frequency: elevated today Pertinent Negatives: Patient denies dizziness, chest pain, sweating, difficulty breathing  Disposition: [] ED /[] Urgent Care (no appt availability in office) / [] Appointment(In office/virtual)/ []  [place] [facility]/ [] Home Care/ [] Refused Recommended Disposition /[] [place] [facility]/ []  Follow-up with PCP Additional Notes: Call to patient- patient states he is post surgical and having pain- he believes that is cause of elevated heart rate- patient reports he is not feeling abnormal rate or other symptoms. Patient reports his vitals are checked 2-3 times/week and no one has ever told him his pulse was elevated. Patient does have O2 sat reader and states he will monitor for elevation. He is presently resting and does not want to measure now. Reviewed symptoms and perimeter of 140 to call office. Will send note to see if patient needs to come to office sooner than scheduled appointment.

## 2022-01-07 NOTE — Telephone Encounter (Signed)
Summary: elevated heart rate   Caller OT w/ [facility] states pt resting heart rate at today's home visit  was142 and was able to lower it to 126 before she left   Caller requesting a return call to pt      Reason for Disposition . [1] Heart beating very rapidly (e.g., > 140 / minute) AND [2] not present now  (Exception: During exercise.)  Answer Assessment - Initial Assessment Questions 1. DESCRIPTION: "Please describe your heart rate or heartbeat that you are having" (e.g., fast/slow, regular/irregular, skipped or extra beats, "palpitations")     Patient reports he does not feel heart rate- is having pain 2. ONSET: "When did it start?" (Minutes, hours or days)      Reported today as elevated 3. DURATION: "How long does it last" (e.g., seconds, minutes, hours)     Unsure- patient states he has someone come to the house 2-3 times/week- has vitals checked and not told pulse was elevated 4. PATTERN "Does it come and go, or has it been constant since it started?"  "Does it get worse with exertion?"   "Are you feeling it now?"     unsure 5. TAP: "Using your hand, can you tap out what you are feeling on a chair or table in front of you, so that I can hear?" (Note: not all patients can do this)       *No Answer* 6. HEART RATE: "Can you tell me your heart rate?" "How many beats in 15 seconds?"  (Note: not all patients can do this)       *No Answer* 7. RECURRENT SYMPTOM: "Have you ever had this before?" If Yes, ask: "When was the last time?" and "What happened that time?"      *No Answer* 8. CAUSE: "What do you think is causing the palpitations?"     Pain 9. CARDIAC HISTORY: "Do you have any history of heart disease?" (e.g., heart attack, angina, bypass surgery, angioplasty, arrhythmia)      Heart disease  10. OTHER SYMPTOMS: "Do you have any other symptoms?" (e.g., dizziness, chest pain, sweating, difficulty breathing)       No other symptoms 11. PREGNANCY: "Is there any chance you are  pregnant?" "When was your last menstrual period?"       *No Answer*  Protocols used: Heart Rate and Heartbeat Questions-A-AH

## 2022-01-15 NOTE — Discharge Summary (Signed)
Patient ID: Scott Tucker MRN: [id number] DOB/AGE: 06-21-1965 56 y.o.  Admit date: 12/04/2021 Discharge date: 12/13/2021  Admission Diagnoses:  Principal Problem:   Spondylolisthesis, lumbar region Active Problems:   Degenerative disc disease, lumbar   Lumbar stenosis   Status post lumbar spinal fusion   Discharge Diagnoses:  Principal Problem:   Spondylolisthesis, lumbar region Active Problems:   Degenerative disc disease, lumbar   Lumbar stenosis   Status post lumbar spinal fusion  status post Procedure(s): TRANFORAMINAL LUMBAR INTERBODY FUSIONS LEFT LUMBAR THREE - FOUR AND LEFT LUMBAR FOUR - FIVE WITH REMOVAL OF HARDWARE LUMBAR FIVE -SACRAL ONE, POSTERIOR INSTRUMENTATION LUMBAR THREE, LUMBAR FOUR, LUMBAR FIVE AND SACRAL ONE, RODS SCREWS AND CAGES, LOCAL AND ALLOGRAFT BONE GRAFT  Past Medical History:  Diagnosis Date   Anginal pain (HCC)    Anxiety    Arthritis    Chronic back pain    Coronary artery disease    Diabetes mellitus without complication (HCC)    type 2   Dyspnea    Headache    HTN (hypertension)    Neuromuscular disorder (HCC)     Surgeries: Procedure(s): TRANFORAMINAL LUMBAR INTERBODY FUSIONS LEFT LUMBAR THREE - FOUR AND LEFT LUMBAR FOUR - FIVE WITH REMOVAL OF HARDWARE LUMBAR FIVE -SACRAL ONE, POSTERIOR INSTRUMENTATION LUMBAR THREE, LUMBAR FOUR, LUMBAR FIVE AND SACRAL ONE, RODS SCREWS AND CAGES, LOCAL AND ALLOGRAFT BONE GRAFT on 12/04/2021   Consultants:   Discharged Condition: Improved  Hospital Course: Scott Tucker is an 56 y.o. male who was admitted 12/04/2021 for operative treatment of Spondylolisthesis, lumbar region. Patient failed conservative treatments (please see the history and physical for the specifics) and had severe unremitting pain that affects sleep, daily activities and work/hobbies. After pre-op clearance, the patient was taken to the operating room on 12/04/2021 and underwent  Procedure(s): TRANFORAMINAL LUMBAR INTERBODY FUSIONS  LEFT LUMBAR THREE - FOUR AND LEFT LUMBAR FOUR - FIVE WITH REMOVAL OF HARDWARE LUMBAR FIVE -SACRAL ONE, POSTERIOR INSTRUMENTATION LUMBAR THREE, LUMBAR FOUR, LUMBAR FIVE AND SACRAL ONE, RODS SCREWS AND CAGES, LOCAL AND ALLOGRAFT BONE GRAFT.    Patient was given perioperative antibiotics:  Anti-infectives (From admission, onward)    Start     Dose/Rate Route Frequency Ordered Stop   12/04/21 2000  ceFAZolin (ANCEF) IVPB 2g/100 mL premix        2 g 200 mL/hr over 30 Minutes Intravenous Every 8 hours 12/04/21 1545 12/05/21 0515   12/04/21 1316  vancomycin (VANCOCIN) powder  Status:  Discontinued          As needed 12/04/21 1316 12/04/21 1407   12/04/21 0600  ceFAZolin (ANCEF) IVPB 2g/100 mL premix        2 g 200 mL/hr over 30 Minutes Intravenous On call to O.R. 12/04/21 0543 12/04/21 1206        Patient was given sequential compression devices and early ambulation to prevent DVT.   Patient benefited maximally from hospital stay and there were no complications. At the time of discharge, the patient was urinating/moving their bowels without difficulty, tolerating a regular diet, pain is controlled with oral pain medications and they have been cleared by PT/OT.   Recent vital signs: No data found.   Recent laboratory studies: No results for input(s): "WBC", "HGB", "HCT", "PLT", "NA", "K", "CL", "CO2", "BUN", "CREATININE", "GLUCOSE", "INR", "CALCIUM" in the last 72 hours.  Invalid input(s): "PT", "2"   Discharge Medications:   Allergies as of 12/13/2021       Reactions   Latex  Rash        Medication List     STOP taking these medications    aspirin EC 81 MG tablet       TAKE these medications    nitroGLYCERIN 0.4 MG SL tablet Commonly known as: NITROSTAT Place 1 tablet (0.4 mg total) under the tongue every 5 (five) minutes as needed for chest pain.        Diagnostic Studies: VAS [place] LOWER EXTREMITY VENOUS (DVT)  Result Date: 01/02/2022  Lower Venous DVT Study Patient  Name:  Scott Tucker  Date of Exam:   01/02/2022 Medical Rec #: [id number]        Accession #:    [id number] Date of Birth: 12-11-1965        Patient Gender: M Patient Age:   84 years Exam Location:  [facility] Procedure:      VAS [place] LOWER EXTREMITY VENOUS (DVT) Referring Phys: Jacalyn Lefevre --------------------------------------------------------------------------------  Indications: Pain.  Comparison Study: 02/23/21 prior Performing Technologist: [place] Ponder RVS  Examination Guidelines: A complete evaluation includes B-mode imaging, spectral Doppler, color Doppler, and power Doppler as needed of all accessible portions of each vessel. Bilateral testing is considered an integral part of a complete examination. Limited examinations for reoccurring indications may be performed as noted. The reflux portion of the exam is performed with the patient in reverse Trendelenburg.  +---------+---------------+---------+-----------+----------+--------------+ RIGHT    CompressibilityPhasicitySpontaneityPropertiesThrombus Aging +---------+---------------+---------+-----------+----------+--------------+ CFV      Full           Yes      Yes                                 +---------+---------------+---------+-----------+----------+--------------+ SFJ      Full                                                        +---------+---------------+---------+-----------+----------+--------------+ FV Prox  Full                                                        +---------+---------------+---------+-----------+----------+--------------+ FV Mid   Full                                                        +---------+---------------+---------+-----------+----------+--------------+ FV DistalFull                                                        +---------+---------------+---------+-----------+----------+--------------+ PFV      Full                                                         +---------+---------------+---------+-----------+----------+--------------+  POP      Full           Yes      Yes                                 +---------+---------------+---------+-----------+----------+--------------+ PTV      Full                                                        +---------+---------------+---------+-----------+----------+--------------+ PERO     Full                                                        +---------+---------------+---------+-----------+----------+--------------+   +---------+---------------+---------+-----------+----------+--------------+ LEFT     CompressibilityPhasicitySpontaneityPropertiesThrombus Aging +---------+---------------+---------+-----------+----------+--------------+ CFV      Full           Yes      Yes                                 +---------+---------------+---------+-----------+----------+--------------+ SFJ      Full                                                        +---------+---------------+---------+-----------+----------+--------------+ FV Prox  Full                                                        +---------+---------------+---------+-----------+----------+--------------+ FV Mid   Full                                                        +---------+---------------+---------+-----------+----------+--------------+ FV DistalFull                                                        +---------+---------------+---------+-----------+----------+--------------+ PFV      Full                                                        +---------+---------------+---------+-----------+----------+--------------+ POP      Full           Yes      Yes                                 +---------+---------------+---------+-----------+----------+--------------+  PTV      Full                                                         +---------+---------------+---------+-----------+----------+--------------+ PERO     Full                                                        +---------+---------------+---------+-----------+----------+--------------+     Summary: BILATERAL: - No evidence of deep vein thrombosis seen in the lower extremities, bilaterally. -No evidence of popliteal cyst, bilaterally.   *See table(s) above for measurements and observations. Electronically signed by Coral Else MD on 01/02/2022 at 8:29:07 PM.    Final    DG Lumbar Spine 2-3 Views  Result Date: 12/20/2021 CLINICAL DATA:  Lumbar fusion EXAM: LUMBAR SPINE - 2-3 VIEW COMPARISON:  12/07/2021 FINDINGS: Bilateral pedicle screw and interbody fusion L3-4 and L4-5. Hardware in satisfactory position and unchanged from the prior study. No fracture or complication. Mild anterolisthesis L4-5 and mild retrolisthesis L5-S1 unchanged. Interbody spacer L5-S1 unchanged in position. IMPRESSION: Bilateral pedicle screw and interbody fusion L3-4 L4-5 unchanged interbody spacer L5-S1 unchanged. No acute complication. Electronically Signed   By: Marlan Palau M.D.   On: 12/20/2021 15:46    Discharge Instructions     Call MD / Call 911   Complete by: As directed    If you experience chest pain or shortness of breath, CALL 911 and be transported to the hospital emergency room.  If you develope a fever above 101 F, pus (white drainage) or increased drainage or redness at the wound, or calf pain, call your surgeon's office.   Constipation Prevention   Complete by: As directed    Drink plenty of fluids.  Prune juice may be helpful.  You may use a stool softener, such as Colace (over the counter) 100 mg twice a day.  Use MiraLax (over the counter) for constipation as needed.   Diet - low sodium heart healthy   Complete by: As directed    Discharge instructions   Complete by: As directed    Call if there is increasing drainage, fever greater than 101.5, severe head  aches, and worsening nausea or light sensitivity. If shortness of breath, bloody cough or chest tightness or pain go to an emergency room. No lifting greater than 10 lbs. Avoid bending, stooping and twisting. Use brace when sitting and out of bed even to go to bathroom. Walk in house for first 2 weeks then may start to get out slowly increasing distances up to one mile by 4-6 weeks post op. After 5 days may shower and change dressing following bathing with shower.When bathing remove the brace shower and replace brace before getting out of the shower. If drainage, keep dry dressing and do not bathe the incision, use an moisture impervious dressing. Please call and return for scheduled follow up appointment 2 weeks from the time of surgery.   Driving restrictions   Complete by: As directed    No driving for 3 weeks   Increase activity slowly as tolerated   Complete by: As directed    Lifting restrictions   Complete  by: As directed    No lifting for 6 weeks   Post-operative opioid taper instructions:   Complete by: As directed    POST-OPERATIVE OPIOID TAPER INSTRUCTIONS: It is important to wean off of your opioid medication as soon as possible. If you do not need pain medication after your surgery it is ok to stop day one. Opioids include: Codeine, Hydrocodone(Norco, Vicodin), Oxycodone(Percocet, oxycontin) and hydromorphone amongst others.  Long term and even short term use of opiods can cause: Increased pain response Dependence Constipation Depression Respiratory depression And more.  Withdrawal symptoms can include Flu like symptoms Nausea, vomiting And more Techniques to manage these symptoms Hydrate well Eat regular healthy meals Stay active Use relaxation techniques(deep breathing, meditating, yoga) Do Not substitute Alcohol to help with tapering If you have been on opioids for less than two weeks and do not have pain than it is ok to stop all together.  Plan to wean off  of opioids This plan should start within one week post op of your joint replacement. Maintain the same interval or time between taking each dose and first decrease the dose.  Cut the total daily intake of opioids by one tablet each day Next start to increase the time between doses. The last dose that should be eliminated is the evening dose.           Follow-up Information     Kerrin Champagne, MD Follow up in 2 week(s).   Specialty: Orthopedic Surgery Why: For wound re-check, For suture removal Contact information: [address] [place] [address] [phone]                 Discharge Plan:  discharge to rehab  Disposition:     Signed: [name] for [name] MD 01/15/2022, 11:55 AM

## 2022-01-17 ENCOUNTER — Telehealth (INDEPENDENT_AMBULATORY_CARE_PROVIDER_SITE_OTHER): Payer: Self-pay | Admitting: Primary Care

## 2022-01-17 NOTE — Telephone Encounter (Signed)
A user error has taken place: encounter opened in error, closed for administrative reasons.

## 2022-01-25 ENCOUNTER — Ambulatory Visit (INDEPENDENT_AMBULATORY_CARE_PROVIDER_SITE_OTHER): Payer: Medicare Other | Admitting: Primary Care

## 2022-01-25 ENCOUNTER — Other Ambulatory Visit (HOSPITAL_COMMUNITY): Payer: Self-pay

## 2022-01-25 ENCOUNTER — Encounter (INDEPENDENT_AMBULATORY_CARE_PROVIDER_SITE_OTHER): Payer: Self-pay | Admitting: Primary Care

## 2022-01-25 ENCOUNTER — Telehealth (INDEPENDENT_AMBULATORY_CARE_PROVIDER_SITE_OTHER): Payer: Self-pay | Admitting: Primary Care

## 2022-01-25 VITALS — BP 139/88 | HR 68 | Resp 16 | Wt 189.2 lb

## 2022-01-25 DIAGNOSIS — Z760 Encounter for issue of repeat prescription: Secondary | ICD-10-CM | POA: Diagnosis not present

## 2022-01-25 DIAGNOSIS — G894 Chronic pain syndrome: Secondary | ICD-10-CM | POA: Diagnosis not present

## 2022-01-25 DIAGNOSIS — E782 Mixed hyperlipidemia: Secondary | ICD-10-CM | POA: Diagnosis not present

## 2022-01-25 DIAGNOSIS — E119 Type 2 diabetes mellitus without complications: Secondary | ICD-10-CM | POA: Diagnosis not present

## 2022-01-25 DIAGNOSIS — W19XXXA Unspecified fall, initial encounter: Secondary | ICD-10-CM

## 2022-01-25 DIAGNOSIS — L03213 Periorbital cellulitis: Secondary | ICD-10-CM

## 2022-01-25 DIAGNOSIS — I10 Essential (primary) hypertension: Secondary | ICD-10-CM

## 2022-01-25 LAB — POCT GLYCOSYLATED HEMOGLOBIN (HGB A1C): HbA1c, POC (controlled diabetic range): 5.6 % (ref 0.0–7.0)

## 2022-01-25 LAB — GLUCOSE, POCT (MANUAL RESULT ENTRY): POC Glucose: 148 mg/dl — AB (ref 70–99)

## 2022-01-25 MED ORDER — CETIRIZINE HCL 10 MG PO TABS: 10.0000 mg | ORAL_TABLET | Freq: Every day | ORAL | 0 refills | Status: DC

## 2022-01-25 MED ORDER — ERGOCALCIFEROL 1.25 MG (50000 UT) PO CAPS: 50000.0000 [IU] | ORAL_CAPSULE | ORAL | 0 refills | Status: DC

## 2022-01-25 MED ORDER — LOSARTAN POTASSIUM 25 MG PO TABS: 25.0000 mg | ORAL_TABLET | Freq: Every day | ORAL | 0 refills | Status: DC

## 2022-01-25 MED ORDER — EMPAGLIFLOZIN 25 MG PO TABS: 25.0000 mg | ORAL_TABLET | Freq: Every day | ORAL | 0 refills | Status: DC

## 2022-01-25 MED ORDER — ATORVASTATIN CALCIUM 40 MG PO TABS: 40.0000 mg | ORAL_TABLET | Freq: Every day | ORAL | 0 refills | Status: DC

## 2022-01-25 MED ORDER — AMLODIPINE BESYLATE 10 MG PO TABS: 10.0000 mg | ORAL_TABLET | Freq: Every day | ORAL | 0 refills | Status: DC

## 2022-01-25 NOTE — Telephone Encounter (Signed)
Copied from CRM [id number]. Topic: Quick Communication - Home Health Verbal Orders >> Jan 25, 2022  9:26 AM De Blanch wrote: Caller/Agency: Leah[facility]  Callback Number: [phone] Requesting OT/PT/Skilled Nursing/Social Work/Speech Therapy: PT Frequency: 1w5

## 2022-01-25 NOTE — Progress Notes (Unsigned)
[facility]  Scott Tucker, is a 56 y.o. male  YYT:[id number]  EXN:[id number]  DOB - April 12, 1966        Subjective:   Scott Tucker is a 56 y.o. male here today for a follow up visit for the management of HTN -Patient has No headache, No chest pain, No abdominal pain - No Nausea, No new weakness tingling or numbness, No Cough - shortness of breath. Also, management of Diabetes he denies polyuria, polydipsia, polyphagia or vision changes.  He does have a complaint of he fell at home on yesterday's and landed on both knees.  His right knee has a laceration see picture warm and swollen.  No problems updated.  Allergies  Allergen Reactions   Latex Rash    Past Medical History:  Diagnosis Date   Anginal pain (HCC)    Anxiety    Arthritis    Chronic back pain    Coronary artery disease    Diabetes mellitus without complication (HCC)    type 2   Dyspnea    Headache    HTN (hypertension)    Neuromuscular disorder (HCC)     Current Outpatient Medications on File Prior to Visit  Medication Sig Dispense Refill   amLODipine (NORVASC) 10 MG tablet Take 1 tablet (10 mg total) by mouth once daily. 30 tablet 0   aspirin EC 81 MG tablet Take 1 tablet (81 mg total) by mouth daily. Swallow whole. 100 tablet 12   atorvastatin (LIPITOR) 40 MG tablet Take 1 tablet (40 mg total) by mouth daily. 30 tablet 0   camphor-menthol (SARNA) lotion Apply topically as needed for itching. 222 mL 0   cetirizine (ZYRTEC) 10 MG tablet Take 1 tablet (10 mg total) by mouth daily. 30 tablet 0   docusate sodium (COLACE) 100 MG capsule Take 1 capsule (100 mg total) by mouth 2 (two) times daily. 60 capsule 0   DULoxetine (CYMBALTA) 60 MG capsule Take 1 capsule (60 mg total) by mouth daily. 30 capsule 0   empagliflozin (JARDIANCE) 25 MG TABS tablet Take 1 tablet (25 mg total) by mouth once daily before breakfast. 30 tablet 0   ergocalciferol (VITAMIN D2) 1.25 MG (50000 UT) capsule Take 1  capsule (50,000 Units total) by mouth once a week. 12 capsule 0   losartan (COZAAR) 25 MG tablet Take 1 tablet (25 mg total) by mouth once daily. (Please make overdue appt with Dr. Eldridge Dace before anymore refills. Thank you 1st attempt) 30 tablet 0   methadone (DOLOPHINE) 5 MG tablet Take 1 tablet (5 mg total) by mouth every 12 (twelve) hours. 14 tablet 0   methocarbamol (ROBAXIN) 500 MG tablet Take 1 tablet (500 mg total) by mouth every 6 (six) hours as needed for muscle spasms. 120 tablet 1   nitroGLYCERIN (NITROSTAT) 0.4 MG SL tablet Place 1 tablet (0.4 mg total) under the tongue every 5 (five) minutes as needed for chest pain. 75 tablet 1   oxyCODONE (ROXICODONE) 15 MG immediate release tablet Take 1 tablet (15 mg total) by mouth every 6 (six) hours as needed. 120 tablet 0   pregabalin (LYRICA) 200 MG capsule Take 1 capsule (200 mg total) by mouth 3 (three) times daily. 90 capsule 0   triamcinolone cream (KENALOG) 0.1 % Apply topically 3 (three) times daily. 90 g 0   No current facility-administered medications on file prior to visit.    Objective:  BP 139/88   Pulse 68   Resp 16   Wt 189  lb 3.2 oz (85.8 kg)   SpO2 96%   BMI 30.54 kg/m    Exam General appearance : Awake, alert, not in any distress. Speech Clear. Not toxic looking HEENT: Atraumatic and Normocephalic, pupils equally reactive to light and accomodation Neck: Supple, no JVD. No cervical lymphadenopathy.  Chest: Good air entry bilaterally, no added sounds  CVS: S1 S2 regular, no murmurs.  Abdomen: Bowel sounds present, Non tender and not distended with no gaurding, rigidity or rebound. Extremities: B/L Lower Ext shows no edema, both legs are warm to touch Neurology: Awake alert, and oriented X 3, Non focal Skin: No Rash  Data Review Lab Results  Component Value Date   HGBA1C 7.4 (A) 10/25/2021   HGBA1C 6.0 01/25/2021   HGBA1C 6.1 (A) 10/27/2020    Assessment & Plan   Scott Tucker was seen today for hypertension and  diabetes.  Diagnoses and all orders for this visit:  Mixed hyperlipidemia  Healthy lifestyle diet of fruits vegetables fish nuts whole grains and low saturated fat . Foods high in cholesterol or liver, fatty meats,cheese, butter avocados, nuts and seeds, chocolate and fried foods.  Diabetes mellitus without complication (HCC) Monitor foods that are high in carbohydrates are the following rice, potatoes, breads, sugars, and pastas.  Reduction in the intake (eating) will assist in lowering your blood sugars.  -     POCT glucose (manual entry) -     POCT glycosylated hemoglobin (Hb A1C) 5.6 previously 7.4  -     Microalbumin / creatinine urine ratio Currently only on Jardiance 25 mg daily  Essential hypertension BP goal - < 130/80 Explained that having normal blood pressure is the goal and medications are helping to get to goal and maintain normal blood pressure. DIET: Limit salt intake, read nutrition labels to check salt content, limit fried and high fatty foods  Avoid using multisymptom OTC cold preparations that generally contain sudafed which can rise BP. Consult with pharmacist on best cold relief products to use for persons with HTN EXERCISE Discussed incorporating exercise such as walking - 30 minutes most days of the week and can do in 10 minute intervals     Fall, initial encounter   Cleaned with peroxide 1/2 water 3abt's oint and bandage discussed s/s of infection  Chronic pain syndrome Unclear why placed on methadone short course  Will defer to orth/ refer to pain management  Medication refill -     amLODipine (NORVASC) 10 MG tablet; Take 1 tablet (10 mg total) by mouth once daily. -     atorvastatin (LIPITOR) 40 MG tablet; Take 1 tablet (40 mg total) by mouth daily. -     cetirizine (ZYRTEC) 10 MG tablet; Take 1 tablet (10 mg total) by mouth daily. -     empagliflozin (JARDIANCE) 25 MG TABS tablet; Take 1 tablet (25 mg total) by mouth once daily before breakfast. -      ergocalciferol (VITAMIN D2) 1.25 MG (50000 UT) capsule; Take 1 capsule (50,000 Units total) by mouth once a week. -     losartan (COZAAR) 25 MG tablet; Take 1 tablet (25 mg total) by mouth once daily. (Please make overdue appt with Dr. Eldridge Dace before anymore refills. Thank you 1st attempt)   Patient have been counseled extensively about nutrition and exercise. Other issues discussed during this visit include: low cholesterol diet, weight control and daily exercise, foot care, annual eye examinations at Ophthalmology, importance of adherence with medications and regular follow-up. We also discussed long term complications  of uncontrolled diabetes and hypertension.   No follow-ups on file.  The patient was given clear instructions to go to ER or return to medical center if symptoms don't improve, worsen or new problems develop. The patient verbalized understanding. The patient was told to call to get lab results if they haven't heard anything in the next week.   This note has been created with Education officer, environmental. Any transcriptional errors are unintentional.   Grayce Sessions, NP 01/25/2022, 9:56 AM

## 2022-01-25 NOTE — Op Note (Signed)
12/04/2021  11:02 AM  PATIENT:  Scott Tucker  56 y.o. male  MRN: [id number]  OPERATIVE REPORT  PRE-OPERATIVE DIAGNOSIS:  lumbar spondylolisthesis Lumbar three - four and Lumbar four - five above Lumbar five - Sacral one fusion  POST-OPERATIVE DIAGNOSIS:  lumbar spondylolisthesis Lumbar three - four and Lumbar four - five above Lumbar five - Sacral one fusion  PROCEDURE:  Procedure(s): TRANFORAMINAL LUMBAR INTERBODY FUSIONS LEFT LUMBAR THREE - FOUR AND LEFT LUMBAR FOUR - FIVE WITH REMOVAL OF HARDWARE LUMBAR FIVE -SACRAL ONE, POSTERIOR INSTRUMENTATION LUMBAR THREE, LUMBAR FOUR, LUMBAR FIVE AND SACRAL ONE, RODS SCREWS AND CAGES, LOCAL AND ALLOGRAFT BONE GRAFT    SURGEON:  Jessy Oto, MD     ASSISTANT:  Benjiman Core, PA-C  (Present throughout the entire procedure and necessary for completion of procedure in a timely manner)     ANESTHESIA:  General,supplemented with local marcaine 0.5% 1:1 exparel 1.3% total 30cc. Dr. Tobias Alexander.     COMPLICATIONS:  None.   EBL: 650cc  CELL SAVER BLOOD RETURNED: 350CC  DRAINS: Foley to SD.    COMPONENTS:   Implant Name Type Inv. Item Serial No. Manufacturer Lot No. LRB No. Used Action  CAGE SABLE 10X26 15D - [id number] Cage CAGE SABLE 10X26 15D  [employer] [id number] N/A 1 Implanted  GRAFT BONE CANNULA VIVIGEN 3 - [id number] Bone Implant GRAFT BONE CANNULA VIVIGEN 3 [id number] [employer]  N/A 1 Implanted  GRAFT BONE CANNULA VIVIGEN 3 - [id number] Bone Implant GRAFT BONE CANNULA VIVIGEN 3 [id number] [employer]  N/A 1 Implanted  GRAFT BONE CANNULA VIVIGEN 3 - [id number] Bone Implant GRAFT BONE CANNULA VIVIGEN 3 [id number] [employer]  N/A 1 Implanted  GRAFT BONE CANNULA VIVIGEN 3 - [id number] Bone Implant GRAFT BONE CANNULA VIVIGEN 3 [id number] [employer]  N/A 1 Implanted  GRAFT BONE CANNULA VIVIGEN 3 - [id number] Bone Implant GRAFT BONE CANNULA VIVIGEN 3 [id number] [employer]  N/A 1 Implanted  CAGE SABLE  10X26 6-12 8D - [id number] Cage CAGE SABLE 10X26 6-12 8D  [employer] [id number] N/A 1 Implanted  SCREW MOD CREO 7.5X35 - [id number] Screw SCREW MOD CREO 7.5X35  [employer]  N/A 1 Implanted  SCREW MOD SD CREO 7.5X45 - [id number] Screw SCREW MOD SD CREO 7.5X45  [employer]  N/A 2 Implanted  SCREW MOD SD CREO 7.5X50 - [id number] Screw SCREW MOD SD CREO 7.5X50  [employer]  N/A 3 Implanted  CAP LOCKING THREADED - [id number] Cap CAP LOCKING THREADED  [employer]  N/A 6 Implanted  SCREW PA THRD CREO TULIP 5.5X4 - [id number] Head SCREW PA THRD CREO TULIP 5.5X4  [employer]  N/A 6 Implanted  ROD CREO 60MM - [id number] Rod ROD CREO 60MM  [employer]  N/A 1 Wasted  ROD 65MM SPINAL - [id number] Rod ROD 65MM SPINAL  [employer]  N/A 1 Wasted  ROD 70MM SPINAL - [id number] Rod ROD 70MM SPINAL  [employer]  N/A 1 Implanted  ROD 70MM SPINAL - [id number] Rod ROD 70MM SPINAL  [employer]  N/A 1 Wasted  ROD 75MM SPINAL - [id number] Rod ROD 75MM SPINAL  [employer]  N/A 1 Implanted    PROCEDURE: The patient was met in the holding area, and the appropriate lumbar levels left L4-5 and L3-4 identified and marked with an "X" and my initials. I had discussion with the patient in the preop holding area regarding a change of consent form.The fusion levels are reidentified as L4-5 and L3-4. Patient understands the  rationale to perform TLIFs at two levels to decompress the left L4-5 and L5-S1 lateral recess and foramenal stenosis and to allow for some improved correction of her overall kyphoscoliosis. The patient was then transported to OR and was placed under general anestheticwithout difficulty. The patient received appropriate preoperative antibiotic prophylaxis 3 gm Ancef.  Nursing staff inserted a Foley catheter under sterile conditions. The patient was then turned to a prone position using the [place] spine frame. PAS. all pressure points well padded the arms at the side to 90 90. Standard prep with  DuraPrep solution draped in the usual manner from the lower dorsal spine the mid sacral segment. Iodine Vi-Drape was used and the old incision scar was marked. Time-out procedure was called and correct. Skin in the midline between L2 and S1 was then infiltrated with local anesthesia, marcaine 1/2% 1:1 exparel 1.3% total 20 cc used. Incision was then made  extending from L3-S1  through the skin and subcutaneous layers down to the patient's lumbodorsal fascia and spinous processes. The incision then carried sharply excising the supraspinous ligament and then continuing the lateral aspect of the spinous processes of L2, L3, L4 and L5. Cobb elevator used to carefully elevate the paralumbar muscles off of the posterior elements using electrocautery carefully drilled bleeding and perform dissection of the muscle tissues of the preserving the facet capsule at the L2-3. Continuing the exposure out laterally to expose the lateral margin of the facet joint line at L2-3, L3-4 and L4-5 . Incision was carried in the midline down to the S1 level area bleeders controlled using electrocautery monopolar electrocautery.  The retained pedicle screws and rods at L5-S1 were then exposed and the caps removed then the rods bilateral at L5-S1 were removed. The pedicle screws at L5 and S1 bilaterally were then removed with screw driver. The screws at L5 measured for length and width and appropriate screws then places at the L5 level. The L3-4 and L4-5 levels identified by the presence of the retained hardware at L5-S1.  C-arm fluoroscopy was then brought into the field and using C-arm fluoroscopy then a hole made into the medial aspect of the pedicle of L3 observed in the pedicle using C arm at the 5 oclock position on the left L3 pedicle nerve probe initial entry was determined on fluoroscopy to be good position alignment so that a 4.55m tap was passed to 50 mm within the left L3 pedicle to a depth of nearly 50 mm observed on C-arm  fluoroscopy to be beyond the midpoint of the lumbar vertebra and then position alignment within the left L3 pedicle this was then removed and the pedicle channel probed demonstrating patency no sign of rupture the cortex of the pedicle. Tapping with a 4.5 mm screw tap then 7.5 mm tap then 7.5 mm x 50 mm screw shank was placed on the left side at the L3 level. C-arm fluoroscopy was then brought into the field and using C-arm fluoroscopy then a hole made into the posterior medial aspect of the pedicle of right L3 observed in the pedicle using ball tipped nerve hook and hockey stick nerve probe initial entry was determined on fluoroscopy to be good position alignment so that 4.5 mm tap was then used to tap the right L3 pedicle to a depth of nearly 50 mm observed on C-arm fluoroscopy to be beyond the midpoint of the lumbar vertebra and then position alignment within the right L3 pedicle this was then removed and the pedicle channel probed  demonstrating patency no sign of rupture the cortex of the pedicle. Tapping with a 6.5 mm screw tap then  7.5 mm x 50 mm screw was placed right L3 pedicle. C-arm fluoroscopy was then brought into the field and using C-arm fluoroscopy then a hole made into the posterior and medial aspect of the left pedicle of L4 observed in the pedicle using ball tipped nerve hook and hockey stick nerve probe initial entry was determined on fluoroscopy to be good position alignment so that a 4.5 mm tap was then used to tap the left L4 pedicle to a depth of nearly 45 mm observed on C-arm fluoroscopy to be beyond the posterior one third of the lumbar vertebra and good position alignment within the left L4 pedicle this was then removed and the pedicle channel probed demonstrating patency no sign of rupture the cortex of the pedicle. Tapping with a 6.5 mm screw tap then 7.5m x 45 mm screw shank was placed on the left side at the L4 level. The pedicle channel of L4 on the left probed demonstrating patency  no sign of rupture the cortex of the pedicle. Creo screw for fixation of this level was measured as 7.5 mm x 45 mm screw shank so  was placed on the left side at the L4 level. C-arm fluoroscopy was then brought into the field and using C-arm fluoroscopy then a hole made into the posterior and medial aspect of the right pedicle of L4 observed in the pedicle using ball tipped nerve hook and hockey stick nerve probe initial entry was determined on fluoroscopy to be good position alignment so that a 4.5 mm tap was then used to tap the right L4 pedicle to a depth of nearly 50 mm observed on C-arm fluoroscopy to be beyond the posterior one third of the lumbar vertebra and good position alignment within the right L4 pedicle this was then removed and the pedicle channel probed demonstrating patency no sign of rupture the cortex of the pedicle. Tapping with a 6.5 mm screw tap then 7.5 mm x 50 mm screw was placed on the right side at the L4 level. The pedicle channel of L4 on the right probed demonstrating patency no sign of rupture the cortex of the pedicle. Creo screw for fixation of this level was measured as 7.5 mm x 50 mm screw. C-arm fluoroscopy was then brought into the field and using C-arm fluoroscopy then the hole into the pedicle of left L5 was placed and observed with ball-tipped probe the L5 pedicle on this side was 7.5 mm x 40 mm. A 6.058mX 4070marefully passed down the center of the L5 pedicle to a depth of nearly 40 mm. Observed on C-arm fluoroscopy to be in good position alignment channel was probed with a ball-tipped probe ensure patency no sign of cortical disruption. Following tapping with a 6.5 mm tap and a 7.5 x 35 mm screw shank was placed on the left side pedicle at L5. C-arm fluoroscopy was used to localize the hole made in the medial aspect of the pedicle of L5 on the right localizing the pedicle within the spinal canal with nerve hook and hockey-stick nerve probe carefully passed down the center of  the L5 pedicle to a depth of nearly 40 mm. Observed on C-arm fluoroscopy to be in good position alignment channel was probed with a ball-tipped probe ensure predicle screw  to be placed on the right side at the L5 level following decompression and TLIFs..C-arm fluoroscopy  was used to localize the hole made in the lateral aspect of the pedicle of S1 on the right localizing the pedicle within the spinal canal with nerve hook and hockey-stick nerve probe re patency no sign of cortical disruption. Following tapping with a 4.5 mm and 6.5 mm taps a 7.5 x 45 mm screw was placed on the right side pedicle at L5.  Left lateral inferior spinous processes of  L4 and L3 were then resected down to the base the lamina at each segment Leksell rongeur used to resect inferior aspect of the lamina on the left side at the L4 level and partially on the right side at L4 The left facet of L4-5 and L3-4 were resected in order to decompress the left side of the lumbar thecal sac at  L4-5 and L3-4 and decompress the left L3, L4 and L5 neuroforamen. Osteotomes and 27m and 33mkerrisons were used for this portion of the decompression. Complete facetectomies were perform left L4-5 and L3-4 to provide for exposure of the left side L4-5 and L3-4 neuroforamen for ease of placement of TLIFs (transforaminal lumbar interbody fusion) at each level inferior portions of the lamina and pars were also resected first beginning with the Leksell rongeur and osteotomes and then resecting using 2 and 3 mm Kerrison. Continued laminectomy was carried out resecting the central portions of the lamina of L4 and L5 performing foraminotomies on the right side at the L3, L4 and L5 levels. The inferior articular process L4 and L3 were resected on the right side. The L5 nerve root identified bilaterally and the medial aspect of the L5 pedicle. Superior articular process of L5 was then resected from the right side further decompressing the right L5 nerve and providing  for exposure of the area just superior to the L5 pedicle for a placement of L4-5 cage. A large amount of hypertrophic ligmentum flavum was found impressing on the right lateral recesses at L4-5 and L3-4 and narrowing the respective L4 and L5 neuroforamen. Loupe magnification and headlight were used during this portion procedure.  Attention then turned to placement of the transforaminal lumbar interbody fusion cages. Using a Penfield 4 the right lateral aspect of the thecal sac at the L4-5 disc space was carefully freed up The thecal sac could then easily be retracted in the posterior lateral aspect of the L4-5 disc was exposed 15 blade scalpel used to incise the posterolateral disc and an osteotome used to resect a small portion of bone off the superior aspect of the posterior superior vertebral body of L5 in order to ease the entry into the L4-5 disc space. A  12m21merrison rongeur was then able to be introduced in the disc space debrided it was quite narrow. 7 mm dilator was used to dialate the L4-5 disc space on the right side attempts were made to dilate further the 8 mm, 9mm3md 10mm1me successful and using small curettes and the disc space was debrided of a moderate amount degenerative disc present in the endplates debrided to bleeding endplate bone. Shavers were inserted to trial the intervertebral disc space. An adjustable 6-12 mm x 26mm 21megree Globus Sable adjustable cage was carefully packed with morcellized bone graft and the been harvested from previous laminotomies.The cage was then inserted with the articulating insertion handle. The cage then expanded.  Additional bone graft was then packed through the handle with the funnel and sleeve using vivigen. Bleeding controlled using bipolar electrocautery thrombin soaked gel cottonoids. Then turned  to the left L3-4 level similarly the exposure the posterior lateral aspect this was carried out using a Penfield 4 bipolar electrocautery to control small  bleeders present. Derricho retractor used to retract the thecal sac and L3 nerve root a 15 blade scalpel was used to incise posterior lateral aspect of the left L3-4 disc the disc space at this level showed a rather severe narrowing posteriorly was more open anteriorly so that an osteotome again was used to resect a small portion the posterior superior lip of the vertebral body at L4  in order to gain ease of access into the L3-4 disc space. The space was debrided of degenerative disc material using pituitary along root the entire disc space was then debrided of degenerative disc material using pituitary rongeurs curettage down to bleeding bone endplates. 65m, 8 mm and 966mshavers were used to debride the disc space and pituitary ronguers used to remove the loosened debris. This space was then carefully assess using spacers  a 7-14 mm trial cage provided the best fit, the Globus Sable adjustable 8 degree lordotic cage 6-1272m 83m73ms chosen so that the adjustable SablRaymonde packed with local bone graft was placed into the intervertebral disc space. The cage then expanded. Additional bone graft then inserted into the cage via the funnel and tube inserted into the handle used to insert the catge. The posterior intervertebral disc space was then packed with autogenous local bone graft that been harvested from the central laminectomy. Bleeding controlled using bipolar electrocautery.  Observed on C-arm fluoroscopy to be in good position alignment. The cages at L4-5 and L3-4 were placed anteriorly as best as possible the correct patient's kyphosis that was present. With this then the transforaminal lumbar interbody fusion portion of the case was completed bleeders were controlled using bipolar electrocautery thrombin-soaked Gelfoam were appropriate.Decortication of the facet joints carried out right L4-5 and L3-4 facets and posterior lamina. These were packed with cancellous local bone graft. 3 pedicle screw shanks on  the left and the right L3, L4 and L5 pedicle screw already in place, the 3 Creo rod saddles(tuliop) were each placed and then each fastener carefully aligned  to allow for placement of rods. The left side first quarter inch titanium rod was then carefully contoured using the french benders and a precontoured 65 mm rod. This was then placed into the pedicle screws on the right extending from L3-L5 each of the caps were carefully placed loosely tightened. Attention turned to the left side were similarly and then screws were carefully adjusted to allow for a better pattern screws to allow for placement of fixation of the rod a quarter inch 75 mm precontoured titanium rod was then carefully contoured. This was able to be inserted into the left pedicle screw fasteners, Caps onto the L3 fasteners were tightened to 80 foot lbs. Across the left side  L3-4 and L4-5 screw fasteners compression was obtained on the left side between L3 and L4, then L4 and L5 by compressing between the fasteners and tightening the screw caps 85 pounds. Similarly this was done on the right side at L3-4 and L4-5 obtaining compression and tightened 85 pounds. Irrigation was carried out with copious amounts of saline solution this was done throughout the case. Cell Saver was used during the case. Hockey stick neuroprobe was used to probe the neuroforamen bilateral L3, L4 and L5, these were determined to be well decompressed. Permanent C-arm images were obtained in AP and lateral plane and oblique  planes. Remaining local bone graft was then applied along both lateral posterior lateral region extending from L3 to L5 facet beds.Gelfoam was then removed spinal canal. The lumbodorsal musculature carefully exam debrided of any devitalized tissue following removal of Viper retractors were the bleeders were controlled using electrocautery and the area dorsal lumbar muscle were then approximated in the midline with interrupted #1 Vicryl sutures loose the  dorsal fascia was reattached to the spinous process of L2 to superiorly and L5  inferiorly this was done with #1 Vicryl sutures. Subcutaneous layers then approximated using interrupted 0 Vicryl sutures and 2-0 Vicryl sutures. Skin was closed with stainless steel staples then MedPlex bandage. All instrument and sponge counts were correct. The patient was then returned to a supine position on her bed reactivated extubated and returned to the recovery room in satisfactory condition.   Benjiman Core PA-C perform the duties of assistant surgeon during this case.He was present from the beginning of the case to the end of the case assisting in transfer the patient from his stretcher to the OR table and back to the stretcher at the end of the case. Assisted in careful retraction and suction of the laminectomy site delicate neural structures operating under the operating room microscope. He performed closure of the incision from the fascia to the skin applying the dressing.         Basil Dess  01/25/2022, 11:02 AM

## 2022-01-25 NOTE — Telephone Encounter (Signed)
Courtney please f/u with patient     01/25/2022   10:38 AM 01/01/2022    2:58 PM 10/25/2021    8:38 AM 09/11/2021    9:10 AM 09/06/2021    8:43 AM  Depression screen PHQ 2/9  Decreased Interest 3 3 2 3  0  Down, Depressed, Hopeless 3 2 2  0 0  PHQ - 2 Score 6 5 4 3  0  Altered sleeping 3 3 3 3 3   Tired, decreased energy 2 0 1 2 3   Change in appetite 0 1 0 0 0  Feeling bad or failure about yourself  3 3 0 0 0  Trouble concentrating 3 1 3 3 3   Moving slowly or fidgety/restless 2 0 1 1 0  Suicidal thoughts 1  0 0 0  PHQ-9 Score 20 13 12 12 9   Difficult doing work/chores   Extremely dIfficult

## 2022-01-25 NOTE — Telephone Encounter (Signed)
    01/25/2022   10:38 AM 01/01/2022    2:58 PM 10/25/2021    8:38 AM  Depression screen PHQ 2/9  Decreased Interest 3 3 2   Down, Depressed, Hopeless 3 2 2   PHQ - 2 Score 6 5 4   Altered sleeping 3 3 3   Tired, decreased energy 2 0 1  Change in appetite 0 1 0  Feeling bad or failure about yourself  3 3 0  Trouble concentrating 3 1 3   Moving slowly or fidgety/restless 2 0 1  Suicidal thoughts 1  0  PHQ-9 Score 20 13 12   Difficult doing work/chores   Extremely dIfficult

## 2022-01-25 NOTE — Telephone Encounter (Signed)
Returned call and lvm giving verbal orders

## 2022-01-28 ENCOUNTER — Other Ambulatory Visit: Payer: Self-pay

## 2022-01-28 ENCOUNTER — Encounter: Payer: Medicare Other | Attending: Registered Nurse | Admitting: Physical Medicine & Rehabilitation

## 2022-01-28 ENCOUNTER — Telehealth (INDEPENDENT_AMBULATORY_CARE_PROVIDER_SITE_OTHER): Payer: Self-pay | Admitting: Primary Care

## 2022-01-28 VITALS — BP 144/89 | HR 77 | Ht 66.0 in | Wt 196.0 lb

## 2022-01-28 DIAGNOSIS — I10 Essential (primary) hypertension: Secondary | ICD-10-CM

## 2022-01-28 DIAGNOSIS — M62838 Other muscle spasm: Secondary | ICD-10-CM | POA: Insufficient documentation

## 2022-01-28 DIAGNOSIS — M48061 Spinal stenosis, lumbar region without neurogenic claudication: Secondary | ICD-10-CM | POA: Diagnosis not present

## 2022-01-28 DIAGNOSIS — M792 Neuralgia and neuritis, unspecified: Secondary | ICD-10-CM | POA: Diagnosis not present

## 2022-01-28 DIAGNOSIS — E782 Mixed hyperlipidemia: Secondary | ICD-10-CM

## 2022-01-28 DIAGNOSIS — L03213 Periorbital cellulitis: Secondary | ICD-10-CM

## 2022-01-28 DIAGNOSIS — Z760 Encounter for issue of repeat prescription: Secondary | ICD-10-CM

## 2022-01-28 MED ORDER — DULOXETINE HCL 60 MG PO CPEP
60.0000 mg | ORAL_CAPSULE | Freq: Every day | ORAL | 2 refills | Status: DC
  Filled 2022-01-28: qty 30, 30d supply, fill #0
  Filled 2022-02-08 – 2022-03-20 (×2): qty 30, 30d supply, fill #1

## 2022-01-28 NOTE — Telephone Encounter (Signed)
Pt saw Sharyn Lull and all his meds were sent to [facility].  Pt states he does not use [facility].  Would like you to re send them to  [place] [facility]  amLODipine (NORVASC) 10 MG tablet atorvastatin (LIPITOR) 40 MG tablet cetirizine (ZYRTEC) 10 MG tablet empagliflozin (JARDIANCE) 25 MG TABS tablet ergocalciferol (VITAMIN D2) 1.25 MG (50000 UT) capsule losartan (COZAAR) 25 MG tablet

## 2022-01-28 NOTE — Progress Notes (Addendum)
Subjective:    Patient ID: Scott Tucker, male    DOB: 1965-07-16, 56 y.o.   MRN: [id number]  HPI  Hospital HPI Brief HPI:   Scott Tucker is a 56 y.o. male with a history of lumbar spondylolisthesis with stenosis and chronic lower back pain who was admitted on 12/04/2021 for transforaminal fusion L3/4 and L4/5 by Dr. Otelia Sergeant.  Treated postoperatively with steroids which were tapered.  Continues with left thigh neuropathic pain and Lyrica titrated upward.     Hospital Course: Scott Tucker was admitted to rehab 12/13/2021 for inpatient therapies to consist of PT, ST and OT at least three hours five days a week. Past admission physiatrist, therapy team and rehab RN have worked together to provide customized collaborative inpatient rehab. Norco changed to oxycodone 5-10 mg every 4 hours to limit acetaminophen. Lyrica increased to 150 mg BID on 8/4. MS Contin 15 mg BID added. K-pad applied. Steroids were tapered. Vit D supplement weekly. Lyrica increased to TID dosing on 8/9 as well as starting duloxetine. Follow-up xrays stable on 8/10.  WBC trended downward with steroid taper. Incision healing nicely without signs of infection. Hypokalemia resolved with supplementation. Mildly pruritic skin rash on back noted. Improved with anti-histamines and Kenalog cream. Neuropsychologic consult obtained on 8/14.  Interval History  Scott Tucker is here for f/u after his lumbar fusion L3-4, L4-5 by Dr. Otelia Sergeant.  His greatest complaint is continued pain in his lower extremities.  He reports that he is having paresthesias on his anterior lateral thighs and legs and numbness in his posterior legs and thighs.  He continues to take the Lyrica 200 mg 3 times a day.  He has been out of his duloxetine and he also reports he was recently taking 30 mg of duloxetine instead of 60 mg because that is how it was sent to him from the pharmacy.  He was weaned off the methadone.  He has also used oxycodone 15 mg as needed.  Does not  use it frequently. He reports he still has some of this medication at home and while it provides benefit to his pain to does not reduce it to a comfortable level at current dose.   Pain Inventory Average Pain 5 Pain Right Now 9 My pain is sharp, burning, stabbing, tingling, and aching  In the last 24 hours, has pain interfered with the following? General activity 8 Relation with others 10 Enjoyment of life 10 What TIME of day is your pain at its worst? morning , daytime, evening, and night Sleep (in general) Poor  Pain is worse with: walking, bending, sitting, inactivity, standing, and some activites Pain improves with: medication Relief from Meds: 1  Family History  Problem Relation Age of Onset   Heart disease Maternal Uncle    Social History   Socioeconomic History   Marital status: Single    Spouse name: Not on file   Number of children: 2   Years of education: Not on file   Highest education level: Not on file  Occupational History   Not on file  Tobacco Use   Smoking status: Never   Smokeless tobacco: Never  Vaping Use   Vaping Use: Never used  Substance and Sexual Activity   Alcohol use: Yes    Alcohol/week: 3.0 standard drinks of alcohol    Types: 3 Glasses of wine per week   Drug use: Not Currently    Types: Marijuana    Comment: smokes mostly every day  Sexual activity: Not Currently  Other Topics Concern   Not on file  Social History Narrative   Not on file   Social Determinants of Health   Financial Resource Strain: Not on file  Food Insecurity: Not on file  Transportation Needs: Not on file  Physical Activity: Not on file  Stress: Not on file  Social Connections: Not on file   Past Surgical History:  Procedure Laterality Date   ANTERIOR CERVICAL DECOMP/DISCECTOMY FUSION N/A 12/04/2020   Procedure: ANTERIOR CERVICAL DISCECTOMY FUSION C3-4, C4-5, ALLOGRAFT, PLATE;  Surgeon: Marybelle Killings, MD;  Location: [place];  Service: Orthopedics;   Laterality: N/A;  needs [place] and 2003(dr kritzer)   CARDIAC CATHETERIZATION     SHOULDER SURGERY Right    2017   TONSILLECTOMY     removed as a child   Past Surgical History:  Procedure Laterality Date   ANTERIOR CERVICAL DECOMP/DISCECTOMY FUSION N/A 12/04/2020   Procedure: ANTERIOR CERVICAL DISCECTOMY FUSION C3-4, C4-5, ALLOGRAFT, PLATE;  Surgeon: Marybelle Killings, MD;  Location: [facility];  Service: Orthopedics;  Laterality: N/A;  needs [place] and 2003(dr kritzer)   CARDIAC CATHETERIZATION     SHOULDER SURGERY Right    2017   TONSILLECTOMY     removed as a child   Past Medical History:  Diagnosis Date   Anginal pain (HCC)    Anxiety    Arthritis    Chronic back pain    Coronary artery disease    Diabetes mellitus without complication (HCC)    type 2   Dyspnea    Headache    HTN (hypertension)    Neuromuscular disorder (HCC)    BP (!) 144/89   Pulse 77   Ht 5\' 6"  (1.676 m)   Wt 196 lb (88.9 kg)   SpO2 96%   BMI 31.64 kg/m   Opioid Risk Score:   Fall Risk Score:  `1  Depression screen [facility] 2/9     01/25/2022   10:38 AM 01/01/2022    2:58 PM 10/25/2021    8:38 AM 09/11/2021    9:10 AM 09/06/2021    8:43 AM 04/26/2021    8:30 AM 01/25/2021   11:03 AM  Depression screen PHQ 2/9  Decreased Interest 3 3 2 3  0 1 2  Down, Depressed, Hopeless 3 2 2  0 0 2 2  PHQ - 2 Score 6 5 4 3  0 3 4  Altered sleeping 3 3 3 3 3  3   Tired, decreased energy 2 0 1 2 3 3 3   Change in appetite 0 1 0 0 0 0 2  Feeling bad or failure about yourself  3 3 0 0 0 0 1  Trouble concentrating 3 1 3 3 3 3 3   Moving slowly or fidgety/restless 2 0 1 1 0 0 2  Suicidal thoughts 1  0 0 0 0 1  PHQ-9 Score 20 13 12 12 9  19   Difficult doing work/chores   Extremely dIfficult   Somewhat difficult Very difficult      Review of Systems  Musculoskeletal:  Positive for back pain.       Bilateral leg pain  All other systems reviewed and are negative.      Objective:   Physical Exam  Gen: no distress, normal appearing HEENT: oral mucosa pink and moist, NCAT Cardio: Reg rate Chest: normal effort, normal rate of breathing  Abd: soft, non-distended Ext: no edema Psych: flat affect Skin: intact Neuro: alert and oriented x4, follows commands, strength 5/5 in b/l UE Strength 4/5 in b/l LE, appears to be limited by pain Sensation to LT intact in all 4 extremities, Sensation altered /decreased is posterior legs  Musculoskeletal:   SLR negative b/l No joint swelling noted Normal muscle bulk and tone Antalgic gait     Assessment & Plan:  1. lumbar stenosis s/p lumbar fusion L3-4, L4-5 Dr. Otelia Sergeant  -Orthopedic surgery consult placed for follow-up after his surgery  2. Neuropathic pain  -Will order repeat MRI due to severe pain in legs  -Restart cymbalta 60mg  ordered  -Continue lyrica max dose  -Oxycodone not ordered today,  I later noted positive on toxicology for [facility] lab results 01/01/22, will need to discuss this with him if additional medication is reqested  3. Leg spasms   -Continue Robaxin as needed  Called 02/05/22 to f/u regarding secure message regarding oxycodone, left discrete VM

## 2022-01-29 ENCOUNTER — Telehealth (INDEPENDENT_AMBULATORY_CARE_PROVIDER_SITE_OTHER): Payer: Self-pay | Admitting: Primary Care

## 2022-01-29 ENCOUNTER — Ambulatory Visit: Payer: Self-pay

## 2022-01-29 ENCOUNTER — Ambulatory Visit (INDEPENDENT_AMBULATORY_CARE_PROVIDER_SITE_OTHER): Payer: Medicare Other | Admitting: Surgery

## 2022-01-29 ENCOUNTER — Encounter: Payer: Self-pay | Admitting: Physical Medicine & Rehabilitation

## 2022-01-29 ENCOUNTER — Encounter: Payer: Self-pay | Admitting: Surgery

## 2022-01-29 ENCOUNTER — Encounter (INDEPENDENT_AMBULATORY_CARE_PROVIDER_SITE_OTHER): Payer: Self-pay | Admitting: Primary Care

## 2022-01-29 DIAGNOSIS — G8929 Other chronic pain: Secondary | ICD-10-CM | POA: Diagnosis not present

## 2022-01-29 DIAGNOSIS — Z4789 Encounter for other orthopedic aftercare: Secondary | ICD-10-CM | POA: Diagnosis not present

## 2022-01-29 DIAGNOSIS — I2510 Atherosclerotic heart disease of native coronary artery without angina pectoris: Secondary | ICD-10-CM | POA: Diagnosis not present

## 2022-01-29 DIAGNOSIS — M4316 Spondylolisthesis, lumbar region: Secondary | ICD-10-CM | POA: Diagnosis not present

## 2022-01-29 DIAGNOSIS — F419 Anxiety disorder, unspecified: Secondary | ICD-10-CM | POA: Diagnosis not present

## 2022-01-29 DIAGNOSIS — R5082 Postprocedural fever: Secondary | ICD-10-CM | POA: Diagnosis not present

## 2022-01-29 DIAGNOSIS — E119 Type 2 diabetes mellitus without complications: Secondary | ICD-10-CM | POA: Diagnosis not present

## 2022-01-29 DIAGNOSIS — G709 Myoneural disorder, unspecified: Secondary | ICD-10-CM | POA: Diagnosis not present

## 2022-01-29 DIAGNOSIS — M48061 Spinal stenosis, lumbar region without neurogenic claudication: Secondary | ICD-10-CM | POA: Diagnosis not present

## 2022-01-29 DIAGNOSIS — I10 Essential (primary) hypertension: Secondary | ICD-10-CM | POA: Diagnosis not present

## 2022-01-29 DIAGNOSIS — M1990 Unspecified osteoarthritis, unspecified site: Secondary | ICD-10-CM | POA: Diagnosis not present

## 2022-01-29 DIAGNOSIS — M545 Low back pain, unspecified: Secondary | ICD-10-CM

## 2022-01-29 NOTE — Progress Notes (Signed)
Office Visit Note   Patient: Scott Tucker           Date of Birth: 1965/08/12           MRN: [id number] Visit Date: 01/29/2022              Requested by: Kerin Perna, NP [address] [place],  [place] [address] PCP: Kerin Perna, NP   Assessment & Plan: Visit Diagnoses:  1. Spondylolisthesis, lumbar region   2. Fever postop   3. Low back pain, unspecified back pain laterality, unspecified chronicity, unspecified whether sciatica present     Plan: Patient will continue wearing his brace.  Patient also proceed with the lumbar MRI that was ordered yesterday by the provider outside of our office.  Follow-up with Dr. Louanne Skye in 2 weeks for recheck and he can also review those results at that time.  Patient understands that improving will take time.    Follow-Up Instructions: Return in about 2 weeks (around 02/12/2022) for WITH DR NITKA.   Orders:  Orders Placed This Encounter  Procedures   XR Lumbar Spine 2-3 Views   CBC with Differential   Sed Rate (ESR)   C-reactive protein   No orders of the defined types were placed in this encounter.     Procedures: No procedures performed   Clinical Data: No additional findings.   Subjective: Chief Complaint  Patient presents with   Lower Back - Routine Post Op    HPI 56 year old male status post TRANFORAMINAL LUMBAR INTERBODY FUSIONS LEFT LUMBAR THREE - FOUR AND LEFT LUMBAR FOUR - FIVE WITH REMOVAL OF HARDWARE LUMBAR FIVE -SACRAL ONE December 04, 2021 by Dr. Louanne Skye returns.  Patient was discharged from [facility] about 4 weeks ago and this is his first appointment back at the office since his surgery.  States that he continues to have pain in both legs with numbness.  He is ambulating with a cane but states that he does use his walker some.  Has also been using his brace.  Patient was seen by [facility] health physical medicine and rehab physician yesterday who ordered a lumbar MRI.   Objective: Vital Signs: There were no  vitals taken for this visit.  Physical Exam Pleasant male alert and oriented in no acute stress.  Surgical incision lumbar is well-healed.  No gross weakness.    Ortho Exam  Specialty Comments:  No specialty comments available.  Imaging: No results found.   PMFS History: Patient Active Problem List   Diagnosis Date Noted   Depressive reaction    Leukocytosis    Neuropathic pain    Spondylolisthesis, lumbar region 12/04/2021    Class: Chronic   Degenerative disc disease, lumbar 12/04/2021   Lumbar stenosis 12/04/2021   Status post lumbar spinal fusion 12/04/2021   Spinal stenosis of lumbar region 05/30/2021   S/P cervical spinal fusion 12/18/2020   Chronic radicular cervical pain 05/01/2020   Foraminal stenosis of cervical region (right) 05/01/2020   Chronic pain syndrome 05/01/2020   History of lumbar fusion 05/01/2020   HTN (hypertension)    Diabetes mellitus without complication (HCC)    Chronic back pain    Coronary artery disease    CAD (coronary artery disease) 03/22/2019   Hypertension 02/23/2019   Hyperlipidemia 02/23/2019   Chest pain 02/22/2019   Past Medical History:  Diagnosis Date   Anginal pain (HCC)    Anxiety    Arthritis    Chronic back pain    Coronary  artery disease    Diabetes mellitus without complication (HCC)    type 2   Dyspnea    Headache    HTN (hypertension)    Neuromuscular disorder (HCC)     Family History  Problem Relation Age of Onset   Heart disease Maternal Uncle     Past Surgical History:  Procedure Laterality Date   ANTERIOR CERVICAL DECOMP/DISCECTOMY FUSION N/A 12/04/2020   Procedure: ANTERIOR CERVICAL DISCECTOMY FUSION C3-4, C4-5, ALLOGRAFT, PLATE;  Surgeon: Marybelle Killings, MD;  Location: [place] [facility];  Service: Orthopedics;  Laterality: N/A;  needs [place] and 2003(dr kritzer)   CARDIAC CATHETERIZATION     SHOULDER SURGERY Right    2017   TONSILLECTOMY     removed as a child   Social History    Occupational History   Not on file  Tobacco Use   Smoking status: Never   Smokeless tobacco: Never  Vaping Use   Vaping Use: Never used  Substance and Sexual Activity   Alcohol use: Yes    Alcohol/week: 3.0 standard drinks of alcohol    Types: 3 Glasses of wine per week   Drug use: Not Currently    Types: Marijuana    Comment: smokes mostly every day   Sexual activity: Not Currently

## 2022-01-29 NOTE — Telephone Encounter (Signed)
Wynema Birch calling to ask if the order for pt to be dc'd from home health nursing is ready.   Order no: [id number]  Cb [phone]  ext [phone] Fax [phone]

## 2022-01-29 NOTE — Telephone Encounter (Signed)
Will forward to provider  

## 2022-01-30 ENCOUNTER — Encounter: Payer: Self-pay | Admitting: Physical Medicine & Rehabilitation

## 2022-01-30 LAB — CBC WITH DIFFERENTIAL/PLATELET
Absolute Monocytes: 407 cells/uL (ref 200–950)
Basophils Absolute: 37 cells/uL (ref 0–200)
Basophils Relative: 0.5 %
Eosinophils Absolute: 81 cells/uL (ref 15–500)
Eosinophils Relative: 1.1 %
HCT: 43.5 % (ref 38.5–50.0)
Hemoglobin: 14.1 g/dL (ref 13.2–17.1)
Lymphs Abs: 2020 cells/uL (ref 850–3900)
MCH: 30 pg (ref 27.0–33.0)
MCHC: 32.4 g/dL (ref 32.0–36.0)
MCV: 92.6 fL (ref 80.0–100.0)
MPV: 9.1 fL (ref 7.5–12.5)
Monocytes Relative: 5.5 %
Neutro Abs: 4854 cells/uL (ref 1500–7800)
Neutrophils Relative %: 65.6 %
Platelets: 355 10*3/uL (ref 140–400)
RBC: 4.7 10*6/uL (ref 4.20–5.80)
RDW: 12.6 % (ref 11.0–15.0)
Total Lymphocyte: 27.3 %
WBC: 7.4 10*3/uL (ref 3.8–10.8)

## 2022-01-30 LAB — SEDIMENTATION RATE: Sed Rate: 2 mm/h (ref 0–20)

## 2022-01-30 LAB — C-REACTIVE PROTEIN: CRP: 2.3 mg/L (ref <8.0)

## 2022-02-04 ENCOUNTER — Other Ambulatory Visit: Payer: Self-pay

## 2022-02-04 ENCOUNTER — Other Ambulatory Visit (INDEPENDENT_AMBULATORY_CARE_PROVIDER_SITE_OTHER): Payer: Self-pay | Admitting: Primary Care

## 2022-02-04 DIAGNOSIS — Z760 Encounter for issue of repeat prescription: Secondary | ICD-10-CM

## 2022-02-04 DIAGNOSIS — I10 Essential (primary) hypertension: Secondary | ICD-10-CM

## 2022-02-04 DIAGNOSIS — E782 Mixed hyperlipidemia: Secondary | ICD-10-CM

## 2022-02-04 NOTE — Telephone Encounter (Signed)
Please send medication refills to  [facility] Phone:  [phone]  Fax:  [phone]    They were sent to [facility] by mistake.  amLODipine (NORVASC) 10 MG tablet atorvastatin (LIPITOR) 40 MG tablet cetirizine (ZYRTEC) 10 MG tablet empagliflozin (JARDIANCE) 25 MG TABS tablet ergocalciferol (VITAMIN D2) 1.25 MG (50000 UT) capsule losartan (COZAAR) 25 MG tablet

## 2022-02-05 ENCOUNTER — Other Ambulatory Visit: Payer: Self-pay

## 2022-02-05 MED ORDER — ERGOCALCIFEROL 1.25 MG (50000 UT) PO CAPS
50000.0000 [IU] | ORAL_CAPSULE | ORAL | 0 refills | Status: DC
  Filled 2022-02-05: qty 12, 84d supply, fill #0

## 2022-02-05 MED ORDER — CETIRIZINE HCL 10 MG PO TABS
10.0000 mg | ORAL_TABLET | Freq: Every day | ORAL | 0 refills | Status: DC
  Filled 2022-02-05 – 2022-02-08 (×2): qty 30, 30d supply, fill #0
  Filled 2022-03-20: qty 100, 100d supply, fill #0
  Filled 2022-05-14 – 2022-06-12 (×2): qty 90, 90d supply, fill #0

## 2022-02-05 MED ORDER — ATORVASTATIN CALCIUM 40 MG PO TABS
40.0000 mg | ORAL_TABLET | Freq: Every day | ORAL | 0 refills | Status: DC
  Filled 2022-02-05 – 2022-02-08 (×2): qty 30, 30d supply, fill #0

## 2022-02-05 MED ORDER — AMLODIPINE BESYLATE 10 MG PO TABS
10.0000 mg | ORAL_TABLET | Freq: Every day | ORAL | 0 refills | Status: DC
  Filled 2022-02-05 – 2022-02-08 (×2): qty 30, 30d supply, fill #0

## 2022-02-05 MED ORDER — EMPAGLIFLOZIN 25 MG PO TABS
25.0000 mg | ORAL_TABLET | Freq: Every day | ORAL | 0 refills | Status: DC
  Filled 2022-02-05 – 2022-02-08 (×2): qty 30, 30d supply, fill #0

## 2022-02-05 NOTE — Telephone Encounter (Signed)
Requested medication (s) are due for refill today: no  Requested medication (s) are on the active medication list: yes  Last refill:  01/25/22 #30  Future visit scheduled: no  Notes to clinic:  Called pt to make appt. Pt stated that he is overwhelmed having to go to so many appts. Advised pt to Call Dr Irish Lack to make appt.  T  Requested Prescriptions  Pending Prescriptions Disp Refills   losartan (COZAAR) 25 MG tablet 30 tablet     Sig: Take 1 tablet (25 mg total) by mouth once daily. (Please make overdue appt with Dr. Irish Lack before anymore refills. Thank you 1st attempt)     Cardiovascular:  Angiotensin Receptor Blockers Failed - 02/04/2022  5:34 PM      Failed - Last BP in normal range    BP Readings from Last 1 Encounters:  01/28/22 (!) 144/89         Passed - Cr in normal range and within 180 days    Creatinine, Ser  Date Value Ref Range Status  12/24/2021 0.70 0.61 - 1.24 mg/dL Final         Passed - K in normal range and within 180 days    Potassium  Date Value Ref Range Status  12/24/2021 4.4 3.5 - 5.1 mmol/L Final         Passed - Patient is not pregnant      Passed - Valid encounter within last 6 months    Recent Outpatient Visits           1 week ago Mixed hyperlipidemia   [place], Michelle P, NP   3 months ago Diabetes mellitus without complication ([place])   [place] Kerin Perna, NP   4 months ago Preseptal cellulitis of right eye   [facility], Cari S, PA-C   5 months ago Preseptal cellulitis of right eye   Primary Care at [facility], [place], PA-C   9 months ago Diabetes mellitus without complication ([place])   [place], Michelle P, NP       Future Appointments             In 6 days Jessy Oto, MD [place]   In 5 months Kerin Perna, NP [facility]             Signed Prescriptions Disp Refills   amLODipine (NORVASC) 10 MG tablet 30 tablet 0    Sig: Take 1 tablet (10 mg total) by mouth once daily.     Cardiovascular: Calcium Channel Blockers 2 Failed - 02/04/2022  5:34 PM      Failed - Last BP in normal range    BP Readings from Last 1 Encounters:  01/28/22 (!) 144/89         Passed - Last Heart Rate in normal range    Pulse Readings from Last 1 Encounters:  01/28/22 77         Passed - Valid encounter within last 6 months    Recent Outpatient Visits           1 week ago Mixed hyperlipidemia   [place] Juluis Mire P, NP   3 months ago Diabetes mellitus without complication ([place])   [place] Juluis Mire P, NP   4 months ago Preseptal cellulitis of right eye  [facility] at [place], PA-C   5 months ago Preseptal cellulitis of right eye   Primary Care at [facility], Cari S, PA-C   9 months ago Diabetes mellitus without complication [facility])   [place], Michelle P, NP       Future Appointments             In 6 days [facility], [facility]   In 5 months Kerin Perna, NP [facility]             atorvastatin (LIPITOR) 40 MG tablet 30 tablet 0    Sig: Take 1 tablet (40 mg total) by mouth daily.     Cardiovascular:  Antilipid - Statins Failed - 02/04/2022  5:34 PM      Failed - Lipid Panel in normal range within the last 12 months    Cholesterol, Total  Date Value Ref Range Status  10/25/2021 220 (H) 100 - 199 mg/dL Final   LDL Chol Calc (NIH)  Date Value Ref Range Status  10/25/2021 158 (H) 0 - 99 mg/dL Final   HDL  Date Value Ref Range Status  10/25/2021 44 >39 mg/dL Final   Triglycerides  Date Value Ref Range Status  10/25/2021 98 0 - 149 mg/dL Final         Passed - Patient is not pregnant      Passed - Valid encounter within  last 12 months    Recent Outpatient Visits           1 week ago Mixed hyperlipidemia   [place] Juluis Mire P, NP   3 months ago Diabetes mellitus without complication ([place])   [place], Michelle P, NP   4 months ago Preseptal cellulitis of right eye   Primary Care at [facility], [place], PA-C   5 months ago Preseptal cellulitis of right eye   [facility] at [facility], Cari S, PA-C   9 months ago Diabetes mellitus without complication ([place])   [facility] Kerin Perna, NP       Future Appointments             In 6 days Jessy Oto, MD [place]   In 5 months Kerin Perna, NP [facility]             cetirizine (ZYRTEC) 10 MG tablet 30 tablet 0    Sig: Take 1 tablet (10 mg total) by mouth daily.     Ear, Nose, and Throat:  Antihistamines 2 Passed - 02/04/2022  5:34 PM      Passed - Cr in normal range and within 360 days    Creatinine, Ser  Date Value Ref Range Status  12/24/2021 0.70 0.61 - 1.24 mg/dL Final         Passed - Valid encounter within last 12 months    Recent Outpatient Visits           1 week ago Mixed hyperlipidemia   [place] Juluis Mire P, NP   3 months ago Diabetes mellitus without complication ([place])   [place] Kerin Perna, NP   4 months ago Preseptal cellulitis of right eye   Primary Care at [facility], [place], PA-C   5  months ago Preseptal cellulitis of right eye   Primary Care at [facility], Cari S, PA-C   9 months ago Diabetes mellitus without complication ([place])   [place] Kerin Perna, NP       Future Appointments             In 6 days Jessy Oto, MD [facility] [place]   In 5 months Kerin Perna, NP [facility]              empagliflozin (JARDIANCE) 25 MG TABS tablet 30 tablet 0    Sig: Take 1 tablet (25 mg total) by mouth once daily before breakfast.     Endocrinology:  Diabetes - SGLT2 Inhibitors Passed - 02/04/2022  5:34 PM      Passed - Cr in normal range and within 360 days    Creatinine, Ser  Date Value Ref Range Status  12/24/2021 0.70 0.61 - 1.24 mg/dL Final         Passed - HBA1C is between 0 and 7.9 and within 180 days    HbA1c, POC (prediabetic range)  Date Value Ref Range Status  01/25/2021 6.0 5.7 - 6.4 % Final   HbA1c, POC (controlled diabetic range)  Date Value Ref Range Status  01/25/2022 5.6 0.0 - 7.0 % Final         Passed - eGFR in normal range and within 360 days    GFR calc Af Amer  Date Value Ref Range Status  12/22/2019 128 >59 mL/min/1.73 Final    Comment:    **[facility] currently reports eGFR in compliance with the current**   recommendations of the [employer]. [facility] will   update reporting as new guidelines are published from the NKF-ASN   Task force.    GFR, Estimated  Date Value Ref Range Status  12/24/2021 >60 >60 mL/min Final    Comment:    (NOTE) Calculated using the CKD-EPI Creatinine Equation (2021)    eGFR  Date Value Ref Range Status  10/25/2021 110 >59 mL/min/1.73 Final         Passed - Valid encounter within last 6 months    Recent Outpatient Visits           1 week ago Mixed hyperlipidemia   [place] Juluis Mire P, NP   3 months ago Diabetes mellitus without complication ([place])   [place] Kerin Perna, NP   4 months ago Preseptal cellulitis of right eye   Primary Care at [facility], Cari S, PA-C   5 months ago Preseptal cellulitis of right eye   Primary Care at [facility], [place], PA-C   9 months ago Diabetes mellitus without complication ([place])   [facility] Kerin Perna, NP       Future  Appointments             In 6 days Jessy Oto, MD [place]   In 5 months Kerin Perna, NP [facility]             ergocalciferol (VITAMIN D2) 1.25 MG (50000 UT) capsule 12 capsule 0    Sig: Take 1 capsule (50,000 Units total) by mouth once a week.     Endocrinology:  Vitamins - Vitamin D Supplementation 2 Failed - 02/04/2022  5:34 PM  Failed - Manual Review: Route requests for 50,000 IU strength to the provider      Failed - Vitamin D in normal range and within 360 days    Vit D, 25-Hydroxy  Date Value Ref Range Status  10/25/2021 10.3 (L) 30.0 - 100.0 ng/mL Final    Comment:    Vitamin D deficiency has been defined by the [place] practice guideline as a level of serum 25-OH vitamin D less than 20 ng/mL (1,2). The Endocrine Society went on to further define vitamin D insufficiency as a level between 21 and 29 ng/mL (2). 1. IOM (Institute of Medicine). 2010. Dietary reference    intakes for calcium and D. [place]: The    [employer]. 2. Holick MF, Binkley [place], Bischoff-Ferrari HA, et al.    Evaluation, treatment, and prevention of vitamin D    deficiency: an Endocrine Society clinical practice    guideline. JCEM. 2011 Jul; 96(7):1911-30.          Passed - Ca in normal range and within 360 days    Calcium  Date Value Ref Range Status  12/24/2021 9.0 8.9 - 10.3 mg/dL Final         Passed - Valid encounter within last 12 months    Recent Outpatient Visits           1 week ago Mixed hyperlipidemia   [place] Juluis Mire P, NP   3 months ago Diabetes mellitus without complication ([place])   [place] Kerin Perna, NP   4 months ago Preseptal cellulitis of right eye   Primary Care at [facility], [place], PA-C   5 months ago Preseptal cellulitis of right eye   Primary Care at [facility], [place], PA-C   9 months ago Diabetes mellitus without complication ([place])   [place], Michelle P, NP       Future Appointments             In 6 days Jessy Oto, MD [facility]   In 5 months Kerin Perna, NP [place]

## 2022-02-05 NOTE — Telephone Encounter (Signed)
Pt stated meds were sent to wrong parmacy  Requested Prescriptions  Pending Prescriptions Disp Refills  . amLODipine (NORVASC) 10 MG tablet 30 tablet 0    Sig: Take 1 tablet (10 mg total) by mouth once daily.     Cardiovascular: Calcium Channel Blockers 2 Failed - 02/04/2022  5:34 PM      Failed - Last BP in normal range    BP Readings from Last 1 Encounters:  01/28/22 (!) 144/89         Passed - Last Heart Rate in normal range    Pulse Readings from Last 1 Encounters:  01/28/22 77         Passed - Valid encounter within last 6 months    Recent Outpatient Visits          1 week ago Mixed hyperlipidemia   [place] Juluis Mire P, NP   3 months ago Diabetes mellitus without complication ([place])   [place] Kerin Perna, NP   4 months ago Preseptal cellulitis of right eye   Primary Care at [facility], [place], PA-C   5 months ago Preseptal cellulitis of right eye   Primary Care at [facility], [place], PA-C   9 months ago Diabetes mellitus without complication ([place])   [place], Michelle P, NP      Future Appointments            In 6 days Jessy Oto, MD [facility]   In 5 months Kerin Perna, NP [place]           . atorvastatin (LIPITOR) 40 MG tablet 30 tablet 0    Sig: Take 1 tablet (40 mg total) by mouth daily.     Cardiovascular:  Antilipid - Statins Failed - 02/04/2022  5:34 PM      Failed - Lipid Panel in normal range within the last 12 months    Cholesterol, Total  Date Value Ref Range Status  10/25/2021 220 (H) 100 - 199 mg/dL Final   LDL Chol Calc (NIH)  Date Value Ref Range Status  10/25/2021 158 (H) 0 - 99 mg/dL Final   HDL  Date Value Ref Range Status  10/25/2021 44 >39 mg/dL Final   Triglycerides  Date Value Ref Range Status  10/25/2021 98 0 - 149 mg/dL Final         Passed -  Patient is not pregnant      Passed - Valid encounter within last 12 months    Recent Outpatient Visits          1 week ago Mixed hyperlipidemia   [place], Michelle P, NP   3 months ago Diabetes mellitus without complication ([place])   [place] Kerin Perna, NP   4 months ago Preseptal cellulitis of right eye   Primary Care at [facility], [place], PA-C   5 months ago Preseptal cellulitis of right eye   Primary Care at [facility], [place], PA-C   9 months ago Diabetes mellitus without complication ([place])   [place], Michelle P, NP      Future Appointments            In 6 days Jessy Oto, MD [facility]   In 5 months Kerin Perna, NP  [facility]           . cetirizine (ZYRTEC) 10 MG tablet 30 tablet 0    Sig: Take 1 tablet (10 mg total) by mouth daily.     Ear, Nose, and Throat:  Antihistamines 2 Passed - 02/04/2022  5:34 PM      Passed - Cr in normal range and within 360 days    Creatinine, Ser  Date Value Ref Range Status  12/24/2021 0.70 0.61 - 1.24 mg/dL Final         Passed - Valid encounter within last 12 months    Recent Outpatient Visits          1 week ago Mixed hyperlipidemia   [place] Juluis Mire P, NP   3 months ago Diabetes mellitus without complication ([place])   [place] Kerin Perna, NP   4 months ago Preseptal cellulitis of right eye   Primary Care at [facility], [place], PA-C   5 months ago Preseptal cellulitis of right eye   Primary Care at [facility], [place], PA-C   9 months ago Diabetes mellitus without complication ([place])   [place], Michelle P, NP      Future Appointments            In 6 days Jessy Oto, MD [facility]   In 5 months Kerin Perna, NP [place]           . empagliflozin (JARDIANCE) 25 MG TABS tablet 30 tablet 0    Sig: Take 1 tablet (25 mg total) by mouth once daily before breakfast.     Endocrinology:  Diabetes - SGLT2 Inhibitors Passed - 02/04/2022  5:34 PM      Passed - Cr in normal range and within 360 days    Creatinine, Ser  Date Value Ref Range Status  12/24/2021 0.70 0.61 - 1.24 mg/dL Final         Passed - HBA1C is between 0 and 7.9 and within 180 days    HbA1c, POC (prediabetic range)  Date Value Ref Range Status  01/25/2021 6.0 5.7 - 6.4 % Final   HbA1c, POC (controlled diabetic range)  Date Value Ref Range Status  01/25/2022 5.6 0.0 - 7.0 % Final         Passed - eGFR in normal range and within 360 days    GFR calc Af Amer  Date Value Ref Range Status  12/22/2019 128 >59 mL/min/1.73 Final    Comment:    **[facility] currently reports eGFR in compliance with the current**   recommendations of the [employer]. [facility] will   update reporting as new guidelines are published from the NKF-ASN   Task force.    GFR, Estimated  Date Value Ref Range Status  12/24/2021 >60 >60 mL/min Final    Comment:    (NOTE) Calculated using the CKD-EPI Creatinine Equation (2021)    eGFR  Date Value Ref Range Status  10/25/2021 110 >59 mL/min/1.73 Final         Passed - Valid encounter within last 6 months    Recent Outpatient Visits          1 week ago Mixed hyperlipidemia   [place] Juluis Mire P, NP   3 months ago Diabetes mellitus without complication ([place])   [facility] Kerin Perna, NP   4 months ago Preseptal cellulitis of right eye   Primary Care at [facility], [place], PA-C   5 months ago Preseptal cellulitis of right eye   Primary Care at [facility], [place], PA-C   9 months ago Diabetes mellitus without complication [facility])   [place], Michelle P, NP      Future Appointments            In 6 days Jessy Oto, MD [facility]   In 5 months Kerin Perna, NP [employer]           . ergocalciferol (VITAMIN D2) 1.25 MG (50000 UT) capsule 12 capsule 0    Sig: Take 1 capsule (50,000 Units total) by mouth once a week.     Endocrinology:  Vitamins - Vitamin D Supplementation 2 Failed - 02/04/2022  5:34 PM      Failed - Manual Review: Route requests for 50,000 IU strength to the provider      Failed - Vitamin D in normal range and within 360 days    Vit D, 25-Hydroxy  Date Value Ref Range Status  10/25/2021 10.3 (L) 30.0 - 100.0 ng/mL Final    Comment:    Vitamin D deficiency has been defined by the [place] practice guideline as a level of serum 25-OH vitamin D less than 20 ng/mL (1,2). The Endocrine Society went on to further define vitamin D insufficiency as a level between 21 and 29 ng/mL (2). 1. IOM (Institute of Medicine). 2010. Dietary reference    intakes for calcium and D. [place]: The    [employer]. 2. Holick MF, Binkley Glascock, Bischoff-Ferrari HA, et al.    Evaluation, treatment, and prevention of vitamin D    deficiency: an Endocrine Society clinical practice    guideline. JCEM. 2011 Jul; 96(7):1911-30.          Passed - Ca in normal range and within 360 days    Calcium  Date Value Ref Range Status  12/24/2021 9.0 8.9 - 10.3 mg/dL Final         Passed - Valid encounter within last 12 months    Recent Outpatient Visits          1 week ago Mixed hyperlipidemia   [place] Juluis Mire P, NP   3 months ago Diabetes mellitus without complication ([place])   [place] Kerin Perna, NP   4 months ago Preseptal cellulitis of right eye   Primary Care at [facility], [place], PA-C   5 months ago Preseptal cellulitis of right  eye   Primary Care at [facility], [place], PA-C   9 months ago Diabetes mellitus without complication ([place])   [place], Michelle P, NP      Future Appointments            In 6 days Jessy Oto, MD [facility]   In 5 months Kerin Perna, NP [place]           . losartan (COZAAR) 25 MG tablet 30 tablet     Sig: Take 1 tablet (25 mg total) by mouth once daily. (Please make overdue appt with Dr. Irish Lack before anymore refills. Thank you 1st attempt)  Cardiovascular:  Angiotensin Receptor Blockers Failed - 02/04/2022  5:34 PM      Failed - Last BP in normal range    BP Readings from Last 1 Encounters:  01/28/22 (!) 144/89         Passed - Cr in normal range and within 180 days    Creatinine, Ser  Date Value Ref Range Status  12/24/2021 0.70 0.61 - 1.24 mg/dL Final         Passed - K in normal range and within 180 days    Potassium  Date Value Ref Range Status  12/24/2021 4.4 3.5 - 5.1 mmol/L Final         Passed - Patient is not pregnant      Passed - Valid encounter within last 6 months    Recent Outpatient Visits          1 week ago Mixed hyperlipidemia   [place], Michelle P, NP   3 months ago Diabetes mellitus without complication ([place])   [place] Kerin Perna, NP   4 months ago Preseptal cellulitis of right eye   Primary Care at [facility], [place], PA-C   5 months ago Preseptal cellulitis of right eye   Primary Care at [facility], [place], PA-C   9 months ago Diabetes mellitus without complication ([place])   [place], Michelle P, NP      Future Appointments            In 6 days Jessy Oto, MD [facility]   In 5 months Kerin Perna, NP [place]

## 2022-02-06 NOTE — Telephone Encounter (Signed)
[place] Screening    02/06/2022 Name: Scott Tucker MRN: [id number] DOB: 1966-01-04 Scott Tucker is a 56 y.o. year old male who sees Scott Perna, NP for primary care. LCSWA was consulted to assess mental health needs and assist the patient with Level of Care Concerns and Mental Health Counseling and Resources. Assessed thoughts of SI, plan and access to means.  Interpreter: No.     SUBJECTIVE: Presenting issue / symptoms/concerns: patient was referred for high depression and anxiety and having thought of suicide  Duration of symptoms/ how impacting : did not say  Recent life changes: medical illness Family / Social support: aunt   Mood: NA Affect: Flat  OBJECTIVE:  Psychiatric History - Diagnoses: Depression   GAD score of 14 is an indication of  moderate anxiety.  PHQ-9 score of 20 is an indication of severe depression.    01/25/2022   10:38 AM 01/01/2022    2:58 PM 10/25/2021    8:38 AM  Depression screen PHQ 2/9  Decreased Interest 3 3 2   Down, Depressed, Hopeless 3 2 2   PHQ - 2 Score 6 5 4   Altered sleeping 3 3 3   Tired, decreased energy 2 0 1  Change in appetite 0 1 0  Feeling bad or failure about yourself  3 3 0  Trouble concentrating 3 1 3   Moving slowly or fidgety/restless 2 0 1  Suicidal thoughts 1  0  PHQ-9 Score 20 13 12   Difficult doing work/chores   Extremely dIfficult        01/25/2022   10:38 AM 10/25/2021    8:38 AM 09/11/2021    9:10 AM 09/06/2021    8:43 AM  GAD 7 : Generalized Anxiety Score  Nervous, Anxious, on Edge 1 0 0 0  Control/stop worrying 1 0 0 0  Worry too much - different things 2 1 0 0  Trouble relaxing 3 3 3 3   Restless 3 3 3 3   Easily annoyed or irritable 3 3 3 3   Afraid - awful might happen 1 0 0 0  Total GAD 7 Score 14 10 9 9   Anxiety Difficulty  Very difficult      Outpatient Encounter Medications as of 01/25/2022  Medication Sig Note   aspirin EC 81 MG tablet Take 1 tablet (81  mg total) by mouth daily. Swallow whole.    camphor-menthol (SARNA) lotion Apply topically as needed for itching.    docusate sodium (COLACE) 100 MG capsule Take 1 capsule (100 mg total) by mouth 2 (two) times daily.    losartan (COZAAR) 25 MG tablet Take 1 tablet (25 mg total) by mouth once daily. (Please make overdue appt with Dr. Irish Lack before anymore refills. Thank you 1st attempt)    methadone (DOLOPHINE) 5 MG tablet Take 1 tablet (5 mg total) by mouth every 12 (twelve) hours. 01/01/2022: Last taken today. Not here for count.   methocarbamol (ROBAXIN) 500 MG tablet Take 1 tablet (500 mg total) by mouth every 6 (six) hours as needed for muscle spasms.    nitroGLYCERIN (NITROSTAT) 0.4 MG SL tablet Place 1 tablet (0.4 mg total) under the tongue every 5 (five) minutes as needed for chest pain.    oxyCODONE (ROXICODONE) 15 MG immediate release tablet Take 1 tablet (15 mg total) by mouth every 6 (six) hours as needed. 01/28/2022: Didn't bring bottle, LD 01/28/2022   pregabalin (LYRICA) 200 MG capsule Take 1 capsule (200 mg total) by mouth 3 (three)  times daily.    triamcinolone cream (KENALOG) 0.1 % Apply topically 3 (three) times daily.    [DISCONTINUED] amLODipine (NORVASC) 10 MG tablet Take 1 tablet (10 mg total) by mouth once daily.    [DISCONTINUED] atorvastatin (LIPITOR) 40 MG tablet Take 1 tablet (40 mg total) by mouth daily.    [DISCONTINUED] cetirizine (ZYRTEC) 10 MG tablet Take 1 tablet (10 mg total) by mouth daily.    [DISCONTINUED] DULoxetine (CYMBALTA) 60 MG capsule Take 1 capsule (60 mg total) by mouth daily.    [DISCONTINUED] empagliflozin (JARDIANCE) 25 MG TABS tablet Take 1 tablet (25 mg total) by mouth once daily before breakfast.    [DISCONTINUED] ergocalciferol (VITAMIN D2) 1.25 MG (50000 UT) capsule Take 1 capsule (50,000 Units total) by mouth once a week.    No facility-administered encounter medications on file as of 01/25/2022.    Review of patient status, including review of  consultants reports, relevant laboratory and other test results, and collaboration with appropriate care team members and the patient's provider was performed as part of comprehensive patient evaluation and provision of services.    Assessment: Patient is currently experiencing symptoms of depression and stress which are exacerbated by always being in pain, having an upcoming surgery and living with his aunt but eventually wanted his own place.   Recommendation: Patient may benefit from, and is in agreement to go to [facility] in the event he starts to have suicidal thoughts or feels more depression while taking the medication.   Intervention:Patient interviewed and appropriate assessments performed: brief mental health assessment to go to [place] in the event he starts to have suicidal thoughts or feels more depression while taking the medication Depression screen reviewed    SDOH (Social Determinants of Health) assessments performed: Yes @SDOHINTERVENTIONS @   Goals Addressed   None     Follow Up Plan:  1.patient did not report needing any other services right now .   Rosana Hoes, [place] and [facility]  Primary Care at [place] [facility] Direct Line: [phone] or [phone] Email: [email or web address]

## 2022-02-07 ENCOUNTER — Telehealth (INDEPENDENT_AMBULATORY_CARE_PROVIDER_SITE_OTHER): Payer: Self-pay | Admitting: Primary Care

## 2022-02-07 NOTE — Telephone Encounter (Signed)
Copied from [place]. Topic: Referral - Request for Referral >> Feb 07, 2022  3:01 PM Erskine Squibb wrote: Has patient seen PCP for this complaint? No Referral for which specialty: Aquatic therapy Preferred provider/office: [place] outpatient rehab at [facility] Reason for referral: Straigthening from back surgery he had in July.

## 2022-02-08 ENCOUNTER — Other Ambulatory Visit: Payer: Self-pay | Admitting: Physical Medicine and Rehabilitation

## 2022-02-08 ENCOUNTER — Telehealth: Payer: Self-pay | Admitting: *Deleted

## 2022-02-08 ENCOUNTER — Other Ambulatory Visit: Payer: Self-pay

## 2022-02-08 DIAGNOSIS — I10 Essential (primary) hypertension: Secondary | ICD-10-CM

## 2022-02-08 DIAGNOSIS — Z760 Encounter for issue of repeat prescription: Secondary | ICD-10-CM

## 2022-02-08 MED ORDER — PREGABALIN 200 MG PO CAPS: 200.0000 mg | ORAL_CAPSULE | Freq: Three times a day (TID) | ORAL | 0 refills | Status: DC

## 2022-02-08 MED ORDER — METHOCARBAMOL 500 MG PO TABS: 500.0000 mg | ORAL_TABLET | Freq: Four times a day (QID) | ORAL | 1 refills | Status: DC | PRN

## 2022-02-08 MED ORDER — OXYCODONE HCL 10 MG PO TABS: 10.0000 mg | ORAL_TABLET | Freq: Three times a day (TID) | ORAL | 0 refills | Status: AC | PRN

## 2022-02-08 NOTE — Telephone Encounter (Signed)
Will forward to provider  

## 2022-02-08 NOTE — Telephone Encounter (Signed)
Scott Tucker called for refills on his oxycodone , pregabalin, and methocarbamol. I let him know Dr Curlene Dolphin is not in the office but I will forward the message. I did tell him that Dr Curlene Dolphin may want to talk with him before filling oxycodone due to the [facility] in his UDS.

## 2022-02-09 DIAGNOSIS — Z008 Encounter for other general examination: Secondary | ICD-10-CM | POA: Diagnosis not present

## 2022-02-10 DIAGNOSIS — M792 Neuralgia and neuritis, unspecified: Secondary | ICD-10-CM | POA: Diagnosis not present

## 2022-02-10 DIAGNOSIS — M48061 Spinal stenosis, lumbar region without neurogenic claudication: Secondary | ICD-10-CM | POA: Diagnosis not present

## 2022-02-10 DIAGNOSIS — D72829 Elevated white blood cell count, unspecified: Secondary | ICD-10-CM | POA: Diagnosis not present

## 2022-02-11 ENCOUNTER — Encounter: Payer: Self-pay | Admitting: Specialist

## 2022-02-11 ENCOUNTER — Ambulatory Visit: Payer: Medicare Other | Admitting: Specialist

## 2022-02-11 ENCOUNTER — Ambulatory Visit (INDEPENDENT_AMBULATORY_CARE_PROVIDER_SITE_OTHER): Payer: Medicare Other | Admitting: Specialist

## 2022-02-11 ENCOUNTER — Other Ambulatory Visit: Payer: Self-pay

## 2022-02-11 VITALS — BP 168/99 | HR 87 | Ht 66.0 in | Wt 196.0 lb

## 2022-02-11 DIAGNOSIS — Z981 Arthrodesis status: Secondary | ICD-10-CM

## 2022-02-11 DIAGNOSIS — R29898 Other symptoms and signs involving the musculoskeletal system: Secondary | ICD-10-CM

## 2022-02-11 DIAGNOSIS — M4316 Spondylolisthesis, lumbar region: Secondary | ICD-10-CM

## 2022-02-11 MED ORDER — ALPRAZOLAM 0.5 MG PO TABS: ORAL_TABLET | ORAL | 0 refills | Status: DC

## 2022-02-11 NOTE — Progress Notes (Signed)
Post-Op Visit Note   Patient: Scott Tucker           Date of Birth: Jun 15, 1965           MRN: [id number] Visit Date: 02/11/2022 PCP: Grayce Sessions, NP   Assessment & Plan:2.5 months post op L3-4 and L4-5 left TLIF for spinal stenosis with congenital foramenal narrowing. Has bilateral hypesthesias medial thighs and calfs.   Chief Complaint:  Chief Complaint  Patient presents with   Lower Back - Follow-up  Bilateral leg numbness and tingling.  SLR is negative  Weak right more than left Quadriceps. Remainder intact. CT with right more than left foramenal narrowing L3 and L4  Visit Diagnoses: No diagnosis found.  Plan: Avoid frequent bending and stooping  No lifting greater than 10 lbs. May use ice or moist heat for pain. Weight loss is of benefit. Will start a pool exercise rehabilitation program. Presently with right quadriceps weakness. Obtain MRI to assess for residual foramenal narrowing L3 and L4.  Exercise is important to improve your indurance and does allow people to function better inspite of back pain.    Follow-Up Instructions: No follow-ups on file.   Orders:  No orders of the defined types were placed in this encounter.  No orders of the defined types were placed in this encounter.   Imaging: No results found.  PMFS History: Patient Active Problem List   Diagnosis Date Noted   Spondylolisthesis, lumbar region 12/04/2021    Priority: High    Class: Chronic   Degenerative disc disease, lumbar 12/04/2021    Priority: High   Depressive reaction    Leukocytosis    Neuropathic pain    Lumbar stenosis 12/04/2021   Status post lumbar spinal fusion 12/04/2021   Spinal stenosis of lumbar region 05/30/2021   S/P cervical spinal fusion 12/18/2020   Chronic radicular cervical pain 05/01/2020   Foraminal stenosis of cervical region (right) 05/01/2020   Chronic pain syndrome 05/01/2020   History of lumbar fusion 05/01/2020   HTN (hypertension)     Diabetes mellitus without complication (HCC)    Chronic back pain    Coronary artery disease    CAD (coronary artery disease) 03/22/2019   Hypertension 02/23/2019   Hyperlipidemia 02/23/2019   Chest pain 02/22/2019   Past Medical History:  Diagnosis Date   Anginal pain (HCC)    Anxiety    Arthritis    Chronic back pain    Coronary artery disease    Diabetes mellitus without complication (HCC)    type 2   Dyspnea    Headache    HTN (hypertension)    Neuromuscular disorder (HCC)     Family History  Problem Relation Age of Onset   Heart disease Maternal Uncle     Past Surgical History:  Procedure Laterality Date   ANTERIOR CERVICAL DECOMP/DISCECTOMY FUSION N/A 12/04/2020   Procedure: ANTERIOR CERVICAL DISCECTOMY FUSION C3-4, C4-5, ALLOGRAFT, PLATE;  Surgeon: Eldred Manges, MD;  Location: [facility] OR;  Service: Orthopedics;  Laterality: N/A;  needs RNFA   BACK SURGERY     1992 and 2003(dr kritzer)   CARDIAC CATHETERIZATION     SHOULDER SURGERY Right    2017   TONSILLECTOMY     removed as a child   Social History   Occupational History   Not on file  Tobacco Use   Smoking status: Never   Smokeless tobacco: Never  Vaping Use   Vaping Use: Never used  Substance and Sexual  Activity   Alcohol use: Yes    Alcohol/week: 3.0 standard drinks of alcohol    Types: 3 Glasses of wine per week   Drug use: Not Currently    Types: Marijuana    Comment: smokes mostly every day   Sexual activity: Not Currently

## 2022-02-11 NOTE — Telephone Encounter (Signed)
Pt states his back doctor told him it was okay and he would like pcp to place referral

## 2022-02-11 NOTE — Patient Instructions (Signed)
Plan: Avoid frequent bending and stooping  No lifting greater than 10 lbs. May use ice or moist heat for pain. Weight loss is of benefit. Will start a pool exercise rehabilitation program. Presently with right quadriceps weakness. Obtain MRI to assess for residual foramenal narrowing L3 and L4.  Exercise is important to improve your indurance and does allow people to function better inspite of back pain.

## 2022-02-14 ENCOUNTER — Other Ambulatory Visit (INDEPENDENT_AMBULATORY_CARE_PROVIDER_SITE_OTHER): Payer: Self-pay | Admitting: Primary Care

## 2022-02-14 DIAGNOSIS — M5136 Other intervertebral disc degeneration, lumbar region: Secondary | ICD-10-CM

## 2022-02-14 DIAGNOSIS — M4316 Spondylolisthesis, lumbar region: Secondary | ICD-10-CM

## 2022-02-18 ENCOUNTER — Encounter (INDEPENDENT_AMBULATORY_CARE_PROVIDER_SITE_OTHER): Payer: Medicare Other | Admitting: Primary Care

## 2022-02-19 ENCOUNTER — Ambulatory Visit: Payer: Medicare Other

## 2022-03-02 ENCOUNTER — Ambulatory Visit
Admission: RE | Admit: 2022-03-02 | Discharge: 2022-03-02 | Disposition: A | Discharge status: Home / Self Care | Payer: Medicare Other | Source: Ambulatory Visit | Attending: Specialist | Admitting: Specialist

## 2022-03-02 DIAGNOSIS — R29898 Other symptoms and signs involving the musculoskeletal system: Secondary | ICD-10-CM

## 2022-03-02 DIAGNOSIS — M5116 Intervertebral disc disorders with radiculopathy, lumbar region: Secondary | ICD-10-CM | POA: Diagnosis not present

## 2022-03-02 MED ORDER — GADOPICLENOL 0.5 MMOL/ML IV SOLN
9.0000 mL | Freq: Once | INTRAVENOUS | Status: AC | PRN
  Administered 2022-03-02: 9 mL via INTRAVENOUS

## 2022-03-04 NOTE — Therapy (Signed)
OUTPATIENT PHYSICAL THERAPY THORACOLUMBAR EVALUATION   Patient Name: Scott Tucker MRN: [id number] DOB:1966/04/27, 56 y.o., male Today's Date: 03/05/2022   PT End of Session - 03/05/22 1256     Visit Number 1    Number of Visits 9    Date for PT Re-Evaluation 05/05/22    Authorization Type [employer]    PT Start Time 1215    PT Stop Time 1300    PT Time Calculation (min) 45 min    Activity Tolerance Patient limited by pain    Behavior During Therapy [facility] for tasks assessed/performed             Past Medical History:  Diagnosis Date   Anginal pain (HCC)    Anxiety    Arthritis    Chronic back pain    Coronary artery disease    Diabetes mellitus without complication (HCC)    type 2   Dyspnea    Headache    HTN (hypertension)    Neuromuscular disorder (HCC)    Past Surgical History:  Procedure Laterality Date   ANTERIOR CERVICAL DECOMP/DISCECTOMY FUSION N/A 12/04/2020   Procedure: ANTERIOR CERVICAL DISCECTOMY FUSION C3-4, C4-5, ALLOGRAFT, PLATE;  Surgeon: Eldred Manges, MD;  Location: [facility] OR;  Service: Orthopedics;  Laterality: N/A;  needs RNFA   BACK SURGERY     1992 and 2003(dr kritzer)   CARDIAC CATHETERIZATION     SHOULDER SURGERY Right    2017   TONSILLECTOMY     removed as a child   Patient Active Problem List   Diagnosis Date Noted   Depressive reaction    Leukocytosis    Neuropathic pain    Spondylolisthesis, lumbar region 12/04/2021    Class: Chronic   Degenerative disc disease, lumbar 12/04/2021   Lumbar stenosis 12/04/2021   Status post lumbar spinal fusion 12/04/2021   Spinal stenosis of lumbar region 05/30/2021   S/P cervical spinal fusion 12/18/2020   Chronic radicular cervical pain 05/01/2020   Foraminal stenosis of cervical region (right) 05/01/2020   Chronic pain syndrome 05/01/2020   History of lumbar fusion 05/01/2020   HTN (hypertension)    Diabetes mellitus without complication (HCC)    Chronic back pain    Coronary artery disease     CAD (coronary artery disease) 03/22/2019   Hypertension 02/23/2019   Hyperlipidemia 02/23/2019   Chest pain 02/22/2019    PCP: Grayce Sessions, NP  REFERRING PROVIDER: Kerrin Champagne, MD  REFERRING DIAG: M51.36 (ICD-10-CM) - Degenerative disc disease, lumbar M43.16 (ICD-10-CM) - Spondylolisthesis, lumbar region   Rationale for Evaluation and Treatment Rehabilitation  THERAPY DIAG: S/{P left TLIFs L3-4 and L4-5 above previous L5-S1 fusion with right quadriceps weakness.    ONSET DATE: 12/04/21 surgery  SUBJECTIVE:  SUBJECTIVE STATEMENT: Reports BLE pain and paresthesias.  Has had lumbar fusion and 2 previous lumbar surgeries  PERTINENT HISTORY:  Assessment & Plan:2.5 months post op L3-4 and L4-5 left TLIF for spinal stenosis with congenital foramenal narrowing. Has bilateral hypesthesias medial thighs and calfs.    Chief Complaint:     Chief Complaint  Patient presents with   Lower Back - Follow-up  Bilateral leg numbness and tingling.  SLR is negative  Weak right more than left Quadriceps. Remainder intact. CT with right more than left foramenal narrowing L3 and L4  PAIN:  Are you having pain? Yes: NPRS scale: 8/10 Pain location: low back and BLEs Pain description: ache and paresthesias Aggravating factors: activity Relieving factors: marijuana  PRECAUTIONS: Avoid frequent bending and stooping  No lifting greater than 10 lbs. May use ice or moist heat for pain. Weight loss is of benefit. Will start a pool exercise rehabilitation program. Presently with right quadriceps weakness. Obtain MRI to assess for residual foramenal narrowing L3 and L4.  Exercise is important to improve your indurance and does allow people to function better inspite of back pain  WEIGHT BEARING  RESTRICTIONS: No  FALLS:  Has patient fallen in last 6 months? No  LIVING ENVIRONMENT: Lives with: lives with their family  OCCUPATION: disability  PLOF: Independent  PATIENT GOALS: To reduce and manage my low back pain   OBJECTIVE:   DIAGNOSTIC FINDINGS:  MRI 03/02/22 results pending  PATIENT SURVEYS:  FOTO TBD  SCREENING FOR RED FLAGS: negative    SENSATION: Not tested  MUSCLE LENGTH: Hamstrings: Right 70 deg; Left 50 deg T  POSTURE:  not tested   PALPATION: Not tested  LUMBAR ROM: deferred per post op restrictions  AROM eval  Flexion   Extension   Right lateral flexion   Left lateral flexion   Right rotation   Left rotation    (Blank rows = not tested)  LOWER EXTREMITY ROM:     Active  Right eval Left eval  Hip flexion    Hip extension    Hip abduction    Hip adduction    Hip internal rotation    Hip external rotation    Knee flexion [facility] WFL  Knee extension [facility]  Ankle dorsiflexion [facility] WFL  Ankle plantarflexion [facility] WFL  Ankle inversion    Ankle eversion     (Blank rows = not tested)  LOWER EXTREMITY MMT:    MMT Right eval Left eval  Hip flexion 3+ 3+  Hip extension 3+ 3+  Hip abduction 3+ 3+  Hip adduction    Hip internal rotation    Hip external rotation    Knee flexion 3+ 3+  Knee extension 3+ 3+  Ankle dorsiflexion    Ankle plantarflexion 3+ 3+  Ankle inversion    Ankle eversion    Core/trunk  3 3   (Blank rows = not tested)  LUMBAR SPECIAL TESTS:  Straight leg raise test: Negative and Slump test: Negative  FUNCTIONAL TESTS:  30 seconds chair stand test 1 stand arms crossed   GAIT: Distance walked: 22ftx2 Assistive device utilized: None Level of assistance: Complete Independence Comments: antalgic gait with lumbar support  TODAY'S TREATMENT:  DATE: 03/05/22    PATIENT EDUCATION:   Education details: Discussed eval findings, rehab rationale and POC and patient is in agreement  Person educated: Patient Education method: Explanation Education comprehension: verbalized understanding and needs further education  HOME EXERCISE PROGRAM: Access Code: [email or web address] URL: [email or web address] Date: 03/05/2022 Prepared by: Sharlynn Oliphant  Exercises - Supine Posterior Pelvic Tilt  - 5 x daily - 5 x weekly - 1 sets - 5 reps - 3s hold - Supine March  - 5 x daily - 5 x weekly - 1 sets - 5 reps - Standing Heel Raise with Support  - 5 x daily - 5 x weekly - 1 sets - 5 reps - Seated Abdominal Press into Swiss Ball  - 5 x daily - 5 x weekly - 1 sets - 5 reps - 3s hold  ASSESSMENT:  CLINICAL IMPRESSION: Patient is a 56 y.o. male  who was seen today for physical therapy evaluation and treatment for low back pain and paresthesias as well as BLE weakness s/p lumbar fusion.  Patient presents with decreased mobility, limited transfer ability, decreased LE strength and poor trunk strength and control.  No neuro signs elicited but guarding noted throughout trunk and hips.   OBJECTIVE IMPAIRMENTS: Abnormal gait, decreased activity tolerance, decreased balance, decreased endurance, decreased knowledge of condition, decreased mobility, difficulty walking, decreased ROM, decreased strength, increased muscle spasms, impaired flexibility, improper body mechanics, postural dysfunction, and pain.   ACTIVITY LIMITATIONS: carrying, lifting, bending, sitting, standing, squatting, stairs, and bed mobility  PERSONAL FACTORS: Fitness, Past/current experiences, Time since onset of injury/illness/exacerbation, and 1 comorbidity: previous lumbar surgeries   are also affecting patient's functional outcome.   REHAB POTENTIAL: Fair based on multiple surgeries  and chronicity of symptoms  CLINICAL DECISION MAKING: Evolving/moderate complexity  EVALUATION COMPLEXITY: Low   GOALS: Goals reviewed  with patient? Yes  SHORT TERM GOALS: Target date: 03/19/2022  Patient to demonstrate independence in HEP  Baseline:[id number] Goal status: INITIAL  2.  Initiate aquatics program Baseline: TBD Goal status: INITIAL   LONG TERM GOALS: Target date: 04/02/2022  70d L SLR Baseline: 50d SLR Goal status: INITIAL  2.  Increase 30s stand test to 5 reps arms crossed Baseline: 1 stand arms crossed Goal status: INITIAL  3.  Increase BLE strength to 4-/5 Baseline:  MMT Right eval Left eval  Hip flexion 3+ 3+  Hip extension 3+ 3+  Hip abduction 3+ 3+  Hip adduction    Hip internal rotation    Hip external rotation    Knee flexion 3+ 3+  Knee extension 3+ 3+  Ankle dorsiflexion    Ankle plantarflexion 3+ 3+   Goal status: INITIAL   PLAN:  PT FREQUENCY: 2x/week  PT DURATION: 4 weeks  PLANNED INTERVENTIONS: Therapeutic exercises, Therapeutic activity, Neuromuscular re-education, Balance training, Gait training, Patient/Family education, Self Care, Joint mobilization, Stair training, DME instructions, Aquatic Therapy, Dry Needling, and Re-evaluation.  PLAN FOR NEXT SESSION: review and update HEP, initiate aquatic PT, LE strengthening    Lanice Shirts, PT 03/05/2022, 12:57 PM

## 2022-03-05 ENCOUNTER — Ambulatory Visit: Payer: Medicare Other | Attending: Specialist

## 2022-03-05 ENCOUNTER — Other Ambulatory Visit: Payer: Self-pay

## 2022-03-05 DIAGNOSIS — M5459 Other low back pain: Secondary | ICD-10-CM | POA: Diagnosis not present

## 2022-03-05 DIAGNOSIS — R531 Weakness: Secondary | ICD-10-CM | POA: Diagnosis not present

## 2022-03-05 DIAGNOSIS — Z981 Arthrodesis status: Secondary | ICD-10-CM | POA: Diagnosis not present

## 2022-03-05 DIAGNOSIS — R29898 Other symptoms and signs involving the musculoskeletal system: Secondary | ICD-10-CM | POA: Insufficient documentation

## 2022-03-05 NOTE — Patient Instructions (Signed)
Aquatic Therapy at [facility]-  What to Expect!  Where:   [facility] [address] [facility] [place], [place] [address] Rehab phone [phone]  NOTE:  You will receive an automated phone message reminding you of your appt and it will say the appointment is at the [address] [facility] [address] [facility] clinic.          How to Prepare: Please make sure you drink 8 ounces of water about one hour prior to your pool session A caregiver may attend if needed with the patient to help assist as needed. A caregiver can sit in the pool room on chair. Please arrive IN YOUR SUIT and 15 minutes prior to your appointment - this helps to avoid delays in starting your session. Please make sure to attend to any toileting needs prior to entering the pool Locker rooms for changing are provided.   There is direct access to the pool deck form the locker room.  You can lock your belongings in a locker with lock provided. Once on the pool deck your therapist will ask if you have signed the Patient  Consent and Assignment of Benefits form before beginning treatment Your therapist may take your blood pressure prior to, during and after your session if indicated We usually try and create a home exercise program based on activities we do in the pool.  Please be thinking about who might be able to assist you in the pool should you need to participate in an aquatic home exercise program at the time of discharge if you need assistance.  Some patients do not want to or do not have the ability to participate in an aquatic home program - this is not a barrier in any way to you participating in aquatic therapy as part of your current therapy plan! After Discharge from PT, you can continue using home program at  the [facility]/, there is a drop-in fee for $5 ($45 a month)or for 60 years  or older $4.00 ($40 a month for seniors ) or any local [employer] pool.  Memberships for purchase are  available for gym/pool at [facility]  IT IS VERY IMPORTANT THAT YOUR LAST VISIT BE IN THE CLINIC AT [address] AFTER YOUR LAST AQUATIC VISIT.  PLEASE MAKE SURE THAT YOU HAVE A [address]  APPOINTMENT SCHEDULED.   About the pool: Pool is located approximately 500 FT from the entrance of the building.  Please bring a support person if you need assistance traveling this      distance.   Your therapist will assist you in entering the water; there are two ways to           enter: stairs with railings, and a mechanical lift. Your therapist will determine the most appropriate way for you.  Water temperature is usually between 88-90 degrees  There may be up to 2 other swimmers in the pool at the same time  The pool deck is tile, please wear shoes with good traction if you prefer not to be barefoot.    Contact Info:  For appointment scheduling and cancellations:         Please call the [facility]  PH:[phone]              Aquatic Therapy  Outpatient [facility] @ [facility]       All sessions are 45 minutes                                                    

## 2022-03-11 ENCOUNTER — Other Ambulatory Visit: Payer: Self-pay

## 2022-03-11 ENCOUNTER — Encounter (INDEPENDENT_AMBULATORY_CARE_PROVIDER_SITE_OTHER): Payer: Medicare Other | Admitting: Primary Care

## 2022-03-11 ENCOUNTER — Encounter: Payer: Self-pay | Admitting: Specialist

## 2022-03-11 ENCOUNTER — Ambulatory Visit (INDEPENDENT_AMBULATORY_CARE_PROVIDER_SITE_OTHER): Payer: Medicare Other | Admitting: Specialist

## 2022-03-11 VITALS — BP 120/82 | HR 91 | Ht 66.0 in | Wt 196.0 lb

## 2022-03-11 DIAGNOSIS — M5417 Radiculopathy, lumbosacral region: Secondary | ICD-10-CM

## 2022-03-11 DIAGNOSIS — M4316 Spondylolisthesis, lumbar region: Secondary | ICD-10-CM

## 2022-03-11 DIAGNOSIS — Z981 Arthrodesis status: Secondary | ICD-10-CM

## 2022-03-11 DIAGNOSIS — R29898 Other symptoms and signs involving the musculoskeletal system: Secondary | ICD-10-CM

## 2022-03-11 MED ORDER — ZOLPIDEM TARTRATE ER 12.5 MG PO TBCR
12.5000 mg | EXTENDED_RELEASE_TABLET | Freq: Every evening | ORAL | 0 refills | Status: DC | PRN
  Filled 2022-03-11 – 2022-06-12 (×4): qty 21, 21d supply, fill #0

## 2022-03-11 MED ORDER — B COMPLEX VITAMINS PO CAPS
1.0000 | ORAL_CAPSULE | Freq: Every day | ORAL | 3 refills | Status: DC
  Filled 2022-03-11: qty 30, 30d supply, fill #0
  Filled 2022-03-20 – 2022-06-12 (×2): qty 100, 100d supply, fill #0

## 2022-03-11 NOTE — Patient Instructions (Signed)
Avoid frequent bending and stooping  No lifting greater than 10 lbs. May use ice or moist heat for pain. Weight loss is of benefit. Best medication for lumbar disc disease is arthritis medications like motrin, celebrex and naprosyn. Exercise is important to improve your indurance and does allow people to function better inspite of back pain. Pool exercises Then after 1-2 weeks start to wean from brace adding 1-2 hours to being out of the brace each week till at 8 hours a day out of  Brace then may discontinuel. Ambien CR 12.5 mg prescribed and advised to take within 1-2 hours post taking methocarbamol or lyrica.

## 2022-03-11 NOTE — Progress Notes (Signed)
Post-Op Visit Note   Patient: Scott Tucker           Date of Birth: 04-30-1966           MRN: [id number] Visit Date: 03/11/2022 PCP: Kerin Perna, NP   Assessment & Plan:  Chief Complaint:  Chief Complaint  Patient presents with   Lower Back - Pain, Follow-up    MRI review   Visit Diagnoses:  1. Spondylolisthesis, lumbar region   2. Bilateral leg weakness   3. S/P lumbar fusion   4. 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 Post op with bilateral leg  Weakness. Underwent recent MRI and he is scheduled to start pool exercises.  Exam with bilateral LE motor intact right L3 hypesthesia. TLIFs were done on the left side. MRI without definite ongoing nerve compression.  Narrative & Impression  CLINICAL DATA:  Lumbar radiculopathy, symptoms for cyst with greater than 6 weeks treatment. Postop extension of fusion L3-4 and L4-5. Bilateral L3 radicular pain and quadriceps weakness.   EXAM: MRI LUMBAR SPINE WITHOUT AND WITH CONTRAST   TECHNIQUE: Multiplanar and multiecho pulse sequences of the lumbar spine were obtained without and with intravenous contrast.   CONTRAST:  9 ccVueway   COMPARISON:  CT 12/10/2021. MRI 06/28/2020.   FINDINGS: Segmentation: 5 lumbar type vertebral bodies as numbered previously.   Alignment:  No malalignment.   Vertebrae: Previous fusion from L3 to the sacrum as described below.   Conus medullaris and cauda equina: Conus extends to the L1 level. Conus and cauda equina appear normal.   Paraspinal and other soft tissues: Negative   Disc levels:   T11-12: Bulging of the disc more prominent towards the left. Facet and ligamentous hypertrophy. No canal stenosis. Bilateral foraminal narrowing as seen previously.   T12-L1, and L1-2: Normal   L2-3: Mild bulging of the disc in both posterolateral to foraminal regions. Mild facet and  ligamentous hypertrophy. No central canal stenosis. Mild bilateral foraminal narrowing without visible neural compression. Minimal worsening of these findings since February of 2022.   L3-4 and L4-5: Previous discectomy and fusion. Apparent sufficient patency of the canal and foramina, allowing for artifact from fusion hardware. No complicating feature discernible by MRI.   L5-S1: Distant posterior decompression, diskectomy and fusion. Previous hardware removal. Sufficient patency of the canal and foramina. Some regional epidural scarring as would be expected.   IMPRESSION: 1. Previous discectomy and fusion from L3 through S1. Previous hardware removal at S1. Sufficient patency of the canal and foramina at those levels, allowing for artifact from fusion hardware. 2. L2-3: Mild bulging of the disc in both posterolateral to foraminal regions. Mild facet and ligamentous hypertrophy. Mild bilateral foraminal narrowing without visible neural compression. Minimal worsening of these findings since February of 2022. 3. T11-12: Disc bulge more prominent towards the left. Facet and ligamentous hypertrophy. Bilateral foraminal narrowing as seen previously.     Electronically Signed   By: Nelson Chimes M.D.   On: 03/05/2022 09:10     Plan: Avoid frequent bending and stooping  No lifting greater than 10 lbs. May use ice or moist heat for pain. Weight loss is of benefit. Best medication for lumbar disc disease is arthritis medications like motrin, celebrex and naprosyn. Exercise is important to improve your indurance and does allow people to function better inspite of back pain. Pool exercises Then after 1-2 weeks  start to wean from brace adding 1-2 hours to being out of the brace each week till at 8 hours a day out of  Brace then may discontinuel. Ambien CR 12.5 mg prescribed and advised to take within 1-2 hours post taking methocarbamol or lyrica.    Follow-Up Instructions: No follow-ups  on file.   Orders:  No orders of the defined types were placed in this encounter.  No orders of the defined types were placed in this encounter.   Imaging: No results found.  PMFS History: Patient Active Problem List   Diagnosis Date Noted   Spondylolisthesis, lumbar region 12/04/2021    Priority: High    Class: Chronic   Degenerative disc disease, lumbar 12/04/2021    Priority: High   Depressive reaction    Leukocytosis    Neuropathic pain    Lumbar stenosis 12/04/2021   Status post lumbar spinal fusion 12/04/2021   Spinal stenosis of lumbar region 05/30/2021   S/P cervical spinal fusion 12/18/2020   Chronic radicular cervical pain 05/01/2020   Foraminal stenosis of cervical region (right) 05/01/2020   Chronic pain syndrome 05/01/2020   History of lumbar fusion 05/01/2020   HTN (hypertension)    Diabetes mellitus without complication (HCC)    Chronic back pain    Coronary artery disease    CAD (coronary artery disease) 03/22/2019   Hypertension 02/23/2019   Hyperlipidemia 02/23/2019   Chest pain 02/22/2019   Past Medical History:  Diagnosis Date   Anginal pain (HCC)    Anxiety    Arthritis    Chronic back pain    Coronary artery disease    Diabetes mellitus without complication (HCC)    type 2   Dyspnea    Headache    HTN (hypertension)    Neuromuscular disorder (HCC)     Family History  Problem Relation Age of Onset   Heart disease Maternal Uncle     Past Surgical History:  Procedure Laterality Date   ANTERIOR CERVICAL DECOMP/DISCECTOMY FUSION N/A 12/04/2020   Procedure: ANTERIOR CERVICAL DISCECTOMY FUSION C3-4, C4-5, ALLOGRAFT, PLATE;  Surgeon: Marybelle Killings, MD;  Location: [facility];  Service: Orthopedics;  Laterality: N/A;  needs [place] and 2003(dr kritzer)   CARDIAC CATHETERIZATION     SHOULDER SURGERY Right    2017   TONSILLECTOMY     removed as a child   Social History   Occupational History   Not on file  Tobacco Use    Smoking status: Never   Smokeless tobacco: Never  Vaping Use   Vaping Use: Never used  Substance and Sexual Activity   Alcohol use: Yes    Alcohol/week: 3.0 standard drinks of alcohol    Types: 3 Glasses of wine per week   Drug use: Not Currently    Types: Marijuana    Comment: smokes mostly every day   Sexual activity: Not Currently

## 2022-03-12 ENCOUNTER — Other Ambulatory Visit: Payer: Self-pay

## 2022-03-12 DIAGNOSIS — Z008 Encounter for other general examination: Secondary | ICD-10-CM | POA: Diagnosis not present

## 2022-03-14 ENCOUNTER — Encounter: Payer: Self-pay | Admitting: Physical Therapy

## 2022-03-14 ENCOUNTER — Other Ambulatory Visit: Payer: Self-pay

## 2022-03-14 ENCOUNTER — Ambulatory Visit: Payer: Medicare Other | Attending: Specialist | Admitting: Physical Therapy

## 2022-03-14 DIAGNOSIS — R531 Weakness: Secondary | ICD-10-CM | POA: Diagnosis not present

## 2022-03-14 DIAGNOSIS — M4316 Spondylolisthesis, lumbar region: Secondary | ICD-10-CM | POA: Insufficient documentation

## 2022-03-14 DIAGNOSIS — M792 Neuralgia and neuritis, unspecified: Secondary | ICD-10-CM | POA: Insufficient documentation

## 2022-03-14 DIAGNOSIS — M5136 Other intervertebral disc degeneration, lumbar region: Secondary | ICD-10-CM | POA: Insufficient documentation

## 2022-03-14 DIAGNOSIS — M5459 Other low back pain: Secondary | ICD-10-CM | POA: Insufficient documentation

## 2022-03-14 DIAGNOSIS — Z981 Arthrodesis status: Secondary | ICD-10-CM | POA: Insufficient documentation

## 2022-03-14 DIAGNOSIS — R6889 Other general symptoms and signs: Secondary | ICD-10-CM | POA: Diagnosis not present

## 2022-03-14 NOTE — Therapy (Signed)
OUTPATIENT PHYSICAL THERAPY TREATMENT NOTE   Patient Name: Scott Tucker MRN: [id number] DOB:05-Jan-1966, 56 y.o., male Today's Date: 03/15/2022  PCP: Kerin Perna, NP   REFERRING PROVIDER: Jessy Oto, MD   PT End of Session - 03/14/22 1613     Visit Number 2    Number of Visits 9    Date for PT Re-Evaluation 05/05/22    Authorization Type BCBS    PT Start Time 0415    PT Stop Time 0456    PT Time Calculation (min) 41 min    Activity Tolerance Patient limited by pain    Behavior During Therapy [facility] for tasks assessed/performed             Past Medical History:  Diagnosis Date   Anginal pain ([place])    Anxiety    Arthritis    Chronic back pain    Coronary artery disease    Diabetes mellitus without complication ([place])    type 2   Dyspnea    Headache    HTN (hypertension)    Neuromuscular disorder ([place])    Past Surgical History:  Procedure Laterality Date   ANTERIOR CERVICAL DECOMP/DISCECTOMY FUSION N/A 12/04/2020   Procedure: ANTERIOR CERVICAL DISCECTOMY FUSION C3-4, C4-5, ALLOGRAFT, PLATE;  Surgeon: Marybelle Killings, MD;  Location: [place];  Service: Orthopedics;  Laterality: N/A;  needs [place] and 2003(dr kritzer)   CARDIAC CATHETERIZATION     SHOULDER SURGERY Right    2017   TONSILLECTOMY     removed as a child   Patient Active Problem List   Diagnosis Date Noted   Depressive reaction    Leukocytosis    Neuropathic pain    Spondylolisthesis, lumbar region 12/04/2021    Class: Chronic   Degenerative disc disease, lumbar 12/04/2021   Lumbar stenosis 12/04/2021   Status post lumbar spinal fusion 12/04/2021   Spinal stenosis of lumbar region 05/30/2021   S/P cervical spinal fusion 12/18/2020   Chronic radicular cervical pain 05/01/2020   Foraminal stenosis of cervical region (right) 05/01/2020   Chronic pain syndrome 05/01/2020   History of lumbar fusion 05/01/2020   HTN (hypertension)    Diabetes mellitus without  complication ([facility])    Chronic back pain    Coronary artery disease    CAD (coronary artery disease) 03/22/2019   Hypertension 02/23/2019   Hyperlipidemia 02/23/2019   Chest pain 02/22/2019    THERAPY DIAG:  Decreased strength  Other low back pain   Rationale for Evaluation and Treatment Rehabilitation  REFERRING DIAG: M51.36 (ICD-10-CM) - Degenerative disc disease, lumbar M43.16 (ICD-10-CM) - Spondylolisthesis, lumbar region    PERTINENT HISTORY: Assessment & Plan:2.5 months post op L3-4 and L4-5 left TLIF for spinal stenosis with congenital foramenal narrowing. Has bilateral hypesthesias medial thighs and calfs.    Chief Complaint:       Chief Complaint  Patient presents with   Lower Back - Follow-up  Bilateral leg numbness and tingling.  SLR is negative  Weak right more than left Quadriceps. Remainder intact. CT with right more than left foramenal narrowing L3 and L4  PRECAUTIONS/RESTRICTIONS:    Avoid frequent bending and stooping  No lifting greater than 10 lbs. May use ice or moist heat for pain. Weight loss is of benefit. Will start a pool exercise rehabilitation program. Presently with right quadriceps weakness. Obtain MRI to assess for residual foramenal narrowing L3 and L4.  Exercise is important to improve your  indurance and does allow people to function better inspite of back pain  SUBJECTIVE:  Scott Tucker reports that his back is very sore today.  He is in the process of moving.  PAIN:  Are you having pain? Yes: NPRS scale: 8/10 Pain location: low back and BLEs Pain description: ache and paresthesias Aggravating factors: activity Relieving factors: marijuana  OBJECTIVE: (objective measures completed at initial evaluation unless otherwise dated)  DIAGNOSTIC FINDINGS:  MRI 03/02/22 results pending   PATIENT SURVEYS:  FOTO TBD   SCREENING FOR RED FLAGS: negative                    SENSATION: Not tested   MUSCLE LENGTH: Hamstrings: Right 70 deg;  Left 50 deg T   POSTURE:  not tested    PALPATION: Not tested   LUMBAR ROM: deferred per post op restrictions   AROM eval  Flexion    Extension    Right lateral flexion    Left lateral flexion    Right rotation    Left rotation     (Blank rows = not tested)   LOWER EXTREMITY ROM:      Active  Right eval Left eval  Hip flexion      Hip extension      Hip abduction      Hip adduction      Hip internal rotation      Hip external rotation      Knee flexion [facility] WFL  Knee extension [facility]  Ankle dorsiflexion [facility] WFL  Ankle plantarflexion [facility] WFL  Ankle inversion      Ankle eversion       (Blank rows = not tested)   LOWER EXTREMITY MMT:     MMT Right eval Left eval  Hip flexion 3+ 3+  Hip extension 3+ 3+  Hip abduction 3+ 3+  Hip adduction      Hip internal rotation      Hip external rotation      Knee flexion 3+ 3+  Knee extension 3+ 3+  Ankle dorsiflexion      Ankle plantarflexion 3+ 3+  Ankle inversion      Ankle eversion      Core/trunk  3 3   (Blank rows = not tested)   LUMBAR SPECIAL TESTS:  Straight leg raise test: Negative and Slump test: Negative   FUNCTIONAL TESTS:  30 seconds chair stand test 1 stand arms crossed    GAIT: Distance walked: 33ftx2 Assistive device utilized: None Level of assistance: Complete Independence Comments: antalgic gait with lumbar support     TREATMENT 03/14/22:  Aquatic therapy at [facility]- [address] - therapeutic pool temp 92 degrees Pt enters building independently.  Treatment took place in water 3.8 to  4 ft 8 in.feet deep depending upon activity.  Pt entered and exited the pool via stair and handrails    Aquatic Therapy:  Water walking for warm up  Stretching: Hip flexor on bottom step x30" BIL Hamstring stretch on bottom step x30" BIL Figure 4 squat stretch, BIL UE support 2x30" BIL (NT)  At edge of pool, pt performed LE exercise: Standing march x20 Heel raises 2x20 Hamstring curl x20  BIL Hip flexion -> extension 2x10 BIL Hip abd/add x20 BIL Squats x20 Mini lunge lateral x20 Rainbow DB push down - abd, flexion, alt abd, alt flexion - 20x ea  Pt requires the buoyancy of water for active assisted exercises with buoyancy supported for strengthening and AROM exercises.  Hydrostatic pressure also supports joints by unweighting joint load by at least 50 % in 3-4 feet depth water. 80% in chest to neck deep water. Water will provide assistance with movement using the current and laminar flow while the buoyancy reduces weight bearing. Pt requires the viscosity of the water for resistance with strengthening exercises.   ASSESSMENT:   CLINICAL IMPRESSION: Session today focused on relatively gentle hip and core strengthening in the aquatic environment for use of buoyancy to offload joints and the viscosity of water as resistance during therapeutic exercise.  Patient was able to tolerate all prescribed exercises in the aquatic environment with no adverse effects and reports 7/10 pain at the end of the session. Patient continues to benefit from skilled PT services on land and aquatic based and should be progressed as able to improve functional independence.     OBJECTIVE IMPAIRMENTS: Abnormal gait, decreased activity tolerance, decreased balance, decreased endurance, decreased knowledge of condition, decreased mobility, difficulty walking, decreased ROM, decreased strength, increased muscle spasms, impaired flexibility, improper body mechanics, postural dysfunction, and pain.    ACTIVITY LIMITATIONS: carrying, lifting, bending, sitting, standing, squatting, stairs, and bed mobility   PERSONAL FACTORS: Fitness, Past/current experiences, Time since onset of injury/illness/exacerbation, and 1 comorbidity: previous lumbar surgeries   are also affecting patient's functional outcome.    REHAB POTENTIAL: Fair based on multiple surgeries  and chronicity of symptoms   CLINICAL DECISION MAKING:  Evolving/moderate complexity   EVALUATION COMPLEXITY: Low     GOALS: Goals reviewed with patient? Yes   SHORT TERM GOALS: Target date: 03/19/2022   Patient to demonstrate independence in HEP  Baseline:[id number] Goal status: INITIAL   2.  Initiate aquatics program Baseline: TBD Goal status: INITIAL     LONG TERM GOALS: Target date: 04/02/2022   70d L SLR Baseline: 50d SLR Goal status: INITIAL   2.  Increase 30s stand test to 5 reps arms crossed Baseline: 1 stand arms crossed Goal status: INITIAL   3.  Increase BLE strength to 4-/5 Baseline:  MMT Right eval Left eval  Hip flexion 3+ 3+  Hip extension 3+ 3+  Hip abduction 3+ 3+  Hip adduction      Hip internal rotation      Hip external rotation      Knee flexion 3+ 3+  Knee extension 3+ 3+  Ankle dorsiflexion      Ankle plantarflexion 3+ 3+    Goal status: INITIAL     PLAN:   PT FREQUENCY: 2x/week   PT DURATION: 4 weeks   PLANNED INTERVENTIONS: Therapeutic exercises, Therapeutic activity, Neuromuscular re-education, Balance training, Gait training, Patient/Family education, Self Care, Joint mobilization, Stair training, DME instructions, Aquatic Therapy, Dry Needling, and Re-evaluation.   PLAN FOR NEXT SESSION: review and update HEP, initiate aquatic PT, LE strengthening    Kimberlee Nearing Jerline Linzy PT 03/15/2022, 7:31 AM

## 2022-03-15 ENCOUNTER — Ambulatory Visit: Payer: Medicare Other

## 2022-03-15 DIAGNOSIS — M5459 Other low back pain: Secondary | ICD-10-CM

## 2022-03-15 DIAGNOSIS — R531 Weakness: Secondary | ICD-10-CM | POA: Diagnosis not present

## 2022-03-15 DIAGNOSIS — R6889 Other general symptoms and signs: Secondary | ICD-10-CM | POA: Diagnosis not present

## 2022-03-15 NOTE — Therapy (Signed)
OUTPATIENT PHYSICAL THERAPY TREATMENT NOTE   Patient Name: Scott Tucker MRN: [id number] DOB:[birth year], 56 y.o., male Today's Date: 03/15/2022  PCP: Kerin Perna, NP   REFERRING PROVIDER: Jessy Oto, MD   PT End of Session - 03/15/22 1512     Visit Number 3    Number of Visits 9    Date for PT Re-Evaluation 05/05/22    Authorization Type BCBS    PT Start Time 1515    PT Stop Time 1600    PT Time Calculation (min) 45 min    Activity Tolerance Patient limited by pain    Behavior During Therapy WFL for tasks assessed/performed              Past Medical History:  Diagnosis Date   Anginal pain ([place])    Anxiety    Arthritis    Chronic back pain    Coronary artery disease    Diabetes mellitus without complication ([place])    type 2   Dyspnea    Headache    HTN (hypertension)    Neuromuscular disorder ([place])    Past Surgical History:  Procedure Laterality Date   ANTERIOR CERVICAL DECOMP/DISCECTOMY FUSION N/A 12/04/2020   Procedure: ANTERIOR CERVICAL DISCECTOMY FUSION C3-4, C4-5, ALLOGRAFT, PLATE;  Surgeon: Marybelle Killings, MD;  Location: [place];  Service: Orthopedics;  Laterality: N/A;  needs [place] and 2003(dr kritzer)   CARDIAC CATHETERIZATION     SHOULDER SURGERY Right    2017   TONSILLECTOMY     removed as a child   Patient Active Problem List   Diagnosis Date Noted   Depressive reaction    Leukocytosis    Neuropathic pain    Spondylolisthesis, lumbar region 12/04/2021    Class: Chronic   Degenerative disc disease, lumbar 12/04/2021   Lumbar stenosis 12/04/2021   Status post lumbar spinal fusion 12/04/2021   Spinal stenosis of lumbar region 05/30/2021   S/P cervical spinal fusion 12/18/2020   Chronic radicular cervical pain 05/01/2020   Foraminal stenosis of cervical region (right) 05/01/2020   Chronic pain syndrome 05/01/2020   History of lumbar fusion 05/01/2020   HTN (hypertension)    Diabetes mellitus without  complication (H[facility])    Chronic back pain    Coronary artery disease    CAD (coronary artery disease) 03/22/2019   Hypertension 02/23/2019   Hyperlipidemia 02/23/2019   Chest pain 02/22/2019    THERAPY DIAG:  Decreased strength  Other low back pain   Rationale for Evaluation and Treatment Rehabilitation  REFERRING DIAG: M51.36 (ICD-10-CM) - Degenerative disc disease, lumbar M43.16 (ICD-10-CM) - Spondylolisthesis, lumbar region    PERTINENT HISTORY: Assessment & Plan:2.5 months post op L3-4 and L4-5 left TLIF for spinal stenosis with congenital foramenal narrowing. Has bilateral hypesthesias medial thighs and calfs.    Chief Complaint:       Chief Complaint  Patient presents with   Lower Back - Follow-up  Bilateral leg numbness and tingling.  SLR is negative  Weak right more than left Quadriceps. Remainder intact. CT with right more than left foramenal narrowing L3 and L4  PRECAUTIONS/RESTRICTIONS:    Avoid frequent bending and stooping  No lifting greater than 10 lbs. May use ice or moist heat for pain. Weight loss is of benefit. Will start a pool exercise rehabilitation program. Presently with right quadriceps weakness. Obtain MRI to assess for residual foramenal narrowing L3 and L4.  Exercise is important to improve  your indurance and does allow people to function better inspite of back pain  SUBJECTIVE:  Patient reports that he slept really well after his aquatic session yesterday. He reports continued LBP with radiating symptoms.  PAIN:  Are you having pain? Yes: NPRS scale: 8/10 Pain location: low back and BLEs Pain description: ache and paresthesias Aggravating factors: activity Relieving factors: marijuana  OBJECTIVE: (objective measures completed at initial evaluation unless otherwise dated)  DIAGNOSTIC FINDINGS:  MRI 03/02/22 results pending   PATIENT SURVEYS:  FOTO TBD   SCREENING FOR RED FLAGS: negative                    SENSATION: Not  tested   MUSCLE LENGTH: Hamstrings: Right 70 deg; Left 50 deg T   POSTURE:  not tested    PALPATION: Not tested   LUMBAR ROM: deferred per post op restrictions   AROM eval  Flexion    Extension    Right lateral flexion    Left lateral flexion    Right rotation    Left rotation     (Blank rows = not tested)   LOWER EXTREMITY ROM:      Active  Right eval Left eval  Hip flexion      Hip extension      Hip abduction      Hip adduction      Hip internal rotation      Hip external rotation      Knee flexion [facility] WFL  Knee extension [facility]  Ankle dorsiflexion [facility] WFL  Ankle plantarflexion [facility] WFL  Ankle inversion      Ankle eversion       (Blank rows = not tested)   LOWER EXTREMITY MMT:     MMT Right eval Left eval  Hip flexion 3+ 3+  Hip extension 3+ 3+  Hip abduction 3+ 3+  Hip adduction      Hip internal rotation      Hip external rotation      Knee flexion 3+ 3+  Knee extension 3+ 3+  Ankle dorsiflexion      Ankle plantarflexion 3+ 3+  Ankle inversion      Ankle eversion      Core/trunk  3 3   (Blank rows = not tested)   LUMBAR SPECIAL TESTS:  Straight leg raise test: Negative and Slump test: Negative   FUNCTIONAL TESTS:  30 seconds chair stand test 1 stand arms crossed    GAIT: Distance walked: 26fx2 Assistive device utilized: None Level of assistance: Complete Independence Comments: antalgic gait with lumbar support   [facility] Adult PT Treatment:                                                DATE: 03/15/2022 [address] therapeutic pool temp 92 degrees Pt enters building ambulating with walking stick  Treatment took place in water 3.8 to  4 ft 8 in.feet deep depending upon activity.  Pt entered and exited the pool via stair and handrails independently  Pt pain level 8/10 at initiation of water walking.  Therapeutic Exercise: Walking forward/backwards/sidestepping Sidestepping with rainbow DB shoulder  abd/adduction x2 laps Stretching: Hip flexor stretch on bottom step x30" BIL Hamstring stretch on bottom step x30" BIL At edge of pool, pt performed LE exercise: Standing march x20  BIL Heel raises 2x20 Hamstring curl x20 BIL Hip flexion -> extension 2x10 BIL Hip abd/add x20 BIL Squats x20 Seated on submerged bench: Bicycle kicks x1' Flutter kicks x1' Scissor kicks x1'  Pt requires the buoyancy of water for active assisted exercises with buoyancy supported for strengthening and AROM exercises. Hydrostatic pressure also supports joints by unweighting joint load by at least 50 % in 3-4 feet depth water. 80% in chest to neck deep water. Water will provide assistance with movement using the current and laminar flow while the buoyancy reduces weight bearing. Pt requires the viscosity of the water for resistance with strengthening exercises.  TREATMENT 03/14/22:  Aquatic therapy at [address] - therapeutic pool temp 92 degrees Pt enters building independently.  Treatment took place in water 3.8 to  4 ft 8 in.feet deep depending upon activity.  Pt entered and exited the pool via stair and handrails    Aquatic Therapy:  Water walking for warm up  Stretching: Hip flexor on bottom step x30" BIL Hamstring stretch on bottom step x30" BIL Figure 4 squat stretch, BIL UE support 2x30" BIL (NT)  At edge of pool, pt performed LE exercise: Standing march x20 Heel raises 2x20 Hamstring curl x20 BIL Hip flexion -> extension 2x10 BIL Hip abd/add x20 BIL Squats x20 Mini lunge lateral x20 Rainbow DB push down - abd, flexion, alt abd, alt flexion - 20x ea  Pt requires the buoyancy of water for active assisted exercises with buoyancy supported for strengthening and AROM exercises. Hydrostatic pressure also supports joints by unweighting joint load by at least 50 % in 3-4 feet depth water. 80% in chest to neck deep water. Water will provide assistance with movement using the current  and laminar flow while the buoyancy reduces weight bearing. Pt requires the viscosity of the water for resistance with strengthening exercises.   ASSESSMENT:   CLINICAL IMPRESSION: Patient presents to aquatic PT with reports of lower back pain that radiates down BIL LE and states he was very tired after aquatic session yesterday. Session today focused on proximal hip and core strengthening in the aquatic environment for use of buoyancy to offload joints and the viscosity of water as resistance during therapeutic exercise. Patient was able to tolerate all prescribed exercises in the aquatic environment with no adverse effects and reports 6-7/10 pain at the end of the session. Patient continues to benefit from skilled PT services on land and aquatic based and should be progressed as able to improve functional independence.    OBJECTIVE IMPAIRMENTS: Abnormal gait, decreased activity tolerance, decreased balance, decreased endurance, decreased knowledge of condition, decreased mobility, difficulty walking, decreased ROM, decreased strength, increased muscle spasms, impaired flexibility, improper body mechanics, postural dysfunction, and pain.    ACTIVITY LIMITATIONS: carrying, lifting, bending, sitting, standing, squatting, stairs, and bed mobility   PERSONAL FACTORS: Fitness, Past/current experiences, Time since onset of injury/illness/exacerbation, and 1 comorbidity: previous lumbar surgeries   are also affecting patient's functional outcome.    REHAB POTENTIAL: Fair based on multiple surgeries  and chronicity of symptoms   CLINICAL DECISION MAKING: Evolving/moderate complexity   EVALUATION COMPLEXITY: Low     GOALS: Goals reviewed with patient? Yes   SHORT TERM GOALS: Target date: 03/19/2022   Patient to demonstrate independence in HEP  Baseline:[id number] Goal status: INITIAL   2.  Initiate aquatics program Baseline: TBD Goal status: MET Initiated 03/14/2022     LONG TERM GOALS:  Target date: 04/02/2022   70d L SLR Baseline: 50d  SLR Goal status: INITIAL   2.  Increase 30s stand test to 5 reps arms crossed Baseline: 1 stand arms crossed Goal status: INITIAL   3.  Increase BLE strength to 4-/5 Baseline:  MMT Right eval Left eval  Hip flexion 3+ 3+  Hip extension 3+ 3+  Hip abduction 3+ 3+  Hip adduction      Hip internal rotation      Hip external rotation      Knee flexion 3+ 3+  Knee extension 3+ 3+  Ankle dorsiflexion      Ankle plantarflexion 3+ 3+    Goal status: INITIAL     PLAN:   PT FREQUENCY: 2x/week   PT DURATION: 4 weeks   PLANNED INTERVENTIONS: Therapeutic exercises, Therapeutic activity, Neuromuscular re-education, Balance training, Gait training, Patient/Family education, Self Care, Joint mobilization, Stair training, DME instructions, Aquatic Therapy, Dry Needling, and Re-evaluation.   PLAN FOR NEXT SESSION: review and update HEP, initiate aquatic PT, LE strengthening    Margarette Canada PTA 03/15/2022, 4:01 PM

## 2022-03-18 ENCOUNTER — Other Ambulatory Visit: Payer: Self-pay

## 2022-03-19 NOTE — Therapy (Signed)
OUTPATIENT PHYSICAL THERAPY TREATMENT NOTE   Patient Name: Scott Tucker MRN: [id number] DOB:1965-12-31, 56 y.o., male Today's Date: 03/20/2022  PCP: Kerin Perna, NP   REFERRING PROVIDER: Jessy Oto, MD   PT End of Session - 03/20/22 1412     Visit Number 4    Number of Visits 9    Date for PT Re-Evaluation 05/05/22    Authorization Type BC[employer]    PT Start Time 1415    PT Stop Time 1500    PT Time Calculation (min) 45 min    Activity Tolerance Patient limited by pain    Behavior During Therapy WFL for tasks assessed/performed              Past Medical History:  Diagnosis Date   Anginal pain ([place])    Anxiety    Arthritis    Chronic back pain    Coronary artery disease    Diabetes mellitus without complication ([place])    type 2   Dyspnea    Headache    HTN (hypertension)    Neuromuscular disorder ([place])    Past Surgical History:  Procedure Laterality Date   ANTERIOR CERVICAL DECOMP/DISCECTOMY FUSION N/A 12/04/2020   Procedure: ANTERIOR CERVICAL DISCECTOMY FUSION C3-4, C4-5, ALLOGRAFT, PLATE;  Surgeon: Marybelle Killings, MD;  Location: [facility];  Service: Orthopedics;  Laterality: N/A;  needs [place] and 2003(dr kritzer)   CARDIAC CATHETERIZATION     SHOULDER SURGERY Right    2017   TONSILLECTOMY     removed as a child   Patient Active Problem List   Diagnosis Date Noted   Depressive reaction    Leukocytosis    Neuropathic pain    Spondylolisthesis, lumbar region 12/04/2021    Class: Chronic   Degenerative disc disease, lumbar 12/04/2021   Lumbar stenosis 12/04/2021   Status post lumbar spinal fusion 12/04/2021   Spinal stenosis of lumbar region 05/30/2021   S/P cervical spinal fusion 12/18/2020   Chronic radicular cervical pain 05/01/2020   Foraminal stenosis of cervical region (right) 05/01/2020   Chronic pain syndrome 05/01/2020   History of lumbar fusion 05/01/2020   HTN (hypertension)    Diabetes mellitus without  complication ([facility])    Chronic back pain    Coronary artery disease    CAD (coronary artery disease) 03/22/2019   Hypertension 02/23/2019   Hyperlipidemia 02/23/2019   Chest pain 02/22/2019    THERAPY DIAG:  Decreased strength  Other low back pain   Rationale for Evaluation and Treatment Rehabilitation  REFERRING DIAG: M51.36 (ICD-10-CM) - Degenerative disc disease, lumbar M43.16 (ICD-10-CM) - Spondylolisthesis, lumbar region    PERTINENT HISTORY: Assessment & Plan:2.5 months post op L3-4 and L4-5 left TLIF for spinal stenosis with congenital foramenal narrowing. Has bilateral hypesthesias medial thighs and calfs.    Chief Complaint:       Chief Complaint  Patient presents with   Lower Back - Follow-up  Bilateral leg numbness and tingling.  SLR is negative  Weak right more than left Quadriceps. Remainder intact. CT with right more than left foramenal narrowing L3 and L4  PRECAUTIONS/RESTRICTIONS:    Avoid frequent bending and stooping  No lifting greater than 10 lbs. May use ice or moist heat for pain. Weight loss is of benefit. Will start a pool exercise rehabilitation program. Presently with right quadriceps weakness. Obtain MRI to assess for residual foramenal narrowing L3 and L4.  Exercise is important to improve  your indurance and does allow people to function better inspite of back pain  SUBJECTIVE:  Pt reports he is a 8-9/10 today and difficulty coming to stand to greet PT in aquatic pool area.    PAIN:  Are you having pain? Yes: NPRS scale: 8/10 Pain location: low back and BLEs Pain description: ache and paresthesias Aggravating factors: activity Relieving factors: marijuana  OBJECTIVE: (objective measures completed at initial evaluation unless otherwise dated)  DIAGNOSTIC FINDINGS:  MRI 03/02/22 results pending   PATIENT SURVEYS:  FOTO TBD   SCREENING FOR RED FLAGS: negative                    SENSATION: Not tested   MUSCLE  LENGTH: Hamstrings: Right 70 deg; Left 50 deg T   POSTURE:  not tested    PALPATION: Not tested   LUMBAR ROM: deferred per post op restrictions   AROM eval  Flexion    Extension    Right lateral flexion    Left lateral flexion    Right rotation    Left rotation     (Tucker rows = not tested)   LOWER EXTREMITY ROM:      Active  Right eval Left eval  Hip flexion      Hip extension      Hip abduction      Hip adduction      Hip internal rotation      Hip external rotation      Knee flexion [facility] WFL  Knee extension [facility]  Ankle dorsiflexion [facility] WFL  Ankle plantarflexion [facility] WFL  Ankle inversion      Ankle eversion       (Tucker rows = not tested)   LOWER EXTREMITY MMT:     MMT Right eval Left eval  Hip flexion 3+ 3+  Hip extension 3+ 3+  Hip abduction 3+ 3+  Hip adduction      Hip internal rotation      Hip external rotation      Knee flexion 3+ 3+  Knee extension 3+ 3+  Ankle dorsiflexion      Ankle plantarflexion 3+ 3+  Ankle inversion      Ankle eversion      Core/trunk  3 3   (Tucker rows = not tested)   LUMBAR SPECIAL TESTS:  Straight leg raise test: Negative and Slump test: Negative   FUNCTIONAL TESTS:  30 seconds chair stand test 1 stand arms crossed    GAIT: Distance walked: [id number] Assistive device utilized: None Level of assistance: Complete Independence Comments: antalgic gait with lumbar support [facility] Adult PT Treatment:                                                DATE: 03-20-22 [address]atic therapy at [address] - therapeutic pool temp 92 degrees Pt enters building ambulating with walking stick  Treatment took place in water 3.8 to  4 ft 8 in.feet deep depending upon activity.  Pt entered and exited the pool via stair and handrails independently  Pt pain level 8-9/10 at initiation of water walking. Pt 6/10 at end of session  Mr FRuebwas educated on  beneficial therapeutic effects of water while ambulating to acclimate  to water walking forward, backward and side stepping. Pt educated on neutral posture and hip hinging in seated  position with water at chest level x 10 with stretch to low back and then x 10 with back at pool wall at external cue, VC for neck tucked to prevent hyperextension. Pt needed extra time  and TC to get into proper position  Therapeutic Exercise: Walking forward/backwards/sidestepping Sidestepping with rainbow DB shoulder abd/adduction x2 laps Stretching: Runners Stretch x 30" x 2 BIL Hamstring stretch x 30" x 2 BIL Gentle trunk rotation to pt tolerance using pool noodle At edge of pool, pt performed LE exercise: Standing march x20 BIL Heel raises 2x20 Hip flexion -> extension 2x10 BIL Hip abd/add x20 BIL Squats x20 Seated on submerged bench: Bicycle kicks x1' Reverse bicycle x 1' Flutter kicks x1' Scissor kicks x1'  Ai Chi  introduction for pt with demo and encouraged  deep breathing and pain control using slow controlled Tai chi like movements with shoulders submerged. Emphasis on shoulder movement / gentle trunk motion and abdominal engagement Ai Chi posture of contemplating to floating then uplifting to enclosing and ending with folding,   All 8-10 x each - cues given to keep core tight for improved trunk stabilization  Pt requires the buoyancy of water for active assisted exercises with buoyancy supported for strengthening and AROM exercises. Hydrostatic pressure also supports joints by unweighting joint load by at least 50 % in 3-4 feet depth water. 80% in chest to neck deep water. Water will provide assistance with movement using the current and laminar flow while the buoyancy reduces weight bearing. Pt requires the viscosity of the water for resistance with strengthening exercises.   [facility] Adult PT Treatment:                                                DATE: 03/15/2022 Aquatic therapy at [address] - therapeutic pool temp 92 degrees Pt enters building  ambulating with walking stick  Treatment took place in water 3.8 to  4 ft 8 in.feet deep depending upon activity.  Pt entered and exited the pool via stair and handrails independently  Pt pain level 8/10 at initiation of water walking.  Therapeutic Exercise: Walking forward/backwards/sidestepping Sidestepping with rainbow DB shoulder abd/adduction x2 laps Stretching: Hip flexor stretch on bottom step x30" BIL Hamstring stretch on bottom step x30" BIL At edge of pool, pt performed LE exercise: Standing march x20 BIL Heel raises 2x20 Hamstring curl x20 BIL Hip flexion -> extension 2x10 BIL Hip abd/add x20 BIL Squats x20 Seated on submerged bench: Bicycle kicks x1' Flutter kicks x1' Scissor kicks x1'  Pt requires the buoyancy of water for active assisted exercises with buoyancy supported for strengthening and AROM exercises. Hydrostatic pressure also supports joints by unweighting joint load by at least 50 % in 3-4 feet depth water. 80% in chest to neck deep water. Water will provide assistance with movement using the current and laminar flow while the buoyancy reduces weight bearing. Pt requires the viscosity of the water for resistance with strengthening exercises.  TREATMENT 03/14/22:  Aquatic therapy at [address] - therapeutic pool temp 92 degrees Pt enters building independently.  Treatment took place in water 3.8 to  4 ft 8 in.feet deep depending upon activity.  Pt entered and exited the pool via stair and handrails    Aquatic Therapy:  Water walking for warm up  Stretching: Hip flexor on bottom step  x30" BIL Hamstring stretch on bottom step x30" BIL Figure 4 squat stretch, BIL UE support 2x30" BIL (NT)  At edge of pool, pt performed LE exercise: Standing march x20 Heel raises 2x20 Hamstring curl x20 BIL Hip flexion -> extension 2x10 BIL Hip abd/add x20 BIL Squats x20 Mini lunge lateral x20 Rainbow DB push down - abd, flexion, alt abd, alt flexion -  20x ea  Pt requires the buoyancy of water for active assisted exercises with buoyancy supported for strengthening and AROM exercises. Hydrostatic pressure also supports joints by unweighting joint load by at least 50 % in 3-4 feet depth water. 80% in chest to neck deep water. Water will provide assistance with movement using the current and laminar flow while the buoyancy reduces weight bearing. Pt requires the viscosity of the water for resistance with strengthening exercises.   ASSESSMENT:   CLINICAL IMPRESSION:  Mr Kluth presents to aquatic PT with 9/10 pain in low back and sensitivity to light touch over surgical site.  Discussed desensitization techniques and importance of movement for healing. PT spent extra time with education of hip hinge and pt was able to perform correctly at end of time with decreased pulling and stretching pain in low back. Session today focused on proximal hip and core strengthening as well as correct body mechanics in the aquatic environment for use of buoyancy to offload joints and the viscosity of water as resistance during therapeutic exercise.  Patient continues to benefit from skilled PT services on land and aquatic based and should be progressed as able to improve functional independence.    OBJECTIVE IMPAIRMENTS: Abnormal gait, decreased activity tolerance, decreased balance, decreased endurance, decreased knowledge of condition, decreased mobility, difficulty walking, decreased ROM, decreased strength, increased muscle spasms, impaired flexibility, improper body mechanics, postural dysfunction, and pain.    ACTIVITY LIMITATIONS: carrying, lifting, bending, sitting, standing, squatting, stairs, and bed mobility   PERSONAL FACTORS: Fitness, Past/current experiences, Time since onset of injury/illness/exacerbation, and 1 comorbidity: previous lumbar surgeries   are also affecting patient's functional outcome.    REHAB POTENTIAL: Fair based on multiple surgeries   and chronicity of symptoms   CLINICAL DECISION MAKING: Evolving/moderate complexity   EVALUATION COMPLEXITY: Low     GOALS: Goals reviewed with patient? Yes   SHORT TERM GOALS: Target date: 03/19/2022   Patient to demonstrate independence in HEP  Baseline:[id number] Goal status: INITIAL   2.  Initiate aquatics program Baseline: TBD Goal status: MET Initiated 03/14/2022     LONG TERM GOALS: Target date: 04/02/2022   70d L SLR Baseline: 50d SLR Goal status: INITIAL   2.  Increase 30s stand test to 5 reps arms crossed Baseline: 1 stand arms crossed Goal status: INITIAL   3.  Increase BLE strength to 4-/5 Baseline:  MMT Right eval Left eval  Hip flexion 3+ 3+  Hip extension 3+ 3+  Hip abduction 3+ 3+  Hip adduction      Hip internal rotation      Hip external rotation      Knee flexion 3+ 3+  Knee extension 3+ 3+  Ankle dorsiflexion      Ankle plantarflexion 3+ 3+    Goal status: INITIAL     PLAN:   PT FREQUENCY: 2x/week   PT DURATION: 4 weeks   PLANNED INTERVENTIONS: Therapeutic exercises, Therapeutic activity, Neuromuscular re-education, Balance training, Gait training, Patient/Family education, Self Care, Joint mobilization, Stair training, DME instructions, Aquatic Therapy, Dry Needling, and Re-evaluation.   PLAN FOR NEXT SESSION: review  and update HEP, initiate aquatic PT, LE strengthening    Voncille Lo, PT, [facility] Certified Exercise Expert for the Aging Adult  03/20/22 4:32 PM Phone: [phone] Fax: [phone]

## 2022-03-20 ENCOUNTER — Other Ambulatory Visit: Payer: Self-pay | Admitting: Physical Medicine & Rehabilitation

## 2022-03-20 ENCOUNTER — Other Ambulatory Visit (INDEPENDENT_AMBULATORY_CARE_PROVIDER_SITE_OTHER): Payer: Self-pay | Admitting: Primary Care

## 2022-03-20 ENCOUNTER — Ambulatory Visit: Payer: Medicare Other | Admitting: Physical Therapy

## 2022-03-20 ENCOUNTER — Encounter: Payer: Self-pay | Admitting: Physical Therapy

## 2022-03-20 ENCOUNTER — Other Ambulatory Visit: Payer: Self-pay

## 2022-03-20 DIAGNOSIS — I10 Essential (primary) hypertension: Secondary | ICD-10-CM

## 2022-03-20 DIAGNOSIS — R531 Weakness: Secondary | ICD-10-CM

## 2022-03-20 DIAGNOSIS — M5459 Other low back pain: Secondary | ICD-10-CM

## 2022-03-20 DIAGNOSIS — R6889 Other general symptoms and signs: Secondary | ICD-10-CM | POA: Diagnosis not present

## 2022-03-20 DIAGNOSIS — Z760 Encounter for issue of repeat prescription: Secondary | ICD-10-CM

## 2022-03-20 DIAGNOSIS — E782 Mixed hyperlipidemia: Secondary | ICD-10-CM

## 2022-03-20 MED ORDER — PREGABALIN 200 MG PO CAPS
200.0000 mg | ORAL_CAPSULE | Freq: Three times a day (TID) | ORAL | 1 refills | Status: DC
  Filled 2022-03-20 – 2022-05-14 (×2): qty 90, 30d supply, fill #0
  Filled 2022-06-12: qty 90, 30d supply, fill #1

## 2022-03-20 MED ORDER — AMLODIPINE BESYLATE 10 MG PO TABS
10.0000 mg | ORAL_TABLET | Freq: Every day | ORAL | 0 refills | Status: DC
  Filled 2022-03-20 – 2022-05-14 (×3): qty 30, 30d supply, fill #0

## 2022-03-20 MED ORDER — ATORVASTATIN CALCIUM 40 MG PO TABS
40.0000 mg | ORAL_TABLET | Freq: Every day | ORAL | 0 refills | Status: DC
  Filled 2022-03-20 – 2022-05-14 (×3): qty 30, 30d supply, fill #0

## 2022-03-20 MED ORDER — EMPAGLIFLOZIN 25 MG PO TABS
25.0000 mg | ORAL_TABLET | Freq: Every day | ORAL | 0 refills | Status: DC
  Filled 2022-03-20 – 2022-05-14 (×3): qty 30, 30d supply, fill #0

## 2022-03-20 NOTE — Telephone Encounter (Signed)
Requested Prescriptions  Pending Prescriptions Disp Refills   amLODipine (NORVASC) 10 MG tablet 30 tablet 0    Sig: Take 1 tablet (10 mg total) by mouth once daily.     Cardiovascular: Calcium Channel Blockers 2 Passed - 03/20/2022  8:08 AM      Passed - Last BP in normal range    BP Readings from Last 1 Encounters:  03/11/22 120/82         Passed - Last Heart Rate in normal range    Pulse Readings from Last 1 Encounters:  03/11/22 91         Passed - Valid encounter within last 6 months    Recent Outpatient Visits           1 month ago Mixed hyperlipidemia   [place] Juluis Mire P, NP   4 months ago Diabetes mellitus without complication ([place])   [place], Michelle P, NP   6 months ago Preseptal cellulitis of right eye   [facility] at [facility], Cari S, PA-C   6 months ago Preseptal cellulitis of right eye   [facility] at [facility], Cari S, PA-C   10 months ago Diabetes mellitus without complication ([place])   [facility] Kerin Perna, NP       Future Appointments             In 2 weeks Oletta Lamas, Milford Cage, NP [place]   In 4 months [place], Milford Cage, NP [facility]             atorvastatin (LIPITOR) 40 MG tablet 30 tablet 0    Sig: Take 1 tablet (40 mg total) by mouth daily.     Cardiovascular:  Antilipid - Statins Failed - 03/20/2022  8:08 AM      Failed - Lipid Panel in normal range within the last 12 months    Cholesterol, Total  Date Value Ref Range Status  10/25/2021 220 (H) 100 - 199 mg/dL Final   LDL Chol Calc (NIH)  Date Value Ref Range Status  10/25/2021 158 (H) 0 - 99 mg/dL Final   HDL  Date Value Ref Range Status  10/25/2021 44 >39 mg/dL Final   Triglycerides  Date Value Ref Range Status  10/25/2021 98 0 - 149 mg/dL Final         Passed - Patient is not pregnant       Passed - Valid encounter within last 12 months    Recent Outpatient Visits           1 month ago Mixed hyperlipidemia   [place] Juluis Mire P, NP   4 months ago Diabetes mellitus without complication ([place])   [place], Michelle P, NP   6 months ago Preseptal cellulitis of right eye   [facility] at [facility], [place], PA-C   6 months ago Preseptal cellulitis of right eye   Primary Care at [facility], [place], PA-C   10 months ago Diabetes mellitus without complication ([place])   [place], NP       Future Appointments             In 2 weeks Oletta Lamas, Milford Cage, NP [place]   In 4 months Kerin Perna, NP [facility]  [facility]             empagliflozin (JARDIANCE) 25 MG TABS tablet 30 tablet 0    Sig: Take 1 tablet (25 mg total) by mouth once daily before breakfast.     Endocrinology:  Diabetes - SGLT2 Inhibitors Passed - 03/20/2022  8:08 AM      Passed - Cr in normal range and within 360 days    Creatinine, Ser  Date Value Ref Range Status  12/24/2021 0.70 0.61 - 1.24 mg/dL Final         Passed - HBA1C is between 0 and 7.9 and within 180 days    HbA1c, POC (prediabetic range)  Date Value Ref Range Status  01/25/2021 6.0 5.7 - 6.4 % Final   HbA1c, POC (controlled diabetic range)  Date Value Ref Range Status  01/25/2022 5.6 0.0 - 7.0 % Final         Passed - eGFR in normal range and within 360 days    GFR calc Af Amer  Date Value Ref Range Status  12/22/2019 128 >59 mL/min/1.73 Final    Comment:    **[facility] currently reports eGFR in compliance with the current**   recommendations of the [employer]. [facility] will   update reporting as new guidelines are published from the NKF-ASN   Task force.    GFR, Estimated  Date Value Ref Range Status  12/24/2021 >60 >60 mL/min Final     Comment:    (NOTE) Calculated using the CKD-EPI Creatinine Equation (2021)    eGFR  Date Value Ref Range Status  10/25/2021 110 >59 mL/min/1.73 Final         Passed - Valid encounter within last 6 months    Recent Outpatient Visits           1 month ago Mixed hyperlipidemia   [place] Juluis Mire P, NP   4 months ago Diabetes mellitus without complication ([place])   [place] Kerin Perna, NP   6 months ago Preseptal cellulitis of right eye   Primary Care at [facility], Cari S, PA-C   6 months ago Preseptal cellulitis of right eye   [facility] Care at [facility], Cari S, PA-C   10 months ago Diabetes mellitus without complication ([place])   [facility] Kerin Perna, NP       Future Appointments             In 2 weeks Oletta Lamas, Milford Cage, NP [place]   In 4 months [place], Milford Cage, NP [facility]             ergocalciferol (VITAMIN D2) 1.25 MG (50000 UT) capsule 12 capsule 0    Sig: Take 1 capsule (50,000 Units total) by mouth once a week.     Endocrinology:  Vitamins - Vitamin D Supplementation 2 Failed - 03/20/2022  8:08 AM      Failed - Manual Review: Route requests for 50,000 IU strength to the provider      Failed - Vitamin D in normal range and within 360 days    Vit D, 25-Hydroxy  Date Value Ref Range Status  10/25/2021 10.3 (L) 30.0 - 100.0 ng/mL Final    Comment:    Vitamin D deficiency has been defined by the [place] practice guideline as a level of serum 25-OH  vitamin D less than 20 ng/mL (1,2). The Endocrine Society went on to further define vitamin D insufficiency as a level between 21 and 29 ng/mL (2). 1. IOM (Institute of Medicine). 2010. Dietary reference    intakes for calcium and D. [place]: The    [employer]. 2. Holick MF,  Binkley Hull, Bischoff-Ferrari HA, et al.    Evaluation, treatment, and prevention of vitamin D    deficiency: an Endocrine Society clinical practice    guideline. JCEM. 2011 Jul; 96(7):1911-30.          Passed - Ca in normal range and within 360 days    Calcium  Date Value Ref Range Status  12/24/2021 9.0 8.9 - 10.3 mg/dL Final         Passed - Valid encounter within last 12 months    Recent Outpatient Visits           1 month ago Mixed hyperlipidemia   [place] Juluis Mire P, NP   4 months ago Diabetes mellitus without complication ([place])   [place] Kerin Perna, NP   6 months ago Preseptal cellulitis of right eye   Primary Care at [facility], [place], PA-C   6 months ago Preseptal cellulitis of right eye   Primary Care at [facility], [place], PA-C   10 months ago Diabetes mellitus without complication ([place])   [place], Michelle P, NP       Future Appointments             In 2 weeks Oletta Lamas, Milford Cage, NP [place]   In 4 months Oletta Lamas Milford Cage, NP [place]

## 2022-03-20 NOTE — Telephone Encounter (Signed)
Requested medication (s) are due for refill today: a little early  Requested medication (s) are on the active medication list: yes  Last refill:  02/05/22 #12 with 0 RF  Future visit scheduled: 04/09/22, seen 01/25/22  Notes to clinic:  This medication can not be delegated, please assess.        Requested Prescriptions  Pending Prescriptions Disp Refills   ergocalciferol (VITAMIN D2) 1.25 MG (50000 UT) capsule 12 capsule 0    Sig: Take 1 capsule (50,000 Units total) by mouth once a week.     Endocrinology:  Vitamins - Vitamin D Supplementation 2 Failed - 03/20/2022  8:08 AM      Failed - Manual Review: Route requests for 50,000 IU strength to the provider      Failed - Vitamin D in normal range and within 360 days    Vit D, 25-Hydroxy  Date Value Ref Range Status  10/25/2021 10.3 (L) 30.0 - 100.0 ng/mL Final    Comment:    Vitamin D deficiency has been defined by the [place] practice guideline as a level of serum 25-OH vitamin D less than 20 ng/mL (1,2). The Endocrine Society went on to further define vitamin D insufficiency as a level between 21 and 29 ng/mL (2). 1. IOM (Institute of Medicine). 2010. Dietary reference    intakes for calcium and D. [place]: The    [employer]. 2. Holick MF, Binkley I[place], Bischoff-Ferrari HA, et al.    Evaluation, treatment, and prevention of vitamin D    deficiency: an Endocrine Society clinical practice    guideline. JCEM. 2011 Jul; 96(7):1911-30.          Passed - Ca in normal range and within 360 days    Calcium  Date Value Ref Range Status  12/24/2021 9.0 8.9 - 10.3 mg/dL Final         Passed - Valid encounter within last 12 months    Recent Outpatient Visits           1 month ago Mixed hyperlipidemia   [place] Juluis Mire P, NP   4 months ago Diabetes mellitus without complication ([place])   [place] Kerin Perna, NP   6 months ago Preseptal cellulitis of right eye   [facility] at [facility], Cari S, PA-C   6 months ago Preseptal cellulitis of right eye   [facility] at [facility], Cari S, PA-C   10 months ago Diabetes mellitus without complication ([place])   [facility] Kerin Perna, NP       Future Appointments             In 2 weeks Oletta Lamas, Milford Cage, NP [place]   In 4 months Edwards, Milford Cage, NP [facility]            Signed Prescriptions Disp Refills   amLODipine (NORVASC) 10 MG tablet 30 tablet 0    Sig: Take 1 tablet (10 mg total) by mouth once daily.     Cardiovascular: Calcium Channel Blockers 2 Passed - 03/20/2022  8:08 AM      Passed - Last BP in normal range    BP Readings from Last 1 Encounters:  03/11/22 120/82         Passed - Last Heart Rate in normal range    Pulse Readings from  Last 1 Encounters:  03/11/22 91         Passed - Valid encounter within last 6 months    Recent Outpatient Visits           1 month ago Mixed hyperlipidemia   [place] Juluis Mire P, NP   4 months ago Diabetes mellitus without complication ([place])   [place] Kerin Perna, NP   6 months ago Preseptal cellulitis of right eye   Primary Care at [facility], Cari S, PA-C   6 months ago Preseptal cellulitis of right eye   Primary Care at [facility], Cari S, PA-C   10 months ago Diabetes mellitus without complication ([place])   [facility] Kerin Perna, NP       Future Appointments             In 2 weeks Oletta Lamas, Milford Cage, NP [place]   In 4 months Edwards, Milford Cage, NP [facility]             atorvastatin (LIPITOR) 40 MG tablet 30 tablet 0    Sig: Take 1 tablet (40 mg total) by mouth daily.      Cardiovascular:  Antilipid - Statins Failed - 03/20/2022  8:08 AM      Failed - Lipid Panel in normal range within the last 12 months    Cholesterol, Total  Date Value Ref Range Status  10/25/2021 220 (H) 100 - 199 mg/dL Final   LDL Chol Calc (NIH)  Date Value Ref Range Status  10/25/2021 158 (H) 0 - 99 mg/dL Final   HDL  Date Value Ref Range Status  10/25/2021 44 >39 mg/dL Final   Triglycerides  Date Value Ref Range Status  10/25/2021 98 0 - 149 mg/dL Final         Passed - Patient is not pregnant      Passed - Valid encounter within last 12 months    Recent Outpatient Visits           1 month ago Mixed hyperlipidemia   [place] Juluis Mire P, NP   4 months ago Diabetes mellitus without complication ([place])   [place], Michelle P, NP   6 months ago Preseptal cellulitis of right eye   [facility] at [facility], Cari S, PA-C   6 months ago Preseptal cellulitis of right eye   [facility] at [facility], [place], PA-C   10 months ago Diabetes mellitus without complication ([place])   [facility] Kerin Perna, NP       Future Appointments             In 2 weeks Oletta Lamas, Milford Cage, NP [place]   In 4 months Edwards, Milford Cage, NP [facility]             empagliflozin (JARDIANCE) 25 MG TABS tablet 30 tablet 0    Sig: Take 1 tablet (25 mg total) by mouth once daily before breakfast.     Endocrinology:  Diabetes - SGLT2 Inhibitors Passed - 03/20/2022  8:08 AM      Passed - Cr in normal range and within 360 days    Creatinine, Ser  Date Value Ref Range Status  12/24/2021 0.70 0.61 - 1.24 mg/dL Final  Passed - HBA1C is between 0 and 7.9 and within 180 days    HbA1c, POC (prediabetic range)  Date Value Ref Range Status  01/25/2021 6.0 5.7 - 6.4 % Final   HbA1c, POC (controlled diabetic range)   Date Value Ref Range Status  01/25/2022 5.6 0.0 - 7.0 % Final         Passed - eGFR in normal range and within 360 days    GFR calc Af Amer  Date Value Ref Range Status  12/22/2019 128 >59 mL/min/1.73 Final    Comment:    **[facility] currently reports eGFR in compliance with the current**   recommendations of the [employer]. [facility] will   update reporting as new guidelines are published from the NKF-ASN   Task force.    GFR, Estimated  Date Value Ref Range Status  12/24/2021 >60 >60 mL/min Final    Comment:    (NOTE) Calculated using the CKD-EPI Creatinine Equation (2021)    eGFR  Date Value Ref Range Status  10/25/2021 110 >59 mL/min/1.73 Final         Passed - Valid encounter within last 6 months    Recent Outpatient Visits           1 month ago Mixed hyperlipidemia   [place] Juluis Mire P, NP   4 months ago Diabetes mellitus without complication ([place])   [place] Kerin Perna, NP   6 months ago Preseptal cellulitis of right eye   [facility] at [facility], [place], PA-C   6 months ago Preseptal cellulitis of right eye   [facility] at [facility], [place], PA-C   10 months ago Diabetes mellitus without complication ([place])   [place], Michelle P, NP       Future Appointments             In 2 weeks Oletta Lamas, [place] Cage, NP [place]   In 4 months Oletta Lamas Milford Cage, NP [place]

## 2022-03-21 ENCOUNTER — Ambulatory Visit: Payer: Medicare Other | Admitting: Physical Therapy

## 2022-03-21 ENCOUNTER — Other Ambulatory Visit: Payer: Self-pay

## 2022-03-21 NOTE — Therapy (Deleted)
OUTPATIENT PHYSICAL THERAPY TREATMENT NOTE   Patient Name: Scott Tucker MRN: [id number] DOB:07-08-1965, 56 y.o., male Today's Date: 03/21/2022  PCP: Kerin Perna, NP   REFERRING PROVIDER: Jessy Oto, MD      Past Medical History:  Diagnosis Date   Anginal pain [facility])    Anxiety    Arthritis    Chronic back pain    Coronary artery disease    Diabetes mellitus without complication ([place])    type 2   Dyspnea    Headache    HTN (hypertension)    Neuromuscular disorder [facility])    Past Surgical History:  Procedure Laterality Date   ANTERIOR CERVICAL DECOMP/DISCECTOMY FUSION N/A 12/04/2020   Procedure: ANTERIOR CERVICAL DISCECTOMY FUSION C3-4, C4-5, ALLOGRAFT, PLATE;  Surgeon: Marybelle Killings, MD;  Location: [place];  Service: Orthopedics;  Laterality: N/A;  needs [place] and 2003(dr kritzer)   CARDIAC CATHETERIZATION     SHOULDER SURGERY Right    2017   TONSILLECTOMY     removed as a child   Patient Active Problem List   Diagnosis Date Noted   Depressive reaction    Leukocytosis    Neuropathic pain    Spondylolisthesis, lumbar region 12/04/2021    Class: Chronic   Degenerative disc disease, lumbar 12/04/2021   Lumbar stenosis 12/04/2021   Status post lumbar spinal fusion 12/04/2021   Spinal stenosis of lumbar region 05/30/2021   S/P cervical spinal fusion 12/18/2020   Chronic radicular cervical pain 05/01/2020   Foraminal stenosis of cervical region (right) 05/01/2020   Chronic pain syndrome 05/01/2020   History of lumbar fusion 05/01/2020   HTN (hypertension)    Diabetes mellitus without complication (H[facility])    Chronic back pain    Coronary artery disease    CAD (coronary artery disease) 03/22/2019   Hypertension 02/23/2019   Hyperlipidemia 02/23/2019   Chest pain 02/22/2019    THERAPY DIAG:  No diagnosis found.   Rationale for Evaluation and Treatment Rehabilitation  REFERRING DIAG: M51.36 (ICD-10-CM) - Degenerative disc  disease, lumbar M43.16 (ICD-10-CM) - Spondylolisthesis, lumbar region    PERTINENT HISTORY: Assessment & Plan:2.5 months post op L3-4 and L4-5 left TLIF for spinal stenosis with congenital foramenal narrowing. Has bilateral hypesthesias medial thighs and calfs.    Chief Complaint:       Chief Complaint  Patient presents with   Lower Back - Follow-up  Bilateral leg numbness and tingling.  SLR is negative  Weak right more than left Quadriceps. Remainder intact. CT with right more than left foramenal narrowing L3 and L4  PRECAUTIONS/RESTRICTIONS:    Avoid frequent bending and stooping  No lifting greater than 10 lbs. May use ice or moist heat for pain. Weight loss is of benefit. Will start a pool exercise rehabilitation program. Presently with right quadriceps weakness. Obtain MRI to assess for residual foramenal narrowing L3 and L4.  Exercise is important to improve your indurance and does allow people to function better inspite of back pain  SUBJECTIVE:  ***  PAIN:  Are you having pain? Yes: NPRS scale: 8/10 Pain location: low back and BLEs Pain description: ache and paresthesias Aggravating factors: activity Relieving factors: marijuana  OBJECTIVE: (objective measures completed at initial evaluation unless otherwise dated)  DIAGNOSTIC FINDINGS:  MRI 03/02/22 results pending   PATIENT SURVEYS:  FOTO TBD   SCREENING FOR RED FLAGS: negative  SENSATION: Not tested   MUSCLE LENGTH: Hamstrings: Right 70 deg; Left 50 deg T   POSTURE:  not tested    PALPATION: Not tested   LUMBAR ROM: deferred per post op restrictions   AROM eval  Flexion    Extension    Right lateral flexion    Left lateral flexion    Right rotation    Left rotation     (Blank rows = not tested)   LOWER EXTREMITY ROM:      Active  Right eval Left eval  Hip flexion      Hip extension      Hip abduction      Hip adduction      Hip internal rotation      Hip  external rotation      Knee flexion [facility] WFL  Knee extension [facility]  Ankle dorsiflexion [facility] WFL  Ankle plantarflexion [facility] WFL  Ankle inversion      Ankle eversion       (Blank rows = not tested)   LOWER EXTREMITY MMT:     MMT Right eval Left eval  Hip flexion 3+ 3+  Hip extension 3+ 3+  Hip abduction 3+ 3+  Hip adduction      Hip internal rotation      Hip external rotation      Knee flexion 3+ 3+  Knee extension 3+ 3+  Ankle dorsiflexion      Ankle plantarflexion 3+ 3+  Ankle inversion      Ankle eversion      Core/trunk  3 3   (Blank rows = not tested)   LUMBAR SPECIAL TESTS:  Straight leg raise test: Negative and Slump test: Negative   FUNCTIONAL TESTS:  30 seconds chair stand test 1 stand arms crossed    GAIT: Distance walked: [id number] Assistive device utilized: None Level of assistance: Complete Independence Comments: antalgic gait with lumbar support   TREATMENT 03/21/22:  [address] therapeutic pool temp 92 degrees Pt enters building independently.  Treatment took place in water 3.8 to  4 ft 8 in.feet deep depending upon activity.  Pt entered and exited the pool via stair and handrails    Aquatic Therapy:  Water walking for warm up  Stretching: Hip flexor on bottom step x30" BIL Hamstring stretch on bottom step x30" BIL Figure 4 squat stretch, BIL UE support 2x30" BIL (NT)  At edge of pool, pt performed LE exercise: Standing march x20 Heel raises 2x20 Hamstring curl x20 BIL Hip flexion -> extension 2x10 BIL Hip abd/add x20 BIL Squats x20 Mini lunge lateral x20 Rainbow DB push down - abd, flexion, alt abd, alt flexion - 20x ea  Pt requires the buoyancy of water for active assisted exercises with buoyancy supported for strengthening and AROM exercises. Hydrostatic pressure also supports joints by unweighting joint load by at least 50 % in 3-4 feet depth water. 80% in chest to neck deep water. Water will  provide assistance with movement using the current and laminar flow while the buoyancy reduces weight bearing. Pt requires the viscosity of the water for resistance with strengthening exercises.  [facility]                                                DATE: 03-20-22 Aquatic therapy at [employer]  [place] - therapeutic pool temp 92 degrees Pt enters building ambulating with walking stick  Treatment took place in water 3.8 to  4 ft 8 in.feet deep depending upon activity.  Pt entered and exited the pool via stair and handrails independently  Pt pain level 8-9/10 at initiation of water walking. Pt 6/10 at end of session  Mr [place] was educated on  beneficial therapeutic effects of water while ambulating to acclimate to water walking forward, backward and side stepping. Pt educated on neutral posture and hip hinging in seated position with water at chest level x 10 with stretch to low back and then x 10 with back at pool wall at external cue, VC for neck tucked to prevent hyperextension. Pt needed extra time  and TC to get into proper position  Therapeutic Exercise: Walking forward/backwards/sidestepping Sidestepping with rainbow DB shoulder abd/adduction x2 laps Stretching: Runners Stretch x 30" x 2 BIL Hamstring stretch x 30" x 2 BIL Gentle trunk rotation to pt tolerance using pool noodle At edge of pool, pt performed LE exercise: Standing march x20 BIL Heel raises 2x20 Hip flexion -> extension 2x10 BIL Hip abd/add x20 BIL Squats x20 Seated on submerged bench: Bicycle kicks x1' Reverse bicycle x 1' Flutter kicks x1' Scissor kicks x1'  Ai Chi  introduction for pt with demo and encouraged  deep breathing and pain control using slow controlled Tai chi like movements with shoulders submerged. Emphasis on shoulder movement / gentle trunk motion and abdominal engagement Ai Chi posture of contemplating to floating then uplifting to enclosing and ending with folding,   All  8-10 x each - cues given to keep core tight for improved trunk stabilization  Pt requires the buoyancy of water for active assisted exercises with buoyancy supported for strengthening and AROM exercises. Hydrostatic pressure also supports joints by unweighting joint load by at least 50 % in 3-4 feet depth water. 80% in chest to neck deep water. Water will provide assistance with movement using the current and laminar flow while the buoyancy reduces weight bearing. Pt requires the viscosity of the water for resistance with strengthening exercises.   [facility] Adult PT Treatment:                                                DATE: 03/15/2022 Aquatic therapy at [address] - therapeutic pool temp 92 degrees Pt enters building ambulating with walking stick  Treatment took place in water 3.8 to  4 ft 8 in.feet deep depending upon activity.  Pt entered and exited the pool via stair and handrails independently  Pt pain level 8/10 at initiation of water walking.  Therapeutic Exercise: Walking forward/backwards/sidestepping Sidestepping with rainbow DB shoulder abd/adduction x2 laps Stretching: Hip flexor stretch on bottom step x30" BIL Hamstring stretch on bottom step x30" BIL At edge of pool, pt performed LE exercise: Standing march x20 BIL Heel raises 2x20 Hamstring curl x20 BIL Hip flexion -> extension 2x10 BIL Hip abd/add x20 BIL Squats x20 Seated on submerged bench: Bicycle kicks x1' Flutter kicks x1' Scissor kicks x1'  Pt requires the buoyancy of water for active assisted exercises with buoyancy supported for strengthening and AROM exercises. Hydrostatic pressure also supports joints by unweighting joint load by at least 50 % in 3-4 feet depth water. 80% in chest to neck deep water. Water will  provide assistance with movement using the current and laminar flow while the buoyancy reduces weight bearing. Pt requires the viscosity of the water for resistance with strengthening  exercises.  TREATMENT 03/14/22:  Aquatic therapy at [address] - therapeutic pool temp 92 degrees Pt enters building independently.  Treatment took place in water 3.8 to  4 ft 8 in.feet deep depending upon activity.  Pt entered and exited the pool via stair and handrails    Aquatic Therapy:  Water walking for warm up  Stretching: Hip flexor on bottom step x30" BIL Hamstring stretch on bottom step x30" BIL Figure 4 squat stretch, BIL UE support 2x30" BIL (NT)  At edge of pool, pt performed LE exercise: Standing march x20 Heel raises 2x20 Hamstring curl x20 BIL Hip flexion -> extension 2x10 BIL Hip abd/add x20 BIL Squats x20 Mini lunge lateral x20 Rainbow DB push down - abd, flexion, alt abd, alt flexion - 20x ea  Pt requires the buoyancy of water for active assisted exercises with buoyancy supported for strengthening and AROM exercises. Hydrostatic pressure also supports joints by unweighting joint load by at least 50 % in 3-4 feet depth water. 80% in chest to neck deep water. Water will provide assistance with movement using the current and laminar flow while the buoyancy reduces weight bearing. Pt requires the viscosity of the water for resistance with strengthening exercises.   ASSESSMENT:   CLINICAL IMPRESSION:  Session today focused on *** in the aquatic environment for use of buoyancy to offload joints and the viscosity of water as resistance during therapeutic exercise.  ***.  Patient was able to tolerate all prescribed exercises in the aquatic environment with no adverse effects and reports ***/10 pain at the end of the session. Patient continues to benefit from skilled PT services on land and aquatic based and should be progressed as able to improve functional independence.      OBJECTIVE IMPAIRMENTS: Abnormal gait, decreased activity tolerance, decreased balance, decreased endurance, decreased knowledge of condition, decreased mobility, difficulty  walking, decreased ROM, decreased strength, increased muscle spasms, impaired flexibility, improper body mechanics, postural dysfunction, and pain.    ACTIVITY LIMITATIONS: carrying, lifting, bending, sitting, standing, squatting, stairs, and bed mobility   PERSONAL FACTORS: Fitness, Past/current experiences, Time since onset of injury/illness/exacerbation, and 1 comorbidity: previous lumbar surgeries   are also affecting patient's functional outcome.    REHAB POTENTIAL: Fair based on multiple surgeries  and chronicity of symptoms   CLINICAL DECISION MAKING: Evolving/moderate complexity   EVALUATION COMPLEXITY: Low     GOALS: Goals reviewed with patient? Yes   SHORT TERM GOALS: Target date: 03/19/2022   Patient to demonstrate independence in HEP  Baseline:[id number] Goal status: INITIAL   2.  Initiate aquatics program Baseline: TBD Goal status: MET Initiated 03/14/2022     LONG TERM GOALS: Target date: 04/02/2022   70d L SLR Baseline: 50d SLR Goal status: INITIAL   2.  Increase 30s stand test to 5 reps arms crossed Baseline: 1 stand arms crossed Goal status: INITIAL   3.  Increase BLE strength to 4-/5 Baseline:  MMT Right eval Left eval  Hip flexion 3+ 3+  Hip extension 3+ 3+  Hip abduction 3+ 3+  Hip adduction      Hip internal rotation      Hip external rotation      Knee flexion 3+ 3+  Knee extension 3+ 3+  Ankle dorsiflexion      Ankle plantarflexion 3+ 3+  Goal status: INITIAL     PLAN:   PT FREQUENCY: 2x/week   PT DURATION: 4 weeks   PLANNED INTERVENTIONS: Therapeutic exercises, Therapeutic activity, Neuromuscular re-education, Balance training, Gait training, Patient/Family education, Self Care, Joint mobilization, Stair training, DME instructions, Aquatic Therapy, Dry Needling, and Re-evaluation.   PLAN FOR NEXT SESSION: review and update HEP, initiate aquatic PT, LE strengthening    Shearon Balo PT, DPT 03/21/22 7:18 AM Phone:  [phone] Fax: [phone]

## 2022-03-21 NOTE — Telephone Encounter (Signed)
Will forward to provider  

## 2022-03-22 ENCOUNTER — Other Ambulatory Visit: Payer: Self-pay

## 2022-03-25 ENCOUNTER — Other Ambulatory Visit: Payer: Self-pay

## 2022-03-25 ENCOUNTER — Other Ambulatory Visit (INDEPENDENT_AMBULATORY_CARE_PROVIDER_SITE_OTHER): Payer: Self-pay | Admitting: Primary Care

## 2022-03-25 ENCOUNTER — Encounter: Payer: Medicare Other | Admitting: Physical Medicine & Rehabilitation

## 2022-03-25 MED ORDER — VITAMIN D3 50 MCG (2000 UT) PO CAPS
2000.0000 [IU] | ORAL_CAPSULE | Freq: Every day | ORAL | 1 refills | Status: DC
  Filled 2022-03-25 – 2022-06-12 (×2): qty 90, 90d supply, fill #0

## 2022-03-26 ENCOUNTER — Other Ambulatory Visit: Payer: Self-pay

## 2022-03-26 DIAGNOSIS — D72829 Elevated white blood cell count, unspecified: Secondary | ICD-10-CM | POA: Diagnosis not present

## 2022-03-26 DIAGNOSIS — M48061 Spinal stenosis, lumbar region without neurogenic claudication: Secondary | ICD-10-CM | POA: Diagnosis not present

## 2022-03-26 DIAGNOSIS — M792 Neuralgia and neuritis, unspecified: Secondary | ICD-10-CM | POA: Diagnosis not present

## 2022-03-27 ENCOUNTER — Ambulatory Visit: Payer: Medicare Other | Admitting: Physical Therapy

## 2022-03-27 ENCOUNTER — Encounter: Payer: Self-pay | Admitting: Physical Therapy

## 2022-03-27 DIAGNOSIS — R6889 Other general symptoms and signs: Secondary | ICD-10-CM | POA: Diagnosis not present

## 2022-03-27 DIAGNOSIS — Z981 Arthrodesis status: Secondary | ICD-10-CM

## 2022-03-27 DIAGNOSIS — M792 Neuralgia and neuritis, unspecified: Secondary | ICD-10-CM

## 2022-03-27 DIAGNOSIS — R531 Weakness: Secondary | ICD-10-CM

## 2022-03-27 DIAGNOSIS — M5459 Other low back pain: Secondary | ICD-10-CM

## 2022-03-27 NOTE — Therapy (Signed)
OUTPATIENT PHYSICAL THERAPY TREATMENT NOTE   Patient Name: Scott Tucker MRN: [id number] DOB:03/18/1966, 56 y.o., male Today's Date: 03/27/2022  PCP: Kerin Perna, NP   REFERRING PROVIDER: Jessy Oto, MD   PT End of Session - 03/27/22 1454     Visit Number 5    Number of Visits 9    Date for PT Re-Evaluation 05/05/22    Authorization Type BC[employer]    PT Start Time 1501    PT Stop Time 1547    PT Time Calculation (min) 46 min    Activity Tolerance Patient limited by pain    Behavior During Therapy WFL for tasks assessed/performed              Past Medical History:  Diagnosis Date   Anginal pain ([place])    Anxiety    Arthritis    Chronic back pain    Coronary artery disease    Diabetes mellitus without complication ([place])    type 2   Dyspnea    Headache    HTN (hypertension)    Neuromuscular disorder ([place])    Past Surgical History:  Procedure Laterality Date   ANTERIOR CERVICAL DECOMP/DISCECTOMY FUSION N/A 12/04/2020   Procedure: ANTERIOR CERVICAL DISCECTOMY FUSION C3-4, C4-5, ALLOGRAFT, PLATE;  Surgeon: Marybelle Killings, MD;  Location: [facility];  Service: Orthopedics;  Laterality: N/A;  needs [place] and 2003(dr kritzer)   CARDIAC CATHETERIZATION     SHOULDER SURGERY Right    2017   TONSILLECTOMY     removed as a child   Patient Active Problem List   Diagnosis Date Noted   Depressive reaction    Leukocytosis    Neuropathic pain    Spondylolisthesis, lumbar region 12/04/2021    Class: Chronic   Degenerative disc disease, lumbar 12/04/2021   Lumbar stenosis 12/04/2021   Status post lumbar spinal fusion 12/04/2021   Spinal stenosis of lumbar region 05/30/2021   S/P cervical spinal fusion 12/18/2020   Chronic radicular cervical pain 05/01/2020   Foraminal stenosis of cervical region (right) 05/01/2020   Chronic pain syndrome 05/01/2020   History of lumbar fusion 05/01/2020   HTN (hypertension)    Diabetes mellitus without  complication ([facility])    Chronic back pain    Coronary artery disease    CAD (coronary artery disease) 03/22/2019   Hypertension 02/23/2019   Hyperlipidemia 02/23/2019   Chest pain 02/22/2019    THERAPY DIAG:  Decreased strength  Other low back pain  Neuropathic pain  Status post lumbar spinal fusion   Rationale for Evaluation and Treatment Rehabilitation  REFERRING DIAG: M51.36 (ICD-10-CM) - Degenerative disc disease, lumbar M43.16 (ICD-10-CM) - Spondylolisthesis, lumbar region    PERTINENT HISTORY: Assessment & Plan:2.5 months post op L3-4 and L4-5 left TLIF for spinal stenosis with congenital foramenal narrowing. Has bilateral hypesthesias medial thighs and calfs.    Chief Complaint:       Chief Complaint  Patient presents with   Lower Back - Follow-up  Bilateral leg numbness and tingling.  SLR is negative  Weak right more than left Quadriceps. Remainder intact. CT with right more than left foramenal narrowing L3 and L4  PRECAUTIONS/RESTRICTIONS:    Avoid frequent bending and stooping  No lifting greater than 10 lbs. May use ice or moist heat for pain. Weight loss is of benefit. Will start a pool exercise rehabilitation program. Presently with right quadriceps weakness. Obtain MRI to assess for residual foramenal narrowing  L3 and L4.  Exercise is important to improve your indurance and does allow people to function better inspite of back pain  SUBJECTIVE:  Pt reports he is a 6/10 today   Pt states he is doing a little better  but he is still very sensitive to touch in his back and legs.    PAIN: 03-27-22  6/10 Are you having pain? Yes: NPRS scale: 8/10 Pain location: low back and BLEs Pain description: ache and paresthesias Aggravating factors: activity Relieving factors: marijuana  OBJECTIVE: (objective measures completed at initial evaluation unless otherwise dated)  DIAGNOSTIC FINDINGS:  MRI 03/02/22 results pending   PATIENT SURVEYS:  FOTO TBD    SCREENING FOR RED FLAGS: negative                    SENSATION: Not tested   MUSCLE LENGTH: Hamstrings: Right 70 deg; Left 50 deg T   POSTURE:  not tested    PALPATION: Not tested   LUMBAR ROM: deferred per post op restrictions   AROM eval  Flexion    Extension    Right lateral flexion    Left lateral flexion    Right rotation    Left rotation     (Blank rows = not tested)   LOWER EXTREMITY ROM:      Active  Right eval Left eval  Hip flexion      Hip extension      Hip abduction      Hip adduction      Hip internal rotation      Hip external rotation      Knee flexion [facility] WFL  Knee extension [facility]  Ankle dorsiflexion [facility] WFL  Ankle plantarflexion [facility] WFL  Ankle inversion      Ankle eversion       (Blank rows = not tested)   LOWER EXTREMITY MMT:     MMT Right eval Left eval  Hip flexion 3+ 3+  Hip extension 3+ 3+  Hip abduction 3+ 3+  Hip adduction      Hip internal rotation      Hip external rotation      Knee flexion 3+ 3+  Knee extension 3+ 3+  Ankle dorsiflexion      Ankle plantarflexion 3+ 3+  Ankle inversion      Ankle eversion      Core/trunk  3 3   (Blank rows = not tested)   LUMBAR SPECIAL TESTS:  Straight leg raise test: Negative and Slump test: Negative   FUNCTIONAL TESTS:  30 seconds chair stand test 1 stand arms crossed    GAIT: Distance walked: 50fx2 Assistive device utilized: None Level of assistance: Complete Independence Comments: antalgic gait with lumbar support  [facility] Adult PT Treatment:                                                DATE: 03-27-22 Aquatic therapy at [address] - therapeutic pool temp 92 degrees Pt enters building ambulating with decorative walking stick  Treatment took place in water 3.8 to  4 ft 8 in.feet deep depending upon activity.  Pt entered and exited the pool via stair and handrails independently  Pt pain level 6/10 at initiation of water walking. Pt 5/10 at end of  session Pt is observed sitting on pool bench outside of  pool with better hip hinge and body mechanics  Therapeutic Exercise: Walking forward/backwards/sidestepping Stretching: Runners Stretch x 30" x 2 BIL Hamstring stretch x 30" x 2 BIL Gentle trunk rotation to pt tolerance using pool noodle Seated on submerged bench: Bicycle kicks x1' Reverse bicycle x 1' Flutter kicks x1' Scissor kicks x1' At edge of pool, pt performed LE exercise: Standing march x20 BIL Heel raises 2x20 Hip Hinge 1 x 10 Hip flexion -> extension 2x10 BIL Hip abd/add x20 BIL Squats x20  Bad Ragaz, Pt with lumbar belt around hips and yellow neck flotation for neck support. Pt also using aqua jogger around waist for added flotation  floating supine. Pt assisted into supine floating position by lying head on shoulder of PT to get into floating position. PT at torso and assisting with trunk left to right and vice versa to engage trunk muscles. PT then rotated trunk in order to engage abdomnal (internal and external obliques)  Emphasis on breathing techniques (Box breathing) to draw in abdominals for support.  Pt then utilizing posterior chain and engaging Hip extension and knee flexion with water resistance.  Gentle LAD on RT and LT  Pt also tolerated Aqua stretch on LT QL and LT hip with decrease in pain to 5/10   Pt requires the buoyancy of water for active assisted exercises with buoyancy supported for strengthening and AROM exercises. Hydrostatic pressure also supports joints by unweighting joint load by at least 50 % in 3-4 feet depth water. 80% in chest to neck deep water. Water will provide assistance with movement using the current and laminar flow while the buoyancy reduces weight bearing. Pt requires the viscosity of the water for resistance with strengthening exercises.   [facility] Adult PT Treatment:                                                DATE: 03-20-22 Aquatic therapy at [address] -  therapeutic pool temp 92 degrees Pt enters building ambulating with walking stick  Treatment took place in water 3.8 to  4 ft 8 in.feet deep depending upon activity.  Pt entered and exited the pool via stair and handrails independently  Pt pain level 8-9/10 at initiation of water walking. Pt 6/10 at end of session  Mr Molyneux was educated on  beneficial therapeutic effects of water while ambulating to acclimate to water walking forward, backward and side stepping. Pt educated on neutral posture and hip hinging in seated position with water at chest level x 10 with stretch to low back and then x 10 with back at pool wall at external cue, VC for neck tucked to prevent hyperextension. Pt needed extra time  and TC to get into proper position  Therapeutic Exercise: Walking forward/backwards/sidestepping Sidestepping with rainbow DB shoulder abd/adduction x2 laps Stretching: Runners Stretch x 30" x 2 BIL Hamstring stretch x 30" x 2 BIL Gentle trunk rotation to pt tolerance using pool noodle At edge of pool, pt performed LE exercise: Standing march x20 BIL Heel raises 2x20 Hip flexion -> extension 2x10 BIL Hip abd/add x20 BIL Squats x20 Seated on submerged bench: Bicycle kicks x1' Reverse bicycle x 1' Flutter kicks x1' Scissor kicks x1'  Ai Chi  introduction for pt with demo and encouraged  deep breathing and pain control using slow controlled Tai chi like movements with shoulders submerged.  Emphasis on shoulder movement / gentle trunk motion and abdominal engagement Ai Chi posture of contemplating to floating then uplifting to enclosing and ending with folding,   All 8-10 x each - cues given to keep core tight for improved trunk stabilization  Pt requires the buoyancy of water for active assisted exercises with buoyancy supported for strengthening and AROM exercises. Hydrostatic pressure also supports joints by unweighting joint load by at least 50 % in 3-4 feet depth water. 80% in chest to neck  deep water. Water will provide assistance with movement using the current and laminar flow while the buoyancy reduces weight bearing. Pt requires the viscosity of the water for resistance with strengthening exercises.   [facility] Adult PT Treatment:                                                DATE: 03/15/2022 Aquatic therapy at [address] - therapeutic pool temp 92 degrees Pt enters building ambulating with walking stick  Treatment took place in water 3.8 to  4 ft 8 in.feet deep depending upon activity.  Pt entered and exited the pool via stair and handrails independently  Pt pain level 8/10 at initiation of water walking.  Therapeutic Exercise: Walking forward/backwards/sidestepping Sidestepping with rainbow DB shoulder abd/adduction x2 laps Stretching: Hip flexor stretch on bottom step x30" BIL Hamstring stretch on bottom step x30" BIL At edge of pool, pt performed LE exercise: Standing march x20 BIL Heel raises 2x20 Hamstring curl x20 BIL Hip flexion -> extension 2x10 BIL Hip abd/add x20 BIL Squats x20 Seated on submerged bench: Bicycle kicks x1' Flutter kicks x1' Scissor kicks x1'  Pt requires the buoyancy of water for active assisted exercises with buoyancy supported for strengthening and AROM exercises. Hydrostatic pressure also supports joints by unweighting joint load by at least 50 % in 3-4 feet depth water. 80% in chest to neck deep water. Water will provide assistance with movement using the current and laminar flow while the buoyancy reduces weight bearing. Pt requires the viscosity of the water for resistance with strengthening exercises.  TREATMENT 03/14/22:  Aquatic therapy at [address] - therapeutic pool temp 92 degrees Pt enters building independently.  Treatment took place in water 3.8 to  4 ft 8 in.feet deep depending upon activity.  Pt entered and exited the pool via stair and handrails    Aquatic Therapy:  Water walking for  warm up  Stretching: Hip flexor on bottom step x30" BIL Hamstring stretch on bottom step x30" BIL Figure 4 squat stretch, BIL UE support 2x30" BIL (NT)  At edge of pool, pt performed LE exercise: Standing march x20 Heel raises 2x20 Hamstring curl x20 BIL Hip flexion -> extension 2x10 BIL Hip abd/add x20 BIL Squats x20 Mini lunge lateral x20 Rainbow DB push down - abd, flexion, alt abd, alt flexion - 20x ea  Pt requires the buoyancy of water for active assisted exercises with buoyancy supported for strengthening and AROM exercises. Hydrostatic pressure also supports joints by unweighting joint load by at least 50 % in 3-4 feet depth water. 80% in chest to neck deep water. Water will provide assistance with movement using the current and laminar flow while the buoyancy reduces weight bearing. Pt requires the viscosity of the water for resistance with strengthening exercises.   ASSESSMENT:   CLINICAL IMPRESSION:  Mr Perine presents to aquatic PT with 9/10 pain in low back and sensitivity to light touch over surgical site.  Discussed desensitization techniques and importance of movement for healing. PT spent extra time with education of hip hinge and pt was able to perform correctly at end of time with decreased pulling and stretching pain in low back. Session today focused on proximal hip and core strengthening. Mr.Hemp with increased hypersensitivity of his BIL LE with light touch.  Hydrostatic pressure also supports joints by unweighting joint load by at least 50 % in 3-4 feet depth water. 80% in chest to neck deep water. Water will provide assistance with movement using the current and laminar flow while the buoyancy reduces weight bearing. Pt requires the viscosity of the water for resistance with strengthening exercises.  Patient continues to benefit from skilled PT services on land and aquatic based and should be progressed as able to improve functional independence.    OBJECTIVE  IMPAIRMENTS: Abnormal gait, decreased activity tolerance, decreased balance, decreased endurance, decreased knowledge of condition, decreased mobility, difficulty walking, decreased ROM, decreased strength, increased muscle spasms, impaired flexibility, improper body mechanics, postural dysfunction, and pain.    ACTIVITY LIMITATIONS: carrying, lifting, bending, sitting, standing, squatting, stairs, and bed mobility   PERSONAL FACTORS: Fitness, Past/current experiences, Time since onset of injury/illness/exacerbation, and 1 comorbidity: previous lumbar surgeries   are also affecting patient's functional outcome.    REHAB POTENTIAL: Fair based on multiple surgeries  and chronicity of symptoms   CLINICAL DECISION MAKING: Evolving/moderate complexity   EVALUATION COMPLEXITY: Low     GOALS: Goals reviewed with patient? Yes   SHORT TERM GOALS: Target date: 03/19/2022   Patient to demonstrate independence in HEP  Baseline:[id number] Goal status: INITIAL   2.  Initiate aquatics program Baseline: TBD Goal status: MET Initiated 03/14/2022     LONG TERM GOALS: Target date: 04/02/2022   70d L SLR Baseline: 50d SLR Goal status: INITIAL   2.  Increase 30s stand test to 5 reps arms crossed Baseline: 1 stand arms crossed Goal status: INITIAL   3.  Increase BLE strength to 4-/5 Baseline:  MMT Right eval Left eval  Hip flexion 3+ 3+  Hip extension 3+ 3+  Hip abduction 3+ 3+  Hip adduction      Hip internal rotation      Hip external rotation      Knee flexion 3+ 3+  Knee extension 3+ 3+  Ankle dorsiflexion      Ankle plantarflexion 3+ 3+    Goal status: INITIAL     PLAN:   PT FREQUENCY: 2x/week   PT DURATION: 4 weeks   PLANNED INTERVENTIONS: Therapeutic exercises, Therapeutic activity, Neuromuscular re-education, Balance training, Gait training, Patient/Family education, Self Care, Joint mobilization, Stair training, DME instructions, Aquatic Therapy, Dry Needling, and  Re-evaluation.   PLAN FOR NEXT SESSION: review and update HEP, initiate aquatic PT, LE strengthening    Voncille Lo, PT, [employer] Certified Exercise Expert for the Aging Adult  03/27/22 4:13 PM Phone: [phone] Fax: [phone]

## 2022-03-28 ENCOUNTER — Other Ambulatory Visit: Payer: Self-pay

## 2022-03-28 ENCOUNTER — Ambulatory Visit: Payer: Medicare Other | Admitting: Physical Therapy

## 2022-03-28 DIAGNOSIS — R531 Weakness: Secondary | ICD-10-CM

## 2022-03-28 DIAGNOSIS — M5459 Other low back pain: Secondary | ICD-10-CM

## 2022-03-28 DIAGNOSIS — R6889 Other general symptoms and signs: Secondary | ICD-10-CM | POA: Diagnosis not present

## 2022-03-28 DIAGNOSIS — M792 Neuralgia and neuritis, unspecified: Secondary | ICD-10-CM

## 2022-03-28 NOTE — Therapy (Signed)
OUTPATIENT PHYSICAL THERAPY TREATMENT NOTE   Patient Name: Scott Tucker MRN: [id number] DOB:[birth year], 56 y.o., male Today's Date: 03/29/2022  PCP: Kerin Perna, NP   REFERRING PROVIDER: Jessy Oto, MD   PT End of Session - 03/28/22 1445     Visit Number 6    Number of Visits 9    Date for PT Re-Evaluation 05/05/22    Authorization Type BCBS    PT Start Time 1445    PT Stop Time 1527    PT Time Calculation (min) 42 min    Activity Tolerance Patient limited by pain    Behavior During Therapy WFL for tasks assessed/performed              Past Medical History:  Diagnosis Date   Anginal pain ([place])    Anxiety    Arthritis    Chronic back pain    Coronary artery disease    Diabetes mellitus without complication ([place])    type 2   Dyspnea    Headache    HTN (hypertension)    Neuromuscular disorder ([place])    Past Surgical History:  Procedure Laterality Date   ANTERIOR CERVICAL DECOMP/DISCECTOMY FUSION N/A 12/04/2020   Procedure: ANTERIOR CERVICAL DISCECTOMY FUSION C3-4, C4-5, ALLOGRAFT, PLATE;  Surgeon: Marybelle Killings, MD;  Location: [place];  Service: Orthopedics;  Laterality: N/A;  needs [place] and 2003(dr kritzer)   CARDIAC CATHETERIZATION     SHOULDER SURGERY Right    2017   TONSILLECTOMY     removed as a child   Patient Active Problem List   Diagnosis Date Noted   Depressive reaction    Leukocytosis    Neuropathic pain    Spondylolisthesis, lumbar region 12/04/2021    Class: Chronic   Degenerative disc disease, lumbar 12/04/2021   Lumbar stenosis 12/04/2021   Status post lumbar spinal fusion 12/04/2021   Spinal stenosis of lumbar region 05/30/2021   S/P cervical spinal fusion 12/18/2020   Chronic radicular cervical pain 05/01/2020   Foraminal stenosis of cervical region (right) 05/01/2020   Chronic pain syndrome 05/01/2020   History of lumbar fusion 05/01/2020   HTN (hypertension)    Diabetes mellitus without  complication ([facility])    Chronic back pain    Coronary artery disease    CAD (coronary artery disease) 03/22/2019   Hypertension 02/23/2019   Hyperlipidemia 02/23/2019   Chest pain 02/22/2019    THERAPY DIAG:  Decreased strength  Other low back pain  Neuropathic pain   Rationale for Evaluation and Treatment Rehabilitation  REFERRING DIAG: M51.36 (ICD-10-CM) - Degenerative disc disease, lumbar M43.16 (ICD-10-CM) - Spondylolisthesis, lumbar region    PERTINENT HISTORY: Assessment & Plan:2.5 months post op L3-4 and L4-5 left TLIF for spinal stenosis with congenital foramenal narrowing. Has bilateral hypesthesias medial thighs and calfs.    Chief Complaint:       Chief Complaint  Patient presents with   Lower Back - Follow-up  Bilateral leg numbness and tingling.  SLR is negative  Weak right more than left Quadriceps. Remainder intact. CT with right more than left foramenal narrowing L3 and L4  PRECAUTIONS/RESTRICTIONS:    Avoid frequent bending and stooping  No lifting greater than 10 lbs. May use ice or moist heat for pain. Weight loss is of benefit. Will start a pool exercise rehabilitation program. Presently with right quadriceps weakness. Obtain MRI to assess for residual foramenal narrowing L3 and L4.  Exercise is  important to improve your indurance and does allow people to function better inspite of back pain  SUBJECTIVE:  Donovan reports that he feels aquatic therapy has been helpful and that his pain level is somewhat decreased  PAIN:  Are you having pain? Yes: NPRS scale: 6/10 Pain location: low back and BLEs Pain description: ache and paresthesias Aggravating factors: activity Relieving factors: marijuana  OBJECTIVE: (objective measures completed at initial evaluation unless otherwise dated)  DIAGNOSTIC FINDINGS:  MRI 03/02/22 results pending   PATIENT SURVEYS:  FOTO TBD   SCREENING FOR RED FLAGS: negative                    SENSATION: Not tested    MUSCLE LENGTH: Hamstrings: Right 70 deg; Left 50 deg T   POSTURE:  not tested    PALPATION: Not tested   LUMBAR ROM: deferred per post op restrictions   AROM eval  Flexion    Extension    Right lateral flexion    Left lateral flexion    Right rotation    Left rotation     (Blank rows = not tested)   LOWER EXTREMITY ROM:      Active  Right eval Left eval  Hip flexion      Hip extension      Hip abduction      Hip adduction      Hip internal rotation      Hip external rotation      Knee flexion [facility] WFL  Knee extension [facility]  Ankle dorsiflexion [facility] WFL  Ankle plantarflexion [facility] WFL  Ankle inversion      Ankle eversion       (Blank rows = not tested)   LOWER EXTREMITY MMT:     MMT Right eval Left eval  Hip flexion 3+ 3+  Hip extension 3+ 3+  Hip abduction 3+ 3+  Hip adduction      Hip internal rotation      Hip external rotation      Knee flexion 3+ 3+  Knee extension 3+ 3+  Ankle dorsiflexion      Ankle plantarflexion 3+ 3+  Ankle inversion      Ankle eversion      Core/trunk  3 3   (Blank rows = not tested)   LUMBAR SPECIAL TESTS:  Straight leg raise test: Negative and Slump test: Negative   FUNCTIONAL TESTS:  30 seconds chair stand test 1 stand arms crossed    GAIT: Distance walked: 12fx2 Assistive device utilized: None Level of assistance: Complete Independence Comments: antalgic gait with lumbar support   [facility] Adult PT Treatment:                                                DATE: 03/28/2022 [address] - therapeutic pool temp 92 degrees Pt enters building ambulating with walking stick  Treatment took place in water 3.8 to  4 ft 8 in.feet deep depending upon activity.  Pt entered and exited the pool via stair and handrails independently   Therapeutic Exercise: Walking forward/backwards/sidestepping Sidestepping with Yellow  DB shoulder abd/adduction x2 laps Stretching: Hip flexor stretch on  bottom step x30" BIL Hamstring stretch on bottom step x30" BIL At edge of pool: Walking march 3 laps Heel raises 2x20 Hip hinge EOP 2x20 Hip flexion ->  extension 2x10 BIL Weighted ball gentle rotation with shoulders flexed - 20x Hip flexion -> IR/ER x10 ea Kick board push/pull - 20x Hip abd/add x20 BIL Squats x20 Riding pool noodle across pool using 2 yellow DB - 2 laps Seated on submerged bench: Bicycle kicks x1' Flutter kicks x1' Scissor kicks x1'  Pt requires the buoyancy of water for active assisted exercises with buoyancy supported for strengthening and AROM exercises. Hydrostatic pressure also supports joints by unweighting joint load by at least 50 % in 3-4 feet depth water. 80% in chest to neck deep water. Water will provide assistance with movement using the current and laminar flow while the buoyancy reduces weight bearing. Pt requires the viscosity of the water for resistance with strengthening exercises.  TREATMENT 03/14/22:  Aquatic therapy at [address] - therapeutic pool temp 92 degrees Pt enters building independently.  Treatment took place in water 3.8 to  4 ft 8 in.feet deep depending upon activity.  Pt entered and exited the pool via stair and handrails    Aquatic Therapy:  Water walking for warm up  Stretching: Hip flexor on bottom step x30" BIL Hamstring stretch on bottom step x30" BIL Figure 4 squat stretch, BIL UE support 2x30" BIL (NT)  At edge of pool, pt performed LE exercise: Standing march x20 Heel raises 2x20 Hamstring curl x20 BIL Hip flexion -> extension 2x10 BIL Hip abd/add x20 BIL Squats x20 Mini lunge lateral x20 Rainbow DB push down - abd, flexion, alt abd, alt flexion - 20x ea  Pt requires the buoyancy of water for active assisted exercises with buoyancy supported for strengthening and AROM exercises. Hydrostatic pressure also supports joints by unweighting joint load by at least 50 % in 3-4 feet depth water. 80% in  chest to neck deep water. Water will provide assistance with movement using the current and laminar flow while the buoyancy reduces weight bearing. Pt requires the viscosity of the water for resistance with strengthening exercises.   ASSESSMENT:   CLINICAL IMPRESSION:  Session today focused on increasing overall activity tolerance and comfort with hip and lumbar movement in the aquatic environment for use of buoyancy to offload joints and the viscosity of water as resistance during therapeutic exercise.  Rj shows overall improved activity tolerance and improved ability to hip hinge with only minimal cuing.  Patient was able to tolerate all prescribed exercises in the aquatic environment with no adverse effects and reports 7/10 pain at the end of the session. Patient continues to benefit from skilled PT services on land and aquatic based and should be progressed as able to improve functional independence.      OBJECTIVE IMPAIRMENTS: Abnormal gait, decreased activity tolerance, decreased balance, decreased endurance, decreased knowledge of condition, decreased mobility, difficulty walking, decreased ROM, decreased strength, increased muscle spasms, impaired flexibility, improper body mechanics, postural dysfunction, and pain.    ACTIVITY LIMITATIONS: carrying, lifting, bending, sitting, standing, squatting, stairs, and bed mobility   PERSONAL FACTORS: Fitness, Past/current experiences, Time since onset of injury/illness/exacerbation, and 1 comorbidity: previous lumbar surgeries   are also affecting patient's functional outcome.    REHAB POTENTIAL: Fair based on multiple surgeries  and chronicity of symptoms   CLINICAL DECISION MAKING: Evolving/moderate complexity   EVALUATION COMPLEXITY: Low     GOALS: Goals reviewed with patient? Yes   SHORT TERM GOALS: Target date: 03/19/2022   Patient to demonstrate independence in HEP  Baseline:[id number] Goal status: ongling   2.  Initiate  aquatics program Baseline: TBD  Goal status: MET Initiated 03/14/2022     LONG TERM GOALS: Target date: 04/02/2022   70d L SLR Baseline: 50d SLR Goal status: INITIAL   2.  Increase 30s stand test to 5 reps arms crossed Baseline: 1 stand arms crossed Goal status: INITIAL   3.  Increase BLE strength to 4-/5 Baseline:  MMT Right eval Left eval  Hip flexion 3+ 3+  Hip extension 3+ 3+  Hip abduction 3+ 3+  Hip adduction      Hip internal rotation      Hip external rotation      Knee flexion 3+ 3+  Knee extension 3+ 3+  Ankle dorsiflexion      Ankle plantarflexion 3+ 3+    Goal status: INITIAL     PLAN:   PT FREQUENCY: 2x/week   PT DURATION: 4 weeks   PLANNED INTERVENTIONS: Therapeutic exercises, Therapeutic activity, Neuromuscular re-education, Balance training, Gait training, Patient/Family education, Self Care, Joint mobilization, Stair training, DME instructions, Aquatic Therapy, Dry Needling, and Re-evaluation.   PLAN FOR NEXT SESSION: review and update HEP, initiate aquatic PT, LE strengthening    Mathis Dad PT 03/29/22 9:53 AM Phone: [phone] Fax: [phone]

## 2022-04-01 ENCOUNTER — Other Ambulatory Visit: Payer: Self-pay

## 2022-04-03 ENCOUNTER — Other Ambulatory Visit: Payer: Self-pay

## 2022-04-03 ENCOUNTER — Ambulatory Visit: Payer: Medicare Other | Admitting: Physical Therapy

## 2022-04-08 ENCOUNTER — Ambulatory Visit: Payer: Medicare Other

## 2022-04-08 DIAGNOSIS — M5459 Other low back pain: Secondary | ICD-10-CM

## 2022-04-08 DIAGNOSIS — M792 Neuralgia and neuritis, unspecified: Secondary | ICD-10-CM

## 2022-04-08 DIAGNOSIS — Z981 Arthrodesis status: Secondary | ICD-10-CM

## 2022-04-08 DIAGNOSIS — R531 Weakness: Secondary | ICD-10-CM | POA: Diagnosis not present

## 2022-04-08 NOTE — Therapy (Signed)
OUTPATIENT PHYSICAL THERAPY TREATMENT NOTE   Patient Name: Scott Tucker MRN: [id number] DOB:1965/06/13, 56 y.o., male Today's Date: 04/08/2022  PCP: Kerin Perna, NP   REFERRING PROVIDER: Jessy Oto, MD   PT End of Session - 04/08/22 1000     Visit Number 7    Number of Visits 9    Date for PT Re-Evaluation 05/05/22    Authorization Type BCBS    PT Start Time 1000    PT Stop Time 1040    PT Time Calculation (min) 40 min    Activity Tolerance Patient limited by pain    Behavior During Therapy WFL for tasks assessed/performed              Past Medical History:  Diagnosis Date   Anginal pain ([place])    Anxiety    Arthritis    Chronic back pain    Coronary artery disease    Diabetes mellitus without complication ([place])    type 2   Dyspnea    Headache    HTN (hypertension)    Neuromuscular disorder ([place])    Past Surgical History:  Procedure Laterality Date   ANTERIOR CERVICAL DECOMP/DISCECTOMY FUSION N/A 12/04/2020   Procedure: ANTERIOR CERVICAL DISCECTOMY FUSION C3-4, C4-5, ALLOGRAFT, PLATE;  Surgeon: Marybelle Killings, MD;  Location: [place];  Service: Orthopedics;  Laterality: N/A;  needs [place] and 2003(dr kritzer)   CARDIAC CATHETERIZATION     SHOULDER SURGERY Right    2017   TONSILLECTOMY     removed as a child   Patient Active Problem List   Diagnosis Date Noted   Depressive reaction    Leukocytosis    Neuropathic pain    Spondylolisthesis, lumbar region 12/04/2021    Class: Chronic   Degenerative disc disease, lumbar 12/04/2021   Lumbar stenosis 12/04/2021   Status post lumbar spinal fusion 12/04/2021   Spinal stenosis of lumbar region 05/30/2021   S/P cervical spinal fusion 12/18/2020   Chronic radicular cervical pain 05/01/2020   Foraminal stenosis of cervical region (right) 05/01/2020   Chronic pain syndrome 05/01/2020   History of lumbar fusion 05/01/2020   HTN (hypertension)    Diabetes mellitus without  complication ([facility])    Chronic back pain    Coronary artery disease    CAD (coronary artery disease) 03/22/2019   Hypertension 02/23/2019   Hyperlipidemia 02/23/2019   Chest pain 02/22/2019    THERAPY DIAG:  Decreased strength  Other low back pain  Neuropathic pain  Status post lumbar spinal fusion   Rationale for Evaluation and Treatment Rehabilitation  REFERRING DIAG: M51.36 (ICD-10-CM) - Degenerative disc disease, lumbar M43.16 (ICD-10-CM) - Spondylolisthesis, lumbar region    PERTINENT HISTORY: Assessment & Plan:2.5 months post op L3-4 and L4-5 left TLIF for spinal stenosis with congenital foramenal narrowing. Has bilateral hypesthesias medial thighs and calfs.    Chief Complaint:       Chief Complaint  Patient presents with   Lower Back - Follow-up  Bilateral leg numbness and tingling.  SLR is negative  Weak right more than left Quadriceps. Remainder intact. CT with right more than left foramenal narrowing L3 and L4  PRECAUTIONS/RESTRICTIONS:    Avoid frequent bending and stooping  No lifting greater than 10 lbs. May use ice or moist heat for pain. Weight loss is of benefit. Will start a pool exercise rehabilitation program. Presently with right quadriceps weakness. Obtain MRI to assess for residual foramenal narrowing  L3 and L4.  Exercise is important to improve your indurance and does allow people to function better inspite of back pain  SUBJECTIVE:  Aquatic PT has been helpful.  Pain rated at 8/10 in RLE as well as tenderness to touch.  Reports pain in low back and L hip rated at 8/10.    PAIN:  Are you having pain? Yes: NPRS scale: 6/10 Pain location: low back and BLEs Pain description: ache and paresthesias Aggravating factors: activity Relieving factors: marijuana  OBJECTIVE: (objective measures completed at initial evaluation unless otherwise dated)  DIAGNOSTIC FINDINGS:  MRI 03/02/22 results pending 04/08/22 IMPRESSION: 1. Previous discectomy  and fusion from L3 through S1. Previous hardware removal at S1. Sufficient patency of the canal and foramina at those levels, allowing for artifact from fusion hardware. 2. L2-3: Mild bulging of the disc in both posterolateral to foraminal regions. Mild facet and ligamentous hypertrophy. Mild bilateral foraminal narrowing without visible neural compression. Minimal worsening of these findings since February of 2022. 3. T11-12: Disc bulge more prominent towards the left. Facet and ligamentous hypertrophy. Bilateral foraminal narrowing as seen previously.     Electronically Signed   By: Nelson Chimes M.D.   On: 03/05/2022 09:10   PATIENT SURVEYS:  FOTO TBD   SCREENING FOR RED FLAGS: negative                    SENSATION: Not tested   MUSCLE LENGTH: Hamstrings: Right 70 deg; Left 50 deg T   POSTURE:  not tested    PALPATION: Not tested   LUMBAR ROM: deferred per post op restrictions   AROM eval  Flexion    Extension    Right lateral flexion    Left lateral flexion    Right rotation    Left rotation     (Blank rows = not tested)   LOWER EXTREMITY ROM:      Active  Right eval Left eval R 04/08/22 L  04/08/22  Hip flexion     95d 110d  Hip extension        Hip abduction        Hip adduction        Hip internal rotation        Hip external rotation        Knee flexion [facility]    Knee extension [facility]    Ankle dorsiflexion [facility]    Ankle plantarflexion [facility] WFL    Ankle inversion        Ankle eversion         (Blank rows = not tested)   LOWER EXTREMITY MMT:     MMT Right eval Left eval R  04/08/22 L  04/08/22  Hip flexion 3+ 3+ 3+ 4-  Hip extension 3+ 3+ 3+ 3+  Hip abduction 3+ 3+ 3+ 3+  Hip adduction        Hip internal rotation        Hip external rotation        Knee flexion 3+ 3+ 4- 4-  Knee extension 3+ 3+ 4- 4-  Ankle dorsiflexion        Ankle plantarflexion 3+ 3+ 4- 4-  Ankle inversion        Ankle eversion        Core/trunk  _0 (Blank rows = not tested)   LUMBAR SPECIAL TESTS:  Straight leg raise test: Negative and Slump test: Negative 04/08/22 positive B  for low back pain   FUNCTIONAL TESTS:  30 seconds chair stand test 1 stand arms crossed  04/08/22 30s chair stand test 4 stands with arms crossed   GAIT: Distance walked: 31fx2 Assistive device utilized: None Level of assistance: Complete Independence Comments: antalgic gait with lumbar support  HEP: Access Code: [email or web address]: [email or web address] Date: 04/08/2022 Prepared by: JSharlynn Oliphant Exercises - Supine Posterior Pelvic Tilt  - 5 x daily - 5 x weekly - 1 sets - 5 reps - 3s hold - Supine March  - 5 x daily - 5 x weekly - 1 sets - 5 reps - Standing Heel Raise with Support  - 5 x daily - 5 x weekly - 1 sets - 5 reps - Seated Long Arc Quad  - 5 x daily - 5 x weekly - 1 sets - 5 reps  [facility] Adult PT Treatment:                                                DATE: 04/08/22 Therapeutic Exercise: PPT 3s x15 [id number] B FAQs 10/10 Heel raises 10x Toe raises 10x Standing marches 10/10  Re-assessment of progress towards goals  [facility]                                                DATE: 03/28/2022 [address] therapeutic pool temp 92 degrees Pt enters building ambulating with walking stick  Treatment took place in water 3.8 to  4 ft 8 in.feet deep depending upon activity.  Pt entered and exited the pool via stair and handrails independently   Therapeutic Exercise: Walking forward/backwards/sidestepping Sidestepping with Yellow  DB shoulder abd/adduction x2 laps Stretching: Hip flexor stretch on bottom step x30" BIL Hamstring stretch on bottom step x30" BIL At edge of pool: Walking march 3 laps Heel raises 2x20 Hip hinge EOP 2x20 Hip flexion -> extension 2x10 BIL Weighted ball gentle rotation with shoulders flexed - 20x Hip flexion -> IR/ER x10 ea Kick board  push/pull - 20x Hip abd/add x20 BIL Squats x20 Riding pool noodle across pool using 2 yellow DB - 2 laps Seated on submerged bench: Bicycle kicks x1' Flutter kicks x1' Scissor kicks x1'  Pt requires the buoyancy of water for active assisted exercises with buoyancy supported for strengthening and AROM exercises. Hydrostatic pressure also supports joints by unweighting joint load by at least 50 % in 3-4 feet depth water. 80% in chest to neck deep water. Water will provide assistance with movement using the current and laminar flow while the buoyancy reduces weight bearing. Pt requires the viscosity of the water for resistance with strengthening exercises.  TREATMENT 03/14/22:  [address] therapeutic pool temp 92 degrees Pt enters building independently.  Treatment took place in water 3.8 to  4 ft 8 in.feet deep depending upon activity.  Pt entered and exited the pool via stair and handrails    Aquatic Therapy:  Water walking for warm up  Stretching: Hip flexor on bottom step x30" BIL Hamstring stretch on bottom step x30" BIL Figure 4 squat stretch, BIL UE support 2x30" BIL (NT)  At edge of pool, pt performed  LE exercise: Standing march x20 Heel raises 2x20 Hamstring curl x20 BIL Hip flexion -> extension 2x10 BIL Hip abd/add x20 BIL Squats x20 Mini lunge lateral x20 Rainbow DB push down - abd, flexion, alt abd, alt flexion - 20x ea  Pt requires the buoyancy of water for active assisted exercises with buoyancy supported for strengthening and AROM exercises. Hydrostatic pressure also supports joints by unweighting joint load by at least 50 % in 3-4 feet depth water. 80% in chest to neck deep water. Water will provide assistance with movement using the current and laminar flow while the buoyancy reduces weight bearing. Pt requires the viscosity of the water for resistance with strengthening exercises.   ASSESSMENT:   CLINICAL IMPRESSION:  Today's session assessed progress towards goals and updated HEP.  FOTO survey taken.  STGs have been met, HEP updated.  Gait demos increased upright posture and increased cadence.  Recommend continue OPPT 4 additional weeks and transition out of aquatics.        OBJECTIVE IMPAIRMENTS: Abnormal gait, decreased activity tolerance, decreased balance, decreased endurance, decreased knowledge of condition, decreased mobility, difficulty walking, decreased ROM, decreased strength, increased muscle spasms, impaired flexibility, improper body mechanics, postural dysfunction, and pain.    ACTIVITY LIMITATIONS: carrying, lifting, bending, sitting, standing, squatting, stairs, and bed mobility   PERSONAL FACTORS: Fitness, Past/current experiences, Time since onset of injury/illness/exacerbation, and 1 comorbidity: previous lumbar surgeries   are also affecting patient's functional outcome.    REHAB POTENTIAL: Fair based on multiple surgeries  and chronicity of symptoms   CLINICAL DECISION MAKING: Evolving/moderate complexity   EVALUATION COMPLEXITY: Low     GOALS: Goals reviewed with patient? Yes   SHORT TERM GOALS: Target date: 03/19/2022   Patient to demonstrate independence in HEP  Baseline:[id number] Goal status: met   2.  Initiate aquatics program Baseline: TBD Goal status: MET Initiated 03/14/2022     LONG TERM GOALS: Target date: 04/02/2022   70d L SLR Baseline: 50d SLR; 04/08/22 R 45d, L 60d Goal status: Ongoing   2.  Increase 30s stand test to 5 reps arms crossed Baseline: 1 stand arms crossed; 04/08/22 5 stands Goal status: Met   3.  Increase BLE strength to 4-/5 Baseline:  MMT Right eval Left eval  Hip flexion 3+ 3+  Hip extension 3+ 3+  Hip abduction 3+ 3+  Hip adduction      Hip internal rotation      Hip external rotation      Knee flexion 3+ 3+  Knee extension 3+ 3+  Ankle dorsiflexion      Ankle plantarflexion 3+ 3+    Goal status: INITIAL     PLAN:    PT FREQUENCY: 2x/week   PT DURATION: 4 weeks   PLANNED INTERVENTIONS: Therapeutic exercises, Therapeutic activity, Neuromuscular re-education, Balance training, Gait training, Patient/Family education, Self Care, Joint mobilization, Stair training, DME instructions, Aquatic Therapy, Dry Needling, and Re-evaluation.   PLAN FOR NEXT SESSION: review and update HEP, initiate aquatic PT, LE strengthening    Lanice Shirts PT 04/08/22 10:01 AM Phone: [phone] Fax: [phone]

## 2022-04-09 ENCOUNTER — Ambulatory Visit (INDEPENDENT_AMBULATORY_CARE_PROVIDER_SITE_OTHER): Payer: Medicare Other

## 2022-04-09 DIAGNOSIS — Z1211 Encounter for screening for malignant neoplasm of colon: Secondary | ICD-10-CM

## 2022-04-09 DIAGNOSIS — Z0000 Encounter for general adult medical examination without abnormal findings: Secondary | ICD-10-CM

## 2022-04-09 DIAGNOSIS — E119 Type 2 diabetes mellitus without complications: Secondary | ICD-10-CM | POA: Diagnosis not present

## 2022-04-09 NOTE — Progress Notes (Signed)
Subjective:   Scott Tucker is a 56 y.o. male who presents for an Initial Medicare Annual Wellness Visit.  Review of Systems           Objective:    Today's Vitals   04/09/22 1556  PainSc: 9    There is no height or weight on file to calculate BMI.     04/09/2022    3:59 PM 03/05/2022   12:20 PM 12/13/2021    3:16 PM 12/13/2021    3:00 PM 12/04/2021    6:12 AM 11/28/2021    8:18 AM 11/30/2020   11:28 AM  Advanced Directives  Does Patient Have a Medical Advance Directive? No No  No No No No  Would patient like information on creating a medical advance directive? No - Patient declined No - Patient declined No - Patient declined  No - Patient declined Yes (MAU/Ambulatory/Procedural Areas - Information given) No - Patient declined    Current Medications (verified) Outpatient Encounter Medications as of 04/09/2022  Medication Sig   Cholecalciferol (VITAMIN D3) 50 MCG (2000 UT) capsule Take 1 capsule (2,000 Units total) by mouth daily.   ALPRAZolam (XANAX) 0.5 MG tablet Take one at the MRI after signing paperwork then may repeat in 30 minutes.   amLODipine (NORVASC) 10 MG tablet Take 1 tablet (10 mg total) by mouth once daily.   aspirin EC 81 MG tablet Take 1 tablet (81 mg total) by mouth daily. Swallow whole.   atorvastatin (LIPITOR) 40 MG tablet Take 1 tablet (40 mg total) by mouth daily.   b complex vitamins capsule Take 1 capsule by mouth daily.   camphor-menthol (SARNA) lotion Apply topically as needed for itching.   cetirizine (ZYRTEC) 10 MG tablet Take 1 tablet (10 mg total) by mouth daily.   docusate sodium (COLACE) 100 MG capsule Take 1 capsule (100 mg total) by mouth 2 (two) times daily.   DULoxetine (CYMBALTA) 60 MG capsule Take 1 capsule (60 mg total) by mouth daily.   empagliflozin (JARDIANCE) 25 MG TABS tablet Take 1 tablet (25 mg total) by mouth once daily before breakfast.   ergocalciferol (VITAMIN D2) 1.25 MG (50000 UT) capsule Take 1 capsule (50,000 Units total)  by mouth once a week.   losartan (COZAAR) 25 MG tablet Take 1 tablet (25 mg total) by mouth once daily. (Please make overdue appt with Dr. Eldridge Dace before anymore refills. Thank you 1st attempt)   methocarbamol (ROBAXIN) 500 MG tablet Take 1 tablet (500 mg total) by mouth every 6 (six) hours as needed for muscle spasms.   nitroGLYCERIN (NITROSTAT) 0.4 MG SL tablet Place 1 tablet (0.4 mg total) under the tongue every 5 (five) minutes as needed for chest pain.   pregabalin (LYRICA) 200 MG capsule Take 1 capsule (200 mg total) by mouth 3 (three) times daily.   triamcinolone cream (KENALOG) 0.1 % Apply topically 3 (three) times daily.   zolpidem (AMBIEN CR) 12.5 MG CR tablet Take 1 tablet (12.5 mg total) by mouth at bedtime as needed for sleep.   No facility-administered encounter medications on file as of 04/09/2022.    Allergies (verified) Latex   History: Past Medical History:  Diagnosis Date   Anginal pain (HCC)    Anxiety    Arthritis    Chronic back pain    Coronary artery disease    Diabetes mellitus without complication (HCC)    type 2   Dyspnea    Headache    HTN (hypertension)  Neuromuscular disorder [facility])    Past Surgical History:  Procedure Laterality Date   ANTERIOR CERVICAL DECOMP/DISCECTOMY FUSION N/A 12/04/2020   Procedure: ANTERIOR CERVICAL DISCECTOMY FUSION C3-4, C4-5, ALLOGRAFT, PLATE;  Surgeon: Eldred Manges, MD;  Location: [facility] OR;  Service: Orthopedics;  Laterality: N/A;  needs RNFA   BACK SURGERY     1992 and 2003(dr kritzer)   CARDIAC CATHETERIZATION     SHOULDER SURGERY Right    2017   TONSILLECTOMY     removed as a child   Family History  Problem Relation Age of Onset   Heart disease Maternal Uncle    Social History   Socioeconomic History   Marital status: Single    Spouse name: Not on file   Number of children: 2   Years of education: Not on file   Highest education level: Not on file  Occupational History   Not on file  Tobacco Use    Smoking status: Never   Smokeless tobacco: Never  Vaping Use   Vaping Use: Never used  Substance and Sexual Activity   Alcohol use: Yes    Alcohol/week: 3.0 standard drinks of alcohol    Types: 3 Glasses of wine per week   Drug use: Not Currently    Types: Marijuana    Comment: smokes mostly every day   Sexual activity: Not Currently  Other Topics Concern   Not on file  Social History Narrative   Not on file   Social Determinants of Health   Financial Resource Strain: Not on file  Food Insecurity: Not on file  Transportation Needs: Not on file  Physical Activity: Not on file  Stress: Not on file  Social Connections: Not on file    Tobacco Counseling Counseling given: Not Answered   Clinical Intake:     Pain : 0-10 Pain Score: 9  Pain Location: Back Pain Orientation: Lower     Diabetes: Yes CBG done?: No     Diabetic?Yes         Activities of Daily Living    04/09/2022    3:59 PM 04/09/2022    3:58 PM  In your present state of health, do you have any difficulty performing the following activities:  Hearing? 0 0  Vision? 0 0  Difficulty concentrating or making decisions? 1 1  Walking or climbing stairs? 1 1  Dressing or bathing? 0 0  Doing errands, shopping? 0 0  Preparing Food and eating ? N   Using the Toilet? N   In the past six months, have you accidently leaked urine? N   Do you have problems with loss of bowel control? N   Managing your Medications? N   Managing your Finances? N   Housekeeping or managing your Housekeeping? N     Patient Care Team: Grayce Sessions, NP as PCP - General (Internal Medicine) Corky Crafts, MD as PCP - Cardiology (Cardiology)  Indicate any recent Medical Services you may have received from other than [facility] providers in the past year (date may be approximate).     Assessment:   This is a routine wellness examination for Scott Tucker.  Hearing/Vision screen No results found.  Dietary issues and  exercise activities discussed:     Goals Addressed   None   Depression Screen    04/09/2022    3:57 PM 01/25/2022   10:38 AM 01/01/2022    2:58 PM 10/25/2021    8:38 AM 09/11/2021    9:10 AM 09/06/2021  8:43 AM 04/26/2021    8:30 AM  PHQ 2/9 Scores  PHQ - 2 Score 4 6 5 4 3  0 3  PHQ- 9 Score 15 20 13 12 12 9      Fall Risk    04/09/2022    3:57 PM 01/28/2022    1:19 PM 01/25/2022   10:38 AM 01/01/2022    2:57 PM 10/25/2021    8:38 AM  Fall Risk   Falls in the past year? 1 1 1 1  0  Comment    Last fall 12/2021 no major injury.   Number falls in past yr: 1 0 1 1   Injury with Fall? 0 1 0 0   Comment  knee and hips       FALL RISK PREVENTION PERTAINING TO THE HOME:  Any stairs in or around the home? No  If so, are there any without handrails? No  Home free of loose throw rugs in walkways, pet beds, electrical cords, etc? Yes  Adequate lighting in your home to reduce risk of falls? Yes   ASSISTIVE DEVICES UTILIZED TO PREVENT FALLS:  Life alert? No  Use of a cane, walker or w/c? Yes  Grab bars in the bathroom? Yes  Shower chair or bench in shower? Yes  Elevated toilet seat or a handicapped toilet? No   TIMED UP AND GO:  Was the test performed? No .  Length of time to ambulate 10 feet: 10 sec.   Gait unsteady with use of assistive device, provider informed and education provided.   Cognitive Function:    04/09/2022    3:59 PM  MMSE - Mini Mental State Exam  Orientation to time 5  Orientation to Place 5  Registration 3  Attention/ Calculation 5  Recall 3  Language- name 2 objects 2  Language- repeat 1  Language- follow 3 step command 3  Language- read & follow direction 1  Write a sentence 1  Copy design 1  Total score 30        Immunizations Immunization History  Administered Date(s) Administered   PFIZER(Purple Top)SARS-COV-2 Vaccination 06/13/2020, 07/05/2020   Tdap 03/16/2019    TDAP status: Due, Education has been provided regarding the  importance of this vaccine. Advised may receive this vaccine at local pharmacy or Health Dept. Aware to provide a copy of the vaccination record if obtained from local pharmacy or Health Dept. Verbalized acceptance and understanding.  Flu Vaccine status: Declined, Education has been provided regarding the importance of this vaccine but patient still declined. Advised may receive this vaccine at local pharmacy or Health Dept. Aware to provide a copy of the vaccination record if obtained from local pharmacy or Health Dept. Verbalized acceptance and understanding.  Pneumococcal vaccine status: Declined,  Education has been provided regarding the importance of this vaccine but patient still declined. Advised may receive this vaccine at local pharmacy or Health Dept. Aware to provide a copy of the vaccination record if obtained from local pharmacy or Health Dept. Verbalized acceptance and understanding.   Covid-19 vaccine status: Declined, Education has been provided regarding the importance of this vaccine but patient still declined. Advised may receive this vaccine at local pharmacy or Health Dept.or vaccine clinic. Aware to provide a copy of the vaccination record if obtained from local pharmacy or Health Dept. Verbalized acceptance and understanding.  Pt has received 2 Pfizer vaccines for COVID. Pt had the first vaccine on 06/13/20 and the second on 2/23/202  Qualifies for Shingles Vaccine? Yes  Zostavax completed No   Shingrix Completed?: No.    Education has been provided regarding the importance of this vaccine. Patient has been advised to call insurance company to determine out of pocket expense if they have not yet received this vaccine. Advised may also receive vaccine at local pharmacy or Health Dept. Verbalized acceptance and understanding.  Screening Tests Health Maintenance  Topic Date Due   OPHTHALMOLOGY EXAM  Never done   Diabetic kidney evaluation - Urine ACR  Never done   Zoster  Vaccines- Shingrix (1 of 2) Never done   COLONOSCOPY (Pts [id number] Insurance coverage will need to be confirmed)  Never done   COVID-19 Vaccine (3 - Pfizer risk series) 08/02/2020   INFLUENZA VACCINE  08/11/2022 (Originally 12/11/2021)   HEMOGLOBIN A1C  07/26/2022   FOOT EXAM  10/26/2022   Diabetic kidney evaluation - GFR measurement  12/25/2022   Medicare Annual Wellness (AWV)  04/10/2023   Hepatitis C Screening  Completed   HIV Screening  Completed   HPV VACCINES  Aged Out    Health Maintenance  Health Maintenance Due  Topic Date Due   OPHTHALMOLOGY EXAM  Never done   Diabetic kidney evaluation - Urine ACR  Never done   Zoster Vaccines- Shingrix (1 of 2) Never done   COLONOSCOPY (Pts [id number] Insurance coverage will need to be confirmed)  Never done   COVID-19 Vaccine (3 - Pfizer risk series) 08/02/2020    Colorectal cancer screening: Referral to GI placed on 04/09/22. Pt aware the office will call re: appt.  Lung Cancer Screening: (Low Dose CT Chest recommended if Age 19-80 years, 30 pack-year currently smoking OR have quit w/in 15years.) does not qualify.   Lung Cancer Screening Referral: Pt doesn't smoke   Additional Screening:  Hepatitis C Screening: does not qualify; Completed 12/22/19  Vision Screening: Recommended annual ophthalmology exams for early detection of glaucoma and other disorders of the eye. Is the patient up to date with their annual eye exam?  No  Who is the provider or what is the name of the office in which the patient attends annual eye exams? N/A If pt is not established with a provider, would they like to be referred to a provider to establish care? Yes .   Dental Screening: Recommended annual dental exams for proper oral hygiene  Community Resource Referral / Chronic Care Management: CRR required this visit?  No   CCM required this visit?  No      Plan:     I have personally reviewed and noted the following in the patient's chart:    Medical and social history Use of alcohol, tobacco or illicit drugs  Current medications and supplements including opioid prescriptions. Patient is not currently taking opioid prescriptions. Functional ability and status Nutritional status Physical activity Advanced directives List of other physicians Hospitalizations, surgeries, and ER visits in previous 12 months Vitals Screenings to include cognitive, depression, and falls Referrals and appointments  In addition, I have reviewed and discussed with patient certain preventive protocols, quality metrics, and best practice recommendations. A written personalized care plan for preventive services as well as general preventive health recommendations were provided to patient.     Herbert DeanerJay'A R Robertson, RMA   04/09/2022   Nurse Notes:

## 2022-04-11 DIAGNOSIS — Z008 Encounter for other general examination: Secondary | ICD-10-CM | POA: Diagnosis not present

## 2022-04-15 ENCOUNTER — Ambulatory Visit: Payer: Medicare Other

## 2022-04-15 ENCOUNTER — Ambulatory Visit (INDEPENDENT_AMBULATORY_CARE_PROVIDER_SITE_OTHER): Payer: Self-pay | Admitting: *Deleted

## 2022-04-15 NOTE — Telephone Encounter (Signed)
3rd attempt to contact patient on [phone]  regarding medication refill request. No answer , LVMTCB [phone].

## 2022-04-15 NOTE — Telephone Encounter (Addendum)
Summary: Pt requests that a nurse return his call regarding all his prescriptions.   Pt requests that a nurse or Marcelino Duster return his call regarding all his prescriptions. Pt stated he went to his pharmacy and he was told no prescriptions were sent in. Pt does not have the names of the medications but is very upset that he has been without what he says is his "happy medicine". Pt requests call back.Cb# [phone]         Attempted to call patient regarding his medications- left message to call office on VM.

## 2022-04-15 NOTE — Telephone Encounter (Signed)
Attempted to call patient- left message to call office on VM. 

## 2022-04-16 NOTE — Telephone Encounter (Signed)
Will forward to provider  

## 2022-04-17 ENCOUNTER — Other Ambulatory Visit: Payer: Self-pay

## 2022-04-17 ENCOUNTER — Ambulatory Visit: Payer: Medicare Other | Admitting: Physical Therapy

## 2022-04-18 ENCOUNTER — Ambulatory Visit: Payer: Medicare Other | Admitting: Physical Medicine & Rehabilitation

## 2022-04-18 ENCOUNTER — Other Ambulatory Visit (INDEPENDENT_AMBULATORY_CARE_PROVIDER_SITE_OTHER): Payer: Self-pay | Admitting: Primary Care

## 2022-04-18 DIAGNOSIS — I10 Essential (primary) hypertension: Secondary | ICD-10-CM

## 2022-04-18 DIAGNOSIS — Z760 Encounter for issue of repeat prescription: Secondary | ICD-10-CM

## 2022-04-23 ENCOUNTER — Ambulatory Visit: Payer: Medicare Other

## 2022-04-23 NOTE — Therapy (Incomplete)
OUTPATIENT PHYSICAL THERAPY TREATMENT NOTE   Patient Name: Scott Tucker MRN: [id number] DOB:10/20/1965, 56 y.o., male Today's Date: 04/23/2022  PCP: Kerin Perna, NP   REFERRING PROVIDER: Jessy Oto, MD      Past Medical History:  Diagnosis Date   Anginal pain [facility])    Anxiety    Arthritis    Chronic back pain    Coronary artery disease    Diabetes mellitus without complication ([place])    type 2   Dyspnea    Headache    HTN (hypertension)    Neuromuscular disorder [facility])    Past Surgical History:  Procedure Laterality Date   ANTERIOR CERVICAL DECOMP/DISCECTOMY FUSION N/A 12/04/2020   Procedure: ANTERIOR CERVICAL DISCECTOMY FUSION C3-4, C4-5, ALLOGRAFT, PLATE;  Surgeon: Marybelle Killings, MD;  Location: [place];  Service: Orthopedics;  Laterality: N/A;  needs [place] and 2003(dr kritzer)   CARDIAC CATHETERIZATION     SHOULDER SURGERY Right    2017   TONSILLECTOMY     removed as a child   Patient Active Problem List   Diagnosis Date Noted   Depressive reaction    Leukocytosis    Neuropathic pain    Spondylolisthesis, lumbar region 12/04/2021    Class: Chronic   Degenerative disc disease, lumbar 12/04/2021   Lumbar stenosis 12/04/2021   Status post lumbar spinal fusion 12/04/2021   Spinal stenosis of lumbar region 05/30/2021   S/P cervical spinal fusion 12/18/2020   Chronic radicular cervical pain 05/01/2020   Foraminal stenosis of cervical region (right) 05/01/2020   Chronic pain syndrome 05/01/2020   History of lumbar fusion 05/01/2020   HTN (hypertension)    Diabetes mellitus without complication (HCC)    Chronic back pain    Coronary artery disease    CAD (coronary artery disease) 03/22/2019   Hypertension 02/23/2019   Hyperlipidemia 02/23/2019   Chest pain 02/22/2019    THERAPY DIAG:  No diagnosis found.   Rationale for Evaluation and Treatment Rehabilitation  REFERRING DIAG: M51.36 (ICD-10-CM) - Degenerative disc  disease, lumbar M43.16 (ICD-10-CM) - Spondylolisthesis, lumbar region    PERTINENT HISTORY: Assessment & Plan:2.5 months post op L3-4 and L4-5 left TLIF for spinal stenosis with congenital foramenal narrowing. Has bilateral hypesthesias medial thighs and calfs.    Chief Complaint:       Chief Complaint  Patient presents with   Lower Back - Follow-up  Bilateral leg numbness and tingling.  SLR is negative  Weak right more than left Quadriceps. Remainder intact. CT with right more than left foramenal narrowing L3 and L4  PRECAUTIONS/RESTRICTIONS:    Avoid frequent bending and stooping  No lifting greater than 10 lbs. May use ice or moist heat for pain. Weight loss is of benefit. Will start a pool exercise rehabilitation program. Presently with right quadriceps weakness. Obtain MRI to assess for residual foramenal narrowing L3 and L4.  Exercise is important to improve your indurance and does allow people to function better inspite of back pain  SUBJECTIVE:  Aquatic PT has been helpful.  Pain rated at 8/10 in RLE as well as tenderness to touch.  Reports pain in low back and L hip rated at 8/10.    PAIN:  Are you having pain? Yes: NPRS scale: 6/10 Pain location: low back and BLEs Pain description: ache and paresthesias Aggravating factors: activity Relieving factors: marijuana  OBJECTIVE: (objective measures completed at initial evaluation unless otherwise dated)  DIAGNOSTIC FINDINGS:  MRI 03/02/22 results pending 04/08/22 IMPRESSION: 1. Previous discectomy and fusion from L3 through S1. Previous hardware removal at S1. Sufficient patency of the canal and foramina at those levels, allowing for artifact from fusion hardware. 2. L2-3: Mild bulging of the disc in both posterolateral to foraminal regions. Mild facet and ligamentous hypertrophy. Mild bilateral foraminal narrowing without visible neural compression. Minimal worsening of these findings since February of 2022. 3.  T11-12: Disc bulge more prominent towards the left. Facet and ligamentous hypertrophy. Bilateral foraminal narrowing as seen previously.     Electronically Signed   By: Nelson Chimes M.D.   On: 03/05/2022 09:10   PATIENT SURVEYS:  FOTO TBD   SCREENING FOR RED FLAGS: negative                    SENSATION: Not tested   MUSCLE LENGTH: Hamstrings: Right 70 deg; Left 50 deg T   POSTURE:  not tested    PALPATION: Not tested   LUMBAR ROM: deferred per post op restrictions   AROM eval  Flexion    Extension    Right lateral flexion    Left lateral flexion    Right rotation    Left rotation     (Blank rows = not tested)   LOWER EXTREMITY ROM:      Active  Right eval Left eval R 04/08/22 L  04/08/22  Hip flexion     95d 110d  Hip extension        Hip abduction        Hip adduction        Hip internal rotation        Hip external rotation        Knee flexion [facility]    Knee extension [facility]    Ankle dorsiflexion [facility] WFL    Ankle plantarflexion [facility] WFL    Ankle inversion        Ankle eversion         (Blank rows = not tested)   LOWER EXTREMITY MMT:     MMT Right eval Left eval R  04/08/22 L  04/08/22  Hip flexion 3+ 3+ 3+ 4-  Hip extension 3+ 3+ 3+ 3+  Hip abduction 3+ 3+ 3+ 3+  Hip adduction        Hip internal rotation        Hip external rotation        Knee flexion 3+ 3+ 4- 4-  Knee extension 3+ 3+ 4- 4-  Ankle dorsiflexion        Ankle plantarflexion 3+ 3+ 4- 4-  Ankle inversion        Ankle eversion        Core/trunk  _0 (Blank rows = not tested)   LUMBAR SPECIAL TESTS:  Straight leg raise test: Negative and Slump test: Negative 04/08/22 positive B for low back pain   FUNCTIONAL TESTS:  30 seconds chair stand test 1 stand arms crossed  04/08/22 30s chair stand test 4 stands with arms crossed   GAIT: Distance walked: 29fx2 Assistive device utilized: None Level of assistance: Complete Independence Comments: antalgic gait with  lumbar support  HEP: Access Code: [email or web address]: [email or web address] Date: 04/08/2022 Prepared by: JSharlynn Oliphant Exercises - Supine Posterior Pelvic Tilt  - 5 x daily - 5 x weekly - 1 sets - 5 reps - 3s hold - Supine March  - 5 x daily - 5 x weekly -  1 sets - 5 reps - Standing Heel Raise with Support  - 5 x daily - 5 x weekly - 1 sets - 5 reps - Seated Long Arc Quad  - 5 x daily - 5 x weekly - 1 sets - 5 reps  [facility] Adult PT Treatment:                                                DATE: 04-24-22     [facility] Adult PT Treatment:                                                DATE: 04/08/22 Therapeutic Exercise: PPT 3s x15 [id number] B FAQs 10/10 Heel raises 10x Toe raises 10x Standing marches 10/10  Re-assessment of progress towards goals  [facility] Adult PT Treatment:                                                DATE: 03/28/2022 Aquatic therapy at [address] - therapeutic pool temp 92 degrees Pt enters building ambulating with walking stick  Treatment took place in water 3.8 to  4 ft 8 in.feet deep depending upon activity.  Pt entered and exited the pool via stair and handrails independently   Therapeutic Exercise: Walking forward/backwards/sidestepping Sidestepping with Yellow  DB shoulder abd/adduction x2 laps Stretching: Hip flexor stretch on bottom step x30" BIL Hamstring stretch on bottom step x30" BIL At edge of pool: Walking march 3 laps Heel raises 2x20 Hip hinge EOP 2x20 Hip flexion -> extension 2x10 BIL Weighted ball gentle rotation with shoulders flexed - 20x Hip flexion -> IR/ER x10 ea Kick board push/pull - 20x Hip abd/add x20 BIL Squats x20 Riding pool noodle across pool using 2 yellow DB - 2 laps Seated on submerged bench: Bicycle kicks x1' Flutter kicks x1' Scissor kicks x1'  Pt requires the buoyancy of water for active assisted exercises with buoyancy supported for strengthening and AROM exercises. Hydrostatic pressure  also supports joints by unweighting joint load by at least 50 % in 3-4 feet depth water. 80% in chest to neck deep water. Water will provide assistance with movement using the current and laminar flow while the buoyancy reduces weight bearing. Pt requires the viscosity of the water for resistance with strengthening exercises.  TREATMENT 03/14/22:  Aquatic therapy at [address] - therapeutic pool temp 92 degrees Pt enters building independently.  Treatment took place in water 3.8 to  4 ft 8 in.feet deep depending upon activity.  Pt entered and exited the pool via stair and handrails    Aquatic Therapy:  Water walking for warm up  Stretching: Hip flexor on bottom step x30" BIL Hamstring stretch on bottom step x30" BIL Figure 4 squat stretch, BIL UE support 2x30" BIL (NT)  At edge of pool, pt performed LE exercise: Standing march x20 Heel raises 2x20 Hamstring curl x20 BIL Hip flexion -> extension 2x10 BIL Hip abd/add x20 BIL Squats x20 Mini lunge lateral x20 Rainbow DB push down - abd, flexion, alt abd, alt flexion - 20x ea  Pt  requires the buoyancy of water for active assisted exercises with buoyancy supported for strengthening and AROM exercises. Hydrostatic pressure also supports joints by unweighting joint load by at least 50 % in 3-4 feet depth water. 80% in chest to neck deep water. Water will provide assistance with movement using the current and laminar flow while the buoyancy reduces weight bearing. Pt requires the viscosity of the water for resistance with strengthening exercises.   ASSESSMENT:   CLINICAL IMPRESSION: Today's session assessed progress towards goals and updated HEP.  FOTO survey taken.  STGs have been met, HEP updated.  Gait demos increased upright posture and increased cadence.  Recommend continue OPPT 4 additional weeks and transition out of aquatics.        OBJECTIVE IMPAIRMENTS: Abnormal gait, decreased activity tolerance, decreased  balance, decreased endurance, decreased knowledge of condition, decreased mobility, difficulty walking, decreased ROM, decreased strength, increased muscle spasms, impaired flexibility, improper body mechanics, postural dysfunction, and pain.    ACTIVITY LIMITATIONS: carrying, lifting, bending, sitting, standing, squatting, stairs, and bed mobility   PERSONAL FACTORS: Fitness, Past/current experiences, Time since onset of injury/illness/exacerbation, and 1 comorbidity: previous lumbar surgeries   are also affecting patient's functional outcome.    REHAB POTENTIAL: Fair based on multiple surgeries  and chronicity of symptoms   CLINICAL DECISION MAKING: Evolving/moderate complexity   EVALUATION COMPLEXITY: Low     GOALS: Goals reviewed with patient? Yes   SHORT TERM GOALS: Target date: 03/19/2022   Patient to demonstrate independence in HEP  Baseline:[id number] Goal status: met   2.  Initiate aquatics program Baseline: TBD Goal status: MET Initiated 03/14/2022     LONG TERM GOALS: Target date: 04/02/2022   70d L SLR Baseline: 50d SLR; 04/08/22 R 45d, L 60d Goal status: Ongoing   2.  Increase 30s stand test to 5 reps arms crossed Baseline: 1 stand arms crossed; 04/08/22 5 stands Goal status: Met   3.  Increase BLE strength to 4-/5 Baseline:  MMT Right eval Left eval  Hip flexion 3+ 3+  Hip extension 3+ 3+  Hip abduction 3+ 3+  Hip adduction      Hip internal rotation      Hip external rotation      Knee flexion 3+ 3+  Knee extension 3+ 3+  Ankle dorsiflexion      Ankle plantarflexion 3+ 3+    Goal status: INITIAL     PLAN:   PT FREQUENCY: 2x/week   PT DURATION: 4 weeks   PLANNED INTERVENTIONS: Therapeutic exercises, Therapeutic activity, Neuromuscular re-education, Balance training, Gait training, Patient/Family education, Self Care, Joint mobilization, Stair training, DME instructions, Aquatic Therapy, Dry Needling, and Re-evaluation.   PLAN FOR NEXT  SESSION: review and update HEP, initiate aquatic PT, LE strengthening    ***

## 2022-04-24 ENCOUNTER — Ambulatory Visit: Payer: Medicare Other | Admitting: Physical Therapy

## 2022-04-25 DIAGNOSIS — D72829 Elevated white blood cell count, unspecified: Secondary | ICD-10-CM | POA: Diagnosis not present

## 2022-04-25 DIAGNOSIS — M48061 Spinal stenosis, lumbar region without neurogenic claudication: Secondary | ICD-10-CM | POA: Diagnosis not present

## 2022-04-25 DIAGNOSIS — M792 Neuralgia and neuritis, unspecified: Secondary | ICD-10-CM | POA: Diagnosis not present

## 2022-04-26 ENCOUNTER — Other Ambulatory Visit (INDEPENDENT_AMBULATORY_CARE_PROVIDER_SITE_OTHER): Payer: Self-pay

## 2022-04-26 DIAGNOSIS — E782 Mixed hyperlipidemia: Secondary | ICD-10-CM

## 2022-04-26 DIAGNOSIS — Z760 Encounter for issue of repeat prescription: Secondary | ICD-10-CM

## 2022-04-29 ENCOUNTER — Ambulatory Visit: Payer: Medicare Other

## 2022-05-01 ENCOUNTER — Ambulatory Visit: Payer: Medicare Other | Admitting: Physical Therapy

## 2022-05-07 ENCOUNTER — Ambulatory Visit: Payer: Medicare Other | Admitting: Physical Therapy

## 2022-05-12 DIAGNOSIS — Z008 Encounter for other general examination: Secondary | ICD-10-CM | POA: Diagnosis not present

## 2022-05-14 ENCOUNTER — Telehealth: Payer: Self-pay | Admitting: Primary Care

## 2022-05-14 ENCOUNTER — Other Ambulatory Visit (HOSPITAL_COMMUNITY): Payer: Self-pay

## 2022-05-14 ENCOUNTER — Other Ambulatory Visit: Payer: Self-pay

## 2022-05-14 NOTE — Telephone Encounter (Addendum)
Rx list faxed Confirmation received.

## 2022-05-14 NOTE — Telephone Encounter (Signed)
Copied from [place] [phone]. Topic: General - Other >> Apr 25, 2022 10:20 AM Leitha Schuller wrote: Pt requesting his [facility]  Fax [phone]

## 2022-05-15 ENCOUNTER — Ambulatory Visit: Payer: Medicare Other | Admitting: Physical Therapy

## 2022-05-16 ENCOUNTER — Other Ambulatory Visit (HOSPITAL_COMMUNITY): Payer: Self-pay

## 2022-05-16 ENCOUNTER — Encounter: Payer: Self-pay | Admitting: Physical Medicine & Rehabilitation

## 2022-05-16 ENCOUNTER — Encounter: Payer: Medicare Other | Attending: Physical Medicine & Rehabilitation | Admitting: Physical Medicine & Rehabilitation

## 2022-05-16 VITALS — BP 162/83 | HR 73 | Ht 66.0 in | Wt 196.0 lb

## 2022-05-16 DIAGNOSIS — M792 Neuralgia and neuritis, unspecified: Secondary | ICD-10-CM | POA: Diagnosis present

## 2022-05-16 DIAGNOSIS — M25562 Pain in left knee: Secondary | ICD-10-CM | POA: Diagnosis present

## 2022-05-16 DIAGNOSIS — M25561 Pain in right knee: Secondary | ICD-10-CM | POA: Diagnosis present

## 2022-05-16 DIAGNOSIS — M48061 Spinal stenosis, lumbar region without neurogenic claudication: Secondary | ICD-10-CM | POA: Insufficient documentation

## 2022-05-16 DIAGNOSIS — G8929 Other chronic pain: Secondary | ICD-10-CM | POA: Insufficient documentation

## 2022-05-16 DIAGNOSIS — M62838 Other muscle spasm: Secondary | ICD-10-CM | POA: Diagnosis present

## 2022-05-16 MED ORDER — DULOXETINE HCL 30 MG PO CPEP
60.0000 mg | ORAL_CAPSULE | Freq: Every day | ORAL | 3 refills | Status: DC
  Filled 2022-05-16: qty 60, 30d supply, fill #0

## 2022-05-16 NOTE — Progress Notes (Addendum)
Subjective:    Patient ID: Scott Tucker, male    DOB: [birth year], 57 y.o.   MRN: [id number]  HPI Brief HPI:   Scott Tucker is a 57 y.o. male with a history of lumbar spondylolisthesis with stenosis and chronic lower back pain who was admitted on 12/04/2021 for transforaminal fusion L3/4 and L4/5 by Dr. Otelia Sergeant.  Treated postoperatively with steroids which were tapered.  Continues with left thigh neuropathic pain and Lyrica titrated upward.     Hospital Course: Scott Tucker was admitted to rehab 12/13/2021 for inpatient therapies to consist of PT, ST and OT at least three hours five days a week. Past admission physiatrist, therapy team and rehab RN have worked together to provide customized collaborative inpatient rehab. Norco changed to oxycodone 5-10 mg every 4 hours to limit acetaminophen. Lyrica increased to 150 mg BID on 8/4. MS Contin 15 mg BID added. K-pad applied. Steroids were tapered. Vit D supplement weekly. Lyrica increased to TID dosing on 8/9 as well as starting duloxetine. Follow-up xrays stable on 8/10.  WBC trended downward with steroid taper. Incision healing nicely without signs of infection. Hypokalemia resolved with supplementation. Mildly pruritic skin rash on back noted. Improved with anti-histamines and Kenalog cream. Neuropsychologic consult obtained on 8/14.   Visit 01/28/22   Scott Tucker is here for f/u after his lumbar fusion L3-4, L4-5 by Dr. Otelia Sergeant.  His greatest complaint is continued pain in his lower extremities.  He reports that he is having paresthesias on his anterior lateral thighs and legs and numbness in his posterior legs and thighs.  He continues to take the Lyrica 200 mg 3 times a day.  He has been out of his duloxetine and he also reports he was recently taking 30 mg of duloxetine instead of 60 mg because that is how it was sent to him from the pharmacy.  He was weaned off the methadone.  He has also used oxycodone 15 mg as needed.  Does not use it  frequently. He reports he still has some of this medication at home and while it provides benefit to his pain to does not reduce it to a comfortable level at current dose.    Interval History Scott Tucker is here for follow-up after his lumbar fusion by Dr. Otelia Sergeant.  Continues to report that his pain is not well-controlled.  It does sound like the burning pain in his legs is improved however.  He says he continues to have some pins-and-needles pain in his right calf.  He is unable to confirm which medications he is using.  It looks like he did order the Lyrica but it does not sound like he is using the duloxetine.  He reports pain in his knees where he fell a few months ago.  Pain is worse with ambulation.  He continues to use marijuana. Some stuff better  Using marijunanna Using lyrica Pain in his knees when he fell on them  months ago  Burning pain is better- MRI completed Lower back still hurts Duloxitine-?  Off his medications Has tylenol and ibuprofen -delivery of meds better Xray knees Tens  R calf - qutenza  Pain Inventory Average Pain 8 Pain Right Now 9 My pain is sharp, burning, dull, stabbing, and aching  In the last 24 hours, has pain interfered with the following? General activity 7 Relation with others 8 Enjoyment of life 7 What TIME of day is your pain at its worst? morning , daytime, evening, and  night Sleep (in general) Poor  Pain is worse with: walking, bending, sitting, standing, and some activites Pain improves with:  . Relief from Meds: 5  Family History  Problem Relation Age of Onset   Heart disease Maternal Uncle    Social History   Socioeconomic History   Marital status: Single    Spouse name: Not on file   Number of children: 2   Years of education: Not on file   Highest education level: Not on file  Occupational History   Not on file  Tobacco Use   Smoking status: Never   Smokeless tobacco: Never  Vaping Use   Vaping Use: Never used   Substance and Sexual Activity   Alcohol use: Yes    Alcohol/week: 3.0 standard drinks of alcohol    Types: 3 Glasses of wine per week   Drug use: Not Currently    Types: Marijuana    Comment: smokes mostly every day   Sexual activity: Not Currently  Other Topics Concern   Not on file  Social History Narrative   Not on file   Social Determinants of Health   Financial Resource Strain: Not on file  Food Insecurity: Not on file  Transportation Needs: Not on file  Physical Activity: Not on file  Stress: Not on file  Social Connections: Not on file   Past Surgical History:  Procedure Laterality Date   ANTERIOR CERVICAL DECOMP/DISCECTOMY FUSION N/A 12/04/2020   Procedure: ANTERIOR CERVICAL DISCECTOMY FUSION C3-4, C4-5, ALLOGRAFT, PLATE;  Surgeon: Eldred Manges, MD;  Location: [facility];  Service: Orthopedics;  Laterality: N/A;  needs RNFA   BACK SURGERY     1992 and 2003(dr kritzer)   CARDIAC CATHETERIZATION     SHOULDER SURGERY Right    2017   TONSILLECTOMY     removed as a child   Past Surgical History:  Procedure Laterality Date   ANTERIOR CERVICAL DECOMP/DISCECTOMY FUSION N/A 12/04/2020   Procedure: ANTERIOR CERVICAL DISCECTOMY FUSION C3-4, C4-5, ALLOGRAFT, PLATE;  Surgeon: Eldred Manges, MD;  Location: [facility];  Service: Orthopedics;  Laterality: N/A;  needs RNFA   BACK SURGERY     1992 and 2003(dr kritzer)   CARDIAC CATHETERIZATION     SHOULDER SURGERY Right    2017   TONSILLECTOMY     removed as a child   Past Medical History:  Diagnosis Date   Anginal pain (HCC)    Anxiety    Arthritis    Chronic back pain    Coronary artery disease    Diabetes mellitus without complication (HCC)    type 2   Dyspnea    Headache    HTN (hypertension)    Neuromuscular disorder (HCC)    BP (!) 162/83   Pulse 73   Ht 5\' 6"  (1.676 m)   Wt 196 lb (88.9 kg)   SpO2 98%   BMI 31.64 kg/m   Opioid Risk Score:   Fall Risk Score:  `1  Depression screen [facility] 2/9     05/16/2022     9:46 AM 04/09/2022    3:57 PM 01/25/2022   10:38 AM 01/01/2022    2:58 PM 10/25/2021    8:38 AM 09/11/2021    9:10 AM 09/06/2021    8:43 AM  Depression screen PHQ 2/9  Decreased Interest 0 1 3 3 2 3  0  Down, Depressed, Hopeless 0 3 3 2 2  0 0  PHQ - 2 Score 0 4 6 5 4 3  0  Altered sleeping  3 3 3 3 3 3   Tired, decreased energy  3 2 0 1 2 3   Change in appetite  0 0 1 0 0 0  Feeling bad or failure about yourself   2 3 3  0 0 0  Trouble concentrating  3 3 1 3 3 3   Moving slowly or fidgety/restless  0 2 0 1 1 0  Suicidal thoughts  0 1  0 0 0  PHQ-9 Score  15 20 13 12 12 9   Difficult doing work/chores     Extremely dIfficult        Review of Systems  Musculoskeletal:  Positive for back pain and gait problem.  All other systems reviewed and are negative.     Objective:   Physical Exam  Gen: no distress, normal appearing HEENT: oral mucosa pink and moist, NCAT Cardio: Reg rate Chest: normal effort, normal rate of breathing Abd: soft, non-distended Ext: no edema Psych: flat affect Skin: intact Neuro: alert and oriented x4, follows commands, strength 5/5 in b/l UE Strength 4/5 in b/l LE, appears to be limited by pain Sensation to LT intact in all 4 extremities, reports altered medial R knee and ankle Musculoskeletal:   Slump negative b/l No joint swelling noted Joint line tenderness both knees Antalgic gait, walks with cane  MRI L spine 03/05/22 IMPRESSION: 1. Previous discectomy and fusion from L3 through S1. Previous hardware removal at S1. Sufficient patency of the canal and foramina at those levels, allowing for artifact from fusion hardware. 2. L2-3: Mild bulging of the disc in both posterolateral to foraminal regions. Mild facet and ligamentous hypertrophy. Mild bilateral foraminal narrowing without visible neural compression. Minimal worsening of these findings since February of 2022. 3. T11-12: Disc bulge more prominent towards the left. Facet and ligamentous  hypertrophy. Bilateral foraminal narrowing as seen previously.    Assessment & Plan:   1. lumbar stenosis s/p lumbar fusion L3-4, L4-5 Dr. Otelia Sergeant             -Patient following with Dr. Arville Go, continue to follow as directed, does not appear any further surgical intervention is recommended  -MRI completed in October, mild foraminal narrowing L2-L3, disc bulge T11-T12  -Will order zynex device   2. Neuropathic pain             -Restart cymbalta, will plan to start at 30 mg dose and then increase to 60 mg dose.  Medication not ordered today patient will call and let me know his new pharmacy.  He reports he just started a new pharmacy and would like the medication to be sent there             -Continue lyrica at current dose             -Patient continuing to use marijuana, advised cessation   3. Leg spasms              -Continue Robaxin as needed   4.  Knee pain  -Knee x-rays ordered for further evaluation  06/10/23, called patient regarding refill of lyrica, Pt would need appointment for continuation of this medication. He says he will check with PCP to see if they will write it, if not he will call [facility] clinic to scheduled appointment.

## 2022-05-19 ENCOUNTER — Encounter: Payer: Self-pay | Admitting: Physical Medicine & Rehabilitation

## 2022-05-26 DIAGNOSIS — D72829 Elevated white blood cell count, unspecified: Secondary | ICD-10-CM | POA: Diagnosis not present

## 2022-05-26 DIAGNOSIS — M48061 Spinal stenosis, lumbar region without neurogenic claudication: Secondary | ICD-10-CM | POA: Diagnosis not present

## 2022-05-26 DIAGNOSIS — M792 Neuralgia and neuritis, unspecified: Secondary | ICD-10-CM | POA: Diagnosis not present

## 2022-06-12 ENCOUNTER — Other Ambulatory Visit: Payer: Self-pay

## 2022-06-12 ENCOUNTER — Other Ambulatory Visit (HOSPITAL_COMMUNITY): Payer: Self-pay

## 2022-06-12 ENCOUNTER — Other Ambulatory Visit: Payer: Self-pay | Admitting: Physical Medicine and Rehabilitation

## 2022-06-12 ENCOUNTER — Other Ambulatory Visit (INDEPENDENT_AMBULATORY_CARE_PROVIDER_SITE_OTHER): Payer: Self-pay | Admitting: Primary Care

## 2022-06-12 DIAGNOSIS — E782 Mixed hyperlipidemia: Secondary | ICD-10-CM

## 2022-06-12 DIAGNOSIS — Z760 Encounter for issue of repeat prescription: Secondary | ICD-10-CM

## 2022-06-12 DIAGNOSIS — I10 Essential (primary) hypertension: Secondary | ICD-10-CM

## 2022-06-12 DIAGNOSIS — Z008 Encounter for other general examination: Secondary | ICD-10-CM | POA: Diagnosis not present

## 2022-06-12 MED ORDER — ATORVASTATIN CALCIUM 40 MG PO TABS
40.0000 mg | ORAL_TABLET | Freq: Every day | ORAL | 1 refills | Status: DC
  Filled 2022-06-12: qty 90, 90d supply, fill #0

## 2022-06-12 MED ORDER — AMLODIPINE BESYLATE 10 MG PO TABS
10.0000 mg | ORAL_TABLET | Freq: Every day | ORAL | 1 refills | Status: DC
  Filled 2022-06-12: qty 90, 90d supply, fill #0

## 2022-06-12 MED ORDER — ERGOCALCIFEROL 1.25 MG (50000 UT) PO CAPS
50000.0000 [IU] | ORAL_CAPSULE | ORAL | 0 refills | Status: DC
  Filled 2022-06-12: qty 12, 84d supply, fill #0

## 2022-06-12 MED ORDER — EMPAGLIFLOZIN 25 MG PO TABS
25.0000 mg | ORAL_TABLET | Freq: Every day | ORAL | 1 refills | Status: DC
  Filled 2022-06-12: qty 90, 90d supply, fill #0

## 2022-06-13 ENCOUNTER — Other Ambulatory Visit (HOSPITAL_COMMUNITY): Payer: Self-pay

## 2022-06-13 ENCOUNTER — Other Ambulatory Visit: Payer: Self-pay

## 2022-06-14 ENCOUNTER — Other Ambulatory Visit (HOSPITAL_COMMUNITY): Payer: Self-pay

## 2022-06-17 ENCOUNTER — Other Ambulatory Visit (HOSPITAL_COMMUNITY): Payer: Self-pay

## 2022-06-17 ENCOUNTER — Other Ambulatory Visit: Payer: Self-pay

## 2022-06-25 MED ORDER — TRIAMCINOLONE ACETONIDE 0.1 % EX CREA: TOPICAL_CREAM | Freq: Three times a day (TID) | CUTANEOUS | 0 refills | Status: DC

## 2022-06-26 DIAGNOSIS — M48061 Spinal stenosis, lumbar region without neurogenic claudication: Secondary | ICD-10-CM | POA: Diagnosis not present

## 2022-06-26 DIAGNOSIS — M792 Neuralgia and neuritis, unspecified: Secondary | ICD-10-CM | POA: Diagnosis not present

## 2022-06-26 DIAGNOSIS — D72829 Elevated white blood cell count, unspecified: Secondary | ICD-10-CM | POA: Diagnosis not present

## 2022-06-28 ENCOUNTER — Other Ambulatory Visit (INDEPENDENT_AMBULATORY_CARE_PROVIDER_SITE_OTHER): Payer: Self-pay | Admitting: Primary Care

## 2022-06-28 DIAGNOSIS — I10 Essential (primary) hypertension: Secondary | ICD-10-CM

## 2022-06-28 DIAGNOSIS — Z760 Encounter for issue of repeat prescription: Secondary | ICD-10-CM

## 2022-07-01 ENCOUNTER — Other Ambulatory Visit: Payer: Self-pay

## 2022-07-05 NOTE — Telephone Encounter (Signed)
Pt is calling to see if he can get a short supply of medication until his next office visit. losartan (COZAAR) 25 MG tablet  Pt states that he has been out of medication for a week.   Pt appt is for 07/26/2022.

## 2022-07-06 ENCOUNTER — Other Ambulatory Visit (HOSPITAL_COMMUNITY): Payer: Self-pay

## 2022-07-06 NOTE — Telephone Encounter (Signed)
Will forward to provider  

## 2022-07-09 ENCOUNTER — Other Ambulatory Visit: Payer: Self-pay

## 2022-07-11 DIAGNOSIS — Z008 Encounter for other general examination: Secondary | ICD-10-CM | POA: Diagnosis not present

## 2022-07-19 ENCOUNTER — Encounter: Payer: Medicare Other | Admitting: Physical Medicine & Rehabilitation

## 2022-07-23 ENCOUNTER — Telehealth: Payer: Self-pay

## 2022-07-23 NOTE — Progress Notes (Signed)
Patient attempted to be outreached by Darrall Dears, PharmD Candidate on 07/22/2022 to discuss hypertension. Left voicemail for patient to return our call at their convenience at [phone].   Darrall Dears, PharmD Candidate   Joseph Art, Pharm.D. PGY-2 Ambulatory Care Pharmacy Resident

## 2022-07-24 ENCOUNTER — Encounter (INDEPENDENT_AMBULATORY_CARE_PROVIDER_SITE_OTHER): Payer: Self-pay

## 2022-07-25 DIAGNOSIS — M792 Neuralgia and neuritis, unspecified: Secondary | ICD-10-CM | POA: Diagnosis not present

## 2022-07-25 DIAGNOSIS — D72829 Elevated white blood cell count, unspecified: Secondary | ICD-10-CM | POA: Diagnosis not present

## 2022-07-25 DIAGNOSIS — M48061 Spinal stenosis, lumbar region without neurogenic claudication: Secondary | ICD-10-CM | POA: Diagnosis not present

## 2022-07-26 ENCOUNTER — Ambulatory Visit (INDEPENDENT_AMBULATORY_CARE_PROVIDER_SITE_OTHER): Payer: Medicare Other | Admitting: Primary Care

## 2022-07-26 ENCOUNTER — Telehealth (INDEPENDENT_AMBULATORY_CARE_PROVIDER_SITE_OTHER): Payer: Self-pay | Admitting: Primary Care

## 2022-07-26 NOTE — Telephone Encounter (Signed)
Attempted to reach patient about his appointment this morning. Left a message to call office to reschedule.

## 2022-07-30 ENCOUNTER — Encounter (INDEPENDENT_AMBULATORY_CARE_PROVIDER_SITE_OTHER): Payer: Self-pay | Admitting: Primary Care

## 2022-07-30 ENCOUNTER — Other Ambulatory Visit: Payer: Self-pay

## 2022-07-30 ENCOUNTER — Ambulatory Visit (INDEPENDENT_AMBULATORY_CARE_PROVIDER_SITE_OTHER): Payer: Medicare Other | Admitting: Primary Care

## 2022-07-30 VITALS — BP 145/82 | HR 79 | Resp 16 | Ht 66.0 in | Wt 187.8 lb

## 2022-07-30 DIAGNOSIS — W19XXXA Unspecified fall, initial encounter: Secondary | ICD-10-CM | POA: Diagnosis not present

## 2022-07-30 DIAGNOSIS — E119 Type 2 diabetes mellitus without complications: Secondary | ICD-10-CM

## 2022-07-30 DIAGNOSIS — I10 Essential (primary) hypertension: Secondary | ICD-10-CM

## 2022-07-30 DIAGNOSIS — G894 Chronic pain syndrome: Secondary | ICD-10-CM | POA: Diagnosis not present

## 2022-07-30 DIAGNOSIS — Z760 Encounter for issue of repeat prescription: Secondary | ICD-10-CM

## 2022-07-30 DIAGNOSIS — E782 Mixed hyperlipidemia: Secondary | ICD-10-CM

## 2022-07-30 DIAGNOSIS — Z1211 Encounter for screening for malignant neoplasm of colon: Secondary | ICD-10-CM

## 2022-07-30 MED ORDER — LOSARTAN POTASSIUM 25 MG PO TABS
25.0000 mg | ORAL_TABLET | Freq: Every day | ORAL | 1 refills | Status: DC
  Filled 2022-07-30: qty 90, 90d supply, fill #0
  Filled 2022-10-31: qty 90, 90d supply, fill #1

## 2022-07-30 MED ORDER — ATORVASTATIN CALCIUM 40 MG PO TABS
40.0000 mg | ORAL_TABLET | Freq: Every day | ORAL | 1 refills | Status: DC
  Filled 2022-07-30 – 2022-09-16 (×3): qty 90, 90d supply, fill #0
  Filled 2022-10-31 – 2023-01-26 (×2): qty 90, 90d supply, fill #1

## 2022-07-30 MED ORDER — METHOCARBAMOL 500 MG PO TABS
500.0000 mg | ORAL_TABLET | Freq: Four times a day (QID) | ORAL | 1 refills | Status: DC | PRN
  Filled 2022-07-30: qty 120, 30d supply, fill #0

## 2022-07-30 MED ORDER — VITAMIN D3 50 MCG (2000 UT) PO CAPS
2000.0000 [IU] | ORAL_CAPSULE | Freq: Every day | ORAL | 1 refills | Status: DC
  Filled 2022-07-30 – 2022-10-31 (×2): qty 90, 90d supply, fill #0

## 2022-07-30 MED ORDER — AMLODIPINE BESYLATE 10 MG PO TABS
10.0000 mg | ORAL_TABLET | Freq: Every day | ORAL | 1 refills | Status: DC
  Filled 2022-07-30 – 2022-09-16 (×3): qty 90, 90d supply, fill #0
  Filled 2022-10-31: qty 90, 90d supply, fill #1

## 2022-07-30 MED ORDER — EMPAGLIFLOZIN 25 MG PO TABS
25.0000 mg | ORAL_TABLET | Freq: Every day | ORAL | 1 refills | Status: DC
  Filled 2022-07-30 – 2022-09-16 (×3): qty 90, 90d supply, fill #0
  Filled 2022-12-26: qty 90, 90d supply, fill #1

## 2022-07-30 NOTE — Progress Notes (Signed)
[place], is a 57 y.o. male  [id number]  [id number]  DOB - Aug 09, 1965  Chief Complaint  Patient presents with   Diabetes   Hypertension       Subjective:   [name] is a 57 y.o. obese male here today for a follow up for the management of hypertension.  Patient has No headache, No chest pain, No abdominal pain - No Nausea, No new weakness tingling or numbness, No Cough - shortness of breath.  Also, prediabetes, denies polyuria, polydipsia, polyphasia, or vision changes. Patient fell getting out of the shower yesterday no bruising at this time or swelling pain is on the right side of hip ( chronic back pain) with back pain. He does have a unsteady gait and uses a cane.   No problems updated.  Allergies  Allergen Reactions   Latex Rash    Past Medical History:  Diagnosis Date   Anginal pain (HCC)    Anxiety    Arthritis    Chronic back pain    Coronary artery disease    Diabetes mellitus without complication (HCC)    type 2   Dyspnea    Headache    HTN (hypertension)    Neuromuscular disorder (HCC)     Current Outpatient Medications on File Prior to Visit  Medication Sig Dispense Refill   ALPRAZolam (XANAX) 0.5 MG tablet Take one at the MRI after signing paperwork then may repeat in 30 minutes. 2 tablet 0   amLODipine (NORVASC) 10 MG tablet Take 1 tablet (10 mg total) by mouth once daily. 90 tablet 1   aspirin EC 81 MG tablet Take 1 tablet (81 mg total) by mouth daily. Swallow whole. 100 tablet 12   atorvastatin (LIPITOR) 40 MG tablet Take 1 tablet (40 mg total) by mouth daily. 90 tablet 1   b complex vitamins capsule Take 1 capsule by mouth daily. 100 capsule 3   camphor-menthol (SARNA) lotion Apply topically as needed for itching. 222 mL 0   cetirizine (ZYRTEC) 10 MG tablet Take 1 tablet (10 mg total) by mouth daily. 100 tablet 0   Cholecalciferol (VITAMIN D3) 50 MCG (2000 UT) capsule Take 1 capsule (2,000 Units  total) by mouth daily. 90 capsule 1   docusate sodium (COLACE) 100 MG capsule Take 1 capsule (100 mg total) by mouth 2 (two) times daily. 60 capsule 0   empagliflozin (JARDIANCE) 25 MG TABS tablet Take 1 tablet (25 mg total) by mouth once daily before breakfast. 90 tablet 1   ergocalciferol (VITAMIN D2) 1.25 MG (50000 UT) capsule Take 1 capsule (50,000 Units total) by mouth once a week. 12 capsule 0   losartan (COZAAR) 25 MG tablet Take 1 tablet (25 mg total) by mouth once daily. (Please make overdue appt with Dr. Irish Lack before anymore refills. Thank you 1st attempt) 30 tablet 0   methocarbamol (ROBAXIN) 500 MG tablet Take 1 tablet (500 mg total) by mouth every 6 (six) hours as needed for muscle spasms. 120 tablet 1   nitroGLYCERIN (NITROSTAT) 0.4 MG SL tablet Place 1 tablet (0.4 mg total) under the tongue every 5 (five) minutes as needed for chest pain. 75 tablet 1   pregabalin (LYRICA) 200 MG capsule Take 1 capsule (200 mg total) by mouth 3 (three) times daily. 90 capsule 1   triamcinolone cream (KENALOG) 0.1 % Apply topically 3 (three) times daily. 90 g 0   zolpidem (AMBIEN CR) 12.5 MG CR tablet Take 1 tablet (12.5 mg  total) by mouth at bedtime as needed for sleep. 21 tablet 0   No current facility-administered medications on file prior to visit.    Objective:   Vitals:   07/30/22 0849  BP: (Abnormal) 145/82  Pulse: 79  Resp: 16  SpO2: 96%  Weight: 187 lb 12.8 oz (85.2 kg)  Height: 5\' 6"  (1.676 m)    Comprehensive ROS Pertinent positive and negative noted in HPI   Exam General appearance : Awake, alert, not in any distress. Speech Clear. Not toxic looking HEENT: Atraumatic and Normocephalic, pupils equally reactive to light and accomodation Neck: Supple, no JVD. No cervical lymphadenopathy.  Chest: Good air entry bilaterally, no added sounds  CVS: S1 S2 regular, no murmurs.  Abdomen: Bowel sounds present, Non tender and not distended with no gaurding, rigidity or  rebound. Extremities: B/L Lower Ext shows no edema, both legs are warm to touch Neurology: Awake alert, and oriented X 3,  Non focal Skin: No Rash  Data Review Lab Results  Component Value Date   HGBA1C 5.6 01/25/2022   HGBA1C 7.4 (A) 10/25/2021   HGBA1C 6.0 01/25/2021    Assessment & Plan  Scott Tucker was seen today for diabetes and hypertension.  Diagnoses and all orders for this visit:  Medication refill -     amLODipine (NORVASC) 10 MG tablet; Take 1 tablet (10 mg total) by mouth once daily. -     atorvastatin (LIPITOR) 40 MG tablet; Take 1 tablet (40 mg total) by mouth daily. -     empagliflozin (JARDIANCE) 25 MG TABS tablet; Take 1 tablet (25 mg total) by mouth once daily before breakfast. -     losartan (COZAAR) 25 MG tablet; Take 1 tablet (25 mg total) by mouth once daily. (Please make overdue appt with Dr. Irish Lack before anymore refills. Thank you 1st attempt)  Essential hypertension BP goal - < 130/80 Explained that having normal blood pressure is the goal and medications are helping to get to goal and maintain normal blood pressure. DIET: Limit salt intake, read nutrition labels to check salt content, limit fried and high fatty foods  Avoid using multisymptom OTC cold preparations that generally contain sudafed which can rise BP. Consult with pharmacist on best cold relief products to use for persons with HTN EXERCISE Discussed incorporating exercise such as walking - 30 minutes most days of the week and can do in 10 minute intervals    -     amLODipine (NORVASC) 10 MG tablet; Take 1 tablet (10 mg total) by mouth once daily. -     losartan (COZAAR) 25 MG tablet; Take 1 tablet (25 mg total) by mouth once daily. (Please make overdue appt with Dr. Irish Lack before anymore refills. Thank you 1st attempt) -     Comprehensive metabolic panel -     CBC with Differential/Platelet  Mixed hyperlipidemia  Healthy lifestyle diet of fruits vegetables fish nuts whole grains and low  saturated fat . Foods high in cholesterol or liver, fatty meats,cheese, butter avocados, nuts and seeds, chocolate and fried foods. -     atorvastatin (LIPITOR) 40 MG tablet; Take 1 tablet (40 mg total) by mouth daily. -     Lipid panel  Chronic pain syndrome Meloxicam   Fall, initial encounter -     VITAMIN D 25 Hydroxy (Vit-D Deficiency, Fractures)  Type 2 diabetes mellitus without complication, without long-term current use of insulin (HCC) -     Hemoglobin A1c - educated on lifestyle modifications, including but not limited  to diet choices and adding exercise to daily routine.    Colon cancer screening -     Ambulatory referral to Gastroenterology  Other orders -     Cholecalciferol (VITAMIN D3) 50 MCG (2000 UT) CAPS; Take 1 capsule (2,000 Units total) by mouth daily. -     methocarbamol (ROBAXIN) 500 MG tablet; Take 1 tablet (500 mg total) by mouth every 6 (six) hours as needed for muscle spasms.     Patient have been counseled extensively about nutrition and exercise. Other issues discussed during this visit include: low cholesterol diet, weight control and daily exercise, foot care, annual eye examinations at Ophthalmology, importance of adherence with medications and regular follow-up. We also discussed long term complications of uncontrolled diabetes and hypertension.   No follow-ups on file.  The patient was given clear instructions to go to ER or return to medical center if symptoms don't improve, worsen or new problems develop. The patient verbalized understanding. The patient was told to call to get lab results if they haven't heard anything in the next week.   This note has been created with Surveyor, quantity. Any transcriptional errors are unintentional.   Kerin Perna, NP 07/30/2022, 9:02 AM

## 2022-07-30 NOTE — Patient Instructions (Addendum)
Placed in [facility] [address] [place], [place] [address] PH# [phone]  Sent Referral to [facility]  [place] [address] [place], [place] [address]  Ph# [phone]

## 2022-07-30 NOTE — Addendum Note (Signed)
Addended by: Verdell Carmine on: 07/30/2022 09:34 AM   Modules accepted: Orders

## 2022-07-31 LAB — CBC WITH DIFFERENTIAL/PLATELET
Basophils Absolute: 0.1 10*3/uL (ref 0.0–0.2)
Basos: 1 %
EOS (ABSOLUTE): 0.1 10*3/uL (ref 0.0–0.4)
Eos: 1 %
Hematocrit: 50.5 % (ref 37.5–51.0)
Hemoglobin: 16.6 g/dL (ref 13.0–17.7)
Immature Grans (Abs): 0 10*3/uL (ref 0.0–0.1)
Immature Granulocytes: 0 %
Lymphocytes Absolute: 2.2 10*3/uL (ref 0.7–3.1)
Lymphs: 24 %
MCH: 30.6 pg (ref 26.6–33.0)
MCHC: 32.9 g/dL (ref 31.5–35.7)
MCV: 93 fL (ref 79–97)
Monocytes Absolute: 0.7 10*3/uL (ref 0.1–0.9)
Monocytes: 7 %
Neutrophils Absolute: 6.1 10*3/uL (ref 1.4–7.0)
Neutrophils: 67 %
Platelets: 372 10*3/uL (ref 150–450)
RBC: 5.42 x10E6/uL (ref 4.14–5.80)
RDW: 12.8 % (ref 11.6–15.4)
WBC: 9.2 10*3/uL (ref 3.4–10.8)

## 2022-07-31 LAB — COMPREHENSIVE METABOLIC PANEL
ALT: 20 IU/L (ref 0–44)
AST: 17 IU/L (ref 0–40)
Albumin/Globulin Ratio: 1.7 (ref 1.2–2.2)
Albumin: 4.9 g/dL (ref 3.8–4.9)
Alkaline Phosphatase: 119 IU/L (ref 44–121)
BUN/Creatinine Ratio: 11 (ref 9–20)
BUN: 10 mg/dL (ref 6–24)
Bilirubin Total: 0.8 mg/dL (ref 0.0–1.2)
CO2: 23 mmol/L (ref 20–29)
Calcium: 9.9 mg/dL (ref 8.7–10.2)
Chloride: 101 mmol/L (ref 96–106)
Creatinine, Ser: 0.93 mg/dL (ref 0.76–1.27)
Globulin, Total: 2.9 g/dL (ref 1.5–4.5)
Glucose: 163 mg/dL — ABNORMAL HIGH (ref 70–99)
Potassium: 4 mmol/L (ref 3.5–5.2)
Sodium: 141 mmol/L (ref 134–144)
Total Protein: 7.8 g/dL (ref 6.0–8.5)
eGFR: 96 mL/min/{1.73_m2} (ref >59)

## 2022-07-31 LAB — LIPID PANEL
Chol/HDL Ratio: 2.8 ratio (ref 0.0–5.0)
Cholesterol, Total: 141 mg/dL (ref 100–199)
HDL: 51 mg/dL (ref >39)
LDL Chol Calc (NIH): 73 mg/dL (ref 0–99)
Triglycerides: 93 mg/dL (ref 0–149)
VLDL Cholesterol Cal: 17 mg/dL (ref 5–40)

## 2022-07-31 LAB — MICROALBUMIN / CREATININE URINE RATIO
Creatinine, Urine: 58.7 mg/dL
Microalb/Creat Ratio: <5 mg/g creat (ref 0–29)
Microalbumin, Urine: <3 ug/mL

## 2022-07-31 LAB — HEMOGLOBIN A1C
Est. average glucose Bld gHb Est-mCnc: 126 mg/dL
Hgb A1c MFr Bld: 6 % — ABNORMAL HIGH (ref 4.8–5.6)

## 2022-07-31 LAB — VITAMIN D 25 HYDROXY (VIT D DEFICIENCY, FRACTURES): Vit D, 25-Hydroxy: 48.8 ng/mL (ref 30.0–100.0)

## 2022-08-11 DIAGNOSIS — Z008 Encounter for other general examination: Secondary | ICD-10-CM | POA: Diagnosis not present

## 2022-08-13 ENCOUNTER — Other Ambulatory Visit: Payer: Self-pay | Admitting: Physical Medicine & Rehabilitation

## 2022-08-13 ENCOUNTER — Other Ambulatory Visit: Payer: Self-pay | Admitting: Pharmacist

## 2022-08-13 NOTE — Progress Notes (Signed)
Patient outreached by Toma Aran, PharmD Candidate on 08/13/2022 to discuss hypertension.    Patient has an automated home blood pressure machine. They report that they have not been measuring their blood pressure recently.    Medication review was performed. They are taking medications as prescribed. They report no differences from their prescribed list include.   The following barriers to adherence were noted:  - They do not have cost concerns.  - They do not have transportation concerns.  - They do not need assistance obtaining refills.  - They do occasionally forget to take some of their prescribed medications.  - They do not feel like one/some of their medications make them feel poorly.  - They do not have questions or concerns about their medications.  - They do have follow up scheduled with their primary care provider/cardiologist.   The following interventions were completed:  - Medications were reviewed  - Patient was educated on goal blood pressures and long term health implications of elevated blood pressure  - Patient was educated on proper technique to check home blood pressure and reminded to bring home machine and readings to next provider appointment  - Patient was educated on use of adherence strategies, like a pill box or alarms  - Patient was counseled on lifestyle modifications to improve blood pressure, including decreased intake of sodium and increased physical activity.   The patient has follow up scheduled:  PCP: Juluis Mire, NP    Toma Aran, PharmD Candidate   Catie Hedwig Morton, PharmD, [place], [facility] [phone]

## 2022-08-14 MED ORDER — PREGABALIN 200 MG PO CAPS
200.0000 mg | ORAL_CAPSULE | Freq: Three times a day (TID) | ORAL | 1 refills | Status: DC
  Filled 2022-08-14: qty 90, 30d supply, fill #0
  Filled 2022-09-16: qty 90, 30d supply, fill #1
  Filled 2022-09-16: qty 90, 30d supply, fill #0

## 2022-08-15 ENCOUNTER — Other Ambulatory Visit: Payer: Self-pay

## 2022-08-25 DIAGNOSIS — D72829 Elevated white blood cell count, unspecified: Secondary | ICD-10-CM | POA: Diagnosis not present

## 2022-08-25 DIAGNOSIS — M792 Neuralgia and neuritis, unspecified: Secondary | ICD-10-CM | POA: Diagnosis not present

## 2022-08-25 DIAGNOSIS — M48061 Spinal stenosis, lumbar region without neurogenic claudication: Secondary | ICD-10-CM | POA: Diagnosis not present

## 2022-09-04 ENCOUNTER — Other Ambulatory Visit: Payer: Medicare Other

## 2022-09-04 NOTE — Progress Notes (Signed)
09/04/2022 Name: Scott Tucker MRN: [id number] DOB: 1966/01/07  Chief Complaint  Patient presents with   Medication Management    Hypertension    Scott Tucker is a 57 y.o. year old male who presented for a telephone visit.   They were referred to the pharmacist by a quality report for assistance in managing hypertension.   Subjective:  Care Team: Primary Care Provider: Grayce Sessions, NP ; Next Scheduled Visit: 01/30/2023  Medication Access/Adherence  Current Pharmacy:  [facility] [address] [place] [address] Phone: [phone] Fax: [phone]  [facility] - [facility] [address] [place] [address] Phone: [phone] Fax: [phone]  [id number] [address] ([place]), [place] - [address] [place] [address] Phone: [phone] Fax: [phone]  [id number] [place], [place] - [address] AT [facility] [address] [facility] [place] [address] Phone: [phone] Fax: [phone]   Patient reports affordability concerns with their medications: No  Patient reports access/transportation concerns to their pharmacy: No  Patient reports adherence concerns with their medications:  No     Hypertension:  Current medications: amlodipine 10 mg daily, losartan 25 mg daily  Medications previously tried: lisinopril-HCTZ (replaced with losartan)  Patient has a validated, automated, upper arm home BP cuff. However he has been in the process of moving and has not yet found his BP cuff.   Patient denies dizziness, lightheadedness, headache, chest pain, shortness of breath   Objective:  Lab Results  Component Value Date   HGBA1C 6.0 (H) 07/30/2022    Lab Results  Component Value Date   CREATININE 0.93 07/30/2022   BUN 10 07/30/2022   NA 141 07/30/2022   K 4.0 07/30/2022   CL  101 07/30/2022   CO2 23 07/30/2022    Lab Results  Component Value Date   CHOL 141 07/30/2022   HDL 51 07/30/2022   LDLCALC 73 07/30/2022   TRIG 93 07/30/2022   CHOLHDL 2.8 07/30/2022    Medications Reviewed Today     Reviewed by Rushie Goltz, RPH (Pharmacist) on 09/04/22 at 1005  Med List Status: <None>   Medication Order Taking? Sig Documenting Provider Last Dose Status Informant  ALPRAZolam (XANAX) 0.5 MG tablet [id number]  Take one at the MRI after signing paperwork then may repeat in 30 minutes. Kerrin Champagne, MD  Active   amLODipine (NORVASC) 10 MG tablet [id number] Yes Take 1 tablet (10 mg total) by mouth once daily. Grayce Sessions, NP Taking Active   aspirin EC 81 MG tablet [id number]  Take 1 tablet (81 mg total) by mouth daily. Swallow whole. Jacquelynn Cree, PA-C  Active   atorvastatin (LIPITOR) 40 MG tablet [id number]  Take 1 tablet (40 mg total) by mouth daily. Grayce Sessions, NP  Active   b complex vitamins capsule [id number]  Take 1 capsule by mouth daily. Kerrin Champagne, MD  Active   camphor-menthol [facility]) lotion [id number]  Apply topically as needed for itching. Setzer, Lynnell Jude, PA-C  Active   cetirizine (ZYRTEC) 10 MG tablet [id number]  Take 1 tablet (10 mg total) by mouth daily. Grayce Sessions, NP  Active   Cholecalciferol (VITAMIN D3) 50 MCG (2000 UT) CAPS [id number]  Take 1 capsule (2,000 Units total) by mouth daily. Grayce Sessions, NP  Active   docusate sodium (  COLACE) 100 MG capsule [id number]  Take 1 capsule (100 mg total) by mouth 2 (two) times daily. Love, Pamela S, PA-C  Active   empagliflozin (JARDIANCE) 25 MG TABS tablet [id number]  Take 1 tablet (25 mg total) by mouth once daily before breakfast. Grayce Sessions, NP  Active   ergocalciferol (VITAMIN D2) 1.25 MG (50000 UT) capsule [id number]  Take 1 capsule (50,000 Units total) by mouth once a week. Grayce Sessions, NP  Active   losartan (COZAAR) 25 MG tablet [id number] Yes Take 1 tablet  (25 mg total) by mouth once daily. (Please make overdue appt with Dr. Eldridge Dace before anymore refills. Thank you 1st attempt) Grayce Sessions, NP Taking Active   methocarbamol (ROBAXIN) 500 MG tablet [id number]  Take 1 tablet (500 mg total) by mouth every 6 (six) hours as needed for muscle spasms. Grayce Sessions, NP  Active   nitroGLYCERIN (NITROSTAT) 0.4 MG SL tablet [id number]  Place 1 tablet (0.4 mg total) under the tongue every 5 (five) minutes as needed for chest pain. Grayce Sessions, NP  Active Self           Med Note Antony Madura, Harlin Rain Dec 13, 2021  4:24 PM)    pregabalin (LYRICA) 200 MG capsule [id number]  Take 1 capsule (200 mg total) by mouth 3 (three) times daily. Fanny Dance, MD  Active   triamcinolone cream (KENALOG) 0.1 % [id number]  Apply topically 3 (three) times daily. Grayce Sessions, NP  Active   zolpidem (AMBIEN CR) 12.5 MG CR tablet [id number]  Take 1 tablet (12.5 mg total) by mouth at bedtime as needed for sleep. Kerrin Champagne, MD  Active               Assessment/Plan:   Hypertension: - Currently slightly above goal based on last clinic BP (145/82). Patient has home BP cuff, but he is in the middle of moving and has not yet unpacked it. Requests to be called on Friday.  - If home BP elevated, may consider increasing losartan. CMET stable 07/2022 (SCr 0.93, K 4.0 March) - Reviewed long term cardiovascular and renal outcomes of uncontrolled blood pressure - Reviewed appropriate blood pressure monitoring technique and reviewed goal blood pressure. Recommended to check home blood pressure and heart rate daily  Follow Up Plan:  Pharmacist: 2 days PCP: September 2024  Valeda Malm, Ilda Basset.D. PGY-2 Ambulatory Care Pharmacy Resident

## 2022-09-10 DIAGNOSIS — Z008 Encounter for other general examination: Secondary | ICD-10-CM | POA: Diagnosis not present

## 2022-09-16 ENCOUNTER — Other Ambulatory Visit: Payer: Self-pay

## 2022-09-16 ENCOUNTER — Other Ambulatory Visit (HOSPITAL_COMMUNITY): Payer: Self-pay

## 2022-09-16 ENCOUNTER — Other Ambulatory Visit (INDEPENDENT_AMBULATORY_CARE_PROVIDER_SITE_OTHER): Payer: Self-pay | Admitting: Primary Care

## 2022-09-16 DIAGNOSIS — Z760 Encounter for issue of repeat prescription: Secondary | ICD-10-CM

## 2022-09-17 ENCOUNTER — Other Ambulatory Visit: Payer: Self-pay

## 2022-09-24 DIAGNOSIS — D72829 Elevated white blood cell count, unspecified: Secondary | ICD-10-CM | POA: Diagnosis not present

## 2022-09-24 DIAGNOSIS — M48061 Spinal stenosis, lumbar region without neurogenic claudication: Secondary | ICD-10-CM | POA: Diagnosis not present

## 2022-09-24 DIAGNOSIS — M792 Neuralgia and neuritis, unspecified: Secondary | ICD-10-CM | POA: Diagnosis not present

## 2022-09-25 DIAGNOSIS — F1220 Cannabis dependence, uncomplicated: Secondary | ICD-10-CM | POA: Diagnosis not present

## 2022-10-11 DIAGNOSIS — Z008 Encounter for other general examination: Secondary | ICD-10-CM | POA: Diagnosis not present

## 2022-10-25 DIAGNOSIS — D72829 Elevated white blood cell count, unspecified: Secondary | ICD-10-CM | POA: Diagnosis not present

## 2022-10-25 DIAGNOSIS — M48061 Spinal stenosis, lumbar region without neurogenic claudication: Secondary | ICD-10-CM | POA: Diagnosis not present

## 2022-10-25 DIAGNOSIS — M792 Neuralgia and neuritis, unspecified: Secondary | ICD-10-CM | POA: Diagnosis not present

## 2022-10-31 ENCOUNTER — Other Ambulatory Visit: Payer: Self-pay | Admitting: Primary Care

## 2022-10-31 ENCOUNTER — Other Ambulatory Visit: Payer: Self-pay

## 2022-10-31 ENCOUNTER — Other Ambulatory Visit (HOSPITAL_COMMUNITY): Payer: Self-pay

## 2022-10-31 ENCOUNTER — Other Ambulatory Visit: Payer: Self-pay | Admitting: Physical Medicine & Rehabilitation

## 2022-10-31 MED ORDER — PREGABALIN 200 MG PO CAPS
200.0000 mg | ORAL_CAPSULE | Freq: Three times a day (TID) | ORAL | 2 refills | Status: DC
  Filled 2022-10-31: qty 90, 30d supply, fill #0
  Filled 2022-12-26: qty 90, 30d supply, fill #1
  Filled 2023-03-13: qty 90, 30d supply, fill #2

## 2022-11-01 ENCOUNTER — Other Ambulatory Visit: Payer: Self-pay

## 2022-11-10 DIAGNOSIS — Z008 Encounter for other general examination: Secondary | ICD-10-CM | POA: Diagnosis not present

## 2022-12-11 DIAGNOSIS — Z008 Encounter for other general examination: Secondary | ICD-10-CM | POA: Diagnosis not present

## 2022-12-26 ENCOUNTER — Other Ambulatory Visit (HOSPITAL_COMMUNITY): Payer: Self-pay

## 2022-12-26 ENCOUNTER — Other Ambulatory Visit: Payer: Self-pay | Admitting: Primary Care

## 2022-12-26 ENCOUNTER — Other Ambulatory Visit (INDEPENDENT_AMBULATORY_CARE_PROVIDER_SITE_OTHER): Payer: Self-pay | Admitting: Primary Care

## 2022-12-26 ENCOUNTER — Other Ambulatory Visit: Payer: Self-pay

## 2022-12-26 DIAGNOSIS — Z760 Encounter for issue of repeat prescription: Secondary | ICD-10-CM

## 2022-12-26 DIAGNOSIS — I10 Essential (primary) hypertension: Secondary | ICD-10-CM

## 2022-12-26 MED ORDER — TRIAMCINOLONE ACETONIDE 0.1 % EX CREA
TOPICAL_CREAM | Freq: Three times a day (TID) | CUTANEOUS | 0 refills | Status: DC
  Filled 2022-12-26: qty 90, 30d supply, fill #0

## 2022-12-26 MED ORDER — AMLODIPINE BESYLATE 10 MG PO TABS
10.0000 mg | ORAL_TABLET | Freq: Every day | ORAL | 1 refills | Status: DC
  Filled 2022-12-26: qty 90, 90d supply, fill #0

## 2023-01-11 DIAGNOSIS — Z008 Encounter for other general examination: Secondary | ICD-10-CM | POA: Diagnosis not present

## 2023-01-26 ENCOUNTER — Other Ambulatory Visit (INDEPENDENT_AMBULATORY_CARE_PROVIDER_SITE_OTHER): Payer: Self-pay | Admitting: Primary Care

## 2023-01-26 ENCOUNTER — Other Ambulatory Visit (HOSPITAL_COMMUNITY): Payer: Self-pay

## 2023-01-26 DIAGNOSIS — I10 Essential (primary) hypertension: Secondary | ICD-10-CM

## 2023-01-26 DIAGNOSIS — Z760 Encounter for issue of repeat prescription: Secondary | ICD-10-CM

## 2023-01-27 ENCOUNTER — Other Ambulatory Visit: Payer: Self-pay

## 2023-01-27 ENCOUNTER — Other Ambulatory Visit (INDEPENDENT_AMBULATORY_CARE_PROVIDER_SITE_OTHER): Payer: Self-pay

## 2023-01-27 DIAGNOSIS — Z760 Encounter for issue of repeat prescription: Secondary | ICD-10-CM

## 2023-01-27 MED ORDER — EMPAGLIFLOZIN 25 MG PO TABS
25.0000 mg | ORAL_TABLET | Freq: Every day | ORAL | 1 refills | Status: DC
  Filled 2023-01-27 – 2023-04-08 (×3): qty 90, 90d supply, fill #0
  Filled 2023-07-17: qty 90, 90d supply, fill #1

## 2023-01-28 ENCOUNTER — Other Ambulatory Visit: Payer: Self-pay

## 2023-01-30 ENCOUNTER — Ambulatory Visit (INDEPENDENT_AMBULATORY_CARE_PROVIDER_SITE_OTHER): Payer: Medicare Other | Admitting: Primary Care

## 2023-02-07 ENCOUNTER — Ambulatory Visit (INDEPENDENT_AMBULATORY_CARE_PROVIDER_SITE_OTHER): Payer: Medicare Other

## 2023-02-10 DIAGNOSIS — Z008 Encounter for other general examination: Secondary | ICD-10-CM | POA: Diagnosis not present

## 2023-02-11 ENCOUNTER — Ambulatory Visit (INDEPENDENT_AMBULATORY_CARE_PROVIDER_SITE_OTHER): Payer: Medicare Other | Admitting: Primary Care

## 2023-02-11 ENCOUNTER — Other Ambulatory Visit (INDEPENDENT_AMBULATORY_CARE_PROVIDER_SITE_OTHER): Payer: Self-pay | Admitting: Primary Care

## 2023-02-11 ENCOUNTER — Other Ambulatory Visit (HOSPITAL_COMMUNITY): Payer: Self-pay

## 2023-02-11 ENCOUNTER — Encounter (INDEPENDENT_AMBULATORY_CARE_PROVIDER_SITE_OTHER): Payer: Self-pay | Admitting: Primary Care

## 2023-02-11 VITALS — BP 148/84 | HR 85 | Resp 16 | Ht 66.0 in | Wt 202.0 lb

## 2023-02-11 DIAGNOSIS — I10 Essential (primary) hypertension: Secondary | ICD-10-CM

## 2023-02-11 DIAGNOSIS — I2510 Atherosclerotic heart disease of native coronary artery without angina pectoris: Secondary | ICD-10-CM

## 2023-02-11 DIAGNOSIS — E782 Mixed hyperlipidemia: Secondary | ICD-10-CM

## 2023-02-11 DIAGNOSIS — E119 Type 2 diabetes mellitus without complications: Secondary | ICD-10-CM | POA: Diagnosis not present

## 2023-02-11 DIAGNOSIS — Z760 Encounter for issue of repeat prescription: Secondary | ICD-10-CM

## 2023-02-11 DIAGNOSIS — Z7984 Long term (current) use of oral hypoglycemic drugs: Secondary | ICD-10-CM

## 2023-02-11 DIAGNOSIS — Z2821 Immunization not carried out because of patient refusal: Secondary | ICD-10-CM

## 2023-02-11 LAB — POCT GLYCOSYLATED HEMOGLOBIN (HGB A1C): HbA1c, POC (controlled diabetic range): 6 % (ref 0.0–7.0)

## 2023-02-11 MED ORDER — CETIRIZINE HCL 10 MG PO TABS
10.0000 mg | ORAL_TABLET | Freq: Every day | ORAL | 0 refills | Status: DC
Start: 2023-02-11 — End: 2023-07-24
  Filled 2023-02-11: qty 100, 100d supply, fill #0

## 2023-02-11 NOTE — Progress Notes (Signed)
[facility]  Scott Tucker, is a 57 y.o. male  [id number]  [id number]  DOB - May 04, 1966  Chief Complaint  Patient presents with   Hypertension   Diabetes   Hyperlipidemia       Subjective:   Scott Tucker is a 56 y.o. male here today for a follow up visit for management of HTN. Patient has No headache, No chest pain, No abdominal pain - No Nausea, No new weakness tingling or numbness, No Cough - shortness of breath. Diabetes -Denies polyuria, polydipsia, polyphasia or vision changes.  Does not check blood sugars at home. And needing labs  No problems updated.  Allergies  Allergen Reactions   Latex Rash    Past Medical History:  Diagnosis Date   Anginal pain (HCC)    Anxiety    Arthritis    Chronic back pain    Coronary artery disease    Diabetes mellitus without complication (HCC)    type 2   Dyspnea    Headache    HTN (hypertension)    Neuromuscular disorder (HCC)     Current Outpatient Medications on File Prior to Visit  Medication Sig Dispense Refill   ALPRAZolam (XANAX) 0.5 MG tablet Take one at the MRI after signing paperwork then may repeat in 30 minutes. 2 tablet 0   amLODipine (NORVASC) 10 MG tablet Take 1 tablet (10 mg total) by mouth once daily. 90 tablet 1   aspirin EC 81 MG tablet Take 1 tablet (81 mg total) by mouth daily. Swallow whole. 100 tablet 12   atorvastatin (LIPITOR) 40 MG tablet Take 1 tablet (40 mg total) by mouth daily. 90 tablet 1   b complex vitamins capsule Take 1 capsule by mouth daily. 100 capsule 3   camphor-menthol (SARNA) lotion Apply topically as needed for itching. 222 mL 0   cetirizine (ZYRTEC) 10 MG tablet Take 1 tablet (10 mg total) by mouth daily. 100 tablet 0   Cholecalciferol (VITAMIN D3) 50 MCG (2000 UT) capsule Take 1 capsule (2,000 Units total) by mouth daily. 90 capsule 1   docusate sodium (COLACE) 100 MG capsule Take 1 capsule (100 mg total) by mouth 2 (two) times daily. 60 capsule 0    empagliflozin (JARDIANCE) 25 MG TABS tablet Take 1 tablet (25 mg total) by mouth once daily before breakfast. 90 tablet 1   ergocalciferol (VITAMIN D2) 1.25 MG (50000 UT) capsule Take 1 capsule (50,000 Units total) by mouth once a week. 12 capsule 0   losartan (COZAAR) 25 MG tablet Take 1 tablet (25 mg total) by mouth once daily. (Please make overdue appt with Dr. Eldridge Dace before anymore refills. Thank you 1st attempt) 90 tablet 1   methocarbamol (ROBAXIN) 500 MG tablet Take 1 tablet (500 mg total) by mouth every 6 (six) hours as needed for muscle spasms. 120 tablet 1   nitroGLYCERIN (NITROSTAT) 0.4 MG SL tablet Place 1 tablet (0.4 mg total) under the tongue every 5 (five) minutes as needed for chest pain. 75 tablet 1   pregabalin (LYRICA) 200 MG capsule Take 1 capsule (200 mg total) by mouth 3 (three) times daily. 90 capsule 2   triamcinolone cream (KENALOG) 0.1 % Apply topically 3 (three) times daily. 90 g 0   zolpidem (AMBIEN CR) 12.5 MG CR tablet Take 1 tablet (12.5 mg total) by mouth at bedtime as needed for sleep. 21 tablet 0   No current facility-administered medications on file prior to visit.    Objective:   Vitals:  02/11/23 1002 02/11/23 1003  BP: (!) 142/84 (!) 148/84  Pulse: 85   Resp: 16   SpO2: 96%   Weight: 202 lb (91.6 kg)   Height: 5\' 6"  (1.676 m)     Comprehensive ROS Pertinent positive and negative noted in HPI   Exam General appearance : Awake, alert, not in any distress. Speech Clear. Not toxic looking HEENT: Atraumatic and Normocephalic, pupils equally reactive to light and accomodation Neck: Supple, no JVD. No cervical lymphadenopathy.  Chest: Good air entry bilaterally, no added sounds  CVS: S1 S2 regular, no murmurs.  Abdomen: Bowel sounds present, Non tender and not distended with no gaurding, rigidity or rebound. Extremities: B/L Lower Ext shows no edema, both legs are warm to touch Neurology: Awake alert, and oriented X 3, CN II-XII intact, Non  focal Skin: No Rash  Data Review Lab Results  Component Value Date   HGBA1C 6.0 02/11/2023   HGBA1C 6.0 (H) 07/30/2022   HGBA1C 5.6 01/25/2022    Assessment & Plan   1. Essential hypertension Slightly elevated needs f/u with [facility]- referred made BP goal - < 130/80 Explained that having normal blood pressure is the goal and medications are helping to get to goal and maintain normal blood pressure. DIET: Limit salt intake, read nutrition labels to check salt content, limit fried and high fatty foods  Avoid using multisymptom OTC cold preparations that generally contain sudafed which can rise BP. Consult with pharmacist on best cold relief products to use for persons with HTN EXERCISE Discussed incorporating exercise such as walking - 30 minutes most days of the week and can do in 10 minute intervals    - CBC with Differential/Platelet - CMP14+EGFR  2. Mixed hyperlipidemia  Healthy lifestyle diet of fruits vegetables fish nuts whole grains and low saturated fat . Foods high in cholesterol or liver, fatty meats,cheese, butter avocados, nuts and seeds, chocolate and fried foods. - Lipid panel  3. Type 2 diabetes mellitus without complication, without long-term current use of insulin (HCC) Well controlled 6.0  You are prediabetic .  Due to being on medication.Prediabetes is 5.7-6.4 monitor carbohydrates -rice, potatoes, tortillas, breads, pasta, sweets, sodas.  Increase exercising to help maintain appropriate weight.  - CMP14+EGFR - POCT glycosylated hemoglobin (Hb A1C)  4. Influenza vaccination declined   Patient have been counseled extensively about nutrition and exercise. Other issues discussed during this visit include: low cholesterol diet, weight control and daily exercise, foot care, annual eye examinations at Ophthalmology, importance of adherence with medications and regular follow-up. We also discussed long term complications of uncontrolled diabetes  and hypertension.   No follow-ups on file.  The patient was given clear instructions to go to ER or return to medical center if symptoms don't improve, worsen or new problems develop. The patient verbalized understanding. The patient was told to call to get lab results if they haven't heard anything in the next week.   This note has been created with Education officer, environmental. Any transcriptional errors are unintentional.   Grayce Sessions, NP 02/11/2023, 10:41 AM

## 2023-02-12 ENCOUNTER — Other Ambulatory Visit: Payer: Self-pay

## 2023-02-12 ENCOUNTER — Other Ambulatory Visit (HOSPITAL_COMMUNITY): Payer: Self-pay

## 2023-02-12 LAB — CBC WITH DIFFERENTIAL/PLATELET
Basophils Absolute: 0 10*3/uL (ref 0.0–0.2)
Basos: 0 %
EOS (ABSOLUTE): 0.1 10*3/uL (ref 0.0–0.4)
Eos: 1 %
Hematocrit: 46.9 % (ref 37.5–51.0)
Hemoglobin: 15.3 g/dL (ref 13.0–17.7)
Immature Grans (Abs): 0 10*3/uL (ref 0.0–0.1)
Immature Granulocytes: 0 %
Lymphocytes Absolute: 2.4 10*3/uL (ref 0.7–3.1)
Lymphs: 25 %
MCH: 31.4 pg (ref 26.6–33.0)
MCHC: 32.6 g/dL (ref 31.5–35.7)
MCV: 96 fL (ref 79–97)
Monocytes Absolute: 0.8 10*3/uL (ref 0.1–0.9)
Monocytes: 8 %
Neutrophils Absolute: 6.5 10*3/uL (ref 1.4–7.0)
Neutrophils: 66 %
Platelets: 342 10*3/uL (ref 150–450)
RBC: 4.87 x10E6/uL (ref 4.14–5.80)
RDW: 12.7 % (ref 11.6–15.4)
WBC: 9.9 10*3/uL (ref 3.4–10.8)

## 2023-02-12 LAB — CMP14+EGFR
ALT: 23 [IU]/L (ref 0–44)
AST: 16 [IU]/L (ref 0–40)
Albumin: 4.9 g/dL (ref 3.8–4.9)
Alkaline Phosphatase: 116 [IU]/L (ref 44–121)
BUN/Creatinine Ratio: 15 (ref 9–20)
BUN: 11 mg/dL (ref 6–24)
Bilirubin Total: 0.5 mg/dL (ref 0.0–1.2)
CO2: 23 mmol/L (ref 20–29)
Calcium: 10.3 mg/dL — ABNORMAL HIGH (ref 8.7–10.2)
Chloride: 105 mmol/L (ref 96–106)
Creatinine, Ser: 0.73 mg/dL — ABNORMAL LOW (ref 0.76–1.27)
Globulin, Total: 2.6 g/dL (ref 1.5–4.5)
Glucose: 117 mg/dL — ABNORMAL HIGH (ref 70–99)
Potassium: 4.3 mmol/L (ref 3.5–5.2)
Sodium: 145 mmol/L — ABNORMAL HIGH (ref 134–144)
Total Protein: 7.5 g/dL (ref 6.0–8.5)
eGFR: 106 mL/min/{1.73_m2} (ref >59)

## 2023-02-12 LAB — LIPID PANEL
Chol/HDL Ratio: 2.5 {ratio} (ref 0.0–5.0)
Cholesterol, Total: 146 mg/dL (ref 100–199)
HDL: 58 mg/dL (ref >39)
LDL Chol Calc (NIH): 73 mg/dL (ref 0–99)
Triglycerides: 80 mg/dL (ref 0–149)
VLDL Cholesterol Cal: 15 mg/dL (ref 5–40)

## 2023-02-12 MED ORDER — LOSARTAN POTASSIUM 25 MG PO TABS
25.0000 mg | ORAL_TABLET | Freq: Every day | ORAL | 0 refills | Status: DC
  Filled 2023-02-12: qty 90, 90d supply, fill #0

## 2023-02-12 MED ORDER — ATORVASTATIN CALCIUM 40 MG PO TABS
40.0000 mg | ORAL_TABLET | Freq: Every day | ORAL | 0 refills | Status: DC
Start: 2023-02-12 — End: 2023-03-24
  Filled 2023-02-12: qty 90, 90d supply, fill #0

## 2023-02-12 NOTE — Telephone Encounter (Signed)
Requested Prescriptions  Pending Prescriptions Disp Refills   atorvastatin (LIPITOR) 40 MG tablet 90 tablet 0    Sig: Take 1 tablet (40 mg total) by mouth daily.     Cardiovascular:  Antilipid - Statins Failed - 02/11/2023  8:58 PM      Failed - Lipid Panel in normal range within the last 12 months    Cholesterol, Total  Date Value Ref Range Status  02/11/2023 146 100 - 199 mg/dL Final   LDL Chol Calc (NIH)  Date Value Ref Range Status  02/11/2023 73 0 - 99 mg/dL Final   HDL  Date Value Ref Range Status  02/11/2023 58 >39 mg/dL Final   Triglycerides  Date Value Ref Range Status  02/11/2023 80 0 - 149 mg/dL Final         Passed - Patient is not pregnant      Passed - Valid encounter within last 12 months    Recent Outpatient Visits           Yesterday Essential hypertension   [place] [facility] Grayce Sessions, NP   6 months ago Medication refill   [place] [facility] Grayce Sessions, NP   1 year ago Mixed hyperlipidemia   [place] [facility] Grayce Sessions, NP   1 year ago Diabetes mellitus without complication [facility])   [place] [facility] Grayce Sessions, NP   1 year ago Preseptal cellulitis of right eye   [place] [facility], Cari S, PA-C               losartan (COZAAR) 25 MG tablet 90 tablet 0    Sig: Take 1 tablet (25 mg total) by mouth once daily. (Please make overdue appt with Dr. Eldridge Dace before anymore refills. Thank you 1st attempt)     Cardiovascular:  Angiotensin Receptor Blockers Failed - 02/11/2023  8:58 PM      Failed - Cr in normal range and within 180 days    Creatinine, Ser  Date Value Ref Range Status  02/11/2023 0.73 (L) 0.76 - 1.27 mg/dL Final         Failed - K in normal range and within 180 days    Potassium  Date Value Ref Range Status  02/11/2023 4.3 3.5 - 5.2 mmol/L Final         Failed - Last BP in  normal range    BP Readings from Last 1 Encounters:  02/11/23 (!) 148/84         Passed - Patient is not pregnant      Passed - Valid encounter within last 6 months    Recent Outpatient Visits           Yesterday Essential hypertension   [place] [facility] Grayce Sessions, NP   6 months ago Medication refill   [place] [facility] Grayce Sessions, NP   1 year ago Mixed hyperlipidemia   [place] [facility] Grayce Sessions, NP   1 year ago Diabetes mellitus without complication [facility])   [place] [facility] Grayce Sessions, NP   1 year ago Preseptal cellulitis of right eye   [facility], [place], [place]

## 2023-03-10 ENCOUNTER — Telehealth: Payer: Self-pay | Admitting: *Deleted

## 2023-03-10 ENCOUNTER — Other Ambulatory Visit: Payer: Self-pay

## 2023-03-10 DIAGNOSIS — Z1211 Encounter for screening for malignant neoplasm of colon: Secondary | ICD-10-CM

## 2023-03-10 NOTE — Telephone Encounter (Signed)
Colon has been set up for 04/08/23 at [facility]

## 2023-03-10 NOTE — Telephone Encounter (Signed)
Patient made aware of apt changes

## 2023-03-10 NOTE — Telephone Encounter (Signed)
Dr. Russella Dar,   Pt scheduled for direct colon. Hx of difficult intubation. OV or direct to the hospital? Please advise.   Thank you, PV

## 2023-03-10 NOTE — Telephone Encounter (Signed)
Patty,   Please see attached. Pts PV is 10/31 are you able to arrange getting him scheduled over the hospital if not I will call the patient and have him cancelled until something opens up.  Thank you

## 2023-03-10 NOTE — Telephone Encounter (Signed)
You are welcome

## 2023-03-10 NOTE — Telephone Encounter (Signed)
Direct hospital colonoscopy.

## 2023-03-10 NOTE — Telephone Encounter (Signed)
Yesi,   This pt is a documented difficult intubation and his procedure will need to be done at the hospital.   Thanks,  Ronnette Rump 

## 2023-03-10 NOTE — Telephone Encounter (Signed)
Thanks Patty, Do you not what time? Its not showing on my end as of yet

## 2023-03-10 NOTE — Telephone Encounter (Signed)
Nevermind I have the time thanks again

## 2023-03-13 ENCOUNTER — Other Ambulatory Visit: Payer: Self-pay | Admitting: Gastroenterology

## 2023-03-13 ENCOUNTER — Other Ambulatory Visit: Payer: Self-pay

## 2023-03-13 ENCOUNTER — Ambulatory Visit: Payer: Medicare Other

## 2023-03-13 ENCOUNTER — Other Ambulatory Visit (HOSPITAL_COMMUNITY): Payer: Self-pay

## 2023-03-13 VITALS — Ht 66.0 in | Wt 205.0 lb

## 2023-03-13 DIAGNOSIS — Z008 Encounter for other general examination: Secondary | ICD-10-CM | POA: Diagnosis not present

## 2023-03-13 DIAGNOSIS — Z1211 Encounter for screening for malignant neoplasm of colon: Secondary | ICD-10-CM

## 2023-03-13 MED ORDER — PEG 3350-KCL-NA BICARB-NACL 420 G PO SOLR
4000.0000 mL | Freq: Once | ORAL | 0 refills | Status: AC
  Filled 2023-03-13: qty 4000, 1d supply, fill #0

## 2023-03-13 NOTE — Progress Notes (Addendum)
No egg or soy allergy known to patient  No issues known to pt with past sedation with any surgeries or procedures Hx of difficult intubation  No FH of Malignant Hyperthermia Pt is not on diet pills Pt is not on  home 02  Pt is not on blood thinners  Pt denies issues with constipation  No A fib or A flutter Have any cardiac testing pending--no upcoming annual physical exam  LOA: independent  Prep: golytely     PV competed with patient. Prep instructions sent via mychart and home address.

## 2023-03-14 ENCOUNTER — Other Ambulatory Visit (HOSPITAL_COMMUNITY): Payer: Self-pay

## 2023-03-14 ENCOUNTER — Other Ambulatory Visit: Payer: Self-pay

## 2023-03-24 ENCOUNTER — Ambulatory Visit: Payer: Medicare Other | Attending: Physician Assistant | Admitting: Physician Assistant

## 2023-03-24 ENCOUNTER — Encounter: Payer: Self-pay | Admitting: Physician Assistant

## 2023-03-24 ENCOUNTER — Other Ambulatory Visit (HOSPITAL_COMMUNITY): Payer: Self-pay

## 2023-03-24 VITALS — BP 132/80 | HR 80 | Ht 66.0 in | Wt 201.8 lb

## 2023-03-24 DIAGNOSIS — I10 Essential (primary) hypertension: Secondary | ICD-10-CM

## 2023-03-24 DIAGNOSIS — Z760 Encounter for issue of repeat prescription: Secondary | ICD-10-CM

## 2023-03-24 DIAGNOSIS — I2510 Atherosclerotic heart disease of native coronary artery without angina pectoris: Secondary | ICD-10-CM

## 2023-03-24 DIAGNOSIS — E782 Mixed hyperlipidemia: Secondary | ICD-10-CM

## 2023-03-24 DIAGNOSIS — E785 Hyperlipidemia, unspecified: Secondary | ICD-10-CM | POA: Diagnosis not present

## 2023-03-24 MED ORDER — ATORVASTATIN CALCIUM 40 MG PO TABS
40.0000 mg | ORAL_TABLET | Freq: Every day | ORAL | 3 refills | Status: DC
  Filled 2023-03-24 – 2023-04-30 (×2): qty 90, 90d supply, fill #0

## 2023-03-24 MED ORDER — AMLODIPINE BESYLATE 10 MG PO TABS
10.0000 mg | ORAL_TABLET | Freq: Every day | ORAL | 3 refills | Status: DC
  Filled 2023-03-24 – 2023-03-25 (×2): qty 90, 90d supply, fill #0
  Filled 2023-07-17: qty 90, 90d supply, fill #1

## 2023-03-24 MED ORDER — LOSARTAN POTASSIUM 25 MG PO TABS
25.0000 mg | ORAL_TABLET | Freq: Every day | ORAL | 3 refills | Status: DC
  Filled 2023-03-24 – 2023-05-22 (×2): qty 90, 90d supply, fill #0

## 2023-03-24 NOTE — Progress Notes (Signed)
Cardiology Office Note:  .   Date:  03/24/2023  ID:  Scott Tucker, DOB 04-23-1966, MRN [id number] PCP: Grayce Sessions, NP  [facility] Providers Cardiologist:  Lance Muss, MD { History of Present Illness: .   Scott Tucker is a 57 y.o. male With a past medical history of CAD, hypertension, hyperlipidemia, diabetes, chronic back pain here for follow-up appointment.  History of angioplasty in [place] around 2010.  Admitted to [facility] October 2020 with EKG changes including T wave inversions in V5 and V6.  Had a coronary CTA showing obstructive CAD and a small OM vessel.  Positive FFR in the vessel.  Medical management given the size of the vessel.  Troponins negative at that time.  Intolerant to ACE inhibitors due to cough.  Was seen in the office 11/30/2020 by Dr. Eldridge Dace  at which time he was cleared for neck surgery and advised to follow-up in a year.  Had a virtual appointment for a preoperative cardiac evaluation for lumbar fusion 10/24/2021.  He was cleared to proceed with surgery without further testing.  He was last seen July 2023 and lumbar fusion was scheduled for July 25th and he continues to be limited by back pain and uses a walking stick.  Tries to walk 3 times a day to keep his A1c down.  Additionally has some cervical spine pain.  Reports he was doing well from a cardiovascular perspective at that time.   Today, he presents with a history of coronary artery disease, diabetes, and multiple back surgeries, with ongoing falls due to nerve weakness and pain. He reports that the falls are not mechanical in nature, but rather due to ongoing nerve issues and weakness. He has had four back surgeries, but the issue has not been resolved and he has been dealing with this since 1992. He uses a walking stick for support and tries to walk a mile or two on the treadmill every morning to manage his blood sugar levels.  He also reports severe headaches, which he believes  are due to issues with his spine. He has had surgery to insert a plate and screws, which has improved his range of motion, but has also led to severe headaches. He reports that the headaches are so severe that he often has difficulty sleeping, averaging only three to four hours of sleep per night. He has tried various remedies for the pain, including pain medications and marijuana, but has not found a solution that works for him.  Recommended trying Skelaxin for his muscle relaxer since Robaxin does not work for him. He is going to discuss with his PCP.  ROS: Pertinent ROS in HPI  Studies Reviewed: Marland Kitchen   EKG Interpretation Date/Time:  Monday March 24 2023 13:51:00 EST Ventricular Rate:  80 PR Interval:  158 QRS Duration:  120 QT Interval:  374 QTC Calculation: 431 R Axis:   -55  Text Interpretation: Normal sinus rhythm Left anterior fascicular block Left ventricular hypertrophy with QRS widening ( R in aVL , Cornell product , Romhilt-Estes ) Cannot rule out Septal infarct (cited on or before 30-Nov-2020) When compared with ECG of 28-Nov-2021 08:40, T wave inversion less evident in Inferior leads T wave inversion no longer evident in Lateral leads Confirmed by Jari Favre [phone]) on 03/24/2023 2:02:43 PM    Echo 02/22/19   1. Left ventricular ejection fraction, by visual estimation, is 55 to  60%. The left ventricle has normal function. Normal left ventricular size.  There  is mildly increased left ventricular hypertrophy.   2. Global right ventricle has normal systolic function.The right  ventricular size is normal.   3. Left atrial size was normal.   4. Right atrial size was normal.   5. The mitral valve is normal in structure. Trace mitral valve  regurgitation. No evidence of mitral stenosis.   6. The tricuspid valve is normal in structure. Tricuspid valve  regurgitation is trivial.   7. The aortic valve is tricuspid Aortic valve regurgitation was not  visualized by color flow  Doppler. Structurally normal aortic valve, with  no evidence of sclerosis or stenosis.   8. The pulmonic valve was normal in structure. Pulmonic valve  regurgitation is not visualized by color flow Doppler.   9. The inferior vena cava is normal in size with greater than 50%  respiratory variability, suggesting right atrial pressure of 3 mmHg.  10. Normal LV systolic and diastolic function.    CCTA 03/03/19   1. Left Main:  No significant stenosis. FFR = 1.00   2. LAD: No significant stenosis. Proximal FFR = 0.96, Mid FFR =0.95, Distal FFR = 0.87. 3. LCX: No significant stenosis. Proximal FFR = 0.94, Distal FFR = 0.83. There is a stenosis in OM1 with FFR 0.72. 4. RCA: No significant stenosis. Proximal FFR = 0.99, Mid FFR =0.97, Distal FFR = 0.95.   IMPRESSION: 1. CT FFR analysis shows significant stenosis in OM1 vessel with FFR 0.72. This is a small caliber vessel, recommend medical management but consider PCI if there are refractory symptoms.      Physical Exam:   VS:  BP 132/80   Pulse 80   Ht 5\' 6"  (1.676 m)   Wt 201 lb 12.8 oz (91.5 kg)   SpO2 98%   BMI 32.57 kg/m    Wt Readings from Last 3 Encounters:  03/24/23 201 lb 12.8 oz (91.5 kg)  03/13/23 205 lb (93 kg)  02/11/23 202 lb (91.6 kg)    GEN: Well nourished, well developed in no acute distress NECK: No JVD; No carotid bruits CARDIAC: RRR, no murmurs, rubs, gallops RESPIRATORY:  Clear to auscultation without rales, wheezing or rhonchi  ABDOMEN: Soft, non-tender, non-distended EXTREMITIES:  No edema; No deformity   ASSESSMENT AND PLAN: .   CAD without angina -Continue current medication regimen including amlodipine 10 mg daily, aspirin 81 mg daily, Lipitor 40 mg daily, Jardiance 25 mg daily, Cozaar 25 mg daily, nitro as needed  Chronic Back Pain and Neuropathy/Headaches Persistent pain and weakness leading to falls. History of multiple back surgeries. Currently on Lyrica 200mg  TID (max dose) and Robaxin (not  effective). Discussed potential alternatives for pain management including Skelaxin. -Consider discussing with primary care provider about switching from Robaxin to Skelaxin for better muscle relaxation and pain control.  Hypertension Left ventricular hypertrophy noted on EKG, likely secondary to long-standing hypertension. Blood pressure controlled at current visit (132/80). -Continue current antihypertensive regimen. Monitor blood pressure at home and report if readings start to increase.  Hyperlipidemia LDL slightly above goal at 73. Currently on Atorvastatin 40mg  in the morning. -Consider taking Atorvastatin at bedtime for better absorption and potential improved LDL control.  Insomnia Difficulty sleeping, possibly related to chronic pain. History of using Ambien, currently not on any sleep aids. Discussed potential alternatives including Trazodone and Melatonin. -Discuss with primary care provider about potential sleep aids to improve sleep quality.  Diabetes A1c controlled at 6.0. Regular exercise regimen in place. -Continue current diabetes management plan. Maintain regular exercise  regimen.       Dispo: He can follow-up in a year with Dr. Odis Hollingshead or myself  Signed, Sharlene Dory, PA-C

## 2023-03-24 NOTE — Patient Instructions (Signed)
Follow-Up: At [facility], you and your health needs are our priority.  As part of our continuing mission to provide you with exceptional heart care, we have created designated Provider Care Teams.  These Care Teams include your primary Cardiologist (physician) and Advanced Practice Providers (APPs -  Physician Assistants and Nurse Practitioners) who all work together to provide you with the care you need, when you need it.  Your next appointment:   1 year(s)  Provider:   Tessa Lerner, MD

## 2023-03-25 ENCOUNTER — Other Ambulatory Visit (HOSPITAL_COMMUNITY): Payer: Self-pay

## 2023-03-26 ENCOUNTER — Encounter: Payer: Self-pay | Admitting: Gastroenterology

## 2023-03-31 ENCOUNTER — Encounter: Payer: Medicare Other | Admitting: Gastroenterology

## 2023-04-01 ENCOUNTER — Telehealth: Payer: Self-pay | Admitting: Gastroenterology

## 2023-04-01 ENCOUNTER — Telehealth: Payer: Self-pay | Admitting: *Deleted

## 2023-04-01 ENCOUNTER — Encounter (HOSPITAL_COMMUNITY): Payer: Self-pay | Admitting: Gastroenterology

## 2023-04-01 NOTE — Telephone Encounter (Signed)
Dr Ardell Isaacs RN notified of pt's request

## 2023-04-01 NOTE — Telephone Encounter (Signed)
Inbound call from patient stating he needs to reschedule hospital procedure. Please advise.

## 2023-04-01 NOTE — Telephone Encounter (Signed)
Left message on machine to call back  

## 2023-04-01 NOTE — Telephone Encounter (Signed)
Thank yo ufor th update of the RN change and for helping in this matter. Both have a great day! Chip Boer

## 2023-04-01 NOTE — Telephone Encounter (Signed)
Dr Anselm Jungling RN notified of pt's request for her to help facilitate this for this patient

## 2023-04-01 NOTE — Telephone Encounter (Signed)
The pt returned call and states that he would like to cancel the procedure with Dr Russella Dar - he will be out of town. He states he is aware that Dr Russella Dar is retiring and will review the providers available and call back when he is ready to reschedule.

## 2023-04-02 ENCOUNTER — Other Ambulatory Visit (HOSPITAL_COMMUNITY): Payer: Self-pay

## 2023-04-07 ENCOUNTER — Encounter (INDEPENDENT_AMBULATORY_CARE_PROVIDER_SITE_OTHER): Payer: Medicare Other

## 2023-04-08 ENCOUNTER — Encounter (HOSPITAL_COMMUNITY): Admission: RE | Payer: Self-pay | Source: Ambulatory Visit

## 2023-04-08 ENCOUNTER — Other Ambulatory Visit: Payer: Self-pay

## 2023-04-08 ENCOUNTER — Ambulatory Visit (HOSPITAL_COMMUNITY): Admission: RE | Admit: 2023-04-08 | Payer: Medicare Other | Source: Ambulatory Visit | Admitting: Gastroenterology

## 2023-04-08 SURGERY — COLONOSCOPY WITH PROPOFOL
Anesthesia: Monitor Anesthesia Care

## 2023-04-12 DIAGNOSIS — Z008 Encounter for other general examination: Secondary | ICD-10-CM | POA: Diagnosis not present

## 2023-04-14 ENCOUNTER — Encounter (INDEPENDENT_AMBULATORY_CARE_PROVIDER_SITE_OTHER): Payer: Self-pay

## 2023-04-14 ENCOUNTER — Encounter (INDEPENDENT_AMBULATORY_CARE_PROVIDER_SITE_OTHER): Payer: Medicare Other

## 2023-04-30 ENCOUNTER — Other Ambulatory Visit: Payer: Self-pay

## 2023-04-30 ENCOUNTER — Other Ambulatory Visit (HOSPITAL_COMMUNITY): Payer: Self-pay

## 2023-04-30 ENCOUNTER — Other Ambulatory Visit: Payer: Self-pay | Admitting: Physical Medicine & Rehabilitation

## 2023-05-13 DIAGNOSIS — Z008 Encounter for other general examination: Secondary | ICD-10-CM | POA: Diagnosis not present

## 2023-05-18 ENCOUNTER — Other Ambulatory Visit: Payer: Self-pay | Admitting: Physical Medicine & Rehabilitation

## 2023-05-22 ENCOUNTER — Other Ambulatory Visit: Payer: Self-pay | Admitting: Physical Medicine & Rehabilitation

## 2023-05-23 ENCOUNTER — Other Ambulatory Visit (HOSPITAL_COMMUNITY): Payer: Self-pay

## 2023-05-23 ENCOUNTER — Other Ambulatory Visit: Payer: Self-pay

## 2023-05-30 ENCOUNTER — Other Ambulatory Visit: Payer: Self-pay | Admitting: Physical Medicine & Rehabilitation

## 2023-06-11 ENCOUNTER — Encounter (INDEPENDENT_AMBULATORY_CARE_PROVIDER_SITE_OTHER): Payer: Self-pay

## 2023-06-11 ENCOUNTER — Ambulatory Visit (INDEPENDENT_AMBULATORY_CARE_PROVIDER_SITE_OTHER): Payer: Medicare Other | Admitting: *Deleted

## 2023-06-11 DIAGNOSIS — Z0000 Encounter for general adult medical examination without abnormal findings: Secondary | ICD-10-CM

## 2023-06-11 NOTE — Patient Instructions (Signed)
Scott Tucker , Thank you for taking time to come for your Medicare Wellness Visit. I appreciate your ongoing commitment to your health goals. Please review the following plan we discussed and let me know if I can assist you in the future.   Screening recommendations/referrals: Colonoscopy: Education provided Recommended yearly ophthalmology/optometry visit for glaucoma screening and checkup Recommended yearly dental visit for hygiene and checkup  Vaccinations: Influenza vaccine: Education provided Pneumococcal vaccine: Education provided Tdap vaccine: up to date Shingles vaccine: Education provided    Advanced directives: up to date  Conditions/risks identified: up to date    Preventive Care 40-64 Years, Male Preventive care refers to lifestyle choices and visits with your health care provider that can promote health and wellness. What does preventive care include? A yearly physical exam. This is also called an annual well check. Dental exams once or twice a year. Routine eye exams. Ask your health care provider how often you should have your eyes checked. Personal lifestyle choices, including: Daily care of your teeth and gums. Regular physical activity. Eating a healthy diet. Avoiding tobacco and drug use. Limiting alcohol use. Practicing safe sex. Taking low-dose aspirin every day starting at age 63. What happens during an annual well check? The services and screenings done by your health care provider during your annual well check will depend on your age, overall health, lifestyle risk factors, and family history of disease. Counseling  Your health care provider may ask you questions about your: Alcohol use. Tobacco use. Drug use. Emotional well-being. Home and relationship well-being. Sexual activity. Eating habits. Work and work Astronomer. Screening  You may have the following tests or measurements: Height, weight, and BMI. Blood pressure. Lipid and  cholesterol levels. These may be checked every 5 years, or more frequently if you are over 81 years old. Skin check. Lung cancer screening. You may have this screening every year starting at age 64 if you have a 30-pack-year history of smoking and currently smoke or have quit within the past 15 years. Fecal occult blood test (FOBT) of the stool. You may have this test every year starting at age 70. Flexible sigmoidoscopy or colonoscopy. You may have a sigmoidoscopy every 5 years or a colonoscopy every 10 years starting at age 28. Prostate cancer screening. Recommendations will vary depending on your family history and other risks. Hepatitis C blood test. Hepatitis B blood test. Sexually transmitted disease (STD) testing. Diabetes screening. This is done by checking your blood sugar (glucose) after you have not eaten for a while (fasting). You may have this done every 1-3 years. Discuss your test results, treatment options, and if necessary, the need for more tests with your health care provider. Vaccines  Your health care provider may recommend certain vaccines, such as: Influenza vaccine. This is recommended every year. Tetanus, diphtheria, and acellular pertussis (Tdap, Td) vaccine. You may need a Td booster every 10 years. Zoster vaccine. You may need this after age 41. Pneumococcal 13-valent conjugate (PCV13) vaccine. You may need this if you have certain conditions and have not been vaccinated. Pneumococcal polysaccharide (PPSV23) vaccine. You may need one or two doses if you smoke cigarettes or if you have certain conditions. Talk to your health care provider about which screenings and vaccines you need and how often you need them. This information is not intended to replace advice given to you by your health care provider. Make sure you discuss any questions you have with your health care provider. Document Released: 05/26/2015 Document Revised:  01/17/2016 Document Reviewed:  02/28/2015 Elsevier Interactive Patient Education  2017 [employer].  Fall Prevention in the Home Falls can cause injuries. They can happen to people of all ages. There are many things you can do to make your home safe and to help prevent falls. What can I do on the outside of my home? Regularly fix the edges of walkways and driveways and fix any cracks. Remove anything that might make you trip as you walk through a door, such as a raised step or threshold. Trim any bushes or trees on the path to your home. Use bright outdoor lighting. Clear any walking paths of anything that might make someone trip, such as rocks or tools. Regularly check to see if handrails are loose or broken. Make sure that both sides of any steps have handrails. Any raised decks and porches should have guardrails on the edges. Have any leaves, snow, or ice cleared regularly. Use sand or salt on walking paths during winter. Clean up any spills in your garage right away. This includes oil or grease spills. What can I do in the bathroom? Use night lights. Install grab bars by the toilet and in the tub and shower. Do not use towel bars as grab bars. Use non-skid mats or decals in the tub or shower. If you need to sit down in the shower, use a plastic, non-slip stool. Keep the floor dry. Clean up any water that spills on the floor as soon as it happens. Remove soap buildup in the tub or shower regularly. Attach bath mats securely with double-sided non-slip rug tape. Do not have throw rugs and other things on the floor that can make you trip. What can I do in the bedroom? Use night lights. Make sure that you have a light by your bed that is easy to reach. Do not use any sheets or blankets that are too big for your bed. They should not hang down onto the floor. Have a firm chair that has side arms. You can use this for support while you get dressed. Do not have throw rugs and other things on the floor that can make you  trip. What can I do in the kitchen? Clean up any spills right away. Avoid walking on wet floors. Keep items that you use a lot in easy-to-reach places. If you need to reach something above you, use a strong step stool that has a grab bar. Keep electrical cords out of the way. Do not use floor polish or wax that makes floors slippery. If you must use wax, use non-skid floor wax. Do not have throw rugs and other things on the floor that can make you trip. What can I do with my stairs? Do not leave any items on the stairs. Make sure that there are handrails on both sides of the stairs and use them. Fix handrails that are broken or loose. Make sure that handrails are as long as the stairways. Check any carpeting to make sure that it is firmly attached to the stairs. Fix any carpet that is loose or worn. Avoid having throw rugs at the top or bottom of the stairs. If you do have throw rugs, attach them to the floor with carpet tape. Make sure that you have a light switch at the top of the stairs and the bottom of the stairs. If you do not have them, ask someone to add them for you. What else can I do to help prevent falls? Wear shoes  that: Do not have high heels. Have rubber bottoms. Are comfortable and fit you well. Are closed at the toe. Do not wear sandals. If you use a stepladder: Make sure that it is fully opened. Do not climb a closed stepladder. Make sure that both sides of the stepladder are locked into place. Ask someone to hold it for you, if possible. Clearly mark and make sure that you can see: Any grab bars or handrails. First and last steps. Where the edge of each step is. Use tools that help you move around (mobility aids) if they are needed. These include: Canes. Walkers. Scooters. Crutches. Turn on the lights when you go into a dark area. Replace any light bulbs as soon as they burn out. Set up your furniture so you have a clear path. Avoid moving your furniture  around. If any of your floors are uneven, fix them. If there are any pets around you, be aware of where they are. Review your medicines with your doctor. Some medicines can make you feel dizzy. This can increase your chance of falling. Ask your doctor what other things that you can do to help prevent falls. This information is not intended to replace advice given to you by your health care provider. Make sure you discuss any questions you have with your health care provider. Document Released: 02/23/2009 Document Revised: 10/05/2015 Document Reviewed: 06/03/2014 Elsevier Interactive Patient Education  2017 [employer].

## 2023-06-11 NOTE — Progress Notes (Signed)
Subjective:   Scott Tucker is a 58 y.o. male who presents for Medicare Annual/Subsequent preventive examination.  Visit Complete: Virtual I connected with  Scott Tucker on 06/11/23 by a audio enabled telemedicine application and verified that I am speaking with the correct person using two identifiers.  Patient Location: Home  Provider Location: Home Office  I discussed the limitations of evaluation and management by telemedicine. The patient expressed understanding and agreed to proceed.  Vital Signs: Because this visit was a virtual/telehealth visit, some criteria may be missing or patient reported. Any vitals not documented were not able to be obtained and vitals that have been documented are patient reported.   Cardiac Risk Factors include: advanced age (>40men, >67 women);diabetes mellitus;male gender;family history of premature cardiovascular disease;smoking/ tobacco exposure     Objective:    Today's Vitals   06/11/23 0818  PainSc: 10-Worst pain ever   There is no height or weight on file to calculate BMI.     06/11/2023    8:46 AM 04/09/2022    3:59 PM 03/05/2022   12:20 PM 12/13/2021    3:16 PM 12/13/2021    3:00 PM 12/04/2021    6:12 AM 11/28/2021    8:18 AM  Advanced Directives  Does Patient Have a Medical Advance Directive? No No No  No No No  Would patient like information on creating a medical advance directive? No - Patient declined No - Patient declined No - Patient declined No - Patient declined  No - Patient declined Yes (MAU/Ambulatory/Procedural Areas - Information given)    Current Medications (verified) Outpatient Encounter Medications as of 06/11/2023  Medication Sig   amLODipine (NORVASC) 10 MG tablet Take 1 tablet (10 mg total) by mouth once daily.   aspirin EC 81 MG tablet Take 1 tablet (81 mg total) by mouth daily. Swallow whole.   atorvastatin (LIPITOR) 40 MG tablet Take 1 tablet (40 mg total) by mouth daily.   b complex vitamins capsule Take  1 capsule by mouth daily.   Cholecalciferol (VITAMIN D3) 50 MCG (2000 UT) capsule Take 1 capsule (2,000 Units total) by mouth daily.   empagliflozin (JARDIANCE) 25 MG TABS tablet Take 1 tablet (25 mg total) by mouth once daily before breakfast.   losartan (COZAAR) 25 MG tablet Take 1 tablet (25 mg total) by mouth once daily.   methocarbamol (ROBAXIN) 500 MG tablet Take 1 tablet (500 mg total) by mouth every 6 (six) hours as needed for muscle spasms.   nitroGLYCERIN (NITROSTAT) 0.4 MG SL tablet Place 1 tablet (0.4 mg total) under the tongue every 5 (five) minutes as needed for chest pain.   pregabalin (LYRICA) 200 MG capsule Take 1 capsule (200 mg total) by mouth 3 (three) times daily.   triamcinolone cream (KENALOG) 0.1 % Apply topically 3 (three) times daily.   cetirizine (ZYRTEC) 10 MG tablet Take 1 tablet (10 mg total) by mouth daily.   zolpidem (AMBIEN CR) 12.5 MG CR tablet Take 1 tablet (12.5 mg total) by mouth at bedtime as needed for sleep. (Patient not taking: Reported on 06/11/2023)   No facility-administered encounter medications on file as of 06/11/2023.    Allergies (verified) Latex   History: Past Medical History:  Diagnosis Date   Anginal pain (HCC)    Anxiety    Arthritis    Chronic back pain    Coronary artery disease    Diabetes mellitus without complication (HCC)    type 2   Dyspnea  Headache    HTN (hypertension)    Neuromuscular disorder [facility])    Past Surgical History:  Procedure Laterality Date   ANTERIOR CERVICAL DECOMP/DISCECTOMY FUSION N/A 12/04/2020   Procedure: ANTERIOR CERVICAL DISCECTOMY FUSION C3-4, C4-5, ALLOGRAFT, PLATE;  Surgeon: Eldred Manges, MD;  Location: [facility] OR;  Service: Orthopedics;  Laterality: N/A;  needs RNFA   BACK SURGERY     1992 and 2003(dr kritzer)   CARDIAC CATHETERIZATION     SHOULDER SURGERY Right    2017   TONSILLECTOMY     removed as a child   Family History  Problem Relation Age of Onset   Heart disease Maternal Uncle     Colon cancer Neg Hx    Colon polyps Neg Hx    Esophageal cancer Neg Hx    Rectal cancer Neg Hx    Stomach cancer Neg Hx    Social History   Socioeconomic History   Marital status: Single    Spouse name: Not on file   Number of children: 2   Years of education: Not on file   Highest education level: Not on file  Occupational History   Not on file  Tobacco Use   Smoking status: Never   Smokeless tobacco: Never  Vaping Use   Vaping status: Never Used  Substance and Sexual Activity   Alcohol use: Yes    Alcohol/week: 3.0 standard drinks of alcohol    Types: 3 Glasses of wine per week   Drug use: Not Currently    Types: Marijuana    Comment: smokes mostly every day   Sexual activity: Not Currently  Other Topics Concern   Not on file  Social History Narrative   Not on file   Social Drivers of Health   Financial Resource Strain: Low Risk  (06/11/2023)   Overall Financial Resource Strain (CARDIA)    Difficulty of Paying Living Expenses: Not hard at all  Food Insecurity: No Food Insecurity (06/11/2023)   Hunger Vital Sign    Worried About Running Out of Food in the Last Year: Never true    Ran Out of Food in the Last Year: Never true  Transportation Needs: Unmet Transportation Needs (06/11/2023)   PRAPARE - Transportation    Lack of Transportation (Medical): Yes    Lack of Transportation (Non-Medical): Yes  Physical Activity: Insufficiently Active (06/11/2023)   Exercise Vital Sign    Days of Exercise per Week: 3 days    Minutes of Exercise per Session: 40 min  Stress: Stress Concern Present (06/11/2023)   [employer] of Occupational Health - Occupational Stress Questionnaire    Feeling of Stress : Rather much  Social Connections: Unknown (06/11/2023)   Social Connection and Isolation Panel [NHANES]    Frequency of Communication with Friends and Family: More than three times a week    Frequency of Social Gatherings with Friends and Family: Once a week    Attends  Religious Services: Never    Database administrator or Organizations: No    Attends Engineer, structural: Never    Marital Status: Not on file    Tobacco Counseling Counseling given: Not Answered   Clinical Intake:  Pre-visit preparation completed: Yes  Pain : 0-10 Pain Score: 10-Worst pain ever Pain Type: Chronic pain Pain Location: Back Pain Descriptors / Indicators: Constant, Burning, Aching, Sharp Pain Onset: More than a month ago Pain Frequency: Constant     Diabetes: Yes CBG done?: No Did pt. bring in CBG monitor  from home?: No  How often do you need to have someone help you when you read instructions, pamphlets, or other written materials from your doctor or pharmacy?: 1 - Never  Interpreter Needed?: No  Information entered by :: Remi Haggard LPN   Activities of Daily Living    06/11/2023    8:34 AM 04/14/2023    8:52 AM  In your present state of health, do you have any difficulty performing the following activities:  Hearing? 0 0  Vision? 0 0  Difficulty concentrating or making decisions? 0 1  Walking or climbing stairs? 1 1  Dressing or bathing? 0 0  Doing errands, shopping? 0 1  Preparing Food and eating ? N N  Using the Toilet? N N  In the past six months, have you accidently leaked urine? N Y  Do you have problems with loss of bowel control? N N  Managing your Medications? N N  Managing your Finances? N N  Housekeeping or managing your Housekeeping? Y N    Patient Care Team: Grayce Sessions, NP as PCP - General (Internal Medicine) Corky Crafts, MD as PCP - Cardiology (Cardiology)  Indicate any recent Medical Services you may have received from other than [facility] providers in the past year (date may be approximate).     Assessment:   This is a routine wellness examination for Asahd.  Hearing/Vision screen Hearing Screening - Comments:: No trouble hearing Vision Screening - Comments:: Not up to date Education provided     Goals Addressed             This Visit's Progress    Patient Stated       Would like to have less pain       Depression Screen    06/11/2023    8:29 AM 02/11/2023   10:01 AM 07/30/2022    8:50 AM 05/16/2022    9:46 AM 04/09/2022    3:57 PM 01/25/2022   10:38 AM 01/01/2022    2:58 PM  PHQ 2/9 Scores  PHQ - 2 Score 6 5  0 4 6 5   PHQ- 9 Score 21 15   15 20 13   Exception Documentation   Patient refusal        Fall Risk    06/11/2023    8:17 AM 04/14/2023    8:52 AM 02/11/2023   10:01 AM 07/30/2022    8:50 AM 05/16/2022    9:46 AM  Fall Risk   Falls in the past year? 1 1 1 1  0  Number falls in past yr: 0 1 1 0   Injury with Fall? 0 0 0 0   Risk for fall due to : Impaired balance/gait;Impaired mobility      Follow up Falls evaluation completed;Education provided;Falls prevention discussed        MEDICARE RISK AT HOME: Medicare Risk at Home Any stairs in or around the home?: Yes If so, are there any without handrails?: No Home free of loose throw rugs in walkways, pet beds, electrical cords, etc?: Yes Adequate lighting in your home to reduce risk of falls?: Yes Life alert?: No Use of a cane, walker or w/c?: Yes Grab bars in the bathroom?: No Shower chair or bench in shower?: No Elevated toilet seat or a handicapped toilet?: No  TIMED UP AND GO:  Was the test performed?  No    Cognitive Function:    04/09/2022    3:59 PM  MMSE - Mini Mental State Exam  Orientation to time 5  Orientation to Place 5  Registration 3  Attention/ Calculation 5  Recall 3  Language- name 2 objects 2  Language- repeat 1  Language- follow 3 step command 3  Language- read & follow direction 1  Write a sentence 1  Copy design 1  Total score 30        06/11/2023    8:32 AM  6CIT Screen  What Year? 0 points  What month? 0 points  What time? 0 points  Count back from 20 0 points  Months in reverse 2 points  Repeat phrase 8 points  Total Score 10 points     Immunizations Immunization History  Administered Date(s) Administered   PFIZER(Purple Top)SARS-COV-2 Vaccination 06/13/2020, 07/05/2020   Tdap 03/16/2019    TDAP status: Up to date  Flu Vaccine status: Due, Education has been provided regarding the importance of this vaccine. Advised may receive this vaccine at local pharmacy or Health Dept. Aware to provide a copy of the vaccination record if obtained from local pharmacy or Health Dept. Verbalized acceptance and understanding.  Pneumococcal vaccine status: Due, Education has been provided regarding the importance of this vaccine. Advised may receive this vaccine at local pharmacy or Health Dept. Aware to provide a copy of the vaccination record if obtained from local pharmacy or Health Dept. Verbalized acceptance and understanding.  Covid-19 vaccine status: Information provided on how to obtain vaccines.   Qualifies for Shingles Vaccine? Yes   Zostavax completed No   Shingrix Completed?: No.    Education has been provided regarding the importance of this vaccine. Patient has been advised to call insurance company to determine out of pocket expense if they have not yet received this vaccine. Advised may also receive vaccine at local pharmacy or Health Dept. Verbalized acceptance and understanding.  Screening Tests Health Maintenance  Topic Date Due   Pneumococcal Vaccine 73-54 Years old (1 of 2 - PCV) Never done   OPHTHALMOLOGY EXAM  Never done   Zoster Vaccines- Shingrix (1 of 2) Never done   Colonoscopy  Never done   COVID-19 Vaccine (3 - Pfizer risk series) 08/02/2020   FOOT EXAM  10/26/2022   INFLUENZA VACCINE  08/11/2023 (Originally 12/12/2022)   Diabetic kidney evaluation - Urine ACR  07/30/2023   HEMOGLOBIN A1C  08/12/2023   Diabetic kidney evaluation - eGFR measurement  02/11/2024   Medicare Annual Wellness (AWV)  06/10/2024   DTaP/Tdap/Td (2 - Td or Tdap) 03/15/2029   Hepatitis C Screening  Completed   HIV Screening   Completed   HPV VACCINES  Aged Out    Health Maintenance  Health Maintenance Due  Topic Date Due   Pneumococcal Vaccine 71-73 Years old (1 of 2 - PCV) Never done   OPHTHALMOLOGY EXAM  Never done   Zoster Vaccines- Shingrix (1 of 2) Never done   Colonoscopy  Never done   COVID-19 Vaccine (3 - Pfizer risk series) 08/02/2020   FOOT EXAM  10/26/2022    Colonoscopy   Patient will call to reschedule   Lung Cancer Screening: (Low Dose CT Chest recommended if Age 41-80 years, 20 pack-year currently smoking OR have quit w/in 15years.) does not qualify.   Lung Cancer Screening Referral:   Additional Screening:  Hepatitis C Screening: does not qualify; Completed 2021  Vision Screening: Recommended annual ophthalmology exams for early detection of glaucoma and other disorders of the eye. Is the patient up to date with their annual eye exam?  No  Who is the provider or what is the name of the office in which the patient attends annual eye exams? Patient was given Education on were to go for eye exam If pt is not established with a provider, would they like to be referred to a provider to establish care? No .   Dental Screening: Recommended annual dental exams for proper oral hygiene  Nutrition Risk Assessment:  Has the patient had any N/V/D within the last 2 months?  No  Does the patient have any non-healing wounds?  No  Has the patient had any unintentional weight loss or weight gain?  No   Diabetes:  Is the patient diabetic?  Yes  If diabetic, was a CBG obtained today?  No  Did the patient bring in their glucometer from home?  No  How often do you monitor your CBG's? Does not check .   Financial Strains and Diabetes Management:  Are you having any financial strains with the device, your supplies or your medication? No .  Does the patient want to be seen by Chronic Care Management for management of their diabetes?  No  Would the patient like to be referred to a Nutritionist or  for Diabetic Management?  No   Diabetic Exams:  Diabetic Eye Exam: . Overdue for diabetic eye exam. Pt has been advised about the importance in completing this exam  Diabetic Foot Exam: . Pt has been advised about the importance in completing this exam. .    Community Resource Referral / Chronic Care Management: CRR required this visit?  No   CCM required this visit?  No     Plan:     I have personally reviewed and noted the following in the patient's chart:   Medical and social history Use of alcohol, tobacco or illicit drugs  Current medications and supplements including opioid prescriptions. Patient is currently taking opioid prescriptions. Information provided to patient regarding non-opioid alternatives. Patient advised to discuss non-opioid treatment plan with their provider. Functional ability and status Nutritional status Physical activity Advanced directives List of other physicians Hospitalizations, surgeries, and ER visits in previous 12 months Vitals Screenings to include cognitive, depression, and falls Referrals and appointments  In addition, I have reviewed and discussed with patient certain preventive protocols, quality metrics, and best practice recommendations. A written personalized care plan for preventive services as well as general preventive health recommendations were provided to patient.     Remi Haggard, LPN   1/61/0960   After Visit Summary: (MyChart) Due to this being a telephonic visit, the after visit summary with patients personalized plan was offered to patient via MyChart   Nurse Notes:   Patient states he is having trouble with memory, he will make an appointment with Primary to discuss and get a referral for more testing.

## 2023-06-13 DIAGNOSIS — Z008 Encounter for other general examination: Secondary | ICD-10-CM | POA: Diagnosis not present

## 2023-07-11 DIAGNOSIS — Z008 Encounter for other general examination: Secondary | ICD-10-CM | POA: Diagnosis not present

## 2023-07-17 ENCOUNTER — Other Ambulatory Visit (HOSPITAL_COMMUNITY): Payer: Self-pay

## 2023-07-23 ENCOUNTER — Telehealth (INDEPENDENT_AMBULATORY_CARE_PROVIDER_SITE_OTHER): Payer: Self-pay | Admitting: Primary Care

## 2023-07-23 ENCOUNTER — Other Ambulatory Visit (HOSPITAL_COMMUNITY): Payer: Self-pay

## 2023-07-23 NOTE — Telephone Encounter (Signed)
 Spoke to pt about appt.. Will be present

## 2023-07-24 ENCOUNTER — Other Ambulatory Visit: Payer: Self-pay

## 2023-07-24 ENCOUNTER — Other Ambulatory Visit (HOSPITAL_COMMUNITY): Payer: Self-pay

## 2023-07-24 ENCOUNTER — Ambulatory Visit (INDEPENDENT_AMBULATORY_CARE_PROVIDER_SITE_OTHER): Admitting: Primary Care

## 2023-07-24 ENCOUNTER — Encounter (INDEPENDENT_AMBULATORY_CARE_PROVIDER_SITE_OTHER): Payer: Self-pay | Admitting: Primary Care

## 2023-07-24 VITALS — BP 133/86 | HR 99 | Resp 20 | Wt 195.0 lb

## 2023-07-24 DIAGNOSIS — R519 Headache, unspecified: Secondary | ICD-10-CM | POA: Diagnosis not present

## 2023-07-24 DIAGNOSIS — G894 Chronic pain syndrome: Secondary | ICD-10-CM | POA: Diagnosis not present

## 2023-07-24 MED ORDER — PREGABALIN 200 MG PO CAPS
200.0000 mg | ORAL_CAPSULE | Freq: Three times a day (TID) | ORAL | 0 refills | Status: DC
  Filled 2023-07-24: qty 90, 30d supply, fill #0

## 2023-07-24 NOTE — Progress Notes (Signed)
 Body pain  10/10  Would like for you to refill Lyrica.  He is out of this medication.  The prescriber is asking if PCP can refill medication.

## 2023-07-24 NOTE — Progress Notes (Signed)
 [facility]  Scott Tucker, is a 58 y.o. male  [id number]  [id number]  DOB - Oct 09, 1965  Chief Complaint  Patient presents with   Pain       Subjective:   Scott Tucker is a 58 y.o. male here today for an acute visit. Patient is c/o pain from cervical , shoulders radiates to fingers and lumbar region. 10/10 not taking anything. States h/a 4-5 times a week right frontal area behind eye. Requesting a referral . Pt will call back with provider in his insurance net work.   HPI  No problems updated.  Comprehensive ROS Pertinent positive and negative noted in HPI   Allergies  Allergen Reactions   Latex Rash    Past Medical History:  Diagnosis Date   Anginal pain (HCC)    Anxiety    Arthritis    Chronic back pain    Coronary artery disease    Diabetes mellitus without complication (HCC)    type 2   Dyspnea    Headache    HTN (hypertension)    Neuromuscular disorder (HCC)     Current Outpatient Medications on File Prior to Visit  Medication Sig Dispense Refill   amLODipine (NORVASC) 10 MG tablet Take 1 tablet (10 mg total) by mouth once daily. 90 tablet 3   aspirin EC 81 MG tablet Take 1 tablet (81 mg total) by mouth daily. Swallow whole. 100 tablet 12   atorvastatin (LIPITOR) 40 MG tablet Take 1 tablet (40 mg total) by mouth daily. 90 tablet 3   Cholecalciferol (VITAMIN D3) 50 MCG (2000 UT) capsule Take 1 capsule (2,000 Units total) by mouth daily. 90 capsule 1   empagliflozin (JARDIANCE) 25 MG TABS tablet Take 1 tablet (25 mg total) by mouth once daily before breakfast. 90 tablet 1   losartan (COZAAR) 25 MG tablet Take 1 tablet (25 mg total) by mouth once daily. 90 tablet 3   nitroGLYCERIN (NITROSTAT) 0.4 MG SL tablet Place 1 tablet (0.4 mg total) under the tongue every 5 (five) minutes as needed for chest pain. 75 tablet 1   No current facility-administered medications on file prior to visit.   Health Maintenance  Topic Date Due    Pneumococcal Vaccination (1 of 2 - PCV) Never done   Eye exam for diabetics  Never done   Zoster (Shingles) Vaccine (1 of 2) Never done   Colon Cancer Screening  Never done   COVID-19 Vaccine (3 - Pfizer risk series) 08/02/2020   Complete foot exam   10/26/2022   Yearly kidney health urinalysis for diabetes  07/30/2023   Flu Shot  08/11/2023*   Hemoglobin A1C  08/12/2023   Yearly kidney function blood test for diabetes  02/11/2024   Medicare Annual Wellness Visit  06/10/2024   DTaP/Tdap/Td vaccine (2 - Td or Tdap) 03/15/2029   Hepatitis C Screening  Completed   HIV Screening  Completed   HPV Vaccine  Aged Out  *Topic was postponed. The date shown is not the original due date.    Objective:   Vitals:   07/24/23 1459  BP: 133/86  Pulse: 99  Resp: 20  SpO2: 97%  Weight: 195 lb (88.5 kg)   BP Readings from Last 3 Encounters:  07/24/23 133/86  03/24/23 132/80  02/11/23 (!) 148/84      Physical Exam Vitals reviewed.  HENT:     Head: Normocephalic.     Right Ear: Tympanic membrane and external ear normal.     Left Ear:  Tympanic membrane and external ear normal.     Nose: Nose normal.  Eyes:     Extraocular Movements: Extraocular movements intact.     Pupils: Pupils are equal, round, and reactive to light.  Cardiovascular:     Rate and Rhythm: Normal rate and regular rhythm.  Pulmonary:     Effort: Pulmonary effort is normal.     Breath sounds: Normal breath sounds.  Abdominal:     General: Bowel sounds are normal. There is distension.     Palpations: Abdomen is soft.  Musculoskeletal:     Cervical back: Rigidity present.     Comments: Right arm losing mobility  lumbar pain  Skin:    General: Skin is warm and dry.  Neurological:     Mental Status: He is oriented to person, place, and time.  Psychiatric:        Mood and Affect: Mood normal.        Behavior: Behavior normal.        Thought Content: Thought content normal.        Judgment: Judgment normal.        Assessment & Plan  [name] was seen today for pain.  Diagnoses and all orders for this visit:  [name] was seen today for pain.  Diagnoses and all orders for this visit:  Chronic pain syndrome -     pregabalin (LYRICA) 200 MG capsule; Take 1 capsule (200 mg total) by mouth 3 (three) times daily.  Chronic nonintractable headache, unspecified headache type -     Ambulatory referral to Neurology    Patient have been counseled extensively about nutrition and exercise. Other issues discussed during this visit include: low cholesterol diet, weight control and daily exercise, foot care, annual eye examinations at Ophthalmology, importance of adherence with medications and regular follow-up. We also discussed long term complications of uncontrolled diabetes and hypertension.   Return for keep scheduled appt.  The patient was given clear instructions to go to ER or return to medical center if symptoms don't improve, worsen or new problems develop. The patient verbalized understanding. The patient was told to call to get lab results if they haven't heard anything in the next week.   This note has been created with Education officer, environmental. Any transcriptional errors are unintentional.   Grayce Sessions, NP 08/02/2023, 7:52 PM

## 2023-07-26 ENCOUNTER — Other Ambulatory Visit (HOSPITAL_COMMUNITY): Payer: Self-pay

## 2023-08-11 DIAGNOSIS — Z008 Encounter for other general examination: Secondary | ICD-10-CM | POA: Diagnosis not present

## 2023-08-18 ENCOUNTER — Telehealth (INDEPENDENT_AMBULATORY_CARE_PROVIDER_SITE_OTHER): Payer: Self-pay | Admitting: Primary Care

## 2023-08-18 NOTE — Telephone Encounter (Signed)
 Called to confirm appt. Pt will be present

## 2023-08-25 ENCOUNTER — Telehealth (INDEPENDENT_AMBULATORY_CARE_PROVIDER_SITE_OTHER): Payer: Self-pay | Admitting: Primary Care

## 2023-08-25 NOTE — Telephone Encounter (Signed)
 Called pt to confirm appt. Pt will be present.

## 2023-08-26 ENCOUNTER — Ambulatory Visit (INDEPENDENT_AMBULATORY_CARE_PROVIDER_SITE_OTHER): Admitting: Primary Care

## 2023-09-08 ENCOUNTER — Telehealth (INDEPENDENT_AMBULATORY_CARE_PROVIDER_SITE_OTHER): Payer: Self-pay | Admitting: Primary Care

## 2023-09-08 NOTE — Telephone Encounter (Signed)
 Called pt to confirm appt. Pt did not answer and LVM

## 2023-09-10 DIAGNOSIS — Z008 Encounter for other general examination: Secondary | ICD-10-CM | POA: Diagnosis not present

## 2023-09-15 ENCOUNTER — Telehealth (INDEPENDENT_AMBULATORY_CARE_PROVIDER_SITE_OTHER): Payer: Self-pay | Admitting: Primary Care

## 2023-09-15 NOTE — Telephone Encounter (Signed)
 Called pt to confirm appt. Pt will be present.

## 2023-09-16 ENCOUNTER — Ambulatory Visit (INDEPENDENT_AMBULATORY_CARE_PROVIDER_SITE_OTHER): Admitting: Primary Care

## 2023-09-16 ENCOUNTER — Encounter (INDEPENDENT_AMBULATORY_CARE_PROVIDER_SITE_OTHER): Payer: Self-pay | Admitting: Primary Care

## 2023-09-16 ENCOUNTER — Other Ambulatory Visit: Payer: Self-pay

## 2023-09-16 ENCOUNTER — Other Ambulatory Visit (HOSPITAL_COMMUNITY): Payer: Self-pay

## 2023-09-16 VITALS — BP 147/87 | HR 96 | Resp 16 | Ht 66.0 in | Wt 196.4 lb

## 2023-09-16 DIAGNOSIS — Z1211 Encounter for screening for malignant neoplasm of colon: Secondary | ICD-10-CM

## 2023-09-16 DIAGNOSIS — Z760 Encounter for issue of repeat prescription: Secondary | ICD-10-CM

## 2023-09-16 DIAGNOSIS — E782 Mixed hyperlipidemia: Secondary | ICD-10-CM

## 2023-09-16 DIAGNOSIS — F4323 Adjustment disorder with mixed anxiety and depressed mood: Secondary | ICD-10-CM

## 2023-09-16 DIAGNOSIS — E119 Type 2 diabetes mellitus without complications: Secondary | ICD-10-CM

## 2023-09-16 DIAGNOSIS — I10 Essential (primary) hypertension: Secondary | ICD-10-CM | POA: Diagnosis not present

## 2023-09-16 DIAGNOSIS — Z7984 Long term (current) use of oral hypoglycemic drugs: Secondary | ICD-10-CM | POA: Diagnosis not present

## 2023-09-16 DIAGNOSIS — G894 Chronic pain syndrome: Secondary | ICD-10-CM

## 2023-09-16 DIAGNOSIS — G4701 Insomnia due to medical condition: Secondary | ICD-10-CM

## 2023-09-16 MED ORDER — QUETIAPINE FUMARATE 25 MG PO TABS
25.0000 mg | ORAL_TABLET | Freq: Every day | ORAL | 0 refills | Status: DC
Start: 2023-09-16 — End: 2024-01-24
  Filled 2023-09-16: qty 90, 90d supply, fill #0

## 2023-09-16 MED ORDER — EMPAGLIFLOZIN 25 MG PO TABS
25.0000 mg | ORAL_TABLET | Freq: Every day | ORAL | 1 refills | Status: DC
  Filled 2023-09-16: qty 90, 90d supply, fill #0

## 2023-09-16 MED ORDER — AMLODIPINE BESYLATE 10 MG PO TABS
10.0000 mg | ORAL_TABLET | Freq: Every day | ORAL | 3 refills | Status: DC
  Filled 2023-09-16: qty 90, 90d supply, fill #0

## 2023-09-16 MED ORDER — NITROGLYCERIN 0.4 MG SL SUBL
0.4000 mg | SUBLINGUAL_TABLET | SUBLINGUAL | 1 refills | Status: DC | PRN
  Filled 2023-09-16: qty 75, 90d supply, fill #0

## 2023-09-16 MED ORDER — LOSARTAN POTASSIUM 25 MG PO TABS
25.0000 mg | ORAL_TABLET | Freq: Every day | ORAL | 3 refills | Status: DC
  Filled 2023-09-16: qty 90, 90d supply, fill #0
  Filled 2023-11-04 – 2023-12-17 (×2): qty 90, 90d supply, fill #1
  Filled 2024-01-27 – 2024-04-13 (×2): qty 90, 90d supply, fill #2

## 2023-09-16 NOTE — Patient Instructions (Signed)
Quetiapine Tablets What is this medication? QUETIAPINE (kwe TYE a peen) treats schizophrenia and bipolar disorder. It works by balancing the levels of dopamine and serotonin in your brain, hormones that help regulate mood, behaviors, and thoughts. It belongs to a group of medications called antipsychotics. Antipsychotic medications can be used to treat several kinds of mental health conditions. This medicine may be used for other purposes; ask your health care provider or pharmacist if you have questions. COMMON BRAND NAME(S): Seroquel What should I tell my care team before I take this medication? They need to know if you have any of these conditions: Blockage in your bowels Cataracts Constipation Dementia Diabetes Difficulty swallowing Glaucoma Heart disease High levels of prolactin History of breast cancer History of irregular heartbeat Liver disease Low blood cell levels (white cells, red cells, and platelets) Low blood pressure Parkinson disease Prostate disease Seizures Suicidal thoughts, plans, or attempt by you or a family member Thyroid disease Trouble passing urine An unusual or allergic reaction to quetiapine, other medications, foods, dyes, or preservatives Pregnant or trying to get pregnant Breastfeeding How should I use this medication? Take this medication by mouth with water. Take it as directed on the prescription label at the same time every day. You can take it with or without food. If it upsets your stomach, take it with food. Keep taking it unless your care team tells you to stop. A special MedGuide will be given to you by the pharmacist with each prescription and refill. Be sure to read this information carefully each time. Talk to your care team about the use of this medication in children. While this medication may be prescribed for children as young as 10 years for selected conditions, precautions do apply. People over 1 years of age may have a stronger  reaction to this medication and need smaller doses. Overdosage: If you think you have taken too much of this medicine contact a poison control center or emergency room at once. NOTE: This medicine is only for you. Do not share this medicine with others. What if I miss a dose? If you miss a dose, take it as soon as you can. If it is almost time for your next dose, take only that dose. Do not take double or extra doses. What may interact with this medication? Do not take this medication with any of the following: Cisapride Dronedarone Metoclopramide Pimozide Thioridazine This medication may also interact with the following: Alcohol Antihistamines for allergy, cough, and cold Atropine Avasimibe Certain antivirals for HIV or hepatitis Certain medications for anxiety or sleep Certain medications for bladder problems, such as oxybutynin, tolterodine Certain medications for depression, such as amitriptyline, fluoxetine, nefazodone, sertraline Certain medications for fungal infections, such as fluconazole, ketoconazole, itraconazole, posaconazole Certain medications for stomach problems, such as dicyclomine, hyoscyamine Certain medications for travel sickness, such as scopolamine Cimetidine General anesthetics, such as halothane, isoflurane, methoxyflurane, propofol Ipratropium Levodopa or other medications for Parkinson disease Medications for blood pressure Medications for seizures Medications that relax muscles for surgery Opioid medications for pain Other medications that cause heart rhythm changes Phenothiazines, such as chlorpromazine, prochlorperazine Rifampin St. John's wort This list may not describe all possible interactions. Give your health care provider a list of all the medicines, herbs, non-prescription drugs, or dietary supplements you use. Also tell them if you smoke, drink alcohol, or use illegal drugs. Some items may interact with your medicine. What should I watch for  while using this medication? Visit your care team for regular  checks on your progress. Tell your care team if your symptoms do not start to get better or if they get worse. Do not suddenly stop taking This medication. You may develop a severe reaction. Your care team will tell you how much medication to take. If your care team wants you to stop the medication, the dose may be slowly lowered over time to avoid any side effects. You may need to have an eye exam before and during use of this medication. This medication may increase blood sugar. Ask your care team if changes in diet or medications are needed if you have diabetes. This medication may cause thoughts of suicide or depression. This includes sudden changes in mood, behaviors, or thoughts. These changes can happen at any time but are more common in the beginning of treatment or after a change in dose. Call your care team right away if you experience these thoughts or worsening depression. This medication may affect your coordination, reaction time, or judgment. Do not drive or operate machinery until you know how this medication affects you. Sit up or stand slowly to reduce the risk of dizzy or fainting spells. Drinking alcohol with this medication can increase the risk of these side effects. This medication can cause problems with controlling your body temperature. It can lower the response of your body to cold temperatures. If possible, stay indoors during cold weather. If you must go outdoors, wear warm clothes. It can also lower the response of your body to heat. Do not overheat. Do not over-exercise. Stay out of the sun when possible. If you must be in the sun, wear cool clothing. Drink plenty of water. If you have trouble controlling your body temperature, call your care team right away. What side effects may I notice from receiving this medication? Side effects that you should report to your care team as soon as possible: Allergic  reactions--skin rash, itching, hives, swelling of the face, lips, tongue, or throat Heart rhythm changes--fast or irregular heartbeat, dizziness, feeling faint or lightheaded, chest pain, trouble breathing High blood sugar (hyperglycemia)--increased thirst or amount of urine, unusual weakness or fatigue, blurry vision High fever, stiff muscles, increased sweating, fast or irregular heartbeat, and confusion, which may be signs of neuroleptic malignant syndrome High prolactin level--unexpected breast tissue growth, discharge from the nipple, change in sex drive or performance, irregular menstrual cycle Increase in blood pressure in children Infection--fever, chills, cough, or sore throat Low blood pressure--dizziness, feeling faint or lightheaded, blurry vision Low thyroid levels (hypothyroidism)--unusual weakness or fatigue, increased sensitivity to cold, constipation, hair loss, dry skin, weight gain, feelings of depression Pain or trouble swallowing Seizures Stroke--sudden numbness or weakness of the face, arm, or leg, trouble speaking, confusion, trouble walking, loss of balance or coordination, dizziness, severe headache, change in vision Sudden eye pain or change in vision such as blurry vision, seeing halos around lights, vision loss Thoughts of suicide or self-harm, worsening mood, feelings of depression Trouble passing urine Uncontrolled and repetitive body movements, muscle stiffness or spasms, tremors or shaking, loss of balance or coordination, restlessness, shuffling walk, which may be signs of extrapyramidal symptoms (EPS) Side effects that usually do not require medical attention (report to your care team if they continue or are bothersome): Constipation Dizziness Drowsiness Dry mouth Weight gain This list may not describe all possible side effects. Call your doctor for medical advice about side effects. You may report side effects to FDA at 1-800-FDA-1088. Where should I keep my  medication? Keep out  of the reach of children. Store at room temperature between 15 and 30 degrees C (59 and 86 degrees F). Throw away any unused medication after the expiration date. NOTE: This sheet is a summary. It may not cover all possible information. If you have questions about this medicine, talk to your doctor, pharmacist, or health care provider.  2024 Elsevier/Gold Standard (2021-11-12 00:00:00)

## 2023-09-16 NOTE — Progress Notes (Signed)
 [facility]  Scott Tucker, is a 58 y.o. male  [id number]  [id number]  DOB - 19-Aug-1965      Subjective:   Scott Tucker is a 58 y.o. male here today for a follow up visit. HTN- Patient has No headache, No chest pain, No abdominal pain - No Nausea, No new weakness tingling or numbness, No Cough - shortness of breath T2D- Denies polyuria, polydipsia, polyphasia or vision changes.  Does not check blood sugars at home.  Patient is voicing concerns and complaints about pain that is radiating from his cervical and thoracic area to his arms.  Also during visit he was unable to keep still and sit for a period of time without experiencing pain.  He has a upcoming appointment with neurology.  Unfortunately the 2 surgeons that he has been to are now retired.  No problems updated.  Comprehensive ROS Pertinent positive and negative noted in HPI   Allergies  Allergen Reactions   Latex Rash    Past Medical History:  Diagnosis Date   Anginal pain (HCC)    Anxiety    Arthritis    Chronic back pain    Coronary artery disease    Diabetes mellitus without complication (HCC)    type 2   Dyspnea    Headache    HTN (hypertension)    Neuromuscular disorder (HCC)     Current Outpatient Medications on File Prior to Visit  Medication Sig Dispense Refill   aspirin  EC 81 MG tablet Take 1 tablet (81 mg total) by mouth daily. Swallow whole. 100 tablet 12   atorvastatin  (LIPITOR) 40 MG tablet Take 1 tablet (40 mg total) by mouth daily. 90 tablet 3   Cholecalciferol  (VITAMIN D3) 50 MCG (2000 UT) capsule Take 1 capsule (2,000 Units total) by mouth daily. 90 capsule 1   No current facility-administered medications on file prior to visit.   Health Maintenance  Topic Date Due   Eye exam for diabetics  Never done   Pneumococcal Vaccination (1 of 2 - PCV) Never done   Zoster (Shingles) Vaccine (1 of 2) Never done   Colon Cancer Screening  Never done   COVID-19 Vaccine (3 -  Pfizer risk series) 08/02/2020   Complete foot exam   10/26/2022   Yearly kidney health urinalysis for diabetes  07/30/2023   Hemoglobin A1C  08/12/2023   Flu Shot  12/12/2023   Yearly kidney function blood test for diabetes  02/11/2024   Medicare Annual Wellness Visit  06/10/2024   DTaP/Tdap/Td vaccine (2 - Td or Tdap) 03/15/2029   Hepatitis C Screening  Completed   HIV Screening  Completed   HPV Vaccine  Aged Out   Meningitis B Vaccine  Aged Out    Objective:    Physical Exam Vitals reviewed.  Constitutional:      Appearance: He is obese.  HENT:     Head: Normocephalic.     Right Ear: Tympanic membrane and external ear normal.     Left Ear: Tympanic membrane and external ear normal.     Nose: Nose normal.  Eyes:     Extraocular Movements: Extraocular movements intact.     Pupils: Pupils are equal, round, and reactive to light.  Cardiovascular:     Rate and Rhythm: Normal rate and regular rhythm.  Pulmonary:     Effort: Pulmonary effort is normal.     Breath sounds: Normal breath sounds.  Abdominal:     General: Bowel sounds are normal. There is  distension.     Palpations: Abdomen is soft.  Musculoskeletal:        General: Normal range of motion.  Lymphadenopathy:     Cervical: Cervical adenopathy present.  Skin:    General: Skin is warm and dry.  Neurological:     Mental Status: He is oriented to person, place, and time.  Psychiatric:        Mood and Affect: Mood normal.        Behavior: Behavior normal.        Thought Content: Thought content normal.        Judgment: Judgment normal.    Assessment & Plan  Diagnoses and all orders for this visit:  Essential hypertension -     CMP14+EGFR -     amLODipine  (NORVASC ) 10 MG tablet; Take 1 tablet (10 mg total) by mouth once daily. -     losartan  (COZAAR ) 25 MG tablet; Take 1 tablet (25 mg total) by mouth once daily.  Mixed hyperlipidemia -     Lipid panel -     Microalbumin / creatinine urine ratio  Type 2  diabetes mellitus without complication, without long-term current use of insulin  (HCC) -     CBC with Differential/Platelet -     Hemoglobin A1c -     Lipid panel -     empagliflozin  (JARDIANCE ) 25 MG TABS tablet; Take 1 tablet (25 mg total) by mouth once daily before breakfast.  Screening for colon cancer -     Cologuard  Medication refill -     amLODipine  (NORVASC ) 10 MG tablet; Take 1 tablet (10 mg total) by mouth once daily. -     empagliflozin  (JARDIANCE ) 25 MG TABS tablet; Take 1 tablet (25 mg total) by mouth once daily before breakfast. -     losartan  (COZAAR ) 25 MG tablet; Take 1 tablet (25 mg total) by mouth once daily. -     nitroGLYCERIN  (NITROSTAT ) 0.4 MG SL tablet; Place 1 tablet (0.4 mg total) under the tongue every 5 (five) minutes as needed for chest pain.  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      Patient have been counseled extensively about nutrition and exercise. Other issues discussed during this visit include: low cholesterol diet, weight control and daily exercise, foot care, annual eye examinations at Ophthalmology, importance of adherence with medications and regular follow-up. We also discussed long term complications of uncontrolled diabetes and hypertension.   Return in about 6 weeks (around 10/28/2023) for medication follow up.  The patient was given clear instructions to go to ER or return to medical center if symptoms don't improve, worsen or new problems develop. The patient verbalized understanding. The patient was told to call to get lab results if they haven't heard anything in the next week.   This note has been created with Education officer, environmental. Any transcriptional  errors are unintentional.   Marius Siemens, NP 09/16/2023, 1:57 PM

## 2023-09-17 LAB — LIPID PANEL
Chol/HDL Ratio: 2.9 ratio (ref 0.0–5.0)
Cholesterol, Total: 158 mg/dL (ref 100–199)
HDL: 55 mg/dL (ref >39)
LDL Chol Calc (NIH): 85 mg/dL (ref 0–99)
Triglycerides: 98 mg/dL (ref 0–149)
VLDL Cholesterol Cal: 18 mg/dL (ref 5–40)

## 2023-09-17 LAB — CBC WITH DIFFERENTIAL/PLATELET
Basophils Absolute: 0 10*3/uL (ref 0.0–0.2)
Basos: 0 %
EOS (ABSOLUTE): 0.1 10*3/uL (ref 0.0–0.4)
Eos: 1 %
Hematocrit: 47.6 % (ref 37.5–51.0)
Hemoglobin: 15.8 g/dL (ref 13.0–17.7)
Immature Grans (Abs): 0 10*3/uL (ref 0.0–0.1)
Immature Granulocytes: 0 %
Lymphocytes Absolute: 2.5 10*3/uL (ref 0.7–3.1)
Lymphs: 26 %
MCH: 31.7 pg (ref 26.6–33.0)
MCHC: 33.2 g/dL (ref 31.5–35.7)
MCV: 96 fL (ref 79–97)
Monocytes Absolute: 0.7 10*3/uL (ref 0.1–0.9)
Monocytes: 7 %
Neutrophils Absolute: 6.4 10*3/uL (ref 1.4–7.0)
Neutrophils: 66 %
Platelets: 358 10*3/uL (ref 150–450)
RBC: 4.98 x10E6/uL (ref 4.14–5.80)
RDW: 12.8 % (ref 11.6–15.4)
WBC: 9.8 10*3/uL (ref 3.4–10.8)

## 2023-09-17 LAB — MICROALBUMIN / CREATININE URINE RATIO
Creatinine, Urine: 54 mg/dL
Microalb/Creat Ratio: <6 mg/g{creat} (ref 0–29)
Microalbumin, Urine: <3 ug/mL

## 2023-09-17 LAB — CMP14+EGFR
ALT: 21 IU/L (ref 0–44)
AST: 14 IU/L (ref 0–40)
Albumin: 5 g/dL — ABNORMAL HIGH (ref 3.8–4.9)
Alkaline Phosphatase: 127 IU/L — ABNORMAL HIGH (ref 44–121)
BUN/Creatinine Ratio: 13 (ref 9–20)
BUN: 10 mg/dL (ref 6–24)
Bilirubin Total: 0.5 mg/dL (ref 0.0–1.2)
CO2: 23 mmol/L (ref 20–29)
Calcium: 10 mg/dL (ref 8.7–10.2)
Chloride: 103 mmol/L (ref 96–106)
Creatinine, Ser: 0.76 mg/dL (ref 0.76–1.27)
Globulin, Total: 2.4 g/dL (ref 1.5–4.5)
Glucose: 104 mg/dL — ABNORMAL HIGH (ref 70–99)
Potassium: 4.6 mmol/L (ref 3.5–5.2)
Sodium: 142 mmol/L (ref 134–144)
Total Protein: 7.4 g/dL (ref 6.0–8.5)
eGFR: 105 mL/min/{1.73_m2} (ref >59)

## 2023-09-17 LAB — HEMOGLOBIN A1C
Est. average glucose Bld gHb Est-mCnc: 146 mg/dL
Hgb A1c MFr Bld: 6.7 % — ABNORMAL HIGH (ref 4.8–5.6)

## 2023-09-19 ENCOUNTER — Telehealth (INDEPENDENT_AMBULATORY_CARE_PROVIDER_SITE_OTHER): Payer: Self-pay | Admitting: Primary Care

## 2023-09-19 ENCOUNTER — Other Ambulatory Visit (INDEPENDENT_AMBULATORY_CARE_PROVIDER_SITE_OTHER): Payer: Self-pay

## 2023-09-19 ENCOUNTER — Other Ambulatory Visit (HOSPITAL_COMMUNITY): Payer: Self-pay

## 2023-09-19 DIAGNOSIS — Z760 Encounter for issue of repeat prescription: Secondary | ICD-10-CM

## 2023-09-19 DIAGNOSIS — I10 Essential (primary) hypertension: Secondary | ICD-10-CM

## 2023-09-19 DIAGNOSIS — E119 Type 2 diabetes mellitus without complications: Secondary | ICD-10-CM

## 2023-09-19 MED ORDER — AMLODIPINE BESYLATE 10 MG PO TABS
10.0000 mg | ORAL_TABLET | Freq: Every day | ORAL | 3 refills | Status: DC
  Filled 2023-09-19 – 2023-11-04 (×3): qty 90, 90d supply, fill #0
  Filled 2023-12-17 – 2024-01-24 (×2): qty 90, 90d supply, fill #1
  Filled 2024-04-13: qty 90, 90d supply, fill #2

## 2023-09-19 MED ORDER — EMPAGLIFLOZIN 25 MG PO TABS
25.0000 mg | ORAL_TABLET | Freq: Every day | ORAL | 1 refills | Status: DC
  Filled 2023-09-19 – 2024-04-13 (×3): qty 90, 90d supply, fill #0

## 2023-09-19 NOTE — Telephone Encounter (Signed)
 Copied from CRM [id number]. Topic: Clinical - Medication Refill >> Sep 19, 2023 10:45 AM Elle L wrote: Medication: Pregabalin  200 MG 3 times a day 90 day supply  Has the patient contacted their pharmacy? Yes  This is the patient's preferred pharmacy:  [facility] [address] [place] [address] Phone: [phone] Fax: [phone]  Is this the correct pharmacy for this prescription? Yes If no, delete pharmacy and type the correct one.   Has the prescription been filled recently? No  Is the patient out of the medication? No, 8 pills left.   Has the patient been seen for an appointment in the last year OR does the patient have an upcoming appointment? Yes  Can we respond through MyChart? Yes  Agent: Please be advised that Rx refills may take up to 3 business days. We ask that you follow-up with your pharmacy.

## 2023-09-19 NOTE — Telephone Encounter (Signed)
 Copied from CRM [id number]. Topic: Clinical - Prescription Issue >> Sep 19, 2023 10:47 AM Elle L wrote: Reason for CRM: The patient states his pharmacy did not receive his empagliflozin  (JARDIANCE ) 25 MG TABS tablet AND amLODipine  (NORVASC ) 10 MG tablet prescriptions.

## 2023-09-19 NOTE — Telephone Encounter (Signed)
 Rx has been sent

## 2023-09-23 ENCOUNTER — Other Ambulatory Visit (HOSPITAL_COMMUNITY): Payer: Self-pay

## 2023-09-23 ENCOUNTER — Ambulatory Visit (INDEPENDENT_AMBULATORY_CARE_PROVIDER_SITE_OTHER): Payer: Self-pay | Admitting: Primary Care

## 2023-09-23 ENCOUNTER — Other Ambulatory Visit: Payer: Self-pay

## 2023-09-24 ENCOUNTER — Telehealth (INDEPENDENT_AMBULATORY_CARE_PROVIDER_SITE_OTHER): Payer: Self-pay | Admitting: Primary Care

## 2023-09-24 NOTE — Telephone Encounter (Unsigned)
 Copied from CRM [id number]. Topic: Clinical - Medication Refill >> Sep 24, 2023 10:20 AM Essie A wrote: Medication: atorvastatin  (LIPITOR) 40 MG tablet; pregabalin  (LYRICA ) capsule 75 mg (patient wants to know if he is still supposed to be on Lyrica .   Has the patient contacted their pharmacy? No because he didn't see one of the prescriptions on the list anymore (Agent: If no, request that the patient contact the pharmacy for the refill. If patient does not wish to contact the pharmacy document the reason why and proceed with request.) (Agent: If yes, when and what did the pharmacy advise?)  This is the patient's preferred pharmacy:  [place] [facility] [address] [place] [address] Phone: [phone] Fax: [phone]  Is this the correct pharmacy for this prescription? Yes If no, delete pharmacy and type the correct one.   Has the prescription been filled recently? No  Is the patient out of the medication? No  Has the patient been seen for an appointment in the last year OR does the patient have an upcoming appointment? Yes  Can we respond through MyChart? Yes  Agent: Please be advised that Rx refills may take up to 3 business days. We ask that you follow-up with your pharmacy.

## 2023-09-25 ENCOUNTER — Other Ambulatory Visit (HOSPITAL_COMMUNITY): Payer: Self-pay

## 2023-09-25 ENCOUNTER — Telehealth: Payer: Self-pay | Admitting: *Deleted

## 2023-09-25 ENCOUNTER — Other Ambulatory Visit: Payer: Self-pay

## 2023-09-25 DIAGNOSIS — E782 Mixed hyperlipidemia: Secondary | ICD-10-CM

## 2023-09-25 DIAGNOSIS — Z760 Encounter for issue of repeat prescription: Secondary | ICD-10-CM

## 2023-09-25 MED ORDER — ATORVASTATIN CALCIUM 40 MG PO TABS
40.0000 mg | ORAL_TABLET | Freq: Every day | ORAL | 3 refills | Status: AC
  Filled 2023-09-25: qty 90, 90d supply, fill #0
  Filled 2023-11-04 – 2023-12-17 (×2): qty 90, 90d supply, fill #1
  Filled 2024-04-13: qty 90, 90d supply, fill #2

## 2023-09-25 NOTE — Telephone Encounter (Signed)
 Gabino Joe PA-C  Zachery Hermes M, LPN Please ask patient if he is having difficulty taking or getting Losartan . Marlyse Single, PA-C   09/25/2023 11:16 AM     Returned a call back to the pt about the above message.   Pt states he is not having any issues accessing and receiving his losartan  refills, but he does need his atorvastatin  refilled, for he is out of this medication.   Confirmed the pharmacy of choice with the pt.  Pt uses [facility] and has his scripts mailed to them.  Sent rx for atorvastatin  40 mg daily to [facility] with note to please mail this refill.  Pt was appreciative for all the assistance provided.

## 2023-10-27 ENCOUNTER — Telehealth (INDEPENDENT_AMBULATORY_CARE_PROVIDER_SITE_OTHER): Payer: Self-pay | Admitting: Primary Care

## 2023-10-27 NOTE — Telephone Encounter (Signed)
 Called pt to confirm appt. Pt said he would like to cancel at this time.

## 2023-10-28 ENCOUNTER — Ambulatory Visit (INDEPENDENT_AMBULATORY_CARE_PROVIDER_SITE_OTHER): Admitting: Primary Care

## 2023-11-04 ENCOUNTER — Other Ambulatory Visit (HOSPITAL_COMMUNITY): Payer: Self-pay

## 2023-11-04 ENCOUNTER — Other Ambulatory Visit: Payer: Self-pay

## 2023-11-24 ENCOUNTER — Ambulatory Visit: Admitting: Diagnostic Neuroimaging

## 2023-11-26 ENCOUNTER — Ambulatory Visit (INDEPENDENT_AMBULATORY_CARE_PROVIDER_SITE_OTHER): Admitting: Neurology

## 2023-11-26 ENCOUNTER — Encounter: Payer: Self-pay | Admitting: Neurology

## 2023-11-26 VITALS — BP 138/90 | HR 66 | Ht 66.0 in | Wt 195.0 lb

## 2023-11-26 DIAGNOSIS — M961 Postlaminectomy syndrome, not elsewhere classified: Secondary | ICD-10-CM | POA: Insufficient documentation

## 2023-11-26 NOTE — Progress Notes (Signed)
 [facility]  Provider:  Dr Aydrian Halpin Referring Provider: Celestia Rosaline SQUIBB, NP Primary Care Physician:  Celestia Rosaline SQUIBB, NP  Chief Complaint  Patient presents with   New Patient (Initial Visit)    RM 1, Pt w/partner. Pt referred here from PCP due to headaches. Pt has had surgery that should help with headaches and may have to go back in. Pt states HA have never stopped and are worsening. HA are constant and if not in his head in his neck. Pt also states he feels like he has sinus buildup when lying down.    HPI:  Scott Tucker is a 58 y.o. male and seen here on 11/26/2023 upon referral from NP. Edwards for a Consultation/ Evaluation of headaches . It turns out to be a chronic pain syndrome.  This patient has undergone multiple surgeries. He had trouble to lift his arm, and was dx with cervical DDD and has undergone anterior cervical fusion- 2022. In 2023 he had other  problems low back- his was the third  surgery to that area. Dr Carles. Dr Lucilla.  He suffers from  DM, HTN>    Here for neurology to be evaluated for Chronic nonintractable headache, unspecified headache type- Ambulatory referral to Neurology. Cervical spine in 2023.;  Unchanged mild spinal stenosis and moderate to severe left neural foraminal stenosis at C5-6 and C6-7.no new images since 2023.      The patient reports that headaches came and go, there has been no neck surgery since 2023 or 2022.  The patient describes at this time headaches that can go and come and go and come episodic.  He also describes that these headaches arise from the same area, the occipital area - and radiating to the retro-orbital area.  3-4 times a week.  10/10 . Lasting  10-15 minutes.  Throbbing behind the eye. , mostly the right.   He keeps his eyes closed -  Usually one side ,  no nausea , some photophobia , some phonophobia.                07-24-2023 : Scott Tucker is a 58 y.o. male here today for an  acute visit. Patient is c/o pain from cervical , shoulders radiates to fingers and lumbar region. 10/10 not taking anything. States h/a 4-5 times a week right frontal area behind eye. Requesting a referral . Pt will call back with provider in his insurance net work.   Review of Systems: Out of a complete 14 system review, the patient complains of only the following symptoms, and all other reviewed systems are negative.     Social History   Socioeconomic History   Marital status: Significant Other    Spouse name: Not on file   Number of children: 2   Years of education: Not on file   Highest education level: Not on file  Occupational History   Not on file  Tobacco Use   Smoking status: Never   Smokeless tobacco: Never  Vaping Use   Vaping status: Never Used  Substance and Sexual Activity   Alcohol use: Yes    Alcohol/week: 6.0 standard drinks of alcohol    Types: 6 Cans of beer per week    Comment: about six pack/week   Drug use: Not Currently    Types: Marijuana    Comment: smokes mostly every day   Sexual activity: Not Currently  Other Topics Concern   Not on file  Social History Narrative  Not on file   Social Drivers of Health   Financial Resource Strain: Low Risk  (06/11/2023)   Overall Financial Resource Strain (CARDIA)    Difficulty of Paying Living Expenses: Not hard at all  Food Insecurity: No Food Insecurity (09/16/2023)   Hunger Vital Sign    Worried About Running Out of Food in the Last Year: Never true    Ran Out of Food in the Last Year: Never true  Recent Concern: Food Insecurity - Food Insecurity Present (09/16/2023)   Hunger Vital Sign    Worried About Running Out of Food in the Last Year: Never true    Ran Out of Food in the Last Year: Sometimes true  Transportation Needs: No Transportation Needs (09/16/2023)   PRAPARE - Administrator, Civil Service (Medical): No    Lack of Transportation (Non-Medical): No  Physical Activity:  Insufficiently Active (06/11/2023)   Exercise Vital Sign    Days of Exercise per Week: 3 days    Minutes of Exercise per Session: 40 min  Stress: Stress Concern Present (06/11/2023)   [employer] of Occupational Health - Occupational Stress Questionnaire    Feeling of Stress : Rather much  Social Connections: Unknown (06/11/2023)   Social Connection and Isolation Panel    Frequency of Communication with Friends and Family: More than three times a week    Frequency of Social Gatherings with Friends and Family: Once a week    Attends Religious Services: Never    Database administrator or Organizations: No    Attends Banker Meetings: Never    Marital Status: Not on file  Intimate Partner Violence: Not At Risk (06/11/2023)   Humiliation, Afraid, Rape, and Kick questionnaire    Fear of Current or Ex-Partner: No    Emotionally Abused: No    Physically Abused: No    Sexually Abused: No    Family History  Problem Relation Age of Onset   Heart disease Maternal Uncle    Colon cancer Neg Hx    Colon polyps Neg Hx    Esophageal cancer Neg Hx    Rectal cancer Neg Hx    Stomach cancer Neg Hx     Past Medical History:  Diagnosis Date   Anginal pain (HCC)    Anxiety    Arthritis    Chronic back pain    Coronary artery disease    Diabetes mellitus without complication (HCC)    type 2   Dyspnea    Headache    HTN (hypertension)    Neuromuscular disorder (HCC)     Past Surgical History:  Procedure Laterality Date   ANTERIOR CERVICAL DECOMP/DISCECTOMY FUSION N/A 12/04/2020   Procedure: ANTERIOR CERVICAL DISCECTOMY FUSION C3-4, C4-5, ALLOGRAFT, PLATE;  Surgeon: Barbarann Oneil BROCKS, MD;  Location: [facility] OR;  Service: Orthopedics;  Laterality: N/A;  needs RNFA   BACK SURGERY     1992 and 2003(dr kritzer)   CARDIAC CATHETERIZATION     SHOULDER SURGERY Right    2017   TONSILLECTOMY     removed as a child    Current Outpatient Medications  Medication Sig Dispense Refill    amLODipine  (NORVASC ) 10 MG tablet Take 1 tablet (10 mg total) by mouth once daily. 90 tablet 3   aspirin  EC 81 MG tablet Take 1 tablet (81 mg total) by mouth daily. Swallow whole. 100 tablet 12   atorvastatin  (LIPITOR) 40 MG tablet Take 1 tablet (40 mg total) by mouth daily. 90  tablet 3   Cholecalciferol  (VITAMIN D3) 50 MCG (2000 UT) capsule Take 1 capsule (2,000 Units total) by mouth daily. 90 capsule 1   empagliflozin  (JARDIANCE ) 25 MG TABS tablet Take 1 tablet (25 mg total) by mouth once daily before breakfast. 90 tablet 1   losartan  (COZAAR ) 25 MG tablet Take 1 tablet (25 mg total) by mouth once daily. 90 tablet 3   nitroGLYCERIN  (NITROSTAT ) 0.4 MG SL tablet Place 1 tablet (0.4 mg total) under the tongue every 5 (five) minutes as needed for chest pain. 75 tablet 1   QUEtiapine  (SEROQUEL ) 25 MG tablet Take 1 tablet (25 mg total) by mouth at bedtime. 90 tablet 0   No current facility-administered medications for this visit.    Allergies as of 11/26/2023 - Review Complete 11/26/2023  Allergen Reaction Noted   Latex Rash 05/17/2020    Vitals: BP (!) 138/90 (BP Location: Right Arm, Patient Position: Sitting, Cuff Size: Normal)   Pulse 66   Ht 5' 6 (1.676 m)   Wt 195 lb (88.5 kg)   BMI 31.47 kg/m   @No  data found.     @VITALSLAST3  [[id number]]@ Physical exam:  General: The patient is awake, alert and appears not in acute distress.  The patient is well groomed. Head: Normocephalic, atraumatic.  Neck :  limited ROM . Most painful is turning to the left.  Neck flexion and retroflexion are painful and radiating into the arm on the right.     Cardiovascular:  Regular rate and palpable peripheral pulse:  Respiratory: clear to auscultation.   Skin:  Without evidence of edema, or rash    Neurologic exam : The patient is awake and alert, oriented to place and time.   Memory subjective  described as intact.  There is a normal attention span & concentration ability.  Speech is  fluent without  dysarthria, dysphonia or aphasia.  Mood and affect are appropriate.  Cranial nerves: Pupils are equal and briskly reactive to light.  Extraocular movements  in vertical and horizontal planes intact and without nystagmus. Visual fields by finger perimetry are intact. Hearing to finger rub intact.  Facial sensation intact to fine touch. Facial motor strength is symmetric and tongue and uvula move midline.  Motor exam:  increased rigor over both arms- limited shoulder ROM, and  inability to relax,  Grip Strength - drops things from both hands.  Proximal strength of shoulder muscles  was reduced and hip flexors was intact.  Deep tendon reflexes: in the  upper and lower extremities are attenuated- he is too tense.    Assessment: Total time for face to face interview and examination, for review of  images and laboratory testing, neurophysiology testing and pre-existing records, including out-of -network , was 45 minutes. Assessment is as follows here:  1)   I am unable to get a consistent history or timeline of events. I understood that the patient has been in chronic pain and was not seen for pain management and has not returned to his  orthopedist (  now retired)  or his group of partners.   2)  chronic pain syndrome , postoperative  reported worsening of pain and headaches  , but remains unclear about triggers, frequency and associated symptoms.  especially related to cervical spine DDD.  MRI cervical spine may be of benefit .   Plan:  the patient reports no benefit from Gabapentin or Lyrica  in the past.  These are the medications we would have offered.    NP  Edwards ; please refer to pain management and to his surgical group for  re- evaluation.   This syndrome exceeds my scope of practice.    No follow-ups on file.    Dedra Gores, MD  [facility] Board certified by The [employer] of Sleep Medicine and Diplomate of the  [employer] of [employer] Medicine. Board certified In Neurology through the ABPN, Fellow of the [employer] of Neu[employer].

## 2023-11-26 NOTE — Patient Instructions (Signed)
 Chronic Pain, Adult Chronic pain is a type of pain that lasts or keeps coming back for at least 3-6 months. You may have headaches, pain in the abdomen, or pain in other areas of the body. Chronic pain may be related to an illness, injury, or a health condition. Sometimes, the cause of chronic pain is not known. Chronic pain can make it hard for you to do daily activities. If it is not treated, chronic pain can lead to anxiety and depression. Treatment depends on the cause of your pain and how severe it is. You may need to work with a pain specialist to come up with a treatment plan. Many people benefit from two or more types of treatment to control their pain. Follow these instructions at home: Treatment plan Follow your treatment plan as told by your health care provider. This may include: Gentle, regular exercise. Eating a healthy diet that includes foods such as vegetables, fruits, fish, and lean meats. Mental health therapy (cognitive or behavioral therapy) that changes the way you think or act in response to the pain. This may help improve how you feel. Doing physical therapy exercises to improve movement and strength. Meditation, yoga, acupuncture, or massage therapy. Using the oils from plants in your environment or on your skin (aromatherapy). Other treatments may include: Over-the-counter or prescription medicines. Color, light, or sound therapy. Local electrical stimulation. The electrical pulses help to relieve pain by temporarily stopping the nerve impulses that cause you to feel pain. Injections. These deliver numbing or pain-relieving medicines into the spine or the area of pain.  Medicines Take over-the-counter and prescription medicines only as told by your health care provider. Ask your health care provider if the medicine prescribed to you: Requires you to avoid driving or using machinery. Can cause constipation. You may need to take these actions to prevent or treat  constipation: Drink enough fluid to keep your urine pale yellow. Take over-the-counter or prescription medicines. Eat foods that are high in fiber, such as beans, whole grains, and fresh fruits and vegetables. Limit foods that are high in fat and processed sugars, such as fried or sweet foods. Lifestyle  Ask your health care provider whether you should keep a pain diary. Your health care provider will tell you what information to write in the diary. This may include: When you have pain. What the pain feels like. How medicines and other behaviors or treatments help to reduce the pain. Consider talking with a mental health care provider about how to help manage chronic pain. Consider joining a chronic pain support group. Try to control or lower your stress levels. Talk with your health care provider about ways to do this. General instructions Learn as much as you can about how to manage your chronic pain. Ask your health care provider if an intensive pain rehabilitation program or a chronic pain specialist would be helpful. Check your pain level as told by your health care provider. Ask your health care provider if you should use a pain scale. Contact a health care provider if: Your pain is not controlled with treatment. You have new pain. You have side effects from pain medicine. You feel weak or you have trouble doing your normal activities. You have trouble sleeping or you develop confusion. You lose feeling or have numbness in your body. You lose control of your bowels or bladder. Get help right away if: Your pain suddenly gets much worse. You develop chest pain. You have trouble breathing or shortness of  breath. You faint, or another person sees you faint. These symptoms may be an emergency. Get help right away. Call 911. Do not wait to see if the symptoms will go away. Do not drive yourself to the hospital. Also, get help right away if: You have thoughts about hurting yourself  or others. Take one of these steps if you feel like you may hurt yourself or others, or have thoughts about taking your own life: Go to your nearest emergency room. Call 911. Call the National Suicide Prevention Lifeline at 801-395-0635 or 988. This is open 24 hours a day. Text the Crisis Text Line at (939)501-7456. This information is not intended to replace advice given to you by your health care provider. Make sure you discuss any questions you have with your health care provider. Document Revised: 12/19/2021 Document Reviewed: 11/21/2021 Elsevier Patient Education  2024 Elsevier Inc.  1)   I am unable to get a consistent history or timeline of events. I understood that the patient has been in chronic pain and was not seen for pain management and has not returned to his  orthopedist (  now retired)  or his group of partners.    2)  chronic pain syndrome , postoperative  reported worsening of pain and headaches  , but remains unclear about triggers, frequency and associated symptoms.  especially related to cervical spine DDD.  MRI cervical spine may be of benefit .     Plan:  the patient reports no benefit from Gabapentin or Lyrica  in the past.  These are the medications we would have offered.     NP Edwards ; please refer to pain management and to his surgical group for  re- evaluation.    This syndrome exceeds my scope of practice.      No follow-ups on file.

## 2023-12-17 ENCOUNTER — Other Ambulatory Visit (HOSPITAL_COMMUNITY): Payer: Self-pay

## 2023-12-17 ENCOUNTER — Other Ambulatory Visit: Payer: Self-pay

## 2024-01-24 ENCOUNTER — Encounter (INDEPENDENT_AMBULATORY_CARE_PROVIDER_SITE_OTHER): Payer: Self-pay | Admitting: Primary Care

## 2024-01-24 ENCOUNTER — Other Ambulatory Visit (INDEPENDENT_AMBULATORY_CARE_PROVIDER_SITE_OTHER): Payer: Self-pay | Admitting: Primary Care

## 2024-01-24 DIAGNOSIS — G4701 Insomnia due to medical condition: Secondary | ICD-10-CM

## 2024-01-24 DIAGNOSIS — K921 Melena: Secondary | ICD-10-CM

## 2024-01-26 ENCOUNTER — Other Ambulatory Visit (HOSPITAL_COMMUNITY): Payer: Self-pay

## 2024-01-27 ENCOUNTER — Other Ambulatory Visit: Payer: Self-pay

## 2024-01-27 ENCOUNTER — Other Ambulatory Visit (HOSPITAL_COMMUNITY): Payer: Self-pay

## 2024-01-27 MED ORDER — QUETIAPINE FUMARATE 25 MG PO TABS
25.0000 mg | ORAL_TABLET | Freq: Every day | ORAL | 0 refills | Status: DC
  Filled 2024-01-27: qty 30, 30d supply, fill #0

## 2024-02-04 ENCOUNTER — Encounter (INDEPENDENT_AMBULATORY_CARE_PROVIDER_SITE_OTHER): Payer: Self-pay | Admitting: Primary Care

## 2024-04-13 ENCOUNTER — Other Ambulatory Visit: Payer: Self-pay

## 2024-04-13 ENCOUNTER — Other Ambulatory Visit (HOSPITAL_COMMUNITY): Payer: Self-pay

## 2024-04-13 ENCOUNTER — Other Ambulatory Visit (INDEPENDENT_AMBULATORY_CARE_PROVIDER_SITE_OTHER): Payer: Self-pay | Admitting: Primary Care

## 2024-04-13 DIAGNOSIS — G4701 Insomnia due to medical condition: Secondary | ICD-10-CM

## 2024-04-13 NOTE — Telephone Encounter (Signed)
 Will forward to provider

## 2024-04-17 MED ORDER — VITAMIN D3 50 MCG (2000 UT) PO CAPS
2000.0000 [IU] | ORAL_CAPSULE | Freq: Every day | ORAL | 1 refills | Status: DC
  Filled 2024-04-17: qty 90, 90d supply, fill #0

## 2024-04-17 MED ORDER — QUETIAPINE FUMARATE 25 MG PO TABS
25.0000 mg | ORAL_TABLET | Freq: Every day | ORAL | 0 refills | Status: DC
  Filled 2024-04-17: qty 30, 30d supply, fill #0

## 2024-04-18 ENCOUNTER — Other Ambulatory Visit (HOSPITAL_COMMUNITY): Payer: Self-pay

## 2024-04-19 ENCOUNTER — Other Ambulatory Visit: Payer: Self-pay

## 2024-05-10 ENCOUNTER — Telehealth (INDEPENDENT_AMBULATORY_CARE_PROVIDER_SITE_OTHER): Payer: Self-pay | Admitting: Primary Care

## 2024-05-10 NOTE — Telephone Encounter (Signed)
 Called pt to confirm appt. Pt will be present.

## 2024-05-11 ENCOUNTER — Ambulatory Visit (INDEPENDENT_AMBULATORY_CARE_PROVIDER_SITE_OTHER): Admitting: Primary Care

## 2024-05-11 ENCOUNTER — Other Ambulatory Visit: Payer: Self-pay

## 2024-05-11 ENCOUNTER — Encounter (INDEPENDENT_AMBULATORY_CARE_PROVIDER_SITE_OTHER): Payer: Self-pay | Admitting: Primary Care

## 2024-05-11 ENCOUNTER — Other Ambulatory Visit (HOSPITAL_COMMUNITY): Payer: Self-pay

## 2024-05-11 VITALS — BP 142/72 | HR 83 | Resp 16 | Wt 196.4 lb

## 2024-05-11 DIAGNOSIS — G4701 Insomnia due to medical condition: Secondary | ICD-10-CM

## 2024-05-11 DIAGNOSIS — E559 Vitamin D deficiency, unspecified: Secondary | ICD-10-CM

## 2024-05-11 DIAGNOSIS — Z1211 Encounter for screening for malignant neoplasm of colon: Secondary | ICD-10-CM

## 2024-05-11 DIAGNOSIS — R351 Nocturia: Secondary | ICD-10-CM | POA: Diagnosis not present

## 2024-05-11 DIAGNOSIS — I10 Essential (primary) hypertension: Secondary | ICD-10-CM

## 2024-05-11 DIAGNOSIS — E782 Mixed hyperlipidemia: Secondary | ICD-10-CM | POA: Diagnosis not present

## 2024-05-11 DIAGNOSIS — Z760 Encounter for issue of repeat prescription: Secondary | ICD-10-CM

## 2024-05-11 DIAGNOSIS — Z7984 Long term (current) use of oral hypoglycemic drugs: Secondary | ICD-10-CM

## 2024-05-11 DIAGNOSIS — E119 Type 2 diabetes mellitus without complications: Secondary | ICD-10-CM | POA: Diagnosis not present

## 2024-05-11 MED ORDER — QUETIAPINE FUMARATE 25 MG PO TABS
25.0000 mg | ORAL_TABLET | Freq: Every day | ORAL | 1 refills | Status: AC
  Filled 2024-05-11: qty 90, 90d supply, fill #0

## 2024-05-11 MED ORDER — NITROGLYCERIN 0.4 MG SL SUBL
0.4000 mg | SUBLINGUAL_TABLET | SUBLINGUAL | 1 refills | Status: AC | PRN
  Filled 2024-05-11: qty 75, 25d supply, fill #0

## 2024-05-11 MED ORDER — AMLODIPINE BESYLATE 10 MG PO TABS
10.0000 mg | ORAL_TABLET | Freq: Every day | ORAL | 1 refills | Status: AC
  Filled 2024-05-11: qty 90, 90d supply, fill #0

## 2024-05-11 MED ORDER — VITAMIN D3 50 MCG (2000 UT) PO CAPS
2000.0000 [IU] | ORAL_CAPSULE | Freq: Every day | ORAL | 1 refills | Status: AC
  Filled 2024-05-11: qty 90, 90d supply, fill #0

## 2024-05-11 MED ORDER — LOSARTAN POTASSIUM 25 MG PO TABS
25.0000 mg | ORAL_TABLET | Freq: Every day | ORAL | 3 refills | Status: AC
  Filled 2024-05-11: qty 90, 90d supply, fill #0

## 2024-05-11 MED ORDER — EMPAGLIFLOZIN 25 MG PO TABS
25.0000 mg | ORAL_TABLET | Freq: Every day | ORAL | 1 refills | Status: AC
  Filled 2024-05-11: qty 90, 90d supply, fill #0

## 2024-05-11 NOTE — Progress Notes (Signed)
 " [facility]  Scott Tucker, is a 58 y.o. male  RDW:[id number]  FMW:[id number]  DOB - 05/07/1966  Chief Complaint  Patient presents with   Hypertension   Hyperlipidemia       Subjective:   Scott Tucker is a 58 y.o. male here today for a follow up visit. HTN- elevated did not take medication because he is fasting.  Patient has No headache, No chest pain, No abdominal pain - No Nausea, No new weakness tingling or numbness, No Cough - shortness of breath T2D-Denies polyuria, polydipsia, polyphasia or vision changes.  Does not check blood sugars at home. Hyperlipidemia- labs increase risk MI/CVA.   No problems updated.  Comprehensive ROS Pertinent positive and negative noted in HPI   Allergies[1]  Past Medical History:  Diagnosis Date   Anginal pain    Anxiety    Arthritis    Chronic back pain    Coronary artery disease    Diabetes mellitus without complication (HCC)    type 2   Dyspnea    Headache    HTN (hypertension)    Neuromuscular disorder (HCC)     Medications Ordered Prior to Encounter[2] Health Maintenance  Topic Date Due   Eye exam for diabetics  Never done   Pneumococcal Vaccine for age over 38 (1 of 2 - PCV) Never done   Hepatitis B Vaccine (1 of 3 - 19+ 3-dose series) Never done   Zoster (Shingles) Vaccine (1 of 2) Never done   Colon Cancer Screening  Never done   COVID-19 Vaccine (3 - Pfizer risk series) 08/02/2020   Complete foot exam   10/26/2022   Hemoglobin A1C  03/18/2024   Medicare Annual Wellness Visit  06/10/2024   Flu Shot  08/10/2024*   Yearly kidney function blood test for diabetes  09/15/2024   Yearly kidney health urinalysis for diabetes  09/15/2024   DTaP/Tdap/Td vaccine (2 - Td or Tdap) 03/15/2029   Hepatitis C Screening  Completed   HIV Screening  Completed   HPV Vaccine  Aged Out   Meningitis B Vaccine  Aged Out  *Topic was postponed. The date shown is not the original due date.    Objective:   Vitals:    05/11/24 0900 05/11/24 0902 05/11/24 0939  BP: (!) 158/84 (!) 151/93 (!) 142/72  Pulse: 83    Resp: 16    SpO2: 99%    Weight: 196 lb 6.4 oz (89.1 kg)     BP Readings from Last 3 Encounters:  05/11/24 (!) 142/72  11/26/23 (!) 138/90  09/16/23 (!) 147/87      Assessment & Plan   Scott Tucker was seen today for hypertension and hyperlipidemia.  Diagnoses and all orders for this visit:  Mixed hyperlipidemia -     Lipid panel -  Insomnia due to medical condition -     QUEtiapine  (SEROQUEL ) 25 MG tablet; Take 1 tablet (25 mg total) by mouth at bedtime.  Medication refill -     empagliflozin  (JARDIANCE ) 25 MG TABS tablet; Take 1 tablet (25 mg total) by mouth once daily before breakfast. -     losartan  (COZAAR ) 25 MG tablet; Take 1 tablet (25 mg total) by mouth once daily. -     nitroGLYCERIN  (NITROSTAT ) 0.4 MG SL tablet; Place 1 tablet (0.4 mg total) under the tongue every 5 (five) minutes as needed for chest pain. -     amLODipine  (NORVASC ) 10 MG tablet; Take 1 tablet (10 mg total) by mouth once daily.  Type 2 diabetes mellitus without complication, without long-term current use of insulin  (HCC) -     empagliflozin  (JARDIANCE ) 25 MG TABS tablet; Take 1 tablet (25 mg total) by mouth once daily before breakfast. -     CBC with Differential/Platelet -     Hemoglobin A1c  Essential hypertension BP goal - < 130/80 Explained that having normal blood pressure is the goal and medications are helping to get to goal and maintain normal blood pressure. DIET: Limit salt intake, read nutrition labels to check salt content, limit fried and high fatty foods  Avoid using multisymptom OTC cold preparations that generally contain sudafed which can rise BP. Consult with pharmacist on best cold relief products to use for persons with HTN EXERCISE Discussed incorporating exercise such as walking - 30 minutes most days of the week and can do in 10 minute intervals    -     losartan  (COZAAR ) 25 MG tablet;  Take 1 tablet (25 mg total) by mouth once daily. -     amLODipine  (NORVASC ) 10 MG tablet; Take 1 tablet (10 mg total) by mouth once daily. -     CMP14+EGFR  Vitamin D  deficiency Your Vitamin D  is low. Vitamin D  is needed to make and keep bones strong.   -     Cholecalciferol  (VITAMIN D3) 50 MCG (2000 UT) capsule; Take 1 capsule (2,000 Units total) by mouth daily.  Nocturia -     PSA  Colon cancer screening -     Cologuard     Patient have been counseled extensively about nutrition and exercise. Other issues discussed during this visit include: low cholesterol diet, weight control and daily exercise, foot care, annual eye examinations at Ophthalmology, importance of adherence with medications and regular follow-up. We also discussed long term complications of uncontrolled diabetes and hypertension.  MAW  The patient was given clear instructions to go to ER or return to medical center if symptoms don't improve, worsen or new problems develop. The patient verbalized understanding. The patient was told to call to get lab results if they haven't heard anything in the next week.   This note has been created with Education officer, environmental. Any transcriptional errors are unintentional.   Scott SHAUNNA Bohr, NP 05/11/2024, 9:39 AM     [1]  Allergies Allergen Reactions   Latex Rash  [2]  Current Outpatient Medications on File Prior to Visit  Medication Sig Dispense Refill   aspirin  EC 81 MG tablet Take 1 tablet (81 mg total) by mouth daily. Swallow whole. 100 tablet 12   atorvastatin  (LIPITOR) 40 MG tablet Take 1 tablet (40 mg total) by mouth daily. 90 tablet 3   No current facility-administered medications on file prior to visit.   "

## 2024-05-14 LAB — CBC WITH DIFFERENTIAL/PLATELET
Basophils Absolute: 0.1 x10E3/uL (ref 0.0–0.2)
Basos: 1 %
EOS (ABSOLUTE): 0.2 x10E3/uL (ref 0.0–0.4)
Eos: 2 %
Hematocrit: 47.3 % (ref 37.5–51.0)
Hemoglobin: 15.5 g/dL (ref 13.0–17.7)
Immature Grans (Abs): 0 x10E3/uL (ref 0.0–0.1)
Immature Granulocytes: 0 %
Lymphocytes Absolute: 2.6 x10E3/uL (ref 0.7–3.1)
Lymphs: 29 %
MCH: 32.2 pg (ref 26.6–33.0)
MCHC: 32.8 g/dL (ref 31.5–35.7)
MCV: 98 fL — ABNORMAL HIGH (ref 79–97)
Monocytes Absolute: 0.6 x10E3/uL (ref 0.1–0.9)
Monocytes: 7 %
Neutrophils Absolute: 5.5 x10E3/uL (ref 1.4–7.0)
Neutrophils: 61 %
Platelets: 323 x10E3/uL (ref 150–450)
RBC: 4.82 x10E6/uL (ref 4.14–5.80)
RDW: 13.1 % (ref 11.6–15.4)
WBC: 8.9 x10E3/uL (ref 3.4–10.8)

## 2024-05-14 LAB — CMP14+EGFR
ALT: 19 IU/L (ref 0–44)
AST: 14 IU/L (ref 0–40)
Albumin: 4.5 g/dL (ref 3.8–4.9)
Alkaline Phosphatase: 105 IU/L (ref 47–123)
BUN/Creatinine Ratio: 14 (ref 9–20)
BUN: 9 mg/dL (ref 6–24)
Bilirubin Total: 0.4 mg/dL (ref 0.0–1.2)
CO2: 22 mmol/L (ref 20–29)
Calcium: 8.9 mg/dL (ref 8.7–10.2)
Chloride: 103 mmol/L (ref 96–106)
Creatinine, Ser: 0.63 mg/dL — ABNORMAL LOW (ref 0.76–1.27)
Globulin, Total: 2.4 g/dL (ref 1.5–4.5)
Glucose: 109 mg/dL — ABNORMAL HIGH (ref 70–99)
Potassium: 3.9 mmol/L (ref 3.5–5.2)
Sodium: 142 mmol/L (ref 134–144)
Total Protein: 6.9 g/dL (ref 6.0–8.5)
eGFR: 110 mL/min/1.73

## 2024-05-14 LAB — LIPID PANEL
Chol/HDL Ratio: 3 ratio (ref 0.0–5.0)
Cholesterol, Total: 139 mg/dL (ref 100–199)
HDL: 47 mg/dL
LDL Chol Calc (NIH): 80 mg/dL (ref 0–99)
Triglycerides: 59 mg/dL (ref 0–149)
VLDL Cholesterol Cal: 12 mg/dL (ref 5–40)

## 2024-05-14 LAB — PSA: Prostate Specific Ag, Serum: 1.8 ng/mL (ref 0.0–4.0)

## 2024-05-14 LAB — HEMOGLOBIN A1C
Est. average glucose Bld gHb Est-mCnc: 128 mg/dL
Hgb A1c MFr Bld: 6.1 % — ABNORMAL HIGH (ref 4.8–5.6)

## 2024-05-18 ENCOUNTER — Ambulatory Visit (INDEPENDENT_AMBULATORY_CARE_PROVIDER_SITE_OTHER): Payer: Self-pay | Admitting: Primary Care
